# Patient Record
Sex: Male | Born: 1945 | Race: Black or African American | Hispanic: No | Marital: Married | Smoking: Never smoker
Health system: Southern US, Community
[De-identification: ages and names within clinical notes are randomized; demographics above are authoritative.]

## PROBLEM LIST (undated history)

## (undated) DIAGNOSIS — J189 Pneumonia, unspecified organism: Secondary | ICD-10-CM

## (undated) DIAGNOSIS — I2510 Atherosclerotic heart disease of native coronary artery without angina pectoris: Secondary | ICD-10-CM

## (undated) DIAGNOSIS — Z8719 Personal history of other diseases of the digestive system: Secondary | ICD-10-CM

## (undated) DIAGNOSIS — I639 Cerebral infarction, unspecified: Secondary | ICD-10-CM

## (undated) DIAGNOSIS — H409 Unspecified glaucoma: Secondary | ICD-10-CM

## (undated) DIAGNOSIS — F4310 Post-traumatic stress disorder, unspecified: Secondary | ICD-10-CM

## (undated) DIAGNOSIS — K279 Peptic ulcer, site unspecified, unspecified as acute or chronic, without hemorrhage or perforation: Secondary | ICD-10-CM

## (undated) DIAGNOSIS — M1990 Unspecified osteoarthritis, unspecified site: Secondary | ICD-10-CM

## (undated) DIAGNOSIS — F99 Mental disorder, not otherwise specified: Secondary | ICD-10-CM

## (undated) DIAGNOSIS — G4700 Insomnia, unspecified: Secondary | ICD-10-CM

## (undated) DIAGNOSIS — G4730 Sleep apnea, unspecified: Secondary | ICD-10-CM

## (undated) DIAGNOSIS — D869 Sarcoidosis, unspecified: Secondary | ICD-10-CM

## (undated) DIAGNOSIS — H15009 Unspecified scleritis, unspecified eye: Secondary | ICD-10-CM

## (undated) DIAGNOSIS — R7303 Prediabetes: Secondary | ICD-10-CM

## (undated) DIAGNOSIS — D369 Benign neoplasm, unspecified site: Secondary | ICD-10-CM

## (undated) DIAGNOSIS — I10 Essential (primary) hypertension: Secondary | ICD-10-CM

## (undated) HISTORY — DX: Unspecified osteoarthritis, unspecified site: M19.90

## (undated) HISTORY — DX: Sarcoidosis, unspecified: D86.9

## (undated) HISTORY — DX: Unspecified scleritis, unspecified eye: H15.009

## (undated) HISTORY — DX: Benign neoplasm, unspecified site: D36.9

## (undated) HISTORY — PX: CARDIAC CATHETERIZATION: SHX172

## (undated) HISTORY — DX: Personal history of other diseases of the digestive system: Z87.19

## (undated) HISTORY — DX: Insomnia, unspecified: G47.00

## (undated) HISTORY — DX: Peptic ulcer, site unspecified, unspecified as acute or chronic, without hemorrhage or perforation: K27.9

## (undated) HISTORY — DX: Post-traumatic stress disorder, unspecified: F43.10

---

## 1999-11-24 ENCOUNTER — Emergency Department (HOSPITAL_COMMUNITY): Admission: EM | Admit: 1999-11-24 | Discharge: 1999-11-24 | Payer: Self-pay

## 2001-01-09 DIAGNOSIS — Z8719 Personal history of other diseases of the digestive system: Secondary | ICD-10-CM

## 2001-01-09 HISTORY — DX: Personal history of other diseases of the digestive system: Z87.19

## 2001-01-26 ENCOUNTER — Ambulatory Visit (HOSPITAL_COMMUNITY): Admission: RE | Admit: 2001-01-26 | Discharge: 2001-01-26 | Payer: Self-pay | Admitting: Gastroenterology

## 2002-03-28 ENCOUNTER — Ambulatory Visit (HOSPITAL_COMMUNITY): Admission: RE | Admit: 2002-03-28 | Discharge: 2002-03-28 | Payer: Self-pay | Admitting: Internal Medicine

## 2002-06-22 ENCOUNTER — Encounter: Admission: RE | Admit: 2002-06-22 | Discharge: 2002-06-22 | Payer: Self-pay | Admitting: Rheumatology

## 2002-06-22 ENCOUNTER — Encounter: Payer: Self-pay | Admitting: Rheumatology

## 2002-10-11 DIAGNOSIS — D369 Benign neoplasm, unspecified site: Secondary | ICD-10-CM

## 2002-10-11 HISTORY — DX: Benign neoplasm, unspecified site: D36.9

## 2002-11-13 ENCOUNTER — Encounter: Payer: Self-pay | Admitting: Ophthalmology

## 2002-11-16 ENCOUNTER — Ambulatory Visit (HOSPITAL_COMMUNITY): Admission: RE | Admit: 2002-11-16 | Discharge: 2002-12-01 | Payer: Self-pay | Admitting: Ophthalmology

## 2002-11-16 ENCOUNTER — Encounter (INDEPENDENT_AMBULATORY_CARE_PROVIDER_SITE_OTHER): Payer: Self-pay | Admitting: Specialist

## 2003-07-31 ENCOUNTER — Encounter (INDEPENDENT_AMBULATORY_CARE_PROVIDER_SITE_OTHER): Payer: Self-pay | Admitting: Specialist

## 2003-07-31 ENCOUNTER — Ambulatory Visit (HOSPITAL_COMMUNITY): Admission: RE | Admit: 2003-07-31 | Discharge: 2003-07-31 | Payer: Self-pay | Admitting: Gastroenterology

## 2003-10-12 HISTORY — PX: EYE SURGERY: SHX253

## 2008-09-19 ENCOUNTER — Encounter: Admission: RE | Admit: 2008-09-19 | Discharge: 2008-09-19 | Payer: Self-pay | Admitting: Rheumatology

## 2009-10-07 ENCOUNTER — Encounter: Admission: RE | Admit: 2009-10-07 | Discharge: 2009-10-07 | Payer: Self-pay | Admitting: Rheumatology

## 2010-11-01 ENCOUNTER — Encounter: Payer: Self-pay | Admitting: Rheumatology

## 2011-02-08 ENCOUNTER — Encounter (HOSPITAL_BASED_OUTPATIENT_CLINIC_OR_DEPARTMENT_OTHER)
Admission: RE | Admit: 2011-02-08 | Discharge: 2011-02-08 | Disposition: A | Discharge status: Home / Self Care | Payer: BC Managed Care – PPO | Source: Ambulatory Visit | Attending: Orthopaedic Surgery | Admitting: Orthopaedic Surgery

## 2011-02-08 LAB — BASIC METABOLIC PANEL
BUN: 8 mg/dL (ref 6–23)
Calcium: 9.5 mg/dL (ref 8.4–10.5)
Chloride: 103 mEq/L (ref 96–112)
Creatinine, Ser: 0.64 mg/dL (ref 0.4–1.5)
GFR calc Af Amer: >60 mL/min (ref >60)
GFR calc non Af Amer: >60 mL/min (ref >60)
Glucose, Bld: 82 mg/dL (ref 70–99)
Potassium: 4.1 mEq/L (ref 3.5–5.1)
Sodium: 137 mEq/L (ref 135–145)

## 2011-02-09 ENCOUNTER — Ambulatory Visit (HOSPITAL_BASED_OUTPATIENT_CLINIC_OR_DEPARTMENT_OTHER)
Admission: RE | Admit: 2011-02-09 | Discharge: 2011-02-09 | Disposition: A | Discharge status: Home / Self Care | Payer: BC Managed Care – PPO | Source: Ambulatory Visit | Attending: Orthopaedic Surgery | Admitting: Orthopaedic Surgery

## 2011-02-09 DIAGNOSIS — E119 Type 2 diabetes mellitus without complications: Secondary | ICD-10-CM | POA: Insufficient documentation

## 2011-02-09 DIAGNOSIS — G4733 Obstructive sleep apnea (adult) (pediatric): Secondary | ICD-10-CM | POA: Insufficient documentation

## 2011-02-09 DIAGNOSIS — M2240 Chondromalacia patellae, unspecified knee: Secondary | ICD-10-CM | POA: Insufficient documentation

## 2011-02-09 DIAGNOSIS — Z01812 Encounter for preprocedural laboratory examination: Secondary | ICD-10-CM | POA: Insufficient documentation

## 2011-02-09 DIAGNOSIS — M129 Arthropathy, unspecified: Secondary | ICD-10-CM | POA: Insufficient documentation

## 2011-02-09 DIAGNOSIS — I10 Essential (primary) hypertension: Secondary | ICD-10-CM | POA: Insufficient documentation

## 2011-02-09 DIAGNOSIS — M23305 Other meniscus derangements, unspecified medial meniscus, unspecified knee: Secondary | ICD-10-CM | POA: Insufficient documentation

## 2011-02-09 DIAGNOSIS — Z01810 Encounter for preprocedural cardiovascular examination: Secondary | ICD-10-CM | POA: Insufficient documentation

## 2011-02-09 LAB — GLUCOSE, CAPILLARY: Glucose-Capillary: 103 mg/dL — ABNORMAL HIGH (ref 70–99)

## 2011-02-21 NOTE — Op Note (Signed)
[name], Derek [name] NO.:  [id number]  MEDICAL RECORD NO.:  [id number]         PATIENT TYPE:  CAMB  LOCATION:                                 FACILITY:  PHYSICIAN:  Lubertha Basque. Jennika Ringgold, M.D.DATE OF BIRTH:  06/05/1946  DATE OF PROCEDURE:  02/09/2011 DATE OF DISCHARGE:  02/09/2011                              OPERATIVE REPORT   PREOPERATIVE DIAGNOSES: 1. Left knee torn lateral meniscus. 2. Left knee chondromalacia.  POSTOPERATIVE DIAGNOSES: 1. Left knee torn lateral meniscus. 2. Left knee chondromalacia.  PROCEDURE: 1. Left knee partial lateral meniscectomy. 2. Left knee abrasion, chondroplasty medial. 3. Left knee synovectomy.  ANESTHESIA:  General and block.  ATTENDING SURGEON:  Lubertha Basque. Jerl Santos, MD  ASSISTANT:  Lindwood Qua, PA   INDICATIONS FOR PROCEDURE:  The patient is a 65 year old man with seronegative rheumatoid arthritis.  He has been aspirated and injected many times by his rheumatologist.  He persists with an effusion.  He has an MRI on record from several years back which does not show any meniscal injuries which shows things consistent with his underlying condition.  He has mechanical catching in his knee which locks and catches.  He has pain which limits his ability to walk and rest and at this point he is offered an arthroscopy.  Informed operative consent was obtained after discussion of possible complications including reaction to anesthesia and infection.  SUMMARY/FINDINGS/PROCEDURE:  Under general anesthesia and a block, a left knee arthroscopy was performed.  The suprapatellar pouch was benign except for an abundant collection of hypertrophied synovium.  He had some small cartilaginous loose bodies.  The patellofemoral joint looked relatively benign.  In the medial compartment, he had no evidence of meniscal injury, but did have some chondromalacia, grade 4 and a dime- sized area near the notch and a chondroplasty with  abrasion to bleeding bone in one tiny area was done.  The ACL was intact.  Lateral compartment exhibited a middle horn lateral meniscus tear addressed with 5% partial lateral meniscectomy.  He also had some grade 3 changes across most of the femur.  Again, there were areas of synovial hypertrophy throughout the joint and thorough synovectomy was done.  DESCRIPTION OF PROCEDURE:  The patient was taken to the operating suite where general anesthetic was applied without difficulty.  He was also given a block in the preanesthesia area.  He was positioned supine and prepped and draped in normal sterile fashion.  After administration of IV Kefzol, an arthroscopy of the left knee was performed through total of two portals.  Findings were as noted above.  Procedure consisted of the extensive synovectomy followed by the partial and lateral meniscectomy done with a basket and shaver in the lateral compartment. We then performed the abrasion chondroplasty medial as outlined above. The knee was thoroughly irrigated followed by placement of Marcaine with epinephrine and morphine plus some Depo-Medrol.  Adaptic was applied over the portals followed by dry gauze and loose Ace wrap.  Estimated blood loss and intraoperative fluids can be obtained from anesthesia records.  DISPOSITION:  The patient was extubated in the operating room and taken to recovery room in  stable addition.  He was to go home same-day and follow up in the office closely.  I will contact him by phone tonight.     Lubertha Basque Jerl Santos, M.D.     PGD/MEDQ  D:  02/09/2011  T:  02/09/2011  Job:  [id number]  Electronically Signed by Marcene Corning M.D. on 02/21/2011 11:53:23 AM

## 2011-02-26 NOTE — Procedures (Signed)
[facility]  Patient:    Derek [name], Derek [name]                   MRN: [id number] Proc. Date: 01/26/01 Attending:  Verlin Grills, M.D. CC:         Thora Lance, M.D.   Procedure Report  PROCEDURE:  Esophagogastroduodenoscopy.  REFERRING PHYSICIAN:  Dr. Kirby Funk.  INDICATION FOR PROCEDURE:  Mr. Derek [name] is a 65 year old male. He has a history of peptic ulcer disease in the past. He presented to Dr. Sherlynn Carbon office yesterday with epigastric dyspeptic type symptoms and passing melenic appearing stool. Mr. Tarte was taking nonsteroidal anti-inflammatory medication which has been stopped.  I discussed with the patient the complications associated with esophagogastroduodenoscopy including intestinal bleeding and intestinal perforation. The patient has signed the operative permit.  ENDOSCOPIST:  Verlin Grills, M.D.  PREMEDICATION:  Demerol 50 mg, Versed 7.5 mg.  ENDOSCOPE:  Olympus gastroscope.  DESCRIPTION OF PROCEDURE:  After obtaining informed consent, the patient was placed in the left lateral decubitus position. I administered intravenous Demerol and intravenous Versed to achieve conscious sedation for the procedure. The patients cardiac rhythm, oxygen saturation and blood pressure were monitored throughout the procedure and documented in the medical record. The Olympus video gastroscope was passed through the posterior hypopharynx into the proximal esophagus without difficulty. The hypopharynx, larynx, and vocal chords appeared normal.  ESOPHAGOSCOPY:  The proximal, mid and lower segments of the esophagus appeared normal.  GASTROSCOPY:  Retroflexed view of the gastric cardia and fundus was normal. The gastric body, antrum and pylorus appeared normal.  DUODENOSCOPY:  There is marked edema of the distal duodenal bulb. There is a 2 mm x 2 mm duodenal bulb ulcer in the distal duodenum with exudative base  and no visible vessel. The mid-distal duodenum appeared normal.  A biopsy was taken from the distal gastric antrum for CLOtest to rule out Helicobacter pylori antral gastritis.  ASSESSMENT:  Small duodenal bulb ulcer with exudative base probably secondary to nonsteroidal anti-inflammatory medications. CLOtest to rule out Helicobacter pylori antral gastritis pending.  RECOMMENDATIONS:  Begin proton pump inhibitor of choice to be used daily for 30 days to heal the duodenal ulcer. DD:  01/26/01 TD:  01/27/01 Job: [id number]

## 2011-02-26 NOTE — Op Note (Signed)
   [name], [name]                     ACCOUNT NO.:  [id number]   MEDICAL RECORD NO.:  [id number]                   PATIENT TYPE:  AMB   LOCATION:  ENDO                                 FACILITY:  [facility]   PHYSICIAN:  Danise Edge, M.D.                DATE OF BIRTH:  Apr 08, 1946   DATE OF PROCEDURE:  07/31/2003  DATE OF DISCHARGE:                                 OPERATIVE REPORT   PROCEDURE PERFORMED:  Colonoscopy and sigmoid polypectomy.   ENDOSCOPIST:  Charolett Bumpers, M.D.   INDICATIONS FOR PROCEDURE:  Mr. Derek [name] is a 65 year old male born  March 31, 1943.  Mr. Derek [name] is undergoing diagnostic to evaluate  intermittent hematochezia.   PREMEDICATION:  Versed 5 mg.  Demerol 50 mg.   DESCRIPTION OF PROCEDURE:  After obtaining informed consent, the patient was  placed in the left lateral decubitus position.  I administered intravenous  Demerol and intravenous Versed to achieve conscious sedation for the  procedure.  The patient's blood pressure, oxygen saturations and cardiac  rhythm were monitored throughout the procedure and documented in the medical  record.   Anal inspection was normal.  Digital rectal exam was normal.  I was unable  to examine the prostate.  The pediatric adjustable Olympus video colonoscope  was introduced into the rectum and easily advanced to cecum.  Colonic  preparation for the exam today was excellent.   Rectum:  Normal.   Sigmoid colon and descending colon:  At 40 cm from the anal verge, a 2 mm  sessile polyp was removed with the hot biopsy forceps.   Splenic flexure:  Normal.   Transverse colon:  Normal.   Hepatic flexure:  Normal.   Ascending colon:  Normal.   Cecum and ileocecal valve:  Normal.   ASSESSMENT:  A small polyp was removed from the distal sigmoid colon at 40  cm from the anal verge and submitted for pathological interpretation;  otherwise normal proctocolonoscopy to the cecum.    RECOMMENDATIONS:  If  sigmoid polyp returns neoplastic pathologically, Mr.  Derek [name] should undergo a repeat colonoscopy in five years.                                                Danise Edge, M.D.    MJ/MEDQ  D:  07/31/2003  T:  07/31/2003  Job:  [id number]   cc:   Thora Lance, M.D.  [address]  [place]  [place] [address]  Fax: [phone]

## 2011-02-26 NOTE — Op Note (Signed)
   Derek [name], Derek [name]                     ACCOUNT NO.:  [id number]   MEDICAL RECORD NO.:  [id number]                   PATIENT TYPE:  OIB   LOCATION:  NA                                   FACILITY:  [facility]   PHYSICIAN:  Salley Scarlet., M.D.         DATE OF BIRTH:  07-16-1946   DATE OF PROCEDURE:  DATE OF DISCHARGE:                                 OPERATIVE REPORT   PREOPERATIVE DIAGNOSIS:  Pterygium, cornea, left eye.   POSTOPERATIVE DIAGNOSIS:  Pterygium, cornea, left eye.   PROCEDURE:  Pterygium excision.   ANESTHESIA:  Local using Xylocaine 2% and Marcaine 0.75% and [place].   INDICATION FOR PROCEDURE:  This is a 65 year old gentleman who has chronic  irritation of the left eye.  He has a pterygium which stays chronically  inflamed and which encroaches onto approximately 3-4 mm of the cornea of the  left eye.  The patient requested that the pterygium be excised in hopes of  relieving some of the redness and irritation.  The patient is therefore  admitted at this time for that purpose.   DESCRIPTION OF PROCEDURE:  Under the influence of IV sedation, a Van Lint  akinesia and retrobulbar anesthesia were given.  The patient was prepped and  draped in the usual manner.  The lid speculum was then inserted under the  upper and lower lid of the left eye.  The head of the pterygium was  infiltrated with several cubic centimeters of the Xylocaine mixture.  Then  using sharp and blunt dissection, a curvilinear incision was made in the  cornea anterior to the incision of the pterygium and the pterygium was  excised in toto without difficulty.  The episcleral tissue was cauterized so  as to achieve hemostasis.  The conjunctiva was then closed using two  interrupted sutures of 8-0 Vicryl in such a fashion as to leave the bare  sclera immediately adjacent to the cornea.  Maxitrol ophthalmic ointment and  a pressure patch were applied.  The patient tolerated the procedure well  and  was discharged to the postanesthesia recovery room in satisfactory  condition.  He is instructed to rest today, to take Vicodin every four hours  as needed for pain, and to see me in the office tomorrow for further  evaluation.   DISCHARGE DIAGNOSIS:  Pterygium of cornea, left eye.                                               Salley Scarlet., M.D.    TB/MEDQ  D:  11/17/2002  T:  11/17/2002  Job:  [id number]

## 2012-05-05 ENCOUNTER — Other Ambulatory Visit: Payer: Self-pay | Admitting: Orthopaedic Surgery

## 2012-05-30 ENCOUNTER — Encounter (HOSPITAL_COMMUNITY): Payer: Self-pay | Admitting: Respiratory Therapy

## 2012-06-06 ENCOUNTER — Encounter (HOSPITAL_COMMUNITY)
Admission: RE | Admit: 2012-06-06 | Discharge: 2012-06-06 | Disposition: A | Discharge status: Home / Self Care | Payer: BC Managed Care – PPO | Source: Ambulatory Visit | Attending: Orthopaedic Surgery | Admitting: Orthopaedic Surgery

## 2012-06-06 ENCOUNTER — Encounter (HOSPITAL_COMMUNITY): Payer: Self-pay

## 2012-06-06 HISTORY — DX: Sleep apnea, unspecified: G47.30

## 2012-06-06 HISTORY — DX: Essential (primary) hypertension: I10

## 2012-06-06 HISTORY — DX: Unspecified osteoarthritis, unspecified site: M19.90

## 2012-06-06 LAB — URINALYSIS, ROUTINE W REFLEX MICROSCOPIC
Bilirubin Urine: NEGATIVE
Glucose, UA: NEGATIVE mg/dL
Hgb urine dipstick: NEGATIVE
Specific Gravity, Urine: 1.023 (ref 1.005–1.030)
Urobilinogen, UA: 1 mg/dL (ref 0.0–1.0)
pH: 5 (ref 5.0–8.0)

## 2012-06-06 LAB — CBC WITH DIFFERENTIAL/PLATELET
Eosinophils Absolute: 0.3 10*3/uL (ref 0.0–0.7)
Eosinophils Relative: 4 % (ref 0–5)
HCT: 42 % (ref 39.0–52.0)
Hemoglobin: 14.5 g/dL (ref 13.0–17.0)
Lymphs Abs: 2.5 10*3/uL (ref 0.7–4.0)
MCH: 33.3 pg (ref 26.0–34.0)
MCV: 96.6 fL (ref 78.0–100.0)
Monocytes Absolute: 0.6 10*3/uL (ref 0.1–1.0)
Monocytes Relative: 6 % (ref 3–12)
RBC: 4.35 MIL/uL (ref 4.22–5.81)

## 2012-06-06 LAB — APTT: aPTT: 31 seconds (ref 24–37)

## 2012-06-06 LAB — BASIC METABOLIC PANEL
Chloride: 100 mEq/L (ref 96–112)
Creatinine, Ser: 0.57 mg/dL (ref 0.50–1.35)
GFR calc Af Amer: >90 mL/min (ref >90)
Potassium: 3.8 mEq/L (ref 3.5–5.1)

## 2012-06-06 LAB — PROTIME-INR: Prothrombin Time: 13.7 seconds (ref 11.6–15.2)

## 2012-06-06 LAB — TYPE AND SCREEN: Antibody Screen: NEGATIVE

## 2012-06-06 MED ORDER — CHLORHEXIDINE GLUCONATE 4 % EX LIQD: 60.0000 mL | Freq: Once | CUTANEOUS | Status: DC

## 2012-06-06 NOTE — Pre-Procedure Instructions (Addendum)
[address] BRIYAN KLEVEN  06/06/2012   Your procedure is scheduled on:  06/13/12 ** Report to [facility] at 1030 AM.  Call this number if you have problems the morning of surgery: [phone]   Remember:   Do not eat food or drink:After Midnight.    Take these medicines the morning of surgery with A SIP OF WATER: tylenol. Allopurinol, prednisone, prazosin  STOP fish oil, plaqueni 8/29l,   Do not wear jewelry, make-up or nail polish.  Do not wear lotions, powders, or perfumes. You may wear deodorant.  Do not shave 48 hours prior to surgery. Men may shave face and neck.  Do not bring valuables to the hospital.  Contacts, dentures or bridgework may not be worn into surgery.  Leave suitcase in the car. After surgery it may be brought to your room.  For patients admitted to the hospital, checkout time is 11:00 AM the day of discharge.   Patients discharged the day of surgery will not be allowed to drive home.  Name and phone number of your driver: annie spouse [phone]  Special Instructions: Incentive Spirometry - Practice and bring it with you on the day of surgery. and CHG Shower Use Special Wash: 1/2 bottle night before surgery and 1/2 bottle morning of surgery.   Please read over the following fact sheets that you were given: Pain Booklet, Coughing and Deep Breathing, Blood Transfusion Information, MRSA Information and Surgical Site Infection Prevention, total joint packet

## 2012-06-06 NOTE — Progress Notes (Signed)
preop ekg, ? Echo done and req'd from dr Jonny Ruiz griffin

## 2012-06-07 NOTE — Consult Note (Signed)
Anesthesia chart review: Patient is a 66 year old male scheduled for right hip arthoplasty on 06/13/12 by Dr. Jerl Santos.  History includes HTN, OSA, DM2 (A1C on 05/23/12 was 5.7), RA, current BMI 30.  PCP is Dr. Kirby Funk.  Patient was just seen for a CPE on 05/23/12.    EKG on 05/23/12 [facility]) showed NSR, LAE, inferior and anteroseptal infarct (age undetermined).  Overall, it appears stable since at least 02/08/11.  Following patient's EKG, Dr. Valentina Lucks ordered an echo that was done on 05/25/12 and showed mild LVH, EF 60-65%, borderline left atrial enlargement, mild MR, aortic valve sclerosis but opened well, mild TR, mild pulmonary valve regurgitation, findings suggestive of normal diastolic dysfunction without elevated left atrial pressure.  CXR on 06/06/12 showed no acute disease.  Labs noted.    Anticipate he can proceed as planned.  Shonna Chock, PA-C

## 2012-06-10 NOTE — H&P (Signed)
Derek [name] is an 65 y.o. male.   Chief Complaint: right hip pain HPI: Derek [name] is suffering from terrible right hip pain for multiple months duration.  Last year he received an injection of cortisone which helped him only for short while.  His right hip pain his back despite trying oral anti-inflammatory medicines.  He did have to stop oral anti-inflammatory medicines due to gastric ulcer.  He is in constant and severe pain in things are getting worse.  He is unable to walk without severe pain.  X-rays reveal significant degeneration bone-on-bone arthritis of the right hip.  We have discussed with him proceeding with a right hip replacement to help relieve pain and increased function.  Past Medical History  Diagnosis Date  . Hypertension   . Diabetes mellitus   . Sleep apnea     cpap with O2 4L for 4 yrs  . Arthritis     Past Surgical History  Procedure Date  . Eye surgery 05    growth lft    No family history on file. Social History:  reports that he has never smoked. He does not have any smokeless tobacco history on file. He reports that he drinks alcohol. He reports that he does not use illicit drugs.  Allergies: No Known Allergies  No prescriptions prior to admission  medications: Oral antidiabetic agents, allopurinol, lithium  No results found for this or any previous visit (from the past 48 hour(s)). No results found.  Review of Systems  Constitutional: Negative.   HENT: Negative.   Eyes: Negative.   Respiratory: Negative.   Cardiovascular: Negative.        Hypertension  Gastrointestinal: Negative.   Genitourinary: Negative.   Musculoskeletal: Negative.        History of gout  Skin: Negative.   Neurological: Negative.   Endo/Heme/Allergies: Negative.        Diabetes  Psychiatric/Behavioral: Negative.     There were no vitals taken for this visit. Physical Exam  Constitutional: He appears well-nourished.  HENT:  Head: Atraumatic.  Eyes: EOM are  normal.  Neck: Neck supple.  Cardiovascular: Normal rate.   Respiratory: Effort normal.  GI: Soft.  Musculoskeletal:       Right hip motion is extremely painful and limited.  Leg lengths grossly equal.  Straight leg raising causes mild low back pain.  Sensory and motor function are normal.  He has very limited rotation right hip.  Neurological: He is alert.  Skin: Skin is warm.  Psychiatric: He has a normal mood and affect.     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  We have also discussed his hospitalization and need for postoperative physical therapy.  Aliyha Fornes R 06/10/2012, 1:54 PM

## 2012-06-12 MED ORDER — CEFAZOLIN SODIUM-DEXTROSE 2-3 GM-% IV SOLR
2.0000 g | INTRAVENOUS | Status: AC
  Administered 2012-06-13: 2 g via INTRAVENOUS
  Filled 2012-06-12: qty 50

## 2012-06-13 ENCOUNTER — Ambulatory Visit (HOSPITAL_COMMUNITY): Payer: BC Managed Care – PPO

## 2012-06-13 ENCOUNTER — Encounter (HOSPITAL_COMMUNITY): Payer: Self-pay | Admitting: Vascular Surgery

## 2012-06-13 ENCOUNTER — Inpatient Hospital Stay (HOSPITAL_COMMUNITY)
Admission: RE | Admit: 2012-06-13 | Discharge: 2012-06-15 | DRG: 818 | Disposition: A | Discharge status: Home Health | Payer: BC Managed Care – PPO | Source: Ambulatory Visit | Attending: Orthopaedic Surgery | Admitting: Orthopaedic Surgery

## 2012-06-13 ENCOUNTER — Encounter (HOSPITAL_COMMUNITY)
Admission: RE | Disposition: A | Discharge status: Home Health | Payer: Self-pay | Source: Ambulatory Visit | Attending: Orthopaedic Surgery

## 2012-06-13 ENCOUNTER — Inpatient Hospital Stay (HOSPITAL_COMMUNITY): Payer: BC Managed Care – PPO

## 2012-06-13 ENCOUNTER — Ambulatory Visit (HOSPITAL_COMMUNITY): Payer: BC Managed Care – PPO | Admitting: Vascular Surgery

## 2012-06-13 DIAGNOSIS — Z79899 Other long term (current) drug therapy: Secondary | ICD-10-CM

## 2012-06-13 DIAGNOSIS — Z01812 Encounter for preprocedural laboratory examination: Secondary | ICD-10-CM

## 2012-06-13 DIAGNOSIS — Z01818 Encounter for other preprocedural examination: Secondary | ICD-10-CM

## 2012-06-13 DIAGNOSIS — M109 Gout, unspecified: Secondary | ICD-10-CM | POA: Diagnosis present

## 2012-06-13 DIAGNOSIS — I10 Essential (primary) hypertension: Secondary | ICD-10-CM | POA: Diagnosis present

## 2012-06-13 DIAGNOSIS — Z01811 Encounter for preprocedural respiratory examination: Secondary | ICD-10-CM

## 2012-06-13 DIAGNOSIS — E119 Type 2 diabetes mellitus without complications: Secondary | ICD-10-CM | POA: Diagnosis present

## 2012-06-13 DIAGNOSIS — F4310 Post-traumatic stress disorder, unspecified: Secondary | ICD-10-CM | POA: Diagnosis present

## 2012-06-13 DIAGNOSIS — M169 Osteoarthritis of hip, unspecified: Principal | ICD-10-CM | POA: Diagnosis present

## 2012-06-13 DIAGNOSIS — M1610 Unilateral primary osteoarthritis, unspecified hip: Principal | ICD-10-CM | POA: Diagnosis present

## 2012-06-13 DIAGNOSIS — G4733 Obstructive sleep apnea (adult) (pediatric): Secondary | ICD-10-CM | POA: Diagnosis present

## 2012-06-13 DIAGNOSIS — Z7982 Long term (current) use of aspirin: Secondary | ICD-10-CM

## 2012-06-13 HISTORY — DX: Mental disorder, not otherwise specified: F99

## 2012-06-13 HISTORY — PX: TOTAL HIP ARTHROPLASTY: SHX124

## 2012-06-13 LAB — GLUCOSE, CAPILLARY
Glucose-Capillary: 118 mg/dL — ABNORMAL HIGH (ref 70–99)
Glucose-Capillary: 121 mg/dL — ABNORMAL HIGH (ref 70–99)

## 2012-06-13 SURGERY — ARTHROPLASTY, HIP, TOTAL, ANTERIOR APPROACH
Anesthesia: General | Site: Hip | Laterality: Right | Wound class: Clean

## 2012-06-13 MED ORDER — LACTATED RINGERS IV SOLN
INTRAVENOUS | Status: DC
  Administered 2012-06-13: 12:00:00 via INTRAVENOUS

## 2012-06-13 MED ORDER — PRAZOSIN HCL 2 MG PO CAPS
2.0000 mg | ORAL_CAPSULE | Freq: Every day | ORAL | Status: DC
  Administered 2012-06-13 – 2012-06-14 (×2): 2 mg via ORAL
  Filled 2012-06-13 (×3): qty 1

## 2012-06-13 MED ORDER — METOCLOPRAMIDE HCL 10 MG PO TABS: 5.0000 mg | ORAL_TABLET | Freq: Three times a day (TID) | ORAL | Status: DC | PRN

## 2012-06-13 MED ORDER — METOCLOPRAMIDE HCL 5 MG/ML IJ SOLN: 5.0000 mg | Freq: Three times a day (TID) | INTRAMUSCULAR | Status: DC | PRN

## 2012-06-13 MED ORDER — MIDAZOLAM HCL 5 MG/5ML IJ SOLN
INTRAMUSCULAR | Status: DC | PRN
  Administered 2012-06-13: 2 mg via INTRAVENOUS

## 2012-06-13 MED ORDER — 0.9 % SODIUM CHLORIDE (POUR BTL) OPTIME
TOPICAL | Status: DC | PRN
  Administered 2012-06-13: 1000 mL

## 2012-06-13 MED ORDER — LIDOCAINE HCL (CARDIAC) 20 MG/ML IV SOLN
INTRAVENOUS | Status: DC | PRN
  Administered 2012-06-13: 100 mg via INTRAVENOUS

## 2012-06-13 MED ORDER — PHENYLEPHRINE HCL 10 MG/ML IJ SOLN
INTRAMUSCULAR | Status: DC | PRN
  Administered 2012-06-13: 40 ug via INTRAVENOUS
  Administered 2012-06-13 (×2): 80 ug via INTRAVENOUS

## 2012-06-13 MED ORDER — LACTATED RINGERS IV SOLN
INTRAVENOUS | Status: DC | PRN
  Administered 2012-06-13 (×3): via INTRAVENOUS

## 2012-06-13 MED ORDER — MIDAZOLAM HCL 2 MG/2ML IJ SOLN: 1.0000 mg | INTRAMUSCULAR | Status: DC | PRN

## 2012-06-13 MED ORDER — INSULIN ASPART 100 UNIT/ML ~~LOC~~ SOLN: 0.0000 [IU] | Freq: Every day | SUBCUTANEOUS | Status: DC

## 2012-06-13 MED ORDER — HYDROXYCHLOROQUINE SULFATE 200 MG PO TABS
400.0000 mg | ORAL_TABLET | Freq: Every day | ORAL | Status: DC
  Administered 2012-06-14 – 2012-06-15 (×2): 400 mg via ORAL
  Filled 2012-06-13 (×2): qty 2

## 2012-06-13 MED ORDER — EPHEDRINE SULFATE 50 MG/ML IJ SOLN
INTRAMUSCULAR | Status: DC | PRN
  Administered 2012-06-13: 10 mg via INTRAVENOUS

## 2012-06-13 MED ORDER — ONDANSETRON HCL 4 MG/2ML IJ SOLN
INTRAMUSCULAR | Status: DC | PRN
  Administered 2012-06-13: 4 mg via INTRAVENOUS

## 2012-06-13 MED ORDER — NEOSTIGMINE METHYLSULFATE 1 MG/ML IJ SOLN
INTRAMUSCULAR | Status: DC | PRN
  Administered 2012-06-13: 4 mg via INTRAVENOUS

## 2012-06-13 MED ORDER — ONDANSETRON HCL 4 MG/2ML IJ SOLN: 4.0000 mg | Freq: Four times a day (QID) | INTRAMUSCULAR | Status: DC | PRN

## 2012-06-13 MED ORDER — PREDNISONE 5 MG PO TABS
5.0000 mg | ORAL_TABLET | Freq: Every day | ORAL | Status: DC
  Administered 2012-06-14 – 2012-06-15 (×2): 5 mg via ORAL
  Filled 2012-06-13 (×3): qty 1

## 2012-06-13 MED ORDER — FUROSEMIDE 20 MG PO TABS
20.0000 mg | ORAL_TABLET | Freq: Every day | ORAL | Status: DC
  Administered 2012-06-14 – 2012-06-15 (×2): 20 mg via ORAL
  Filled 2012-06-13 (×2): qty 1

## 2012-06-13 MED ORDER — LISINOPRIL 10 MG PO TABS
10.0000 mg | ORAL_TABLET | Freq: Every day | ORAL | Status: DC
  Administered 2012-06-14 – 2012-06-15 (×2): 10 mg via ORAL
  Filled 2012-06-13 (×2): qty 1

## 2012-06-13 MED ORDER — GLIMEPIRIDE 1 MG PO TABS
0.5000 mg | ORAL_TABLET | Freq: Every day | ORAL | Status: DC
  Administered 2012-06-14 – 2012-06-15 (×2): 0.5 mg via ORAL
  Filled 2012-06-13 (×3): qty 0.5

## 2012-06-13 MED ORDER — CEFAZOLIN SODIUM-DEXTROSE 2-3 GM-% IV SOLR
2.0000 g | Freq: Four times a day (QID) | INTRAVENOUS | Status: AC
  Administered 2012-06-13 (×2): 2 g via INTRAVENOUS
  Filled 2012-06-13 (×2): qty 50

## 2012-06-13 MED ORDER — VECURONIUM BROMIDE 10 MG IV SOLR
INTRAVENOUS | Status: DC | PRN
  Administered 2012-06-13: 1 mg via INTRAVENOUS
  Administered 2012-06-13: 2 mg via INTRAVENOUS
  Administered 2012-06-13: 1 mg via INTRAVENOUS
  Administered 2012-06-13: 2 mg via INTRAVENOUS
  Administered 2012-06-13: 1 mg via INTRAVENOUS

## 2012-06-13 MED ORDER — INSULIN ASPART 100 UNIT/ML ~~LOC~~ SOLN
0.0000 [IU] | Freq: Three times a day (TID) | SUBCUTANEOUS | Status: DC
  Administered 2012-06-14: 3 [IU] via SUBCUTANEOUS
  Administered 2012-06-14 – 2012-06-15 (×2): 4 [IU] via SUBCUTANEOUS

## 2012-06-13 MED ORDER — HYDROMORPHONE HCL PF 1 MG/ML IJ SOLN: 0.2500 mg | INTRAMUSCULAR | Status: DC | PRN

## 2012-06-13 MED ORDER — ONDANSETRON HCL 4 MG PO TABS: 4.0000 mg | ORAL_TABLET | Freq: Four times a day (QID) | ORAL | Status: DC | PRN

## 2012-06-13 MED ORDER — HYDROXYCHLOROQUINE SULFATE 200 MG PO TABS
200.0000 mg | ORAL_TABLET | Freq: Every day | ORAL | Status: DC
  Administered 2012-06-13 – 2012-06-14 (×2): 200 mg via ORAL
  Filled 2012-06-13 (×3): qty 1

## 2012-06-13 MED ORDER — ACETAMINOPHEN 325 MG PO TABS: 650.0000 mg | ORAL_TABLET | Freq: Four times a day (QID) | ORAL | Status: DC | PRN

## 2012-06-13 MED ORDER — METHOTREXATE 2.5 MG PO TABS: 12.5000 mg | ORAL_TABLET | ORAL | Status: DC

## 2012-06-13 MED ORDER — ACETAMINOPHEN 650 MG RE SUPP: 650.0000 mg | Freq: Four times a day (QID) | RECTAL | Status: DC | PRN

## 2012-06-13 MED ORDER — PHENOL 1.4 % MT LIQD: 1.0000 | OROMUCOSAL | Status: DC | PRN

## 2012-06-13 MED ORDER — HYDROMORPHONE HCL PF 1 MG/ML IJ SOLN: 0.5000 mg | INTRAMUSCULAR | Status: DC | PRN

## 2012-06-13 MED ORDER — TRAZODONE 25 MG HALF TABLET
25.0000 mg | ORAL_TABLET | Freq: Every day | ORAL | Status: DC
  Administered 2012-06-13 – 2012-06-14 (×2): 25 mg via ORAL
  Filled 2012-06-13 (×3): qty 1

## 2012-06-13 MED ORDER — ASPIRIN EC 325 MG PO TBEC
325.0000 mg | DELAYED_RELEASE_TABLET | Freq: Two times a day (BID) | ORAL | Status: DC
  Administered 2012-06-14 – 2012-06-15 (×3): 325 mg via ORAL
  Filled 2012-06-13 (×5): qty 1

## 2012-06-13 MED ORDER — FENTANYL CITRATE 0.05 MG/ML IJ SOLN: 50.0000 ug | INTRAMUSCULAR | Status: DC | PRN

## 2012-06-13 MED ORDER — METHOCARBAMOL 500 MG PO TABS
500.0000 mg | ORAL_TABLET | Freq: Four times a day (QID) | ORAL | Status: DC | PRN
  Administered 2012-06-13: 500 mg via ORAL

## 2012-06-13 MED ORDER — HYDROXYCHLOROQUINE SULFATE 200 MG PO TABS: 200.0000 mg | ORAL_TABLET | Freq: Two times a day (BID) | ORAL | Status: DC

## 2012-06-13 MED ORDER — METFORMIN HCL 500 MG PO TABS
500.0000 mg | ORAL_TABLET | Freq: Two times a day (BID) | ORAL | Status: DC
  Administered 2012-06-14 – 2012-06-15 (×2): 500 mg via ORAL
  Filled 2012-06-13 (×6): qty 1

## 2012-06-13 MED ORDER — PROPOFOL 10 MG/ML IV EMUL
INTRAVENOUS | Status: DC | PRN
  Administered 2012-06-13: 130 mg via INTRAVENOUS

## 2012-06-13 MED ORDER — ROCURONIUM BROMIDE 100 MG/10ML IV SOLN
INTRAVENOUS | Status: DC | PRN
  Administered 2012-06-13: 50 mg via INTRAVENOUS

## 2012-06-13 MED ORDER — OXYCODONE HCL 5 MG PO TABS
5.0000 mg | ORAL_TABLET | ORAL | Status: DC | PRN
  Administered 2012-06-13 – 2012-06-15 (×2): 10 mg via ORAL
  Filled 2012-06-13: qty 2

## 2012-06-13 MED ORDER — MENTHOL 3 MG MT LOZG: 1.0000 | LOZENGE | OROMUCOSAL | Status: DC | PRN

## 2012-06-13 MED ORDER — METFORMIN HCL 500 MG PO TABS
500.0000 mg | ORAL_TABLET | Freq: Two times a day (BID) | ORAL | Status: DC
  Filled 2012-06-13: qty 1

## 2012-06-13 MED ORDER — OXYCODONE HCL 5 MG PO TABS
ORAL_TABLET | ORAL | Status: AC
  Filled 2012-06-13: qty 2

## 2012-06-13 MED ORDER — FENTANYL CITRATE 0.05 MG/ML IJ SOLN
INTRAMUSCULAR | Status: DC | PRN
  Administered 2012-06-13: 100 ug via INTRAVENOUS
  Administered 2012-06-13 (×3): 50 ug via INTRAVENOUS

## 2012-06-13 MED ORDER — METHOCARBAMOL 100 MG/ML IJ SOLN
500.0000 mg | Freq: Four times a day (QID) | INTRAVENOUS | Status: DC | PRN
  Filled 2012-06-13: qty 5

## 2012-06-13 MED ORDER — METHOCARBAMOL 500 MG PO TABS
ORAL_TABLET | ORAL | Status: AC
  Filled 2012-06-13: qty 1

## 2012-06-13 MED ORDER — ALUM & MAG HYDROXIDE-SIMETH 200-200-20 MG/5ML PO SUSP: 30.0000 mL | ORAL | Status: DC | PRN

## 2012-06-13 MED ORDER — LACTATED RINGERS IV SOLN: INTRAVENOUS | Status: DC

## 2012-06-13 MED ORDER — PROMETHAZINE HCL 25 MG/ML IJ SOLN: 6.2500 mg | INTRAMUSCULAR | Status: DC | PRN

## 2012-06-13 MED ORDER — VITAMIN D3 25 MCG (1000 UNIT) PO TABS
1000.0000 [IU] | ORAL_TABLET | Freq: Every day | ORAL | Status: DC
  Administered 2012-06-13 – 2012-06-15 (×3): 1000 [IU] via ORAL
  Filled 2012-06-13 (×3): qty 1

## 2012-06-13 MED ORDER — FOLIC ACID 1 MG PO TABS
1.0000 mg | ORAL_TABLET | Freq: Every day | ORAL | Status: DC
  Administered 2012-06-14 – 2012-06-15 (×2): 1 mg via ORAL
  Filled 2012-06-13 (×3): qty 1

## 2012-06-13 MED ORDER — ALLOPURINOL 300 MG PO TABS
300.0000 mg | ORAL_TABLET | Freq: Every day | ORAL | Status: DC
  Administered 2012-06-14 – 2012-06-15 (×2): 300 mg via ORAL
  Filled 2012-06-13 (×2): qty 1

## 2012-06-13 MED ORDER — FERROUS SULFATE 325 (65 FE) MG PO TABS
325.0000 mg | ORAL_TABLET | Freq: Every day | ORAL | Status: DC
  Administered 2012-06-14 – 2012-06-15 (×2): 325 mg via ORAL
  Filled 2012-06-13 (×3): qty 1

## 2012-06-13 MED ORDER — GLYCOPYRROLATE 0.2 MG/ML IJ SOLN
INTRAMUSCULAR | Status: DC | PRN
  Administered 2012-06-13: .7 mg via INTRAVENOUS

## 2012-06-13 SURGICAL SUPPLY — 55 items
BLADE SAW SGTL 18X1.27X75 (BLADE) ×2 IMPLANT
BLADE SURG 10 STRL SS (BLADE) ×2 IMPLANT
BLADE SURG ROTATE 9660 (MISCELLANEOUS) ×1 IMPLANT
BRUSH FEMORAL CANAL (MISCELLANEOUS) IMPLANT
CELLS DAT CNTRL 66122 CELL SVR (MISCELLANEOUS) ×1 IMPLANT
CLOTH BEACON ORANGE TIMEOUT ST (SAFETY) ×2 IMPLANT
COVER BACK TABLE 24X17X13 BIG (DRAPES) IMPLANT
COVER SURGICAL LIGHT HANDLE (MISCELLANEOUS) ×2 IMPLANT
DRAPE C-ARM 42X72 X-RAY (DRAPES) ×2 IMPLANT
DRAPE STERI IOBAN 125X83 (DRAPES) ×2 IMPLANT
DRAPE U-SHAPE 47X51 STRL (DRAPES) ×6 IMPLANT
DRSG MEPILEX BORDER 4X12 (GAUZE/BANDAGES/DRESSINGS) IMPLANT
DRSG MEPILEX BORDER 4X8 (GAUZE/BANDAGES/DRESSINGS) ×2 IMPLANT
DURAPREP 26ML APPLICATOR (WOUND CARE) ×2 IMPLANT
ELECT BLADE 4.0 EZ CLEAN MEGAD (MISCELLANEOUS)
ELECT BLADE TIP CTD 4 INCH (ELECTRODE) ×2 IMPLANT
ELECT CAUTERY BLADE 6.4 (BLADE) ×2 IMPLANT
ELECT REM PT RETURN 9FT ADLT (ELECTROSURGICAL) ×2
ELECTRODE BLDE 4.0 EZ CLN MEGD (MISCELLANEOUS) IMPLANT
ELECTRODE REM PT RTRN 9FT ADLT (ELECTROSURGICAL) ×1 IMPLANT
ELIMINATOR HOLE APEX DEPUY (Hips) ×1 IMPLANT
FACESHIELD LNG OPTICON STERILE (SAFETY) ×4 IMPLANT
GAUZE XEROFORM 1X8 LF (GAUZE/BANDAGES/DRESSINGS) ×2 IMPLANT
GLOVE BIO SURGEON STRL SZ8 (GLOVE) ×2 IMPLANT
GLOVE BIO SURGEON STRL SZ8.5 (GLOVE) ×1 IMPLANT
GLOVE BIOGEL PI IND STRL 8 (GLOVE) ×1 IMPLANT
GLOVE BIOGEL PI IND STRL 8.5 (GLOVE) ×1 IMPLANT
GLOVE BIOGEL PI INDICATOR 8 (GLOVE) ×1
GLOVE BIOGEL PI INDICATOR 8.5 (GLOVE)
GOWN PREVENTION PLUS LG XLONG (DISPOSABLE) IMPLANT
GOWN STRL NON-REIN LRG LVL3 (GOWN DISPOSABLE) ×4 IMPLANT
GOWN STRL REIN XL XLG (GOWN DISPOSABLE) ×2 IMPLANT
KIT BASIN OR (CUSTOM PROCEDURE TRAY) ×2 IMPLANT
KIT ROOM TURNOVER OR (KITS) ×2 IMPLANT
MANIFOLD NEPTUNE II (INSTRUMENTS) ×2 IMPLANT
NS IRRIG 1000ML POUR BTL (IV SOLUTION) ×2 IMPLANT
PACK TOTAL JOINT (CUSTOM PROCEDURE TRAY) ×2 IMPLANT
PAD ARMBOARD 7.5X6 YLW CONV (MISCELLANEOUS) ×4 IMPLANT
RETRACTOR WND ALEXIS 18 MED (MISCELLANEOUS) ×1 IMPLANT
RTRCTR WOUND ALEXIS 18CM MED (MISCELLANEOUS) ×2
SPONGE LAP 18X18 X RAY DECT (DISPOSABLE) ×2 IMPLANT
SPONGE LAP 4X18 X RAY DECT (DISPOSABLE) IMPLANT
STAPLER VISISTAT 35W (STAPLE) ×2 IMPLANT
SUT ETHIBOND NAB CT1 #1 30IN (SUTURE) ×4 IMPLANT
SUT VIC AB 0 CT1 27 (SUTURE)
SUT VIC AB 0 CT1 27XBRD ANBCTR (SUTURE) IMPLANT
SUT VIC AB 1 CT1 27 (SUTURE) ×2
SUT VIC AB 1 CT1 27XBRD ANBCTR (SUTURE) ×1 IMPLANT
SUT VIC AB 2-0 CT1 27 (SUTURE) ×2
SUT VIC AB 2-0 CT1 TAPERPNT 27 (SUTURE) ×1 IMPLANT
SUT VLOC 180 0 24IN GS25 (SUTURE) ×2 IMPLANT
TOWEL OR 17X24 6PK STRL BLUE (TOWEL DISPOSABLE) ×2 IMPLANT
TOWEL OR 17X26 10 PK STRL BLUE (TOWEL DISPOSABLE) ×4 IMPLANT
TRAY FOLEY CATH 14FR (SET/KITS/TRAYS/PACK) IMPLANT
WATER STERILE IRR 1000ML POUR (IV SOLUTION) ×4 IMPLANT

## 2012-06-13 NOTE — Interval H&P Note (Signed)
History and Physical Interval Note:  06/13/2012 11:20 AM  Derek [name]  has presented today for surgery, with the diagnosis of RIGHT HIP DEGENERATIVE JOINT DISEASE  The various methods of treatment have been discussed with the patient and family. After consideration of risks, benefits and other options for treatment, the patient has consented to  Procedure(s) (LRB) with comments: TOTAL HIP ARTHROPLASTY ANTERIOR APPROACH (Right) as a surgical intervention .  The patient's history has been reviewed, patient examined, no change in status, stable for surgery.  I have reviewed the patient's chart and labs.  Questions were answered to the patient's satisfaction.     Katheline Brendlinger G

## 2012-06-13 NOTE — Preoperative (Signed)
Beta Blockers   Reason not to administer Beta Blockers:Not Applicable. No home beta blockers 

## 2012-06-13 NOTE — Anesthesia Postprocedure Evaluation (Signed)
  Anesthesia Post-op Note  Patient: Derek [name]  Procedure(s) Performed: Procedure(s) (LRB) with comments: TOTAL HIP ARTHROPLASTY ANTERIOR APPROACH (Right) - right anterior approach total hip arthroplasty  Patient Location: PACU  Anesthesia Type: General  Level of Consciousness: awake  Airway and Oxygen Therapy: Patient Spontanous Breathing  Post-op Pain: mild  Post-op Assessment: Post-op Vital signs reviewed, Patient's Cardiovascular Status Stable, Respiratory Function Stable, Patent Airway, No signs of Nausea or vomiting and Pain level controlled  Post-op Vital Signs: stable  Complications: No apparent anesthesia complications

## 2012-06-13 NOTE — Anesthesia Procedure Notes (Signed)
Procedure Name: Intubation Date/Time: 06/13/2012 12:31 PM Performed by: Garen Lah Pre-anesthesia Checklist: Patient identified, Timeout performed, Emergency Drugs available, Suction available and Patient being monitored Patient Re-evaluated:Patient Re-evaluated prior to inductionOxygen Delivery Method: Circle system utilized Preoxygenation: Pre-oxygenation with 100% oxygen Intubation Type: IV induction Ventilation: Mask ventilation without difficulty and Oral airway inserted - appropriate to patient size Laryngoscope Size: Mac and 3 Grade View: Grade III Tube type: Oral Tube size: 8.0 mm Number of attempts: 1 Airway Equipment and Method: Stylet Placement Confirmation: ETT inserted through vocal cords under direct vision,  breath sounds checked- equal and bilateral and positive ETCO2 Secured at: 24 cm Tube secured with: Tape Dental Injury: Teeth and Oropharynx as per pre-operative assessment  Difficulty Due To: Difficult Airway- due to anterior larynx, Difficult Airway- due to reduced neck mobility, Difficult Airway- due to large tongue and Difficulty was anticipated

## 2012-06-13 NOTE — Transfer of Care (Signed)
Immediate Anesthesia Transfer of Care Note  Patient: Derek [name]  Procedure(s) Performed: Procedure(s) (LRB) with comments: TOTAL HIP ARTHROPLASTY ANTERIOR APPROACH (Right) - right anterior approach total hip arthroplasty  Patient Location: PACU  Anesthesia Type: General  Level of Consciousness: awake and alert   Airway & Oxygen Therapy: Patient Spontanous Breathing and Patient connected to nasal cannula oxygen  Post-op Assessment: Report given to PACU RN, Post -op Vital signs reviewed and stable and Patient moving all extremities X 4  Post vital signs: Reviewed and stable  Complications: No apparent anesthesia complications

## 2012-06-13 NOTE — Anesthesia Preprocedure Evaluation (Addendum)
Anesthesia Evaluation  Patient identified by MRN, date of birth, ID band Patient awake    Reviewed: Allergy & Precautions, H&P , NPO status , Patient's Chart, lab work & pertinent test results, reviewed documented beta blocker date and time   Airway Mallampati: II TM Distance: >3 FB Neck ROM: Full    Dental  (+) Teeth Intact and Dental Advisory Given   Pulmonary sleep apnea and Continuous Positive Airway Pressure Ventilation ,  breath sounds clear to auscultation        Cardiovascular hypertension, Pt. on medications Rhythm:Regular Rate:Normal     Neuro/Psych    GI/Hepatic GERD-  Medicated and Controlled,  Endo/Other  Well Controlled, Oral Hypoglycemic Agents  Renal/GU      Musculoskeletal   Abdominal (+) + obese,   Peds  Hematology   Anesthesia Other Findings   Reproductive/Obstetrics                          Anesthesia Physical Anesthesia Plan  ASA: III  Anesthesia Plan: General   Post-op Pain Management:    Induction: Intravenous  Airway Management Planned: Oral ETT  Additional Equipment:   Intra-op Plan:   Post-operative Plan: Extubation in OR  Informed Consent: I have reviewed the patients History and Physical, chart, labs and discussed the procedure including the risks, benefits and alternatives for the proposed anesthesia with the patient or authorized representative who has indicated his/her understanding and acceptance.   Dental advisory given  Plan Discussed with: CRNA and Surgeon  Anesthesia Plan Comments:        Anesthesia Quick Evaluation

## 2012-06-13 NOTE — Progress Notes (Signed)
Pt took both his amaryl and metformin this morning at 0730.  CBG 118 at this time.

## 2012-06-13 NOTE — Op Note (Signed)
PRE-OP DIAGNOSIS:  RIGHT HIP DEGENERATIVE JOINT DISEASE POST-OP DIAGNOSIS:  RIGHT HIP DEGENERATIVE JOINT DISEASE PROCEDURE:  Procedure(s): RIGHT TOTAL HIP ARTHROPLASTY ANTERIOR APPROACH ANESTHESIA:  General SURGEON:  Marcene Corning MD ASSISTANT:  Doneen Poisson MD   INDICATIONS FOR PROCEDURE:  The patient is a 66 y.o. male with a long history of a painful hip.  This has persisted despite multiple conservative measures.  The patient has persisted with pain and dysfunction making rest and activity difficult.  A total hip replacement is offered as surgical treatment.  Informed operative consent was obtained after discussion of possible complications including reaction to anesthesia, infection, neurovascular injury, dislocation, DVT, PE, and death.  The importance of the postoperative rehab program to optimize result was stressed with the patient.  SUMMARY OF FINDINGS AND PROCEDURE:  Under general anesthesia through a anterior approach an the Hana table a right THR was performed.  The patient had severe degenerative change and excellent bone quality.  We used DePuy components to replace the hip and these were size 12 Corail femur capped with a 36 -2 Stainless steel hip ball.  On the acetabular side we used a size 54 Gription shell with a size 54 plus 4 neutral polyethylene liner.  We did not use a hole eliminator.  Allie Bossier assisted throughout and was invaluable to the completion of the case in that he helped position and retract while I performed the procedure.  He also closed simultaneously to help minimize OR time.  DESCRIPTION OF PROCEDURE:  The patient was taken to the OR suite where general anesthetic was applied.  The patient was then positioned on the Hana table supine.  All bony prominences were appropriately padded.  Prep and drape was then performed in normal sterile fashion.  The patient was given Kefzol preoperative antibiotic and an appropriate time out was performed.  We then took  an anterior approach to the right hip.  Dissection was taken through adipose to the tensor fascia lata fascia.  This structure was incised longitudinally and we dissected in the intermuscular interval just medial to this muscle.  Cobra retractors were placed superior and inferior to the femoral neck superficial to the capsule.  A capsular incision was then made and the retractors were placed along the femoral neck.  Xray was brought in to get a good level for the femoral neck cut which was made with an oscillating saw and osteotome.  The femoral head was removed with a corkscrew.  The acetabulum was exposed and some labral tissues were excised. Reaming was taken to the inside wall of the pelvis and sequentially up to 1 mm smaller than the actual component.  A trial of components was done and then the aforementioned acetabular shell was placed in appropriate tilt and anteversion confirmed by fluoroscopy. The liner was placed but the hole eliminator didn't thread easily and was left out. At this pointattention was turned to the femur.  The leg was brought down and over into adduction and the elevator bar was used to raise the femur up gently in the wound.  The piriformis was released with care taken to preserve the obturator internus attachment and all of the posterior capsule. The femur was reamed and then broached to the appropriate size.  A trial reduction was done and the aforementioned head and neck assembly gave [place] the best stability in extension with external rotation.  Leg lengths were felt to be about equal by fluoroscopic exam.  The trial components were removed and the  wound irrigated.  We then placed the femoral component in appropriate anteversion.  The head was applied to a dry stem neck and the hip again reduced.  It was again stable in the aforementioned position.  The would was irrigated again followed by re-approximation of anterior capsule with ethibond suture. Tensor fascia was repaired with V-loc  suture  followed by subcutaneous closure with #O and #2 undyed vicryl.  Skin was closed with staples followed by a sterile dressing.  EBL and IOF can be obtained from anesthesia records.  DISPOSITION:  The patient was extubated in the OR and taken to PACU in stable condition to be admitted to the Orthopedic Surgery for appropriate post-op care to include perioperative antibiotics and DVT prophylaxis.

## 2012-06-13 NOTE — Progress Notes (Signed)
Pt. Says he feels like something is in his throat. Pt. Denies throat pain, or SOB. Oxygen sat 100% Uvula appears very swollen. Anesthesia aware.  No new orders. Pt may be transferred to 5 N as planned.

## 2012-06-14 ENCOUNTER — Encounter (HOSPITAL_COMMUNITY): Payer: Self-pay | Admitting: General Practice

## 2012-06-14 LAB — BASIC METABOLIC PANEL
CO2: 29 mEq/L (ref 19–32)
Glucose, Bld: 145 mg/dL — ABNORMAL HIGH (ref 70–99)
Potassium: 3.8 mEq/L (ref 3.5–5.1)
Sodium: 136 mEq/L (ref 135–145)

## 2012-06-14 LAB — CBC
Hemoglobin: 12.4 g/dL — ABNORMAL LOW (ref 13.0–17.0)
RBC: 3.68 MIL/uL — ABNORMAL LOW (ref 4.22–5.81)
WBC: 9.8 10*3/uL (ref 4.0–10.5)

## 2012-06-14 LAB — GLUCOSE, CAPILLARY
Glucose-Capillary: 134 mg/dL — ABNORMAL HIGH (ref 70–99)
Glucose-Capillary: 167 mg/dL — ABNORMAL HIGH (ref 70–99)

## 2012-06-14 NOTE — Progress Notes (Signed)
CARE MANAGEMENT NOTE 06/14/2012  Patient:  MASSAI, HANKERSON   Account Number:  [id number]  Date Initiated:  06/14/2012  Documentation initiated by:  Vance Peper  Subjective/Objective Assessment:   66 yr old male s/p right total hip arthroplasty     Action/Plan:   CM spoke with patient regarding home health and DME needs. Choice offered. Patient preoperatively setup with [facility], no changes.   Anticipated DC Date:  06/16/2012   Anticipated DC Plan:  HOME W HOME HEALTH SERVICES      DC Planning Services  CM consult      PAC Choice  DURABLE MEDICAL EQUIPMENT  HOME HEALTH   Choice offered to / List presented to:  C-1 Patient   DME arranged  3-N-1  Levan Hurst      DME agency  [facility]     HH arranged  HH-2 PT      [facility] agency  [facility]   Status of service:  Completed, signed off Medicare Important Message given?   (If response is "NO", the following Medicare IM given date fields will be blank) Date Medicare IM given:   Date Additional Medicare IM given:    Discharge Disposition:  HOME W HOME HEALTH SERVICES  Per UR Regulation:    If discussed at Long Length of Stay Meetings, dates discussed:    Comments:

## 2012-06-14 NOTE — Progress Notes (Signed)
Occupational Therapy Evaluation Patient Details Name: Derek [name] MRN: [id number] DOB: 1945-12-16 Today's Date: 06/14/2012 Time: 1706-     OT Assessment / Plan / Recommendation Clinical Impression  66 yo s/p direct ant THA. No hip precautions. WBAT. Pt will benefit from skilled OT srvices to max independence with ADL and functional mobility for ADL with nec DME and AE due to below deficits.. Pt's wife will be able to assist as needed after D/C.     OT Assessment  Patient needs continued OT Services    Follow Up Recommendations  No OT follow up    Barriers to Discharge None    Equipment Recommendations  None recommended by OT    Recommendations for Other Services  none  Frequency  Min 2X/week    Precautions / Restrictions Precautions Precautions: None Restrictions RLE Weight Bearing: Weight bearing as tolerated   Pertinent Vitals/Pain 4    ADL  Grooming: Performed;Supervision/safety Where Assessed - Grooming: Unsupported standing Upper Body Bathing: Simulated;Set up Where Assessed - Upper Body Bathing: Unsupported sitting Lower Body Bathing: Simulated;Minimal assistance Where Assessed - Lower Body Bathing: Supported sit to stand Upper Body Dressing: Simulated;Set up Where Assessed - Upper Body Dressing: Unsupported sitting Lower Body Dressing: Simulated;Moderate assistance Where Assessed - Lower Body Dressing: Supported sit to Pharmacist, hospital: Performed;Minimal Dentist Method: Sit to Barista: Comfort height toilet Toileting - Clothing Manipulation and Hygiene: Minimal assistance Where Assessed - Engineer, mining and Hygiene: Standing Tub/Shower Transfer: Paramedic Method: Science writer: Other (comment) (3 in 1 step over backwards) Equipment Used: Gait belt;Rolling walker Transfers/Ambulation Related to ADLs: min A. requires vc for  sequencing of using RW.  ADL Comments: Educated pt's wife on availability of AE if needed.    OT Diagnosis: Generalized weakness;Acute pain  OT Problem List: Decreased strength;Decreased range of motion;Decreased activity tolerance;Decreased safety awareness;Decreased knowledge of use of DME or AE;Pain OT Treatment Interventions: Self-care/ADL training;Energy conservation;DME and/or AE instruction;Therapeutic activities;Patient/family education   OT Goals Acute Rehab OT Goals OT Goal Formulation: With patient Time For Goal Achievement: 06/21/12 Potential to Achieve Goals: Good ADL Goals Pt Will Perform Lower Body Dressing: with supervision;with caregiver independent in assisting;Unsupported;with adaptive equipment;with cueing (comment type and amount) ADL Goal: Lower Body Dressing - Progress: Goal set today Pt Will Transfer to Toilet: with supervision;with caregiver independent in assisting;with DME;3-in-1 ADL Goal: Toilet Transfer - Progress: Goal set today Pt Will Perform Toileting - Clothing Manipulation: with modified independence;Standing ADL Goal: Toileting - Clothing Manipulation - Progress: Goal set today Pt Will Perform Toileting - Hygiene: with modified independence;Standing at 3-in-1/toilet;Sit to stand from 3-in-1/toilet ADL Goal: Toileting - Hygiene - Progress: Goal set today Pt Will Perform Tub/Shower Transfer: Shower transfer;with supervision;with caregiver independent in assisting;with DME;Other (comment) (using 3 in 1) ADL Goal: Tub/Shower Transfer - Progress: Goal set today  Visit Information  Last OT Received On: 06/14/12 Assistance Needed: +1    Subjective Data      Prior Functioning  Vision/Perception  Home Living Lives With: Spouse Available Help at Discharge: Family Type of Home: House Home Access: Stairs to enter Secretary/administrator of Steps: 3 Entrance Stairs-Rails: Left Home Layout: Two level;1/2 bath on main level Alternate Level Stairs-Number  of Steps: 9 Alternate Level Stairs-Rails: Right;Left;Can reach both Bathroom Shower/Tub: Walk-in shower;Door [employer] Toilet: Handicapped height Bathroom Accessibility: Yes How Accessible: Accessible via walker Home Adaptive Equipment: Straight cane;Crutches Prior Function Level of Independence: Independent with assistive device(s) (uses SPC)  Able to Take Stairs?: Yes Driving: Yes Vocation: Retired Musician: No difficulties Dominant Hand: Right      Cognition  Overall Cognitive Status: Appears within functional limits for tasks assessed/performed Arousal/Alertness: Awake/alert Orientation Level: Appears intact for tasks assessed Behavior During Session: [facility] for tasks performed    Extremity/Trunk Assessment Right Upper Extremity Assessment RUE ROM/Strength/Tone: Within functional levels Left Upper Extremity Assessment LUE ROM/Strength/Tone: Within functional levels Trunk Assessment Trunk Assessment: Kyphotic   Mobility    Bed Mobility Bed Mobility: Sit to Supine;Scooting to HOB Sit to Supine: 4: Min assist;HOB flat Scooting to HOB: 5: Supervision Details for Bed Mobility Assistance: (A) with R LE lift into the bed.  Cues for proper technique. Transfers Transfers: Sit to Stand;Stand to Sit Sit to Stand: With upper extremity assist;From toilet;4: Min assist Stand to Sit: 4: Min assist;To toilet Details for Transfer Assistance: Increased assistance needed with lower height toilet.        Exercise     Balance  S   End of Session OT - End of Session Equipment Utilized During Treatment: Gait belt Activity Tolerance: Patient tolerated treatment well Patient left: in bed;with call bell/phone within reach;with family/visitor present Nurse Communication: Mobility status;Weight bearing status  GO     Skylarr Liz,HILLARY 06/14/2012, 6:07 PM [facility], OTR/L  [phone] 06/14/2012

## 2012-06-14 NOTE — Progress Notes (Signed)
Subjective: 1 Day Post-Op Procedure(s) (LRB): TOTAL HIP ARTHROPLASTY ANTERIOR APPROACH (Right)  Activity level:  oob wbat Diet tolerance:  ok Voiding:  ok Patient reports pain as 2 on 0-10 scale.    Objective: Vital signs in last 24 hours: Temp:  [97.7 F (36.5 C)-99.4 F (37.4 C)] 98.4 F (36.9 C) (09/04 0534) Pulse Rate:  [70-99] 90  (09/04 0534) Resp:  [15-22] 16  (09/04 0534) BP: (124-158)/(57-83) 125/57 mmHg (09/04 0534) SpO2:  [98 %-100 %] 100 % (09/04 0534)  Labs:  Basename 06/14/12 0655  HGB 12.4*    Basename 06/14/12 0655  WBC 9.8  RBC 3.68*  HCT 36.2*  PLT 246    Basename 06/14/12 0655  NA 136  K 3.8  CL 99  CO2 29  BUN 8  CREATININE 0.65  GLUCOSE 145*  CALCIUM 9.0   No results found for this basename: LABPT:2,INR:2 in the last 72 hours  Physical Exam:  Neurologically intact ABD soft Neurovascular intact Sensation intact distally Intact pulses distally Dorsiflexion/Plantar flexion intact No cellulitis present Compartment soft  Assessment/Plan:  1 Day Post-Op Procedure(s) (LRB): TOTAL HIP ARTHROPLASTY ANTERIOR APPROACH (Right) Advance diet Up with therapy D/C IV fluids Plan for discharge tomorrow    Marry Kusch R 06/14/2012, 8:05 AM

## 2012-06-14 NOTE — Progress Notes (Signed)
Agree with PT treatment note.  Knoxx Boeding, PT DPT [phone]  

## 2012-06-14 NOTE — Progress Notes (Signed)
UR COMPLETED  

## 2012-06-14 NOTE — Progress Notes (Signed)
Referral received for SNF. Chart reviewed and CSW has spoken with RNCM who indicates that patient is for DC to home with Home Health and DME.  CSW to sign off. Please re-consult if CSW needs arise.  Mliss Wedin T. Alonzo Loving, LCSWA  [phone]  

## 2012-06-14 NOTE — Progress Notes (Signed)
Physical Therapy Treatment Patient Details Name: Derek [name] MRN: [id number] DOB: [birth year] Today's Date: 06/14/2012 Time: 1203-1227 PT Time Calculation (min): 24 min  PT Assessment / Plan / Recommendation Comments on Treatment Session  Pt was very motivated to ambulate and perform the HEP.  Pt's wife was in the room and assisted with sit to supine transfer by helping to lift the pt R LE into the bed.      Follow Up Recommendations  Home health PT    Barriers to Discharge        Equipment Recommendations  Rolling walker with 5" wheels;3 in 1 bedside comode    Recommendations for Other Services    Frequency Min 5X/week   Plan Discharge plan remains appropriate;Frequency remains appropriate    Precautions / Restrictions Precautions Precautions: None (Direct Anterior THA no hip precautions) Restrictions Weight Bearing Restrictions: Yes RLE Weight Bearing: Weight bearing as tolerated   Pertinent Vitals/Pain 4/10 R LE    Mobility  Bed Mobility Bed Mobility: Sit to Supine;Scooting to HOB Sit to Supine: 4: Min assist Scooting to [facility]: 5: Supervision Details for Bed Mobility Assistance: (A) with R LE lift into the bed.  Cues for proper technique. Transfers Transfers: Sit to Stand;Stand to Sit Sit to Stand: 4: Min guard Stand to Sit: 4: Min guard Details for Transfer Assistance: (A)  with slow descend and cues for hand and LE placement Ambulation/Gait Ambulation/Gait Assistance: 4: Min guard Ambulation Distance (Feet): 100 Feet Assistive device: Rolling walker Ambulation/Gait Assistance Details: Minguard for safety with cues for proper step sequence Gait Pattern: Decreased stance time - right;Decreased step length - left;Step-through pattern;Decreased hip/knee flexion - right;Decreased weight shift to right Gait velocity: slow Stairs: No Wheelchair Mobility Wheelchair Mobility: No    Exercises Total Joint Exercises Ankle Circles/Pumps: AROM;10 reps;Both Quad Sets:  AROM;10 reps;Right Short Arc Quad: AROM;Right;10 reps;Strengthening Heel Slides: AAROM;Right;10 reps   PT Diagnosis:    PT Problem List:   PT Treatment Interventions:     PT Goals Acute Rehab PT Goals PT Goal Formulation: With patient Time For Goal Achievement: 06/21/12 Potential to Achieve Goals: Good Pt will go Sit to Supine/Side: with modified independence;with HOB 0 degrees PT Goal: Sit to Supine/Side - Progress: Progressing toward goal Pt will go Sit to Stand: with modified independence PT Goal: Sit to Stand - Progress: Progressing toward goal Pt will go Stand to Sit: with modified independence PT Goal: Stand to Sit - Progress: Progressing toward goal Pt will Transfer Bed to Chair/Chair to Bed: with modified independence PT Transfer Goal: Bed to Chair/Chair to Bed - Progress: Progressing toward goal Pt will Ambulate: 51 - 150 feet;with modified independence;with rolling walker PT Goal: Ambulate - Progress: Progressing toward goal Pt will Perform Home Exercise Program: Independently PT Goal: Perform Home Exercise Program - Progress: Progressing toward goal  Visit Information  Last PT Received On: 06/14/12 Assistance Needed: +1    Subjective Data  Subjective: Pt stated that he wanted to walk and then return back to the bed. Patient Stated Goal: To go home   Cognition  Overall Cognitive Status: Appears within functional limits for tasks assessed/performed Arousal/Alertness: Awake/alert Orientation Level: Appears intact for tasks assessed Behavior During Session: [facility] for tasks performed    Balance     End of Session PT - End of Session Equipment Utilized During Treatment: Gait belt Activity Tolerance: Patient tolerated treatment well Patient left: in bed;with family/visitor present;with call bell/phone within reach Nurse Communication: Mobility status   GP  Derek [name] 06/14/2012, 1:10 PM Derek [name], SPT

## 2012-06-14 NOTE — Progress Notes (Addendum)
Physical Therapy Evaluation Patient Details Name: Derek [name] MRN: [id number] DOB: 1945/11/12 Today's Date: 06/14/2012 Time: 4098-1191 PT Time Calculation (min): 36 min  PT Assessment / Plan / Recommendation Clinical Impression  Pt is 66 y/o male admitted for s/p direct anterior THA and has no direct hip precautions.  Pt will benefit from acute PT services to improve overall mobility and prepare for safe d/c home.    PT Assessment  Patient needs continued PT services    Follow Up Recommendations  Home health PT    Barriers to Discharge None      Equipment Recommendations  Rolling walker with 5" wheels;3 in 1 bedside comode    Recommendations for Other Services     Frequency Min 5X/week    Precautions / Restrictions Precautions Precautions: None (Direct anterior approach no hip percautions per MD) Restrictions Weight Bearing Restrictions:  (WBAT) RLE Weight Bearing: Weight bearing as tolerated   Pertinent Vitals/Pain 5/10 R LE       Mobility  Bed Mobility Bed Mobility: Rolling Right;Supine to Sit Supine to Sit: 2: Min (A)  Transfers Transfers: Sit to Stand;Stand to Sit Sit to Stand: 3: Mod assist Stand to Sit: 3: Mod assist Ambulation/Gait Ambulation/Gait Assistance: 4: Min guard Ambulation Distance (Feet): 40 Feet Assistive device: Rolling walker Gait Pattern: Decreased stance time - right;Decreased step length - left;Step-through pattern;Decreased hip/knee flexion - right;Decreased weight shift to right Gait velocity: very slow Stairs: No Wheelchair Mobility Wheelchair Mobility: No    Exercises Total Joint Exercises Ankle Circles/Pumps: AROM;Both;10 reps Quad Sets: AROM;5 reps;Right Heel Slides: AAROM;5 reps;Right   PT Diagnosis: Acute pain;Generalized weakness;Difficulty walking;Abnormality of gait  PT Problem List: Decreased strength;Decreased range of motion;Decreased mobility;Pain PT Treatment Interventions: Gait training;Stair  training;Therapeutic activities;Therapeutic exercise;Functional mobility training   PT Goals Acute Rehab PT Goals PT Goal Formulation: With patient Time For Goal Achievement: 06/21/12 Potential to Achieve Goals: Good Pt will go Supine/Side to Sit: with modified independence;with HOB 0 degrees PT Goal: Supine/Side to Sit - Progress: Goal set today Pt will go Sit to Supine/Side: with modified independence;with HOB 0 degrees PT Goal: Sit to Supine/Side - Progress: Goal set today Pt will go Sit to Stand: with modified independence PT Goal: Sit to Stand - Progress: Goal set today Pt will go Stand to Sit: with modified independence PT Goal: Stand to Sit - Progress: Goal set today Pt will Transfer Bed to Chair/Chair to Bed: with modified independence PT Transfer Goal: Bed to Chair/Chair to Bed - Progress: Goal set today Pt will Ambulate: 51 - 150 feet;with modified independence;with rolling walker PT Goal: Ambulate - Progress: Goal set today Pt will Go Up / Down Stairs: 6-9 stairs;with supervision;with least restrictive assistive device PT Goal: Up/Down Stairs - Progress: Goal set today Pt will Perform Home Exercise Program: Independently PT Goal: Perform Home Exercise Program - Progress: Goal set today  Visit Information  Last PT Received On: 06/14/12 Assistance Needed: +1    Subjective Data  Subjective: Pt stated he was not in a lot of pain and was motivated to get up OOB. Patient Stated Goal: To go home   Prior Functioning  Home Living Lives With: Spouse Available Help at Discharge: Family Type of Home: House Home Access: Stairs to enter Secretary/administrator of Steps: 3 Entrance Stairs-Rails: Left Home Layout: Two level;1/2 bath on main level Alternate Level Stairs-Number of Steps: 9 Alternate Level Stairs-Rails: Right;Left;Can reach both Bathroom Shower/Tub: Walk-in shower;Door Bathroom Toilet: Handicapped height Bathroom Accessibility: Yes How Accessible: Accessible  via  walker Home Adaptive Equipment: Straight cane;Crutches Prior Function Level of Independence: Independent with assistive device(s) (uses SPC) Able to Take Stairs?: Yes Driving: Yes Vocation: Retired Musician: No difficulties Dominant Hand: Right    Cognition  Overall Cognitive Status: Appears within functional limits for tasks assessed/performed Arousal/Alertness: Awake/alert Orientation Level: Appears intact for tasks assessed Behavior During Session: [facility] for tasks performed    Extremity/Trunk Assessment Right Upper Extremity Assessment RUE ROM/Strength/Tone: Within functional levels Left Upper Extremity Assessment LUE ROM/Strength/Tone: Within functional levels Right Lower Extremity Assessment RLE ROM/Strength/Tone: Deficits;Unable to fully assess;Due to pain Left Lower Extremity Assessment LLE ROM/Strength/Tone: Within functional levels   Balance    End of Session PT - End of Session Equipment Utilized During Treatment: Gait belt Activity Tolerance: Patient tolerated treatment well Patient left: in chair;with call bell/phone within reach Nurse Communication: Mobility status  GP     Derek [name] 06/14/2012, 10:40 AM Jake Shark, PT DPT [phone]

## 2012-06-15 LAB — BASIC METABOLIC PANEL
BUN: 10 mg/dL (ref 6–23)
Calcium: 9.5 mg/dL (ref 8.4–10.5)
Creatinine, Ser: 0.69 mg/dL (ref 0.50–1.35)
GFR calc non Af Amer: >90 mL/min (ref >90)
Glucose, Bld: 113 mg/dL — ABNORMAL HIGH (ref 70–99)

## 2012-06-15 LAB — CBC
HCT: 35.4 % — ABNORMAL LOW (ref 39.0–52.0)
Hemoglobin: 12.1 g/dL — ABNORMAL LOW (ref 13.0–17.0)
MCH: 33.2 pg (ref 26.0–34.0)
MCHC: 34.2 g/dL (ref 30.0–36.0)
MCV: 97 fL (ref 78.0–100.0)

## 2012-06-15 MED ORDER — ASPIRIN 325 MG PO TBEC: 325.0000 mg | DELAYED_RELEASE_TABLET | Freq: Two times a day (BID) | ORAL | Status: AC

## 2012-06-15 MED ORDER — OXYCODONE HCL 5 MG PO TABS: 5.0000 mg | ORAL_TABLET | ORAL | Status: AC | PRN

## 2012-06-15 MED ORDER — METHOCARBAMOL 500 MG PO TABS: 500.0000 mg | ORAL_TABLET | Freq: Four times a day (QID) | ORAL | Status: AC | PRN

## 2012-06-15 NOTE — Progress Notes (Signed)
Subjective: 2 Days Post-Op Procedure(s) (LRB): TOTAL HIP ARTHROPLASTY ANTERIOR APPROACH (Right)  Activity level:  oob Diet tolerance:  ok Voiding:  ok Patient reports pain as 2 on 0-10 scale.    Objective: Vital signs in last 24 hours: Temp:  [98.5 F (36.9 C)-99.1 F (37.3 C)] 98.5 F (36.9 C) (09/05 0710) Pulse Rate:  [98-99] 98  (09/05 0710) Resp:  [18] 18  (09/05 0710) BP: (146-155)/(76-80) 146/80 mmHg (09/05 0955) SpO2:  [97 %-98 %] 98 % (09/05 0710)  Labs:  Basename 06/15/12 0650 06/14/12 0655  HGB 12.1* 12.4*    Basename 06/15/12 0650 06/14/12 0655  WBC 11.4* 9.8  RBC 3.65* 3.68*  HCT 35.4* 36.2*  PLT 228 246    Basename 06/15/12 0650 06/14/12 0655  NA 134* 136  K 3.7 3.8  CL 97 99  CO2 29 29  BUN 10 8  CREATININE 0.69 0.65  GLUCOSE 113* 145*  CALCIUM 9.5 9.0   No results found for this basename: LABPT:2,INR:2 in the last 72 hours  Physical Exam:  Neurologically intact ABD soft Neurovascular intact Sensation intact distally Intact pulses distally Dorsiflexion/Plantar flexion intact Incision: dressing C/D/I No cellulitis present Compartment soft  Assessment/Plan:  2 Days Post-Op Procedure(s) (LRB): TOTAL HIP ARTHROPLASTY ANTERIOR APPROACH (Right) Advance diet Up with therapy Discharge home with home health    Hadiyah Maricle R 06/15/2012, 1:46 PM

## 2012-06-15 NOTE — Discharge Summary (Signed)
Patient ID: Derek [name] MRN: [id number] DOB/AGE: 1946-02-28 66 y.o.  Admit date: 06/13/2012 Discharge date: 06/15/2012  Admission Diagnoses:  Principal Problem:  *DJD (degenerative joint disease) of hip   Discharge Diagnoses:  Same  Past Medical History  Diagnosis Date  . Hypertension   . Arthritis   . Mental disorder     PTSD  . Sleep apnea     cpap with O2 4L for 4 yrs  . Diabetes mellitus     type 2    Surgeries: Procedure(s): TOTAL HIP ARTHROPLASTY ANTERIOR APPROACH on 06/13/2012   Consultants:    Discharged Condition: Improved  Hospital Course: Derek [name] is an 66 y.o. male who was admitted 06/13/2012 for operative treatment ofDJD (degenerative joint disease) of hip. Patient has severe unremitting pain that affects sleep, daily activities, and work/hobbies. After pre-op clearance the patient was taken to the operating room on 06/13/2012 and underwent  Procedure(s): TOTAL HIP ARTHROPLASTY ANTERIOR APPROACH.    Patient was given perioperative antibiotics: Anti-infectives     Start     Dose/Rate Route Frequency Ordered Stop   07/12/2012 1000   hydroxychloroquine (PLAQUENIL) tablet 400 mg        400 mg Oral Daily 06/13/12 1732     06/13/12 2200   hydroxychloroquine (PLAQUENIL) tablet 200-400 mg  Status:  Discontinued        200-400 mg Oral 2 times daily 06/13/12 1728 06/13/12 1731   06/13/12 2200   hydroxychloroquine (PLAQUENIL) tablet 200 mg        200 mg Oral Daily at bedtime 06/13/12 1732     06/13/12 1900   ceFAZolin (ANCEF) IVPB 2 g/50 mL premix        2 g 100 mL/hr over 30 Minutes Intravenous 4 times per day 06/13/12 1728 Jul 12, 2012 0009   06/12/12 1528   ceFAZolin (ANCEF) IVPB 2 g/50 mL premix        2 g 100 mL/hr over 30 Minutes Intravenous 60 min pre-op 06/12/12 1528 06/13/12 1220           Patient was given sequential compression devices, early ambulation, and chemoprophylaxis to prevent DVT.  Patient benefited maximally from hospital stay  and there were no complications.    Recent vital signs: Patient Vitals for the past 24 hrs:  BP Temp Pulse Resp SpO2  06/15/12 0955 146/80 mmHg - - - -  06/15/12 0710 155/78 mmHg 98.5 F (36.9 C) 98  18  98 %  07-12-2012 2000 150/76 mmHg 98.7 F (37.1 C) 98  18  98 %  07/12/12 1400 152/78 mmHg 99.1 F (37.3 C) 99  18  97 %     Recent laboratory studies:  [facility] 06/15/12 0650 2012/07/12 0655  WBC 11.4* 9.8  HGB 12.1* 12.4*  HCT 35.4* 36.2*  PLT 228 246  NA 134* 136  K 3.7 3.8  CL 97 99  CO2 29 29  BUN 10 8  CREATININE 0.69 0.65  GLUCOSE 113* 145*  INR -- --  CALCIUM 9.5 --     Discharge Medications:   Medication List  As of 06/15/2012  1:53 PM   STOP taking these medications         acetaminophen 500 MG tablet         TAKE these medications         allopurinol 300 MG tablet   Commonly known as: ZYLOPRIM   Take 300 mg by mouth daily.      aspirin 325 MG EC  tablet   Take 1 tablet (325 mg total) by mouth 2 (two) times daily.      cholecalciferol 1000 UNITS tablet   Commonly known as: VITAMIN D   Take 1,000 Units by mouth daily.      Fish Oil 300 MG Caps   Take 300 mg by mouth daily.      folic acid 1 MG tablet   Commonly known as: FOLVITE   Take 1 mg by mouth daily.      furosemide 20 MG tablet   Commonly known as: LASIX   Take 20 mg by mouth daily.      glimepiride 1 MG tablet   Commonly known as: AMARYL   Take 0.5 mg by mouth daily before breakfast.      hydroxychloroquine 200 MG tablet   Commonly known as: PLAQUENIL   Take 200-400 mg by mouth 2 (two) times daily. Take 2 tabs in the morning and 1 tab in the evening      lisinopril 10 MG tablet   Commonly known as: PRINIVIL,ZESTRIL   Take 10 mg by mouth daily.      metFORMIN 1000 MG tablet   Commonly known as: GLUCOPHAGE   Take 500 mg by mouth 2 (two) times daily with a meal.      methocarbamol 500 MG tablet   Commonly known as: ROBAXIN   Take 1 tablet (500 mg total) by mouth every 6 (six)  hours as needed.      methotrexate 2.5 MG tablet   Commonly known as: RHEUMATREX   Take 12.5 mg by mouth 2 (two) times a week. Caution:Chemotherapy. Protect from light.; takes 5 tablets (12.5mg  total) on Fridays and Saturdays at 5 PM      oxyCODONE 5 MG immediate release tablet   Commonly known as: Oxy IR/ROXICODONE   Take 1-2 tablets (5-10 mg total) by mouth every 3 (three) hours as needed.      prazosin 2 MG capsule   Commonly known as: MINIPRESS   Take 2 mg by mouth at bedtime.      predniSONE 5 MG tablet   Commonly known as: DELTASONE   Take 5 mg by mouth daily.      traZODone 50 MG tablet   Commonly known as: DESYREL   Take 25 mg by mouth at bedtime.            Diagnostic Studies: Dg Chest 2 View  06/06/2012  *RADIOLOGY REPORT*  Clinical Data: Osteoarthritis of the right hip.  Preoperative respiratory exam.  CHEST - 2 VIEW  Comparison: None.  Findings: The heart size and pulmonary vascularity are normal and the lungs are clear.  No acute osseous abnormalities.  IMPRESSION: No acute disease.   Original Report Authenticated By: Gwynn Burly, M.D.    Dg Hip Operative Right  06/13/2012  *RADIOLOGY REPORT*  Clinical Data: Intraoperative hip.  Fluoro time 29 seconds.  DG OPERATIVE RIGHT HIP  Comparison: 09/18/2010  Findings: Four intraoperative images are submitted, showing different views of total hip arthroplasty.  There is no evidence for dislocation or acute fracture.  IMPRESSION: Status post total hip arthroplasty.  No adverse features identified.   Original Report Authenticated By: Patterson Hammersmith, M.D.    Dg Pelvis Portable  06/13/2012  *RADIOLOGY REPORT*  Clinical Data: Status post right total hip arthroplasty.  PORTABLE PELVIS  Comparison: Two-view right hip 09/18/2010.  Findings: The patient is status post right total hip arthroplasty. The prosthesis is well positioned on this single  view.  Fluid and gas are present within the joint space.  The remainder of the pelvis  is stable.  IMPRESSION:  1. Status post right total hip arthroplasty without radiographic evidence for complication.   Original Report Authenticated By: Jamesetta Orleans. MATTERN, M.D.     Disposition: 01-Home or Self Care  Discharge Orders    Future Orders Please Complete By Expires   Diet - low sodium heart healthy      Call MD / Call 911      Comments:   If you experience chest pain or shortness of breath, CALL 911 and be transported to the hospital emergency room.  If you develope a fever above 101 F, pus (white drainage) or increased drainage or redness at the wound, or calf pain, call your surgeon's office.   Constipation Prevention      Comments:   Drink plenty of fluids.  Prune juice may be helpful.  You may use a stool softener, such as Colace (over the counter) 100 mg twice a day.  Use MiraLax (over the counter) for constipation as needed.   Increase activity slowly as tolerated         Follow-up Information    Follow up with Velna Ochs, MD in 2 weeks.   Contact information:   [address] [place] [address] [phone]           Signed: Prince Rome 06/15/2012, 1:53 PM

## 2012-06-15 NOTE — Progress Notes (Signed)
Physical Therapy Treatment Patient Details Name: Derek [name] MRN: [id number] DOB: [birth year] Today's Date: 06/15/2012 Time: 5621-3086 PT Time Calculation (min): 27 min  PT Assessment / Plan / Recommendation Comments on Treatment Session  Pt require assistance with minial cueing and was able to negoiate several trials of stairs.  HEP was reviewed and pt  reports completing the exercises several times yesterday.    Follow Up Recommendations  Home health PT    Barriers to Discharge        Equipment Recommendations  None recommended by PT    Recommendations for Other Services    Frequency 7X/week   Plan Discharge plan remains appropriate;Frequency remains appropriate    Precautions / Restrictions Precautions Precautions: None Restrictions Weight Bearing Restrictions: Yes RLE Weight Bearing: Weight bearing as tolerated   Pertinent Vitals/Pain 2/10 R LE    Mobility  Transfers Transfers: Sit to Stand;Stand to Sit Sit to Stand: With upper extremity assist;5: Supervision;From chair/3-in-1 Stand to Sit: 4: Min guard;To chair/3-in-1;With armrests Details for Transfer Assistance: minguard for safety with cues for hand placement Ambulation/Gait Ambulation/Gait Assistance: 5: Supervision Ambulation Distance (Feet): 150 Feet Assistive device: Rolling walker Ambulation/Gait Assistance Details: Supervison due to minimal cueing and maintain safety. Gait Pattern: Decreased stance time - right;Decreased step length - left;Step-through pattern;Decreased hip/knee flexion - right;Decreased weight shift to right Gait velocity: moderate Stairs: Yes Stairs Assistance: 5: Supervision Stairs Assistance Details (indicate cue type and reason): cues for proper technique and safety. Stair Management Technique: Two rails;One rail Left;With cane;Forwards;Step to pattern Number of Stairs: 5  ((5 trials))    Exercises Total Joint Exercises Ankle Circles/Pumps: AROM;10 reps;Both Quad Sets:  AROM;Right;10 reps Short Arc Quad: AROM;Right;10 reps Heel Slides: AAROM;Right;10 reps   PT Diagnosis:    PT Problem List:   PT Treatment Interventions:     PT Goals Acute Rehab PT Goals PT Goal Formulation: With patient Time For Goal Achievement: 06/21/12 Potential to Achieve Goals: Good Pt will go Sit to Stand: with modified independence PT Goal: Sit to Stand - Progress: Progressing toward goal Pt will go Stand to Sit: with modified independence PT Goal: Stand to Sit - Progress: Progressing toward goal Pt will Ambulate: 51 - 150 feet;with modified independence;with rolling walker PT Goal: Ambulate - Progress: Met Pt will Go Up / Down Stairs: 6-9 stairs;with supervision;with least restrictive assistive device PT Goal: Up/Down Stairs - Progress: Met Pt will Perform Home Exercise Program: Independently PT Goal: Perform Home Exercise Program - Progress: Met  Visit Information  Last PT Received On: 06/15/12 Assistance Needed: +1    Subjective Data  Subjective: Pt reported feeling better today and had not experienced any sharp pains from the night before. Patient Stated Goal: To go home   Cognition  Overall Cognitive Status: Appears within functional limits for tasks assessed/performed Arousal/Alertness: Awake/alert Orientation Level: Appears intact for tasks assessed Behavior During Session: [facility] for tasks performed    Balance     End of Session PT - End of Session Equipment Utilized During Treatment: Gait belt Activity Tolerance: Patient tolerated treatment well Patient left: in chair;with call bell/phone within reach;with family/visitor present Nurse Communication: Mobility status   GP     Derek [name] 06/15/2012, 12:25 PM Jake Shark, PT DPT [phone]

## 2012-08-09 ENCOUNTER — Other Ambulatory Visit: Payer: Self-pay | Admitting: Internal Medicine

## 2012-08-09 ENCOUNTER — Other Ambulatory Visit: Payer: Self-pay | Admitting: Specialist

## 2017-01-19 ENCOUNTER — Other Ambulatory Visit: Payer: Self-pay | Admitting: Internal Medicine

## 2017-01-19 ENCOUNTER — Ambulatory Visit
Admission: RE | Admit: 2017-01-19 | Discharge: 2017-01-19 | Disposition: A | Discharge status: Home / Self Care | Payer: BC Managed Care – PPO | Source: Ambulatory Visit | Attending: Internal Medicine | Admitting: Internal Medicine

## 2017-01-19 DIAGNOSIS — J40 Bronchitis, not specified as acute or chronic: Secondary | ICD-10-CM

## 2017-12-02 HISTORY — PX: CORONARY ARTERY BYPASS GRAFT: SHX141

## 2018-01-10 ENCOUNTER — Telehealth (HOSPITAL_COMMUNITY): Payer: Self-pay

## 2018-01-10 NOTE — Telephone Encounter (Signed)
Called to speak with patient in regards to Cardiac Rehab - Scheduled orientation on 03/07/2018 at 8:45am. Patient will attend the 11:15am exc class. Mailed packet.

## 2018-03-01 ENCOUNTER — Telehealth (HOSPITAL_COMMUNITY): Payer: Self-pay | Admitting: Pharmacist

## 2018-03-06 NOTE — Telephone Encounter (Signed)
Cardiac Rehab Medication Review by a Pharmacist  Does the patient  feel that his/her medications are working for him/her?  yes  Has the patient been experiencing any side effects to the medications prescribed?  No  Does the patient measure his/her own blood pressure or blood glucose at home?  Yes checks BP but not BG at this time   Does the patient have any problems obtaining medications due to transportation or finances?   no  Understanding of regimen: poor Understanding of indications: poor Potential of compliance: fair     Pharmacist comments: Derek [name] is a 72 y.o. male with an upcoming cardiac rehab appointment. I contacted him today to discuss his medications. He had no concerns with his regimen at this time, but did mention he has been having some toe swelling but that it is not painful at this time. Encouraged him to bring this up with his provider. He had no further questions at this time. Based on our conversation, I am unsure of the level of his understanding of his medication regimen and indications.    Jalene Mullet, Pharm.D. PGY1 Pharmacy Resident 03/06/2018 1:16 PM Main Pharmacy: [phone]

## 2018-03-07 ENCOUNTER — Encounter (HOSPITAL_COMMUNITY)
Admission: RE | Admit: 2018-03-07 | Discharge: 2018-03-07 | Disposition: A | Discharge status: Home / Self Care | Payer: No Typology Code available for payment source | Source: Ambulatory Visit | Attending: Cardiology | Admitting: Cardiology

## 2018-03-07 ENCOUNTER — Encounter (HOSPITAL_COMMUNITY): Payer: Self-pay

## 2018-03-07 VITALS — BP 122/70 | HR 64 | Ht 69.0 in | Wt 190.9 lb

## 2018-03-07 DIAGNOSIS — I10 Essential (primary) hypertension: Secondary | ICD-10-CM | POA: Insufficient documentation

## 2018-03-07 DIAGNOSIS — E119 Type 2 diabetes mellitus without complications: Secondary | ICD-10-CM | POA: Insufficient documentation

## 2018-03-07 DIAGNOSIS — F4310 Post-traumatic stress disorder, unspecified: Secondary | ICD-10-CM | POA: Insufficient documentation

## 2018-03-07 DIAGNOSIS — Z79899 Other long term (current) drug therapy: Secondary | ICD-10-CM | POA: Diagnosis not present

## 2018-03-07 DIAGNOSIS — Z7984 Long term (current) use of oral hypoglycemic drugs: Secondary | ICD-10-CM | POA: Insufficient documentation

## 2018-03-07 DIAGNOSIS — Z951 Presence of aortocoronary bypass graft: Secondary | ICD-10-CM | POA: Diagnosis present

## 2018-03-07 DIAGNOSIS — I2510 Atherosclerotic heart disease of native coronary artery without angina pectoris: Secondary | ICD-10-CM | POA: Insufficient documentation

## 2018-03-07 DIAGNOSIS — G4730 Sleep apnea, unspecified: Secondary | ICD-10-CM | POA: Insufficient documentation

## 2018-03-07 HISTORY — DX: Atherosclerotic heart disease of native coronary artery without angina pectoris: I25.10

## 2018-03-07 NOTE — Progress Notes (Signed)
Derek [name] 72 y.o. male DOB [birth year] MRN [id number]       Nutrition  1. 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 Arthritis   . Coronary artery disease   . Diabetes mellitus    type 2  . 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 H/O: upper GI bleed 01/2001    related to nonsteroidal use, CLO test +(Med Tx- Prilosec, Amoxicillin, & Biaxin)  . Hypertension   . Insomnia   . Mental disorder    PTSD  . Peptic ulcer disease    history of gastric ulcer  . [place] ([place]    [facility]  . Sarcoidosis    late 1970s  . Scleritis   . Sleep apnea    cpap with O2 4L for 4 yrs  . Tubular adenoma 2004   Meds reviewed. Glimepiride, Metformin, Omega 3, Prednisone, vitamin D, Folvite noted  HT: Ht Readings from Last 1 Encounters:  03/07/18 5\' 9"  (1.753 m)    WT: Wt Readings from Last 5 Encounters:  03/07/18 190 lb 14.7 oz (86.6 kg)  06/14/12 197 lb (89.4 kg)  06/06/12 197 lb 11.2 oz (89.7 kg)     Body mass index is 28.19 kg/m.   Current tobacco use? No   Labs:  Lipid Panel 11/29/2017 Total cholesterol 126 Triglycerides  182 HDL     30 LDL direct    71  11/28/2017 HGBA1C 6.7  CBG (last 3)  No results for input(s): GLUCAP in the last 72 hours.  Nutrition Diagnosis ? Food-and nutrition-related knowledge deficit related to lack of exposure to information as related to diagnosis of: ? CVD ? DM ? Overweight related to excessive energy intake as evidenced by a Body mass index is 28.19 kg/m.  Nutrition Goal(s):  ? To be determined with pt  Plan:  Pt to attend nutrition classes ? Nutrition I ? Nutrition II ? Portion Distortion ? Diabetes Blitz ? Diabetes Q & A Will provide client-centered nutrition education as part of interdisciplinary care.   Monitor and evaluate progress toward nutrition goal with team.  Derek Mound, M.Ed, RD, LDN, CDE 03/07/2018 3:26 PM

## 2018-03-07 NOTE — Progress Notes (Signed)
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  allopurinol (ZYLOPRIM) 300 MG tablet, Take 300 mg by mouth daily., Disp: , Rfl:  .  cholecalciferol (VITAMIN D) 1000 UNITS tablet, Take 1,000 Units by mouth daily., Disp: , Rfl:  .  folic acid (FOLVITE) 1 MG tablet, Take 1 mg by mouth daily., Disp: , Rfl:  .  furosemide (LASIX) 20 MG tablet, Take 20 mg by mouth daily., Disp: , Rfl:  .  glimepiride (AMARYL) 1 MG tablet, Take 0.5 mg by mouth 2 (two) times daily. , Disp: , Rfl:  .  hydroxychloroquine (PLAQUENIL) 200 MG tablet, Take 200-400 mg by mouth 2 (two) times daily. Take 2 tabs in the morning and 1 tab in the evening, Disp: , Rfl:  .  lisinopril (PRINIVIL,ZESTRIL) 10 MG tablet, Take 5 mg by mouth 2 (two) times daily. , Disp: , Rfl:  .  metFORMIN (GLUCOPHAGE) 1000 MG tablet, Take 750 mg by mouth 2 (two) times daily with a meal. , Disp: , Rfl:  .  methotrexate (RHEUMATREX) 2.5 MG tablet, Take 12.5 mg by mouth 2 (two) times a week. Caution:Chemotherapy. Protect from light.; takes 5 tablets (12.5mg  total) on Fridays and Saturdays at 5 PM, Disp: , Rfl:  .  Omega-3 Fatty Acids (FISH OIL) 300 MG CAPS, Take 300 mg by mouth daily., Disp: , Rfl:  .  prazosin (MINIPRESS) 2 MG capsule, Take 1 mg by mouth at bedtime. , Disp: , Rfl:  .  predniSONE (DELTASONE) 5 MG tablet, Take 5 mg by mouth daily., Disp: , Rfl:  .   traZODone (DESYREL) 50 MG tablet, Take 50 mg by mouth at bedtime. , Disp: , Rfl:   Past Medical History: Past Medical History:  Diagnosis Date  . Arthritis   . Coronary artery disease   . Diabetes mellitus    type 2  . 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 H/O: upper GI bleed 01/2001    related to nonsteroidal use, CLO test +(Med Tx- Prilosec, Amoxicillin, & Biaxin)  . Hypertension   . Insomnia   . Mental disorder    PTSD  . Peptic ulcer disease    history of gastric ulcer  . PTSD (post-[place] stress disorder)    [facility]  . Sarcoidosis    late 1970s  . Scleritis   . Sleep apnea    cpap with O2 4L for 4 yrs  . Tubular adenoma 2004    Tobacco Use: Social History   Tobacco Use  Smoking Status Never Smoker  Smokeless Tobacco Never Used  Tobacco Comment   occ alcohol    Labs: Recent Review Flowsheet Data    There is no flowsheet data to display.      Capillary Blood Glucose: Lab Results  Component Value Date   GLUCAP 152 (H) 06/15/2012   GLUCAP 112 (H) 06/15/2012   GLUCAP 108 (H)  06/14/2012   GLUCAP 110 (H) 06/14/2012   GLUCAP 167 (H) 06/14/2012     Exercise Target Goals: Date: 03/07/18  Exercise Program Goal: Individual exercise prescription set using results from initial 6 min walk test and THRR while considering  patient's activity barriers and safety.   Exercise Prescription Goal: Initial exercise prescription builds to 30-45 minutes a day of aerobic activity, 2-3 days per week.  Home exercise guidelines will be given to patient during program as part of exercise prescription that the participant will acknowledge.  Activity Barriers & Risk Stratification: Activity Barriers & Cardiac Risk Stratification - 03/07/18 0930      Activity Barriers & Cardiac Risk Stratification   Activity Barriers  Deconditioning;Muscular Weakness;Incisional Pain;Arthritis    Cardiac Risk Stratification  High       6 Minute Walk: 6 Minute Walk    Row Name  03/07/18 1149         6 Minute Walk   Phase  Initial     Distance  1442 feet     Walk Time  6 minutes     # of Rest Breaks  0     MPH  2.7     METS  3.5     RPE  12     VO2 Peak  12.21     Symptoms  No     Resting HR  69 bpm     Resting BP  122/76     Resting Oxygen Saturation   96 %     Exercise Oxygen Saturation  during 6 min walk  97 %     Max Ex. HR  117 bpm     Max Ex. BP  146/82     2 Minute Post BP  118/78        Oxygen Initial Assessment:   Oxygen Re-Evaluation:   Oxygen Discharge (Final Oxygen Re-Evaluation):   Initial Exercise Prescription: Initial Exercise Prescription - 03/07/18 1100      Date of Initial Exercise RX and Referring Provider   Date  03/07/18    Referring Provider  Olena Leatherwood, MD ([facility] coverage)      Treadmill   MPH  2.2    Grade  0    Minutes  10    METs  2.68      Bike   Level  0.5    Minutes  10    METs  2.2      NuStep   Level  3    SPM  80    Minutes  10    METs  2      Prescription Details   Frequency (times per week)  3    Duration  Progress to 30 minutes of continuous aerobic without signs/symptoms of physical distress      Intensity   THRR 40-80% of Max Heartrate  60-121    Ratings of Perceived Exertion  11-13    Perceived Dyspnea  0-4      Progression   Progression  Continue to progress workloads to maintain intensity without signs/symptoms of physical distress.      Resistance Training   Training Prescription  Yes    Weight  2lbs    Reps  10-15       Perform Capillary Blood Glucose checks as needed.  Exercise Prescription Changes:   Exercise Comments:   Exercise Goals and Review:  Exercise Goals    Row Name 03/07/18 0930  Exercise Goals   Increase Physical Activity  Yes       Intervention  Provide advice, education, support and counseling about physical activity/exercise needs.;Develop an individualized exercise prescription for aerobic and resistive training based on  initial evaluation findings, risk stratification, comorbidities and participant's personal goals.       Expected Outcomes  Short Term: Attend rehab on a regular basis to increase amount of physical activity.;Long Term: Exercising regularly at least 3-5 days a week.;Long Term: Add in home exercise to make exercise part of routine and to increase amount of physical activity.       Increase Strength and Stamina  Yes return to gym-like routine and stay consistent with exercise routine       Intervention  Provide advice, education, support and counseling about physical activity/exercise needs.;Develop an individualized exercise prescription for aerobic and resistive training based on initial evaluation findings, risk stratification, comorbidities and participant's personal goals.       Expected Outcomes  Short Term: Increase workloads from initial exercise prescription for resistance, speed, and METs.;Short Term: Perform resistance training exercises routinely during rehab and add in resistance training at home;Long Term: Improve cardiorespiratory fitness, muscular endurance and strength as measured by increased METs and functional capacity (6MWT)       Able to understand and use rate of perceived exertion (RPE) scale  Yes       Intervention  Provide education and explanation on how to use RPE scale       Expected Outcomes  Short Term: Able to use RPE daily in rehab to express subjective intensity level;Long Term:  Able to use RPE to guide intensity level when exercising independently       Knowledge and understanding of Target Heart Rate Range (THRR)  Yes       Intervention  Provide education and explanation of THRR including how the numbers were predicted and where they are located for reference       Expected Outcomes  Short Term: Able to state/look up THRR;Long Term: Able to use THRR to govern intensity when exercising independently;Short Term: Able to use daily as guideline for intensity in rehab        Able to check pulse independently  Yes       Intervention  Provide education and demonstration on how to check pulse in carotid and radial arteries.;Review the importance of being able to check your own pulse for safety during independent exercise       Expected Outcomes  Short Term: Able to explain why pulse checking is important during independent exercise;Long Term: Able to check pulse independently and accurately       Understanding of Exercise Prescription  Yes       Intervention  Provide education, explanation, and written materials on patient's individual exercise prescription       Expected Outcomes  Short Term: Able to explain program exercise prescription;Long Term: Able to explain home exercise prescription to exercise independently          Exercise Goals Re-Evaluation :    Discharge Exercise Prescription (Final Exercise Prescription Changes):   Nutrition:  Target Goals: Understanding of nutrition guidelines, daily intake of sodium 1500mg , cholesterol 200mg , calories 30% from fat and 7% or less from saturated fats, daily to have 5 or more servings of fruits and vegetables.  Biometrics: Pre Biometrics - 03/07/18 1150      Pre Biometrics   Height  5\' 9"  (1.753 m)    Weight  190 lb 14.7  oz (86.6 kg)    Waist Circumference  39 inches    Hip Circumference  38.5 inches    Waist to Hip Ratio  1.01 %    BMI (Calculated)  28.18    Triceps Skinfold  10 mm    % Body Fat  25.5 %    Grip Strength  18 kg    Flexibility  0 in    Single Leg Stand  4.22 seconds        Nutrition Therapy Plan and Nutrition Goals:   Nutrition Assessments:   Nutrition Goals Re-Evaluation:   Nutrition Goals Re-Evaluation:   Nutrition Goals Discharge (Final Nutrition Goals Re-Evaluation):   Psychosocial: Target Goals: Acknowledge presence or absence of significant depression and/or stress, maximize coping skills, provide positive support system. Participant is able to verbalize types and  ability to use techniques and skills needed for reducing stress and depression.  Initial Review & Psychosocial Screening: Initial Psych Review & Screening - 03/07/18 1211      Initial Review   Current issues with  None Identified      Family Dynamics   Good Support System?  Yes      Barriers   Psychosocial barriers to participate in program  There are no identifiable barriers or psychosocial needs.      Screening Interventions   Interventions  Encouraged to exercise       Quality of Life Scores: Quality of Life - 03/07/18 1151      Quality of Life Scores   Health/Function Pre  30 %    Socioeconomic Pre  30 %    Psych/Spiritual Pre  30 %    Family Pre  30 %    GLOBAL Pre  30 %      Scores of 19 and below usually indicate a poorer quality of life in these areas.  A difference of  2-3 points is a clinically meaningful difference.  A difference of 2-3 points in the total score of the Quality of Life Index has been associated with significant improvement in overall quality of life, self-image, physical symptoms, and general health in studies assessing change in quality of life.  PHQ-9: Recent Review Flowsheet Data    There is no flowsheet data to display.     Interpretation of Total Score  Total Score Depression Severity:  1-4 = Minimal depression, 5-9 = Mild depression, 10-14 = Moderate depression, 15-19 = Moderately severe depression, 20-27 = Severe depression   Psychosocial Evaluation and Intervention:   Psychosocial Re-Evaluation:   Psychosocial Discharge (Final Psychosocial Re-Evaluation):   Vocational Rehabilitation: Provide vocational rehab assistance to qualifying candidates.   Vocational Rehab Evaluation & Intervention: Vocational Rehab - 03/07/18 1209      Initial Vocational Rehab Evaluation & Intervention   Assessment shows need for Vocational Rehabilitation  No Mr Bolin is retired and does not need vocational rehab at this time        Education: Education Goals: Education classes will be provided on a weekly basis, covering required topics. Participant will state understanding/return demonstration of topics presented.  Learning Barriers/Preferences: Learning Barriers/Preferences - 03/07/18 1144      Learning Barriers/Preferences   Learning Barriers  Sight    Learning Preferences  Skilled Demonstration       Education Topics: Count Your Pulse:  -Group instruction provided by verbal instruction, demonstration, patient participation and written materials to support subject.  Instructors address importance of being able to find your pulse and how to count your pulse when  at home without a heart monitor.  Patients get hands on experience counting their pulse with staff help and individually.   Heart Attack, Angina, and Risk Factor Modification:  -Group instruction provided by verbal instruction, video, and written materials to support subject.  Instructors address signs and symptoms of angina and heart attacks.    Also discuss risk factors for heart disease and how to make changes to improve heart health risk factors.   Functional Fitness:  -Group instruction provided by verbal instruction, demonstration, patient participation, and written materials to support subject.  Instructors address safety measures for doing things around the house.  Discuss how to get up and down off the floor, how to pick things up properly, how to safely get out of a chair without assistance, and balance training.   Meditation and Mindfulness:  -Group instruction provided by verbal instruction, patient participation, and written materials to support subject.  Instructor addresses importance of mindfulness and meditation practice to help reduce stress and improve awareness.  Instructor also leads participants through a meditation exercise.    Stretching for Flexibility and Mobility:  -Group instruction provided by verbal instruction,  patient participation, and written materials to support subject.  Instructors lead participants through series of stretches that are designed to increase flexibility thus improving mobility.  These stretches are additional exercise for major muscle groups that are typically performed during regular warm up and cool down.   Hands Only CPR:  -Group verbal, video, and participation provides a basic overview of AHA guidelines for community CPR. Role-play of emergencies allow participants the opportunity to practice calling for help and chest compression technique with discussion of AED use.   Hypertension: -Group verbal and written instruction that provides a basic overview of hypertension including the most recent diagnostic guidelines, risk factor reduction with self-care instructions and medication management.    Nutrition I class: Heart Healthy Eating:  -Group instruction provided by PowerPoint slides, verbal discussion, and written materials to support subject matter. The instructor gives an explanation and review of the Therapeutic Lifestyle Changes diet recommendations, which includes a discussion on lipid goals, dietary fat, sodium, fiber, plant stanol/sterol esters, sugar, and the components of a well-balanced, healthy diet.   Nutrition II class: Lifestyle Skills:  -Group instruction provided by PowerPoint slides, verbal discussion, and written materials to support subject matter. The instructor gives an explanation and review of label reading, grocery shopping for heart health, heart healthy recipe modifications, and ways to make healthier choices when eating out.   Diabetes Question & Answer:  -Group instruction provided by PowerPoint slides, verbal discussion, and written materials to support subject matter. The instructor gives an explanation and review of diabetes co-morbidities, pre- and post-prandial blood glucose goals, pre-exercise blood glucose goals, signs, symptoms, and  treatment of hypoglycemia and hyperglycemia, and foot care basics.   Diabetes Blitz:  -Group instruction provided by PowerPoint slides, verbal discussion, and written materials to support subject matter. The instructor gives an explanation and review of the physiology behind type 1 and type 2 diabetes, diabetes medications and rational behind using different medications, pre- and post-prandial blood glucose recommendations and Hemoglobin A1c goals, diabetes diet, and exercise including blood glucose guidelines for exercising safely.    Portion Distortion:  -Group instruction provided by PowerPoint slides, verbal discussion, written materials, and food models to support subject matter. The instructor gives an explanation of serving size versus portion size, changes in portions sizes over the last 20 years, and what consists of a serving from each  food group.   Stress Management:  -Group instruction provided by verbal instruction, video, and written materials to support subject matter.  Instructors review role of stress in heart disease and how to cope with stress positively.     Exercising on Your Own:  -Group instruction provided by verbal instruction, power point, and written materials to support subject.  Instructors discuss benefits of exercise, components of exercise, frequency and intensity of exercise, and end points for exercise.  Also discuss use of nitroglycerin and activating EMS.  Review options of places to exercise outside of rehab.  Review guidelines for sex with heart disease.   Cardiac Drugs I:  -Group instruction provided by verbal instruction and written materials to support subject.  Instructor reviews cardiac drug classes: antiplatelets, anticoagulants, beta blockers, and statins.  Instructor discusses reasons, side effects, and lifestyle considerations for each drug class.   Cardiac Drugs II:  -Group instruction provided by verbal instruction and written materials to  support subject.  Instructor reviews cardiac drug classes: angiotensin converting enzyme inhibitors (ACE-I), angiotensin II receptor blockers (ARBs), nitrates, and calcium channel blockers.  Instructor discusses reasons, side effects, and lifestyle considerations for each drug class.   Anatomy and Physiology of the Circulatory System:  Group verbal and written instruction and models provide basic cardiac anatomy and physiology, with the coronary electrical and arterial systems. Review of: AMI, Angina, Valve disease, Heart Failure, Peripheral Artery Disease, Cardiac Arrhythmia, Pacemakers, and the ICD.   Other Education:  -Group or individual verbal, written, or video instructions that support the educational goals of the cardiac rehab program.   Holiday Eating Survival Tips:  -Group instruction provided by PowerPoint slides, verbal discussion, and written materials to support subject matter. The instructor gives patients tips, tricks, and techniques to help them not only survive but enjoy the holidays despite the onslaught of food that accompanies the holidays.   Knowledge Questionnaire Score: Knowledge Questionnaire Score - 03/07/18 1145      Knowledge Questionnaire Score   Pre Score  17/24       Core Components/Risk Factors/Patient Goals at Admission: Personal Goals and Risk Factors at Admission - 03/07/18 1150      Core Components/Risk Factors/Patient Goals on Admission   Diabetes  Yes    Intervention  Provide education about signs/symptoms and action to take for hypo/hyperglycemia.;Provide education about proper nutrition, including hydration, and aerobic/resistive exercise prescription along with prescribed medications to achieve blood glucose in normal ranges: Fasting glucose 65-99 mg/dL    Expected Outcomes  Long Term: Attainment of HbA1C < 7%.;Short Term: Participant verbalizes understanding of the signs/symptoms and immediate care of hyper/hypoglycemia, proper foot care and  importance of medication, aerobic/resistive exercise and nutrition plan for blood glucose control.    Hypertension  Yes    Intervention  Provide education on lifestyle modifcations including regular physical activity/exercise, weight management, moderate sodium restriction and increased consumption of fresh fruit, vegetables, and low fat dairy, alcohol moderation, and smoking cessation.;Monitor prescription use compliance.    Expected Outcomes  Short Term: Continued assessment and intervention until BP is < 140/26mm HG in hypertensive participants. < 130/59mm HG in hypertensive participants with diabetes, heart failure or chronic kidney disease.;Long Term: Maintenance of blood pressure at goal levels.    Lipids  Yes    Intervention  Provide education and support for participant on nutrition & aerobic/resistive exercise along with prescribed medications to achieve LDL 70mg , HDL >40mg .    Expected Outcomes  Short Term: Participant states understanding of desired cholesterol values  and is compliant with medications prescribed. Participant is following exercise prescription and nutrition guidelines.;Long Term: Cholesterol controlled with medications as prescribed, with individualized exercise RX and with personalized nutrition plan. Value goals: LDL < 70mg , HDL > 40 mg.       Core Components/Risk Factors/Patient Goals Review:    Core Components/Risk Factors/Patient Goals at Discharge (Final Review):    ITP Comments: ITP Comments    Row Name 03/07/18 0919           ITP Comments  Dr. Fransico Him, Medical Director          Comments: Tarri Fuller attended orientation from [phone] to 1017 to review rules and guidelines for program. Completed 6 minute walk test, Intitial ITP, and exercise prescription.  VSS. Telemetry-Sinus Rhythm with intermittent PVC.  Asymptomatic.Will forward today's ECG tracings to Dr Arletha Grippe, Mr Grunder's cardiologist at the [facility] for review.Barnet Pall, RN,BSN 03/07/2018  12:24 PM

## 2018-03-13 ENCOUNTER — Encounter (HOSPITAL_COMMUNITY): Payer: Self-pay

## 2018-03-13 ENCOUNTER — Encounter (HOSPITAL_COMMUNITY): Payer: No Typology Code available for payment source

## 2018-03-13 ENCOUNTER — Encounter (HOSPITAL_COMMUNITY)
Admission: RE | Admit: 2018-03-13 | Discharge: 2018-03-13 | Disposition: A | Discharge status: Home / Self Care | Payer: No Typology Code available for payment source | Source: Ambulatory Visit | Attending: Cardiology | Admitting: Cardiology

## 2018-03-13 DIAGNOSIS — I2510 Atherosclerotic heart disease of native coronary artery without angina pectoris: Secondary | ICD-10-CM | POA: Insufficient documentation

## 2018-03-13 DIAGNOSIS — Z7984 Long term (current) use of oral hypoglycemic drugs: Secondary | ICD-10-CM | POA: Insufficient documentation

## 2018-03-13 DIAGNOSIS — F4310 Post-traumatic stress disorder, unspecified: Secondary | ICD-10-CM | POA: Insufficient documentation

## 2018-03-13 DIAGNOSIS — G4730 Sleep apnea, unspecified: Secondary | ICD-10-CM | POA: Diagnosis not present

## 2018-03-13 DIAGNOSIS — E119 Type 2 diabetes mellitus without complications: Secondary | ICD-10-CM | POA: Diagnosis not present

## 2018-03-13 DIAGNOSIS — I10 Essential (primary) hypertension: Secondary | ICD-10-CM | POA: Insufficient documentation

## 2018-03-13 DIAGNOSIS — Z79899 Other long term (current) drug therapy: Secondary | ICD-10-CM | POA: Insufficient documentation

## 2018-03-13 DIAGNOSIS — Z951 Presence of aortocoronary bypass graft: Secondary | ICD-10-CM | POA: Insufficient documentation

## 2018-03-13 LAB — GLUCOSE, CAPILLARY
Glucose-Capillary: 111 mg/dL — ABNORMAL HIGH (ref 65–99)
Glucose-Capillary: 89 mg/dL (ref 65–99)

## 2018-03-13 NOTE — Progress Notes (Signed)
Daily Session Note  Patient Details  Name: LEWIN PELLOW MRN: [id number] Date of Birth: [birth year] Referring Provider:     CARDIAC REHAB PHASE II ORIENTATION from 03/07/2018 in [place]  Referring Provider  Olena Leatherwood, MD ([facility] coverage)      Encounter Date: 03/13/2018  Check In: Session Check In - 03/13/18 1144      Check-In   Location  [facility]    Staff Present  Jiles Garter, RN BSN;Molly DiVincenzo, MS, ACSM RCEP, Exercise Physiologist;Joann Rion, RN, BSN    Supervising physician immediately available to respond to emergencies  [facility] immediately available    Physician(s)  Dr. Erlinda Hong     Medication changes reported      No    Fall or balance concerns reported     No    Tobacco Cessation  No Change    Warm-up and Cool-down  Performed as group-led instruction    Resistance Training Performed  Yes    VAD Patient?  No      Pain Assessment   Currently in Pain?  No/denies       Capillary Blood Glucose: Results for orders placed or performed during the hospital encounter of 03/13/18 (from the past 24 hour(s))  Glucose, capillary     Status: Abnormal   Collection Time: 03/13/18 10:56 AM  Result Value Ref Range   Glucose-Capillary 111 (H) 65 - 99 mg/dL  Glucose, capillary     Status: None   Collection Time: 03/13/18 12:18 PM  Result Value Ref Range   Glucose-Capillary 89 65 - 99 mg/dL      Social History   Tobacco Use  Smoking Status Never Smoker  Smokeless Tobacco Never Used  Tobacco Comment   occ alcohol    Goals Met:  Exercise tolerated well  Goals Unmet:  Not Applicable  Comments: Pt started cardiac rehab today.  Pt tolerated light exercise without difficulty. VSS, telemetry-SR with PVC's, asymptomatic.  Medication list reconciled. Pt denies barriers to medicaiton compliance.  PSYCHOSOCIAL ASSESSMENT:  PHQ-0. Pt exhibits positive coping skills, hopeful outlook with supportive family. No  psychosocial needs identified at this time, no psychosocial interventions necessary.  Pt oriented to exercise equipment and routine .  Understanding verbalized.    Dr. Fransico Him is Medical Director for Cardiac Rehab at [facility].

## 2018-03-15 ENCOUNTER — Encounter (HOSPITAL_COMMUNITY): Payer: No Typology Code available for payment source

## 2018-03-15 ENCOUNTER — Encounter (HOSPITAL_COMMUNITY)
Admission: RE | Admit: 2018-03-15 | Discharge: 2018-03-15 | Disposition: A | Discharge status: Home / Self Care | Payer: No Typology Code available for payment source | Source: Ambulatory Visit | Attending: Cardiology | Admitting: Cardiology

## 2018-03-15 DIAGNOSIS — I2510 Atherosclerotic heart disease of native coronary artery without angina pectoris: Secondary | ICD-10-CM | POA: Diagnosis not present

## 2018-03-15 DIAGNOSIS — Z951 Presence of aortocoronary bypass graft: Secondary | ICD-10-CM

## 2018-03-15 LAB — GLUCOSE, CAPILLARY
GLUCOSE-CAPILLARY: 99 mg/dL (ref 65–99)
GLUCOSE-CAPILLARY: 97 mg/dL (ref 65–99)

## 2018-03-15 NOTE — Progress Notes (Signed)
Derek [name] 72 y.o. male DOB 10/09/1946 MRN [id number]       Nutrition  Dx: s/p CABG   Meds reviewed. Glimepiride, Metformin, Omega 3, Prednisone, vitamin D, Folvite noted  Current tobacco use? No   Labs:  Lipid Panel 11/29/2017 Total cholesterol 126 Triglycerides  182 HDL     30 LDL direct    71  11/28/2017 HGBA1C 6.7  CBG (last 3)  Recent Labs    03/13/18 1218 03/15/18 1100 03/15/18 1134  GLUCAP 89 99 97   Food Recall 9:30 am 1 egg, 8 sausage links, 1 piece of toast, coffee with creamer and 3 splenda  10:45 am 4 peanut butter crackers  11:00 am lemonade, peanut butter/graham crackers  Note Spoke with pt. Pt's CBG too low to exercise today. Pt taking Glimepiride so pre-exercise CBG goal is > 110 mg/dL. Exercise and DM discussed. Per discussion, pt has changed his diet since his surgery "I'm not eating things that I shouldn't have been eating in the first place." Pt is diabetic. Last A1c indicates blood glucose well-controlled. Pt expressed understanding of the information reviewed.   Nutrition Diagnosis ? Food-and nutrition-related knowledge deficit related to lack of exposure to information as related to diagnosis of: ? CVD ? DM ? Overweight related to excessive energy intake as evidenced by a BMI 28.18  Nutrition Intervention ? Pt's individual nutrition plan reviewed with pt. ? Pt to try holding Glipizide on the mornings he comes to rehab.  ? Pt to continue eating a snack ~ 1 hour prior to starting exercise.   Nutrition Goal(s):  Pt to identify food quantities necessary to achieve weight loss of 5 lb at graduation from cardiac rehab. Wt loss goal wt of 185 lb desired.   Plan:  Pt to attend nutrition classes ? Nutrition I ? Nutrition II ? Portion Distortion ? Diabetes Blitz ? Diabetes Q & A Will provide client-centered nutrition education as part of interdisciplinary care.   Monitor and evaluate progress toward nutrition goal with team.  Derek Mound,  M.Ed, RD, LDN, CDE 03/15/2018 12:03 PM

## 2018-03-17 ENCOUNTER — Encounter (HOSPITAL_COMMUNITY)
Admission: RE | Admit: 2018-03-17 | Discharge: 2018-03-17 | Disposition: A | Discharge status: Home / Self Care | Payer: No Typology Code available for payment source | Source: Ambulatory Visit | Attending: Cardiology | Admitting: Cardiology

## 2018-03-17 ENCOUNTER — Encounter (HOSPITAL_COMMUNITY): Payer: No Typology Code available for payment source

## 2018-03-17 DIAGNOSIS — I2510 Atherosclerotic heart disease of native coronary artery without angina pectoris: Secondary | ICD-10-CM | POA: Diagnosis not present

## 2018-03-17 DIAGNOSIS — Z951 Presence of aortocoronary bypass graft: Secondary | ICD-10-CM

## 2018-03-17 LAB — GLUCOSE, CAPILLARY: GLUCOSE-CAPILLARY: 111 mg/dL — AB (ref 65–99)

## 2018-03-20 ENCOUNTER — Encounter (HOSPITAL_COMMUNITY): Payer: No Typology Code available for payment source

## 2018-03-20 ENCOUNTER — Encounter (HOSPITAL_COMMUNITY)
Admission: RE | Admit: 2018-03-20 | Discharge: 2018-03-20 | Disposition: A | Discharge status: Home / Self Care | Payer: No Typology Code available for payment source | Source: Ambulatory Visit | Attending: Cardiology | Admitting: Cardiology

## 2018-03-20 DIAGNOSIS — I2510 Atherosclerotic heart disease of native coronary artery without angina pectoris: Secondary | ICD-10-CM | POA: Diagnosis not present

## 2018-03-20 DIAGNOSIS — Z951 Presence of aortocoronary bypass graft: Secondary | ICD-10-CM

## 2018-03-22 ENCOUNTER — Encounter (HOSPITAL_COMMUNITY)
Admission: RE | Admit: 2018-03-22 | Discharge: 2018-03-22 | Disposition: A | Discharge status: Home / Self Care | Payer: No Typology Code available for payment source | Source: Ambulatory Visit | Attending: Cardiology | Admitting: Cardiology

## 2018-03-22 ENCOUNTER — Encounter (HOSPITAL_COMMUNITY): Payer: No Typology Code available for payment source

## 2018-03-22 DIAGNOSIS — Z951 Presence of aortocoronary bypass graft: Secondary | ICD-10-CM

## 2018-03-22 DIAGNOSIS — I2510 Atherosclerotic heart disease of native coronary artery without angina pectoris: Secondary | ICD-10-CM | POA: Diagnosis not present

## 2018-03-23 ENCOUNTER — Encounter (HOSPITAL_COMMUNITY): Payer: Self-pay

## 2018-03-23 NOTE — Progress Notes (Signed)
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  allopurinol (ZYLOPRIM) 300 MG tablet, Take 300 mg by mouth daily., Disp: , Rfl:  .  cholecalciferol (VITAMIN D) 1000 UNITS tablet, Take 1,000 Units by mouth daily., Disp: , Rfl:  .  folic acid (FOLVITE) 1 MG tablet, Take 1 mg by mouth daily., Disp: , Rfl:  .  furosemide (LASIX) 20 MG tablet, Take 20 mg by mouth daily., Disp: , Rfl:  .  glimepiride (AMARYL) 1 MG tablet, Take 0.5 mg by mouth 2 (two) times daily. , Disp: , Rfl:  .  hydroxychloroquine (PLAQUENIL) 200 MG tablet, Take 200-400 mg by mouth 2 (two) times daily. Take 2 tabs in the morning and 1 tab in the evening, Disp: , Rfl:  .  lisinopril (PRINIVIL,ZESTRIL) 10 MG tablet, Take 5 mg by mouth 2 (two) times daily. , Disp: , Rfl:  .  metFORMIN (GLUCOPHAGE) 1000 MG tablet, Take 750 mg by mouth 2 (two) times daily with a meal. , Disp: , Rfl:  .  methotrexate (RHEUMATREX) 2.5 MG tablet, Take 12.5 mg by mouth 2 (two) times a week. Caution:Chemotherapy. Protect from light.; takes 5 tablets (12.5mg  total) on Fridays and Saturdays at 5 PM, Disp: , Rfl:  .  Omega-3 Fatty Acids (FISH OIL) 300 MG CAPS, Take 300 mg by mouth daily., Disp: , Rfl:  .  prazosin (MINIPRESS) 2 MG capsule, Take 1 mg by mouth at bedtime. , Disp: , Rfl:  .  predniSONE (DELTASONE) 5 MG tablet, Take 5 mg by mouth daily., Disp: , Rfl:  .   traZODone (DESYREL) 50 MG tablet, Take 50 mg by mouth at bedtime. , Disp: , Rfl:   Past Medical History: Past Medical History:  Diagnosis Date  . Arthritis   . Coronary artery disease   . Diabetes mellitus    type 2  . 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 H/O: upper GI bleed 01/2001    related to nonsteroidal use, CLO test +(Med Tx- Prilosec, Amoxicillin, & Biaxin)  . Hypertension   . Insomnia   . Mental disorder    PTSD  . Peptic ulcer disease    history of gastric ulcer  . PTSD (post-[place] stress disorder)    [place]  . Sarcoidosis    late 1970s  . Scleritis   . Sleep apnea    cpap with O2 4L for 4 yrs  . Tubular adenoma 2004    Tobacco Use: Social History   Tobacco Use  Smoking Status Never Smoker  Smokeless Tobacco Never Used  Tobacco Comment   occ alcohol    Labs: Recent Review Flowsheet Data    There is no flowsheet data to display.      Capillary Blood Glucose: Lab Results  Component Value Date   GLUCAP 111 (H) 03/17/2018   GLUCAP 97 03/15/2018   GLUCAP 99 03/15/2018  GLUCAP 89 03/13/2018   GLUCAP 111 (H) 03/13/2018     Exercise Target Goals:    Exercise Program Goal: Individual exercise prescription set using results from initial 6 min walk test and THRR while considering  patient's activity barriers and safety.   Exercise Prescription Goal: Initial exercise prescription builds to 30-45 minutes a day of aerobic activity, 2-3 days per week.  Home exercise guidelines will be given to patient during program as part of exercise prescription that the participant will acknowledge.  Activity Barriers & Risk Stratification: Activity Barriers & Cardiac Risk Stratification - 03/07/18 0930      Activity Barriers & Cardiac Risk Stratification   Activity Barriers  Deconditioning;Muscular Weakness;Incisional Pain;Arthritis    Cardiac Risk Stratification  High       6 Minute Walk: 6 Minute Walk    Row Name 03/07/18 1149         6  Minute Walk   Phase  Initial     Distance  1442 feet     Walk Time  6 minutes     # of Rest Breaks  0     MPH  2.7     METS  3.5     RPE  12     VO2 Peak  12.21     Symptoms  No     Resting HR  69 bpm     Resting BP  122/76     Resting Oxygen Saturation   96 %     Exercise Oxygen Saturation  during 6 min walk  97 %     Max Ex. HR  117 bpm     Max Ex. BP  146/82     2 Minute Post BP  118/78        Oxygen Initial Assessment:   Oxygen Re-Evaluation:   Oxygen Discharge (Final Oxygen Re-Evaluation):   Initial Exercise Prescription: Initial Exercise Prescription - 03/07/18 1100      Date of Initial Exercise RX and Referring Provider   Date  03/07/18    Referring Provider  Olena Leatherwood, MD ([facility] coverage)      Treadmill   MPH  2.2    Grade  0    Minutes  10    METs  2.68      Bike   Level  0.5    Minutes  10    METs  2.2      NuStep   Level  3    SPM  80    Minutes  10    METs  2      Prescription Details   Frequency (times per week)  3    Duration  Progress to 30 minutes of continuous aerobic without signs/symptoms of physical distress      Intensity   THRR 40-80% of Max Heartrate  60-121    Ratings of Perceived Exertion  11-13    Perceived Dyspnea  0-4      Progression   Progression  Continue to progress workloads to maintain intensity without signs/symptoms of physical distress.      Resistance Training   Training Prescription  Yes    Weight  2lbs    Reps  10-15       Perform Capillary Blood Glucose checks as needed.  Exercise Prescription Changes: Exercise Prescription Changes    Row Name 03/13/18 1633 03/20/18 1400           Response to Exercise   Blood Pressure (Admit)  122/70  116/70      Blood Pressure (Exercise)  150/92  140/90      Blood Pressure (Exit)  132/70  112/60      Heart Rate (Admit)  80 bpm  91 bpm      Heart Rate (Exercise)  108 bpm  108 bpm      Heart Rate (Exit)  80 bpm  89 bpm      Rating of Perceived  Exertion (Exercise)  12  11      Perceived Dyspnea (Exercise)  0  0      Symptoms  None  None      Comments  Pt oriented to exercise equipment   -      Duration  Progress to 30 minutes of  aerobic without signs/symptoms of physical distress  Continue with 30 min of aerobic exercise without signs/symptoms of physical distress.      Intensity  THRR unchanged  THRR unchanged        Progression   Progression  Continue to progress workloads to maintain intensity without signs/symptoms of physical distress.  Continue to progress workloads to maintain intensity without signs/symptoms of physical distress.      Average METs  2.5  2.52        Resistance Training   Training Prescription  Yes  Yes      Weight  2lbs  3lbs      Reps  10-15  10-15      Time  -  10 Minutes        Interval Training   Interval Training  No  No        Treadmill   MPH  2.2  2.2      Grade  0  0      Minutes  10  10      METs  2.69  2.69        Bike   Level  0.5  0.8      Minutes  10  10      METs  2.09  2.09        NuStep   Level  3  3      SPM  85  95      Minutes  10  10      METs  2.8  2.1         Exercise Comments: Exercise Comments    Row Name 03/13/18 1636 03/20/18 1404 03/20/18 1406       Exercise Comments  Pt's first day of exericse. Pt oriented to exercise equipment. Will continue to monitor and progress pt as tolerated.   Pt has successfully completed 2 sessions of Cardiac Rehab. Pt is enjoying rehab thus far is able to exercise with no complications. Will continue to monitor and progress pt as tolerated.   Pt has successfully completed 4 sessions of Cardiac Rehab. Pt is enjoying rehab thus far is able to exercise with no complications. Will continue to monitor and progress pt as tolerated.         Exercise Goals and Review: Exercise Goals    Row Name 03/07/18 0930             Exercise Goals   Increase Physical Activity  Yes       Intervention  Provide advice, education, support and  counseling about physical activity/exercise needs.;Develop an individualized exercise prescription for aerobic and resistive training based on initial evaluation findings, risk stratification, comorbidities and participant's personal goals.  Expected Outcomes  Short Term: Attend rehab on a regular basis to increase amount of physical activity.;Long Term: Exercising regularly at least 3-5 days a week.;Long Term: Add in home exercise to make exercise part of routine and to increase amount of physical activity.       Increase Strength and Stamina  Yes return to gym-like routine and stay consistent with exercise routine       Intervention  Provide advice, education, support and counseling about physical activity/exercise needs.;Develop an individualized exercise prescription for aerobic and resistive training based on initial evaluation findings, risk stratification, comorbidities and participant's personal goals.       Expected Outcomes  Short Term: Increase workloads from initial exercise prescription for resistance, speed, and METs.;Short Term: Perform resistance training exercises routinely during rehab and add in resistance training at home;Long Term: Improve cardiorespiratory fitness, muscular endurance and strength as measured by increased METs and functional capacity (6MWT)       Able to understand and use rate of perceived exertion (RPE) scale  Yes       Intervention  Provide education and explanation on how to use RPE scale       Expected Outcomes  Short Term: Able to use RPE daily in rehab to express subjective intensity level;Long Term:  Able to use RPE to guide intensity level when exercising independently       Knowledge and understanding of Target Heart Rate Range (THRR)  Yes       Intervention  Provide education and explanation of THRR including how the numbers were predicted and where they are located for reference       Expected Outcomes  Short Term: Able to state/look up THRR;Long Term:  Able to use THRR to govern intensity when exercising independently;Short Term: Able to use daily as guideline for intensity in rehab       Able to check pulse independently  Yes       Intervention  Provide education and demonstration on how to check pulse in carotid and radial arteries.;Review the importance of being able to check your own pulse for safety during independent exercise       Expected Outcomes  Short Term: Able to explain why pulse checking is important during independent exercise;Long Term: Able to check pulse independently and accurately       Understanding of Exercise Prescription  Yes       Intervention  Provide education, explanation, and written materials on patient's individual exercise prescription       Expected Outcomes  Short Term: Able to explain program exercise prescription;Long Term: Able to explain home exercise prescription to exercise independently          Exercise Goals Re-Evaluation :    Discharge Exercise Prescription (Final Exercise Prescription Changes): Exercise Prescription Changes - 03/20/18 1400      Response to Exercise   Blood Pressure (Admit)  116/70    Blood Pressure (Exercise)  140/90    Blood Pressure (Exit)  112/60    Heart Rate (Admit)  91 bpm    Heart Rate (Exercise)  108 bpm    Heart Rate (Exit)  89 bpm    Rating of Perceived Exertion (Exercise)  11    Perceived Dyspnea (Exercise)  0    Symptoms  None    Duration  Continue with 30 min of aerobic exercise without signs/symptoms of physical distress.    Intensity  THRR unchanged      Progression   Progression  Continue to  progress workloads to maintain intensity without signs/symptoms of physical distress.    Average METs  2.52      Resistance Training   Training Prescription  Yes    Weight  3lbs    Reps  10-15    Time  10 Minutes      Interval Training   Interval Training  No      Treadmill   MPH  2.2    Grade  0    Minutes  10    METs  2.69      Bike   Level  0.8     Minutes  10    METs  2.09      NuStep   Level  3    SPM  95    Minutes  10    METs  2.1       Nutrition:  Target Goals: Understanding of nutrition guidelines, daily intake of sodium 1500mg , cholesterol 200mg , calories 30% from fat and 7% or less from saturated fats, daily to have 5 or more servings of fruits and vegetables.  Biometrics: Pre Biometrics - 03/07/18 1150      Pre Biometrics   Height  5\' 9"  (1.753 m)    Weight  190 lb 14.7 oz (86.6 kg)    Waist Circumference  39 inches    Hip Circumference  38.5 inches    Waist to Hip Ratio  1.01 %    BMI (Calculated)  28.18    Triceps Skinfold  10 mm    % Body Fat  25.5 %    Grip Strength  18 kg    Flexibility  0 in    Single Leg Stand  4.22 seconds        Nutrition Therapy Plan and Nutrition Goals: Nutrition Therapy & Goals - 03/15/18 1213      Nutrition Therapy   Diet  Consistent Carb, Heart Healthy      Personal Nutrition Goals   Nutrition Goal  Pt to identify food quantities necessary to achieve weight loss of 5 lb at graduation from cardiac rehab. Wt loss goal wt of 185 lb desired.       Intervention Plan   Intervention  Prescribe, educate and counsel regarding individualized specific dietary modifications aiming towards targeted core components such as weight, hypertension, lipid management, diabetes, heart failure and other comorbidities.    Expected Outcomes  Short Term Goal: Understand basic principles of dietary content, such as calories, fat, sodium, cholesterol and nutrients.;Long Term Goal: Adherence to prescribed nutrition plan.       Nutrition Assessments: Nutrition Assessments - 03/08/18 0915      MEDFICTS Scores   Pre Score  -- returned survey incomplete       Nutrition Goals Re-Evaluation:   Nutrition Goals Re-Evaluation:   Nutrition Goals Discharge (Final Nutrition Goals Re-Evaluation):   Psychosocial: Target Goals: Acknowledge presence or absence of significant depression and/or  stress, maximize coping skills, provide positive support system. Participant is able to verbalize types and ability to use techniques and skills needed for reducing stress and depression.  Initial Review & Psychosocial Screening: Initial Psych Review & Screening - 03/07/18 1211      Initial Review   Current issues with  None Identified      Family Dynamics   Good Support System?  Yes      Barriers   Psychosocial barriers to participate in program  There are no identifiable barriers or psychosocial needs.  Screening Interventions   Interventions  Encouraged to exercise       Quality of Life Scores: Quality of Life - 03/07/18 1151      Quality of Life Scores   Health/Function Pre  30 %    Socioeconomic Pre  30 %    Psych/Spiritual Pre  30 %    Family Pre  30 %    GLOBAL Pre  30 %      Scores of 19 and below usually indicate a poorer quality of life in these areas.  A difference of  2-3 points is a clinically meaningful difference.  A difference of 2-3 points in the total score of the Quality of Life Index has been associated with significant improvement in overall quality of life, self-image, physical symptoms, and general health in studies assessing change in quality of life.  PHQ-9: Recent Review Flowsheet Data    Depression screen [facility] 2/9 03/13/2018   Decreased Interest 0   Down, Depressed, Hopeless 0   PHQ - 2 Score 0     Interpretation of Total Score  Total Score Depression Severity:  1-4 = Minimal depression, 5-9 = Mild depression, 10-14 = Moderate depression, 15-19 = Moderately severe depression, 20-27 = Severe depression   Psychosocial Evaluation and Intervention: Psychosocial Evaluation - 03/13/18 1545      Psychosocial Evaluation & Interventions   Interventions  Encouraged to exercise with the program and follow exercise prescription    Comments  No psychosocial needs identified. No interventions necessary.  Pt has a support group through the [place] that he has  attended before and will attend if needed. Pt looks to his faith and enjoys helping out at his church.     Expected Outcomes  Cuyler will exhibit a positive outlook with good coping skills.     Continue Psychosocial Services   No Follow up required       Psychosocial Re-Evaluation: Psychosocial Re-Evaluation    [place] Name 03/23/18 0751             Psychosocial Re-Evaluation   Current issues with  None Identified       Comments  No psychosocial needs identified. No intervention necessary.       Expected Outcomes  Demarian will continue to exhibit a positive outlook utilizing good coping skills.       Interventions  Encouraged to attend Cardiac Rehabilitation for the exercise       Continue Psychosocial Services   No Follow up required          Psychosocial Discharge (Final Psychosocial Re-Evaluation): Psychosocial Re-Evaluation - 03/23/18 0751      Psychosocial Re-Evaluation   Current issues with  None Identified    Comments  No psychosocial needs identified. No intervention necessary.    Expected Outcomes  Decoda will continue to exhibit a positive outlook utilizing good coping skills.    Interventions  Encouraged to attend Cardiac Rehabilitation for the exercise    Continue Psychosocial Services   No Follow up required       Vocational Rehabilitation: Provide vocational rehab assistance to qualifying candidates.   Vocational Rehab Evaluation & Intervention: Vocational Rehab - 03/07/18 1209      Initial Vocational Rehab Evaluation & Intervention   Assessment shows need for Vocational Rehabilitation  No Mr Lamar is retired and does not need vocational rehab at this time       Education: Education Goals: Education classes will be provided on a weekly basis, covering required topics. Participant will  state understanding/return demonstration of topics presented.  Learning Barriers/Preferences: Learning Barriers/Preferences - 03/07/18 1144      Learning  Barriers/Preferences   Learning Barriers  Sight    Learning Preferences  Skilled Demonstration       Education Topics: Count Your Pulse:  -Group instruction provided by verbal instruction, demonstration, patient participation and written materials to support subject.  Instructors address importance of being able to find your pulse and how to count your pulse when at home without a heart monitor.  Patients get hands on experience counting their pulse with staff help and individually.   Heart Attack, Angina, and Risk Factor Modification:  -Group instruction provided by verbal instruction, video, and written materials to support subject.  Instructors address signs and symptoms of angina and heart attacks.    Also discuss risk factors for heart disease and how to make changes to improve heart health risk factors.   Functional Fitness:  -Group instruction provided by verbal instruction, demonstration, patient participation, and written materials to support subject.  Instructors address safety measures for doing things around the house.  Discuss how to get up and down off the floor, how to pick things up properly, how to safely get out of a chair without assistance, and balance training.   Meditation and Mindfulness:  -Group instruction provided by verbal instruction, patient participation, and written materials to support subject.  Instructor addresses importance of mindfulness and meditation practice to help reduce stress and improve awareness.  Instructor also leads participants through a meditation exercise.    Stretching for Flexibility and Mobility:  -Group instruction provided by verbal instruction, patient participation, and written materials to support subject.  Instructors lead participants through series of stretches that are designed to increase flexibility thus improving mobility.  These stretches are additional exercise for major muscle groups that are typically performed during  regular warm up and cool down.   Hands Only CPR:  -Group verbal, video, and participation provides a basic overview of AHA guidelines for community CPR. Role-play of emergencies allow participants the opportunity to practice calling for help and chest compression technique with discussion of AED use.   Hypertension: -Group verbal and written instruction that provides a basic overview of hypertension including the most recent diagnostic guidelines, risk factor reduction with self-care instructions and medication management.    Nutrition I class: Heart Healthy Eating:  -Group instruction provided by PowerPoint slides, verbal discussion, and written materials to support subject matter. The instructor gives an explanation and review of the Therapeutic Lifestyle Changes diet recommendations, which includes a discussion on lipid goals, dietary fat, sodium, fiber, plant stanol/sterol esters, sugar, and the components of a well-balanced, healthy diet.   Nutrition II class: Lifestyle Skills:  -Group instruction provided by PowerPoint slides, verbal discussion, and written materials to support subject matter. The instructor gives an explanation and review of label reading, grocery shopping for heart health, heart healthy recipe modifications, and ways to make healthier choices when eating out.   Diabetes Question & Answer:  -Group instruction provided by PowerPoint slides, verbal discussion, and written materials to support subject matter. The instructor gives an explanation and review of diabetes co-morbidities, pre- and post-prandial blood glucose goals, pre-exercise blood glucose goals, signs, symptoms, and treatment of hypoglycemia and hyperglycemia, and foot care basics.   Diabetes Blitz:  -Group instruction provided by PowerPoint slides, verbal discussion, and written materials to support subject matter. The instructor gives an explanation and review of the physiology behind type 1 and type 2  diabetes, diabetes medications and rational behind using different medications, pre- and post-prandial blood glucose recommendations and Hemoglobin A1c goals, diabetes diet, and exercise including blood glucose guidelines for exercising safely.    Portion Distortion:  -Group instruction provided by PowerPoint slides, verbal discussion, written materials, and food models to support subject matter. The instructor gives an explanation of serving size versus portion size, changes in portions sizes over the last 20 years, and what consists of a serving from each food group.   Stress Management:  -Group instruction provided by verbal instruction, video, and written materials to support subject matter.  Instructors review role of stress in heart disease and how to cope with stress positively.     Exercising on Your Own:  -Group instruction provided by verbal instruction, power point, and written materials to support subject.  Instructors discuss benefits of exercise, components of exercise, frequency and intensity of exercise, and end points for exercise.  Also discuss use of nitroglycerin and activating EMS.  Review options of places to exercise outside of rehab.  Review guidelines for sex with heart disease.   Cardiac Drugs I:  -Group instruction provided by verbal instruction and written materials to support subject.  Instructor reviews cardiac drug classes: antiplatelets, anticoagulants, beta blockers, and statins.  Instructor discusses reasons, side effects, and lifestyle considerations for each drug class.   Cardiac Drugs II:  -Group instruction provided by verbal instruction and written materials to support subject.  Instructor reviews cardiac drug classes: angiotensin converting enzyme inhibitors (ACE-I), angiotensin II receptor blockers (ARBs), nitrates, and calcium channel blockers.  Instructor discusses reasons, side effects, and lifestyle considerations for each drug class.   CARDIAC REHAB  PHASE II EXERCISE from 03/22/2018 in [place]  Date  03/22/18  Instruction Review Code  2- Demonstrated Understanding      Anatomy and Physiology of the Circulatory System:  Group verbal and written instruction and models provide basic cardiac anatomy and physiology, with the coronary electrical and arterial systems. Review of: AMI, Angina, Valve disease, Heart Failure, Peripheral Artery Disease, Cardiac Arrhythmia, Pacemakers, and the ICD.   CARDIAC REHAB PHASE II EXERCISE from 03/22/2018 in [place]  Date  03/15/18  Instruction Review Code  2- Demonstrated Understanding      Other Education:  -Group or individual verbal, written, or video instructions that support the educational goals of the cardiac rehab program.   Holiday Eating Survival Tips:  -Group instruction provided by PowerPoint slides, verbal discussion, and written materials to support subject matter. The instructor gives patients tips, tricks, and techniques to help them not only survive but enjoy the holidays despite the onslaught of food that accompanies the holidays.   Knowledge Questionnaire Score: Knowledge Questionnaire Score - 03/07/18 1145      Knowledge Questionnaire Score   Pre Score  17/24       Core Components/Risk Factors/Patient Goals at Admission: Personal Goals and Risk Factors at Admission - 03/07/18 1150      Core Components/Risk Factors/Patient Goals on Admission   Diabetes  Yes    Intervention  Provide education about signs/symptoms and action to take for hypo/hyperglycemia.;Provide education about proper nutrition, including hydration, and aerobic/resistive exercise prescription along with prescribed medications to achieve blood glucose in normal ranges: Fasting glucose 65-99 mg/dL    Expected Outcomes  Long Term: Attainment of HbA1C < 7%.;Short Term: Participant verbalizes understanding of the signs/symptoms and immediate care of  hyper/hypoglycemia, proper foot care and importance of medication,  aerobic/resistive exercise and nutrition plan for blood glucose control.    Hypertension  Yes    Intervention  Provide education on lifestyle modifcations including regular physical activity/exercise, weight management, moderate sodium restriction and increased consumption of fresh fruit, vegetables, and low fat dairy, alcohol moderation, and smoking cessation.;Monitor prescription use compliance.    Expected Outcomes  Short Term: Continued assessment and intervention until BP is < 140/15mm HG in hypertensive participants. < 130/36mm HG in hypertensive participants with diabetes, heart failure or chronic kidney disease.;Long Term: Maintenance of blood pressure at goal levels.    Lipids  Yes    Intervention  Provide education and support for participant on nutrition & aerobic/resistive exercise along with prescribed medications to achieve LDL 70mg , HDL >40mg .    Expected Outcomes  Short Term: Participant states understanding of desired cholesterol values and is compliant with medications prescribed. Participant is following exercise prescription and nutrition guidelines.;Long Term: Cholesterol controlled with medications as prescribed, with individualized exercise RX and with personalized nutrition plan. Value goals: LDL < 70mg , HDL > 40 mg.       Core Components/Risk Factors/Patient Goals Review:  Goals and Risk Factor Review    Row Name 03/13/18 1542 03/23/18 0750           Core Components/Risk Factors/Patient Goals Review   Personal Goals Review  Lipids;Diabetes;Hypertension  Lipids;Diabetes;Hypertension      Review  Pt with multiple CAD RF willing to participate in [place] is looking forward to increasing his strength and stamina.  Pt with multiple CAD RF willing to participate in [place] is looking forward to increasing his strength and stamina.      Expected Outcomes  Pt will continue to participate  in CR exercise, nutrition, and lifestyle modification opportunities.  Pt will continue to participate in CR exercise, nutrition, and lifestyle modification opportunities.         Core Components/Risk Factors/Patient Goals at Discharge (Final Review):  Goals and Risk Factor Review - 03/23/18 0750      Core Components/Risk Factors/Patient Goals Review   Personal Goals Review  Lipids;Diabetes;Hypertension    Review  Pt with multiple CAD RF willing to participate in [place] is looking forward to increasing his strength and stamina.    Expected Outcomes  Pt will continue to participate in CR exercise, nutrition, and lifestyle modification opportunities.       ITP Comments: ITP Comments    Row Name 03/07/18 0919 03/23/18 0750         ITP Comments  Dr. Fransico Him, Medical Director  30 Day ITP Review.  Mikie has started CR and is tolerating exercise well.         Comments: See ITP Comments

## 2018-03-24 ENCOUNTER — Encounter (HOSPITAL_COMMUNITY)
Admission: RE | Admit: 2018-03-24 | Discharge: 2018-03-24 | Disposition: A | Discharge status: Home / Self Care | Payer: No Typology Code available for payment source | Source: Ambulatory Visit | Attending: Cardiology | Admitting: Cardiology

## 2018-03-24 ENCOUNTER — Encounter (HOSPITAL_COMMUNITY): Payer: No Typology Code available for payment source

## 2018-03-24 DIAGNOSIS — I2510 Atherosclerotic heart disease of native coronary artery without angina pectoris: Secondary | ICD-10-CM | POA: Diagnosis not present

## 2018-03-24 DIAGNOSIS — Z951 Presence of aortocoronary bypass graft: Secondary | ICD-10-CM

## 2018-03-27 ENCOUNTER — Encounter (HOSPITAL_COMMUNITY)
Admission: RE | Admit: 2018-03-27 | Discharge: 2018-03-27 | Disposition: A | Discharge status: Home / Self Care | Payer: No Typology Code available for payment source | Source: Ambulatory Visit | Attending: Cardiology | Admitting: Cardiology

## 2018-03-27 ENCOUNTER — Encounter (HOSPITAL_COMMUNITY): Payer: No Typology Code available for payment source

## 2018-03-27 DIAGNOSIS — Z951 Presence of aortocoronary bypass graft: Secondary | ICD-10-CM

## 2018-03-27 DIAGNOSIS — I2510 Atherosclerotic heart disease of native coronary artery without angina pectoris: Secondary | ICD-10-CM | POA: Diagnosis not present

## 2018-03-27 LAB — GLUCOSE, CAPILLARY
Glucose-Capillary: 75 mg/dL (ref 65–99)
Glucose-Capillary: 156 mg/dL — ABNORMAL HIGH (ref 65–99)
Glucose-Capillary: 97 mg/dL (ref 65–99)

## 2018-03-27 NOTE — Progress Notes (Signed)
Incomplete Session Note  Patient Details  Name: RANKIN COOLMAN MRN: [id number] Date of Birth: Dec 15, 1945 Referring Provider:     CARDIAC REHAB PHASE II ORIENTATION from 03/07/2018 in [place]  Referring Provider  Olena Leatherwood, MD ([place] coverage)      [name] did not complete his rehab session.  Pt with low blood Glucose pre exercise - 75.  Pt stated that his fasting blood glucose at home was 90.  Pt took his usual medications - metformin and amaryl. Pt had a bowl of corn flakes this morning for breakfast. Pt states his wife told him that 4 days of the week he will need to eat cereal and she will fix him a "good" breakfast.  Pt given banana, graham crackers and lemonade.  Recheck in 15 minutes - 97.  Pt given peanut butter crackers and doughnut.  Rechecked in 15 minutes -156 Pt advised that if his fasting blood glucose is less than 100 on exercise days to hold his medication until he is done with his rehab for the day.  Pt given options for breakfast that had higher protein and fiber. Pt encouraged to attend the diabetes class on Friday before exercise. Pt verbalized understanding. Cherre Huger, BSN Cardiac and Training and development officer

## 2018-03-29 ENCOUNTER — Encounter (HOSPITAL_COMMUNITY): Payer: No Typology Code available for payment source

## 2018-03-29 ENCOUNTER — Encounter (HOSPITAL_COMMUNITY)
Admission: RE | Admit: 2018-03-29 | Discharge: 2018-03-29 | Disposition: A | Discharge status: Home / Self Care | Payer: No Typology Code available for payment source | Source: Ambulatory Visit | Attending: Cardiology | Admitting: Cardiology

## 2018-03-29 DIAGNOSIS — I2510 Atherosclerotic heart disease of native coronary artery without angina pectoris: Secondary | ICD-10-CM | POA: Diagnosis not present

## 2018-03-29 DIAGNOSIS — Z951 Presence of aortocoronary bypass graft: Secondary | ICD-10-CM

## 2018-03-29 LAB — GLUCOSE, CAPILLARY: GLUCOSE-CAPILLARY: 194 mg/dL — AB (ref 65–99)

## 2018-03-31 ENCOUNTER — Encounter (HOSPITAL_COMMUNITY): Payer: No Typology Code available for payment source

## 2018-04-03 ENCOUNTER — Encounter (HOSPITAL_COMMUNITY)
Admission: RE | Admit: 2018-04-03 | Discharge: 2018-04-03 | Disposition: A | Discharge status: Home / Self Care | Payer: No Typology Code available for payment source | Source: Ambulatory Visit | Attending: Cardiology | Admitting: Cardiology

## 2018-04-03 ENCOUNTER — Encounter (HOSPITAL_COMMUNITY): Payer: No Typology Code available for payment source

## 2018-04-03 DIAGNOSIS — I2510 Atherosclerotic heart disease of native coronary artery without angina pectoris: Secondary | ICD-10-CM | POA: Diagnosis not present

## 2018-04-03 DIAGNOSIS — Z951 Presence of aortocoronary bypass graft: Secondary | ICD-10-CM

## 2018-04-03 LAB — GLUCOSE, CAPILLARY: GLUCOSE-CAPILLARY: 108 mg/dL — AB (ref 65–99)

## 2018-04-03 NOTE — Progress Notes (Addendum)
I have reviewed a Home Exercise Prescription with [name] . Derek [name] is not  currently exercising at home. The patient was advised to walk 2-3 days a week for 30-45 minutes. Pt plans to [facility] to completed home exercise. Derek [name] and I discussed how to progress their exercise prescription. The patient stated that they understand the exercise prescription. We reviewed exercise guidelines, target heart rate during exercise, RPE Scale, weather conditions, NTG use, endpoints for exercise, warmup and cool down. Patient is encouraged to come to me with any questions. I will continue to follow up with the patient to assist them with progression and safety.    Carma Lair MS, ACSM CEP 04/03/2018 1:46 PM

## 2018-04-05 ENCOUNTER — Encounter (HOSPITAL_COMMUNITY): Payer: No Typology Code available for payment source

## 2018-04-05 ENCOUNTER — Encounter (HOSPITAL_COMMUNITY)
Admission: RE | Admit: 2018-04-05 | Discharge: 2018-04-05 | Disposition: A | Discharge status: Home / Self Care | Payer: No Typology Code available for payment source | Source: Ambulatory Visit | Attending: Cardiology | Admitting: Cardiology

## 2018-04-05 DIAGNOSIS — I2510 Atherosclerotic heart disease of native coronary artery without angina pectoris: Secondary | ICD-10-CM | POA: Diagnosis not present

## 2018-04-05 DIAGNOSIS — Z951 Presence of aortocoronary bypass graft: Secondary | ICD-10-CM

## 2018-04-05 LAB — GLUCOSE, CAPILLARY: GLUCOSE-CAPILLARY: 91 mg/dL (ref 70–99)

## 2018-04-07 ENCOUNTER — Encounter (HOSPITAL_COMMUNITY): Payer: No Typology Code available for payment source

## 2018-04-07 ENCOUNTER — Encounter (HOSPITAL_COMMUNITY)
Admission: RE | Admit: 2018-04-07 | Discharge: 2018-04-07 | Disposition: A | Discharge status: Home / Self Care | Payer: No Typology Code available for payment source | Source: Ambulatory Visit | Attending: Cardiology | Admitting: Cardiology

## 2018-04-07 DIAGNOSIS — Z951 Presence of aortocoronary bypass graft: Secondary | ICD-10-CM

## 2018-04-07 DIAGNOSIS — I2510 Atherosclerotic heart disease of native coronary artery without angina pectoris: Secondary | ICD-10-CM | POA: Diagnosis not present

## 2018-04-07 LAB — GLUCOSE, CAPILLARY: GLUCOSE-CAPILLARY: 181 mg/dL — AB (ref 70–99)

## 2018-04-07 NOTE — Progress Notes (Signed)
Derek [name] 72 y.o. male Nutrition Note Spoke with pt. Nutrition Plan and Nutrition Survey goals reviewed with pt. Pt is following a Heart Healthy diet. Pt wants to lose wt. Pt has been trying to lose wt by eating smaller portions, decreasing fatty cuts of meat, increasing lean protein, eating whole grains. Pt shared he is not eating regularly across the day. Discussed the importance of eating regularly across the day, and reviewed the importance of consistent carbohydrate intake across the day as well as pt is diabetic. Last A1c indicates blood glucose well-controlled. This Probation officer went over Diabetes Education test results.   Per discussion, pt does not use canned/convenience foods often. Pt rarely adds salt to food. Pt eats out infrequently. Pt expressed understanding of the information reviewed. Pt aware of nutrition education classes offered.  No results found for: HGBA1C  Wt Readings from Last 3 Encounters:  03/07/18 190 lb 14.7 oz (86.6 kg)  06/14/12 197 lb (89.4 kg)  06/06/12 197 lb 11.2 oz (89.7 kg)    Nutrition Diagnosis ? Food-and nutrition-related knowledge deficit related to lack of exposure to information as related to diagnosis of: ? CVD ? DM  ? Overweight related to excessive energy intake as evidenced by a BMI of 28.19  Nutrition Intervention ? Pt's individual nutrition plan reviewed with pt. ? Benefits of adopting Heart Healthy diet discussed when [employer] reviewed.   ? Pt given handouts for: ? Nutrition I class ? Nutrition II class ? Diabetes Blitz Class ? Consistent vit K diet ? low sodium ? DM ? pre-diabetes ? Diabetes Q & A class ? Continue client-centered nutrition education by RD, as part of interdisciplinary care.  Goal(s)  ? Pt to identify and limit food sources of saturated fat, trans fat, and sodium ? Pt to identify food quantities necessary to achieve weight loss of 5 lb at graduation from cardiac rehab.    Laurina Bustle, MS, RD, LDN 04/07/2018 1:39  PM

## 2018-04-10 ENCOUNTER — Encounter (HOSPITAL_COMMUNITY)
Admission: RE | Admit: 2018-04-10 | Discharge: 2018-04-10 | Disposition: A | Discharge status: Home / Self Care | Payer: No Typology Code available for payment source | Source: Ambulatory Visit | Attending: Cardiology | Admitting: Cardiology

## 2018-04-10 ENCOUNTER — Encounter (HOSPITAL_COMMUNITY): Payer: No Typology Code available for payment source

## 2018-04-10 DIAGNOSIS — Z951 Presence of aortocoronary bypass graft: Secondary | ICD-10-CM | POA: Insufficient documentation

## 2018-04-10 DIAGNOSIS — I2510 Atherosclerotic heart disease of native coronary artery without angina pectoris: Secondary | ICD-10-CM | POA: Insufficient documentation

## 2018-04-10 DIAGNOSIS — Z7984 Long term (current) use of oral hypoglycemic drugs: Secondary | ICD-10-CM | POA: Insufficient documentation

## 2018-04-10 DIAGNOSIS — Z79899 Other long term (current) drug therapy: Secondary | ICD-10-CM | POA: Insufficient documentation

## 2018-04-10 DIAGNOSIS — I10 Essential (primary) hypertension: Secondary | ICD-10-CM | POA: Diagnosis not present

## 2018-04-10 DIAGNOSIS — E119 Type 2 diabetes mellitus without complications: Secondary | ICD-10-CM | POA: Diagnosis not present

## 2018-04-10 DIAGNOSIS — G4730 Sleep apnea, unspecified: Secondary | ICD-10-CM | POA: Insufficient documentation

## 2018-04-10 DIAGNOSIS — F4310 Post-traumatic stress disorder, unspecified: Secondary | ICD-10-CM | POA: Diagnosis not present

## 2018-04-10 LAB — GLUCOSE, CAPILLARY: GLUCOSE-CAPILLARY: 111 mg/dL — AB (ref 70–99)

## 2018-04-12 ENCOUNTER — Encounter (HOSPITAL_COMMUNITY): Payer: No Typology Code available for payment source

## 2018-04-14 ENCOUNTER — Encounter (HOSPITAL_COMMUNITY): Payer: No Typology Code available for payment source

## 2018-04-17 ENCOUNTER — Encounter (HOSPITAL_COMMUNITY): Payer: No Typology Code available for payment source

## 2018-04-17 ENCOUNTER — Encounter (HOSPITAL_COMMUNITY)
Admission: RE | Admit: 2018-04-17 | Discharge: 2018-04-17 | Disposition: A | Discharge status: Home / Self Care | Payer: No Typology Code available for payment source | Source: Ambulatory Visit | Attending: Cardiology | Admitting: Cardiology

## 2018-04-17 DIAGNOSIS — Z951 Presence of aortocoronary bypass graft: Secondary | ICD-10-CM

## 2018-04-17 DIAGNOSIS — I2510 Atherosclerotic heart disease of native coronary artery without angina pectoris: Secondary | ICD-10-CM | POA: Diagnosis not present

## 2018-04-17 LAB — GLUCOSE, CAPILLARY: Glucose-Capillary: 154 mg/dL — ABNORMAL HIGH (ref 70–99)

## 2018-04-19 ENCOUNTER — Encounter (HOSPITAL_COMMUNITY)
Admission: RE | Admit: 2018-04-19 | Discharge: 2018-04-19 | Disposition: A | Discharge status: Home / Self Care | Payer: No Typology Code available for payment source | Source: Ambulatory Visit | Attending: Cardiology | Admitting: Cardiology

## 2018-04-19 ENCOUNTER — Encounter (HOSPITAL_COMMUNITY): Payer: Self-pay

## 2018-04-19 ENCOUNTER — Encounter (HOSPITAL_COMMUNITY): Payer: No Typology Code available for payment source

## 2018-04-19 DIAGNOSIS — Z951 Presence of aortocoronary bypass graft: Secondary | ICD-10-CM

## 2018-04-19 DIAGNOSIS — I2510 Atherosclerotic heart disease of native coronary artery without angina pectoris: Secondary | ICD-10-CM | POA: Diagnosis not present

## 2018-04-20 NOTE — Progress Notes (Signed)
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  allopurinol (ZYLOPRIM) 300 MG tablet, Take 300 mg by mouth daily., Disp: , Rfl:  .  cholecalciferol (VITAMIN D) 1000 UNITS tablet, Take 1,000 Units by mouth daily., Disp: , Rfl:  .  folic acid (FOLVITE) 1 MG tablet, Take 1 mg by mouth daily., Disp: , Rfl:  .  furosemide (LASIX) 20 MG tablet, Take 20 mg by mouth daily., Disp: , Rfl:  .  glimepiride (AMARYL) 1 MG tablet, Take 0.5 mg by mouth 2 (two) times daily. , Disp: , Rfl:  .  hydroxychloroquine (PLAQUENIL) 200 MG tablet, Take 200-400 mg by mouth 2 (two) times daily. Take 2 tabs in the morning and 1 tab in the evening, Disp: , Rfl:  .  lisinopril (PRINIVIL,ZESTRIL) 10 MG tablet, Take 5 mg by mouth 2 (two) times daily. , Disp: , Rfl:  .  metFORMIN (GLUCOPHAGE) 1000 MG tablet, Take 750 mg by mouth 2 (two) times daily with a meal. , Disp: , Rfl:  .  methotrexate (RHEUMATREX) 2.5 MG tablet, Take 12.5 mg by mouth 2 (two) times a week. Caution:Chemotherapy. Protect from light.; takes 5 tablets (12.5mg  total) on Fridays and Saturdays at 5 PM, Disp: , Rfl:  .  Omega-3 Fatty Acids (FISH OIL) 300 MG CAPS, Take 300 mg by mouth daily., Disp: , Rfl:  .  prazosin (MINIPRESS) 2 MG capsule, Take 1 mg by mouth at bedtime. , Disp: , Rfl:  .  predniSONE (DELTASONE) 5 MG tablet, Take 5 mg by mouth daily., Disp: , Rfl:  .  traZODone (DESYREL) 50 MG tablet, Take 50 mg by mouth at bedtime. ,  Disp: , Rfl:   Past Medical History: Past Medical History:  Diagnosis Date  . Arthritis   . Coronary artery disease   . Diabetes mellitus    type 2  . 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 H/O: upper GI bleed 01/2001    related to nonsteroidal use, CLO test +(Med Tx- Prilosec, Amoxicillin, & Biaxin)  . Hypertension   . Insomnia   . Mental disorder    PTSD  . Peptic ulcer disease    history of gastric ulcer  . PTSD (post-traumatic stress disorder)    [place]  . Sarcoidosis    late 1970s  . Scleritis   . Sleep apnea    cpap with O2 4L for 4 yrs  . Tubular adenoma 2004    Tobacco Use: Social History   Tobacco Use  Smoking Status Never Smoker  Smokeless Tobacco Never Used  Tobacco Comment   occ alcohol    Labs: Recent Review Flowsheet Data    There is no flowsheet data to display.      Capillary Blood Glucose: Lab Results  Component Value Date   GLUCAP 154 (H) 04/17/2018   GLUCAP 111 (H) 04/10/2018   GLUCAP 181 (H) 04/07/2018   GLUCAP 91 04/05/2018   GLUCAP  108 (H) 04/03/2018     Exercise Target Goals:    Exercise Program Goal: Individual exercise prescription set using results from initial 6 min walk test and THRR while considering  patient's activity barriers and safety.   Exercise Prescription Goal: Initial exercise prescription builds to 30-45 minutes a day of aerobic activity, 2-3 days per week.  Home exercise guidelines will be given to patient during program as part of exercise prescription that the participant will acknowledge.  Activity Barriers & Risk Stratification: Activity Barriers & Cardiac Risk Stratification - 03/07/18 0930      Activity Barriers & Cardiac Risk Stratification   Activity Barriers  Deconditioning;Muscular Weakness;Incisional Pain;Arthritis    Cardiac Risk Stratification  High       6 Minute Walk: 6 Minute Walk    Row Name 03/07/18 1149         6 Minute Walk   Phase  Initial     Distance  1442 feet      Walk Time  6 minutes     # of Rest Breaks  0     MPH  2.7     METS  3.5     RPE  12     VO2 Peak  12.21     Symptoms  No     Resting HR  69 bpm     Resting BP  122/76     Resting Oxygen Saturation   96 %     Exercise Oxygen Saturation  during 6 min walk  97 %     Max Ex. HR  117 bpm     Max Ex. BP  146/82     2 Minute Post BP  118/78        Oxygen Initial Assessment:   Oxygen Re-Evaluation:   Oxygen Discharge (Final Oxygen Re-Evaluation):   Initial Exercise Prescription: Initial Exercise Prescription - 03/07/18 1100      Date of Initial Exercise RX and Referring Provider   Date  03/07/18    Referring Provider  Olena Leatherwood, MD ([facility] coverage)      Treadmill   MPH  2.2    Grade  0    Minutes  10    METs  2.68      Bike   Level  0.5    Minutes  10    METs  2.2      NuStep   Level  3    SPM  80    Minutes  10    METs  2      Prescription Details   Frequency (times per week)  3    Duration  Progress to 30 minutes of continuous aerobic without signs/symptoms of physical distress      Intensity   THRR 40-80% of Max Heartrate  60-121    Ratings of Perceived Exertion  11-13    Perceived Dyspnea  0-4      Progression   Progression  Continue to progress workloads to maintain intensity without signs/symptoms of physical distress.      Resistance Training   Training Prescription  Yes    Weight  2lbs    Reps  10-15       Perform Capillary Blood Glucose checks as needed.  Exercise Prescription Changes: Exercise Prescription Changes    Row Name 03/13/18 1633 03/20/18 1400 04/03/18 1700 04/19/18 1502       Response to Exercise   Blood Pressure (Admit)  122/70  116/70  120/68  118/70  Blood Pressure (Exercise)  150/92  140/90  138/64  132/80    Blood Pressure (Exit)  132/70  112/60  120/70  108/60    Heart Rate (Admit)  80 bpm  91 bpm  86 bpm  82 bpm    Heart Rate (Exercise)  108 bpm  108 bpm  108 bpm  112 bpm    Heart Rate (Exit)  80 bpm  89  bpm  85 bpm  89 bpm    Rating of Perceived Exertion (Exercise)  12  11  11  11     Perceived Dyspnea (Exercise)  0  0  0  0    Symptoms  None  None  None  None    Comments  Pt oriented to exercise equipment   -  -  -    Duration  Progress to 30 minutes of  aerobic without signs/symptoms of physical distress  Continue with 30 min of aerobic exercise without signs/symptoms of physical distress.  Continue with 30 min of aerobic exercise without signs/symptoms of physical distress.  Continue with 30 min of aerobic exercise without signs/symptoms of physical distress.    Intensity  THRR unchanged  THRR unchanged  THRR unchanged  THRR unchanged      Progression   Progression  Continue to progress workloads to maintain intensity without signs/symptoms of physical distress.  Continue to progress workloads to maintain intensity without signs/symptoms of physical distress.  Continue to progress workloads to maintain intensity without signs/symptoms of physical distress.  Continue to progress workloads to maintain intensity without signs/symptoms of physical distress.    Average METs  2.5  2.52  3  3.02      Resistance Training   Training Prescription  Yes  Yes  Yes  No    Weight  2lbs  3lbs  4lbs  -    Reps  10-15  10-15  10-15  -    Time  -  10 Minutes  10 Minutes  -      Interval Training   Interval Training  No  No  No  No      Treadmill   MPH  2.2  2.2  2.3  2.4    Grade  0  0  2  2    Minutes  10  10  10  10     METs  2.69  2.69  3.39  3.5      Bike   Level  0.5  0.8  0.8  0.8    Minutes  10  10  10  10     METs  2.09  2.09  2.79  2.79      NuStep   Level  3  3  3  4     SPM  85  95  95  105    Minutes  10  10  10  10     METs  2.8  2.1  2.2  2.8      Home Exercise Plan   Plans to continue exercise at  -  -  [employer] (comment) [employer], [employer] [facility]comment[facility] [employer], Treadmill/Bike     Frequency  -  -  Add 2 additional days to program exercise  sessions.  Add 2 additional days to program exercise sessions.    Initial Home Exercises Provided  -  -  04/03/18  04/03/18       Exercise Comments: Exercise Comments    Row Name 03/13/18  1636 03/20/18 1404 03/20/18 1406 04/03/18 1347     Exercise Comments  Pt's first day of exericse. Pt oriented to exercise equipment. Will continue to monitor and progress pt as tolerated.   Pt has successfully completed 2 sessions of Cardiac Rehab. Pt is enjoying rehab thus far is able to exercise with no complications. Will continue to monitor and progress pt as tolerated.   Pt has successfully completed 4 sessions of Cardiac Rehab. Pt is enjoying rehab thus far is able to exercise with no complications. Will continue to monitor and progress pt as tolerated.   Reviewed HEP with pt. Pt was very receptive to information. Pt plans to return back to [facility] to complete home exercise.        Exercise Goals and Review: Exercise Goals    Row Name 03/07/18 0930             Exercise Goals   Increase Physical Activity  Yes       Intervention  Provide advice, education, support and counseling about physical activity/exercise needs.;Develop an individualized exercise prescription for aerobic and resistive training based on initial evaluation findings, risk stratification, comorbidities and participant's personal goals.       Expected Outcomes  Short Term: Attend rehab on a regular basis to increase amount of physical activity.;Long Term: Exercising regularly at least 3-5 days a week.;Long Term: Add in home exercise to make exercise part of routine and to increase amount of physical activity.       Increase Strength and Stamina  Yes return to gym-like routine and stay consistent with exercise routine       Intervention  Provide advice, education, support and counseling about physical activity/exercise needs.;Develop an individualized exercise prescription for aerobic and resistive training based on initial evaluation  findings, risk stratification, comorbidities and participant's personal goals.       Expected Outcomes  Short Term: Increase workloads from initial exercise prescription for resistance, speed, and METs.;Short Term: Perform resistance training exercises routinely during rehab and add in resistance training at home;Long Term: Improve cardiorespiratory fitness, muscular endurance and strength as measured by increased METs and functional capacity (6MWT)       Able to understand and use rate of perceived exertion (RPE) scale  Yes       Intervention  Provide education and explanation on how to use RPE scale       Expected Outcomes  Short Term: Able to use RPE daily in rehab to express subjective intensity level;Long Term:  Able to use RPE to guide intensity level when exercising independently       Knowledge and understanding of Target Heart Rate Range (THRR)  Yes       Intervention  Provide education and explanation of THRR including how the numbers were predicted and where they are located for reference       Expected Outcomes  Short Term: Able to state/look up THRR;Long Term: Able to use THRR to govern intensity when exercising independently;Short Term: Able to use daily as guideline for intensity in rehab       Able to check pulse independently  Yes       Intervention  Provide education and demonstration on how to check pulse in carotid and radial arteries.;Review the importance of being able to check your own pulse for safety during independent exercise       Expected Outcomes  Short Term: Able to explain why pulse checking is important during independent exercise;Long Term: Able to check pulse  independently and accurately       Understanding of Exercise Prescription  Yes       Intervention  Provide education, explanation, and written materials on patient's individual exercise prescription       Expected Outcomes  Short Term: Able to explain program exercise prescription;Long Term: Able to explain home  exercise prescription to exercise independently          Exercise Goals Re-Evaluation : Exercise Goals Re-Evaluation    [place] Name 04/03/18 1349             Exercise Goal Re-Evaluation   Exercise Goals Review  Increase Physical Activity;Increase Strength and Stamina;Able to understand and use rate of perceived exertion (RPE) scale;Knowledge and understanding of Target Heart Rate Range (THRR);Able to check pulse independently;Understanding of Exercise Prescription       Comments  Reviewed HEP with pt. Also reviewed RPE Scale, THRR, endpoints of exercise, NTG use, weather precautions, warmup and cool down.        Expected Outcomes  [facility] [employer] using [employer]. Pt will continue to increase cardiorespiratory fitness. Will continue to monitor and progress pt.            Discharge Exercise Prescription (Final Exercise Prescription Changes): Exercise Prescription Changes - 04/19/18 1502      Response to Exercise   Blood Pressure (Admit)  118/70    Blood Pressure (Exercise)  132/80    Blood Pressure (Exit)  108/60    Heart Rate (Admit)  82 bpm    Heart Rate (Exercise)  112 bpm    Heart Rate (Exit)  89 bpm    Rating of Perceived Exertion (Exercise)  11    Perceived Dyspnea (Exercise)  0    Symptoms  None    Duration  Continue with 30 min of aerobic exercise without signs/symptoms of physical distress.    Intensity  THRR unchanged      Progression   Progression  Continue to progress workloads to maintain intensity without signs/symptoms of physical distress.    Average METs  3.02      Resistance Training   Training Prescription  No      Interval Training   Interval Training  No      Treadmill   MPH  2.4    Grade  2    Minutes  10    METs  3.5      Bike   Level  0.8    Minutes  10    METs  2.79      NuStep   Level  4    SPM  105    Minutes  10    METs  2.8      Home Exercise Plan   Plans to continue exercise at  [employer]  (comment) [employer], Treadmill/Bike     Frequency  Add 2 additional days to program exercise sessions.    Initial Home Exercises Provided  04/03/18       Nutrition:  Target Goals: Understanding of nutrition guidelines, daily intake of sodium 1500mg , cholesterol 200mg , calories 30% from fat and 7% or less from saturated fats, daily to have 5 or more servings of fruits and vegetables.  Biometrics: Pre Biometrics - 03/07/18 1150      Pre Biometrics   Height  5\' 9"  (1.753 m)    Weight  190 lb 14.7 oz (86.6 kg)    Waist Circumference  39 inches    Hip Circumference  38.5 inches    Waist to Hip Ratio  1.01 %    BMI (Calculated)  28.18    Triceps Skinfold  10 mm    % Body Fat  25.5 %    Grip Strength  18 kg    Flexibility  0 in    Single Leg Stand  4.22 seconds        Nutrition Therapy Plan and Nutrition Goals: Nutrition Therapy & Goals - 03/15/18 1213      Nutrition Therapy   Diet  Consistent Carb, Heart Healthy      Personal Nutrition Goals   Nutrition Goal  Pt to identify food quantities necessary to achieve weight loss of 5 lb at graduation from cardiac rehab. Wt loss goal wt of 185 lb desired.       Intervention Plan   Intervention  Prescribe, educate and counsel regarding individualized specific dietary modifications aiming towards targeted core components such as weight, hypertension, lipid management, diabetes, heart failure and other comorbidities.    Expected Outcomes  Short Term Goal: Understand basic principles of dietary content, such as calories, fat, sodium, cholesterol and nutrients.;Long Term Goal: Adherence to prescribed nutrition plan.       Nutrition Assessments: Nutrition Assessments - 04/07/18 1344      MEDFICTS Scores   Pre Score  24       Nutrition Goals Re-Evaluation:   Nutrition Goals Re-Evaluation:   Nutrition Goals Discharge (Final Nutrition Goals Re-Evaluation):   Psychosocial: Target Goals: Acknowledge presence or absence of  significant depression and/or stress, maximize coping skills, provide positive support system. Participant is able to verbalize types and ability to use techniques and skills needed for reducing stress and depression.  Initial Review & Psychosocial Screening: Initial Psych Review & Screening - 03/07/18 1211      Initial Review   Current issues with  None Identified      Family Dynamics   Good Support System?  Yes      Barriers   Psychosocial barriers to participate in program  There are no identifiable barriers or psychosocial needs.      Screening Interventions   Interventions  Encouraged to exercise       Quality of Life Scores: Quality of Life - 03/07/18 1151      Quality of Life Scores   Health/Function Pre  30 %    Socioeconomic Pre  30 %    Psych/Spiritual Pre  30 %    Family Pre  30 %    GLOBAL Pre  30 %      Scores of 19 and below usually indicate a poorer quality of life in these areas.  A difference of  2-3 points is a clinically meaningful difference.  A difference of 2-3 points in the total score of the Quality of Life Index has been associated with significant improvement in overall quality of life, self-image, physical symptoms, and general health in studies assessing change in quality of life.  PHQ-9: Recent Review Flowsheet Data    Depression screen [facility] 2/9 03/13/2018   Decreased Interest 0   Down, Depressed, Hopeless 0   PHQ - 2 Score 0     Interpretation of Total Score  Total Score Depression Severity:  1-4 = Minimal depression, 5-9 = Mild depression, 10-14 = Moderate depression, 15-19 = Moderately severe depression, 20-27 = Severe depression   Psychosocial Evaluation and Intervention: Psychosocial Evaluation - 03/13/18 1545      Psychosocial Evaluation & Interventions   Interventions  Encouraged to exercise with the program and follow exercise prescription    Comments  No psychosocial needs identified. No interventions necessary.  Pt has a support  group through the [place] that he has attended before and will attend if needed. Pt looks to his faith and enjoys helping out at his church.     Expected Outcomes  Rayen will exhibit a positive outlook with good coping skills.     Continue Psychosocial Services   No Follow up required       Psychosocial Re-Evaluation: Psychosocial Re-Evaluation    [place] Name 03/23/18 0751 04/19/18 1126           Psychosocial Re-Evaluation   Current issues with  None Identified  None Identified      Comments  No psychosocial needs identified. No intervention necessary.  No psychosocial needs identified. No interventions necessary.      Expected Outcomes  Aryan will continue to exhibit a positive outlook utilizing good coping skills.  Kinnick will continue to exhibit a positive outlook utilizing good coping skills.      Interventions  Encouraged to attend Cardiac Rehabilitation for the exercise  Encouraged to attend Cardiac Rehabilitation for the exercise      Continue Psychosocial Services   No Follow up required  No Follow up required         Psychosocial Discharge (Final Psychosocial Re-Evaluation): Psychosocial Re-Evaluation - 04/19/18 1126      Psychosocial Re-Evaluation   Current issues with  None Identified    Comments  No psychosocial needs identified. No interventions necessary.    Expected Outcomes  Jovannie will continue to exhibit a positive outlook utilizing good coping skills.    Interventions  Encouraged to attend Cardiac Rehabilitation for the exercise    Continue Psychosocial Services   No Follow up required       Vocational Rehabilitation: Provide vocational rehab assistance to qualifying candidates.   Vocational Rehab Evaluation & Intervention: Vocational Rehab - 03/07/18 1209      Initial Vocational Rehab Evaluation & Intervention   Assessment shows need for Vocational Rehabilitation  No Mr Luppino is retired and does not need vocational rehab at this time        Education: Education Goals: Education classes will be provided on a weekly basis, covering required topics. Participant will state understanding/return demonstration of topics presented.  Learning Barriers/Preferences: Learning Barriers/Preferences - 03/07/18 1144      Learning Barriers/Preferences   Learning Barriers  Sight    Learning Preferences  Skilled Demonstration       Education Topics: Count Your Pulse:  -Group instruction provided by verbal instruction, demonstration, patient participation and written materials to support subject.  Instructors address importance of being able to find your pulse and how to count your pulse when at home without a heart monitor.  Patients get hands on experience counting their pulse with staff help and individually.   CARDIAC REHAB PHASE II EXERCISE from 04/19/2018 in [place]  Date  04/07/18  Instruction Review Code  2- Demonstrated Understanding      Heart Attack, Angina, and Risk Factor Modification:  -Group instruction provided by verbal instruction, video, and written materials to support subject.  Instructors address signs and symptoms of angina and heart attacks.    Also discuss risk factors for heart disease and how to make changes to improve heart health risk factors.   CARDIAC REHAB PHASE II EXERCISE from 04/19/2018 in [place]  Date  03/29/18  Instruction Review Code  2- Demonstrated Understanding      Functional Fitness:  -Group instruction provided by verbal instruction, demonstration, patient participation, and written materials to support subject.  Instructors address safety measures for doing things around the house.  Discuss how to get up and down off the floor, how to pick things up properly, how to safely get out of a chair without assistance, and balance training.   Meditation and Mindfulness:  -Group instruction provided by verbal instruction, patient  participation, and written materials to support subject.  Instructor addresses importance of mindfulness and meditation practice to help reduce stress and improve awareness.  Instructor also leads participants through a meditation exercise.    Stretching for Flexibility and Mobility:  -Group instruction provided by verbal instruction, patient participation, and written materials to support subject.  Instructors lead participants through series of stretches that are designed to increase flexibility thus improving mobility.  These stretches are additional exercise for major muscle groups that are typically performed during regular warm up and cool down.   Hands Only CPR:  -Group verbal, video, and participation provides a basic overview of AHA guidelines for community CPR. Role-play of emergencies allow participants the opportunity to practice calling for help and chest compression technique with discussion of AED use.   Hypertension: -Group verbal and written instruction that provides a basic overview of hypertension including the most recent diagnostic guidelines, risk factor reduction with self-care instructions and medication management.   CARDIAC REHAB PHASE II EXERCISE from 04/19/2018 in [place]  Date  03/24/18  Educator  RN  Instruction Review Code  2- Demonstrated Understanding       Nutrition I class: Heart Healthy Eating:  -Group instruction provided by PowerPoint slides, verbal discussion, and written materials to support subject matter. The instructor gives an explanation and review of the Therapeutic Lifestyle Changes diet recommendations, which includes a discussion on lipid goals, dietary fat, sodium, fiber, plant stanol/sterol esters, sugar, and the components of a well-balanced, healthy diet.   Nutrition II class: Lifestyle Skills:  -Group instruction provided by PowerPoint slides, verbal discussion, and written materials to support subject  matter. The instructor gives an explanation and review of label reading, grocery shopping for heart health, heart healthy recipe modifications, and ways to make healthier choices when eating out.   Diabetes Question & Answer:  -Group instruction provided by PowerPoint slides, verbal discussion, and written materials to support subject matter. The instructor gives an explanation and review of diabetes co-morbidities, pre- and post-prandial blood glucose goals, pre-exercise blood glucose goals, signs, symptoms, and treatment of hypoglycemia and hyperglycemia, and foot care basics.   CARDIAC REHAB PHASE II EXERCISE from 04/19/2018 in [place]  Date  04/05/18  Educator  RD  Instruction Review Code  2- Demonstrated Understanding      Diabetes Blitz:  -Group instruction provided by PowerPoint slides, verbal discussion, and written materials to support subject matter. The instructor gives an explanation and review of the physiology behind type 1 and type 2 diabetes, diabetes medications and rational behind using different medications, pre- and post-prandial blood glucose recommendations and Hemoglobin A1c goals, diabetes diet, and exercise including blood glucose guidelines for exercising safely.    Portion Distortion:  -Group instruction provided by PowerPoint slides, verbal discussion, written materials, and food models to support subject matter. The instructor gives an explanation of serving size versus portion size, changes in portions sizes over the last 20  years, and what consists of a serving from each food group.   Stress Management:  -Group instruction provided by verbal instruction, video, and written materials to support subject matter.  Instructors review role of stress in heart disease and how to cope with stress positively.     Exercising on Your Own:  -Group instruction provided by verbal instruction, power point, and written materials to support  subject.  Instructors discuss benefits of exercise, components of exercise, frequency and intensity of exercise, and end points for exercise.  Also discuss use of nitroglycerin and activating EMS.  Review options of places to exercise outside of rehab.  Review guidelines for sex with heart disease.   Cardiac Drugs I:  -Group instruction provided by verbal instruction and written materials to support subject.  Instructor reviews cardiac drug classes: antiplatelets, anticoagulants, beta blockers, and statins.  Instructor discusses reasons, side effects, and lifestyle considerations for each drug class.   CARDIAC REHAB PHASE II EXERCISE from 04/19/2018 in [place]  Date  04/19/18  Instruction Review Code  2- Demonstrated Understanding      Cardiac Drugs II:  -Group instruction provided by verbal instruction and written materials to support subject.  Instructor reviews cardiac drug classes: angiotensin converting enzyme inhibitors (ACE-I), angiotensin II receptor blockers (ARBs), nitrates, and calcium channel blockers.  Instructor discusses reasons, side effects, and lifestyle considerations for each drug class.   CARDIAC REHAB PHASE II EXERCISE from 04/19/2018 in [place]  Date  03/22/18  Instruction Review Code  2- Demonstrated Understanding      Anatomy and Physiology of the Circulatory System:  Group verbal and written instruction and models provide basic cardiac anatomy and physiology, with the coronary electrical and arterial systems. Review of: AMI, Angina, Valve disease, Heart Failure, Peripheral Artery Disease, Cardiac Arrhythmia, Pacemakers, and the ICD.   CARDIAC REHAB PHASE II EXERCISE from 04/19/2018 in [place]  Date  03/15/18  Instruction Review Code  2- Demonstrated Understanding      Other Education:  -Group or individual verbal, written, or video instructions that support the  educational goals of the cardiac rehab program.   Holiday Eating Survival Tips:  -Group instruction provided by PowerPoint slides, verbal discussion, and written materials to support subject matter. The instructor gives patients tips, tricks, and techniques to help them not only survive but enjoy the holidays despite the onslaught of food that accompanies the holidays.   Knowledge Questionnaire Score: Knowledge Questionnaire Score - 03/07/18 1145      Knowledge Questionnaire Score   Pre Score  17/24       Core Components/Risk Factors/Patient Goals at Admission: Personal Goals and Risk Factors at Admission - 03/07/18 1150      Core Components/Risk Factors/Patient Goals on Admission   Diabetes  Yes    Intervention  Provide education about signs/symptoms and action to take for hypo/hyperglycemia.;Provide education about proper nutrition, including hydration, and aerobic/resistive exercise prescription along with prescribed medications to achieve blood glucose in normal ranges: Fasting glucose 65-99 mg/dL    Expected Outcomes  Long Term: Attainment of HbA1C < 7%.;Short Term: Participant verbalizes understanding of the signs/symptoms and immediate care of hyper/hypoglycemia, proper foot care and importance of medication, aerobic/resistive exercise and nutrition plan for blood glucose control.    Hypertension  Yes    Intervention  Provide education on lifestyle modifcations including regular physical activity/exercise, weight management, moderate sodium restriction and increased consumption of fresh fruit, vegetables,  and low fat dairy, alcohol moderation, and smoking cessation.;Monitor prescription use compliance.    Expected Outcomes  Short Term: Continued assessment and intervention until BP is < 140/48mm HG in hypertensive participants. < 130/50mm HG in hypertensive participants with diabetes, heart failure or chronic kidney disease.;Long Term: Maintenance of blood pressure at goal levels.     Lipids  Yes    Intervention  Provide education and support for participant on nutrition & aerobic/resistive exercise along with prescribed medications to achieve LDL 70mg , HDL >40mg .    Expected Outcomes  Short Term: Participant states understanding of desired cholesterol values and is compliant with medications prescribed. Participant is following exercise prescription and nutrition guidelines.;Long Term: Cholesterol controlled with medications as prescribed, with individualized exercise RX and with personalized nutrition plan. Value goals: LDL < 70mg , HDL > 40 mg.       Core Components/Risk Factors/Patient Goals Review:  Goals and Risk Factor Review    Row Name 03/13/18 1542 03/23/18 0750 04/19/18 1123         Core Components/Risk Factors/Patient Goals Review   Personal Goals Review  Lipids;Diabetes;Hypertension  Lipids;Diabetes;Hypertension  Lipids;Diabetes;Hypertension     Review  Pt with multiple CAD RF willing to participate in [place] is looking forward to increasing his strength and stamina.  Pt with multiple CAD RF willing to participate in [place] is looking forward to increasing his strength and stamina.  Pt with multiple CAD RF willing to participate in [place] feels that he is increasing his strength.  He has started walking the distance from the entrance whereas previously he has been wheeled down.      Expected Outcomes  Pt will continue to participate in CR exercise, nutrition, and lifestyle modification opportunities.  Pt will continue to participate in CR exercise, nutrition, and lifestyle modification opportunities.  Pt will continue to participate in CR exercise, nutrition, and lifestyle modification opportunities.        Core Components/Risk Factors/Patient Goals at Discharge (Final Review):  Goals and Risk Factor Review - 04/19/18 1123      Core Components/Risk Factors/Patient Goals Review   Personal Goals Review   Lipids;Diabetes;Hypertension    Review  Pt with multiple CAD RF willing to participate in [place] feels that he is increasing his strength.  He has started walking the distance from the entrance whereas previously he has been wheeled down.     Expected Outcomes  Pt will continue to participate in CR exercise, nutrition, and lifestyle modification opportunities.       ITP Comments: ITP Comments    Row Name 03/07/18 0919 03/23/18 0750 04/19/18 1123       ITP Comments  Dr. Fransico Him, Medical Director  30 Day ITP Review.  Nickalas has started CR and is tolerating exercise well.  30 Day ITP Review.  Myron is tolerating exercise well and feels that he is increasing his strength.  His CBG is normalizing.        Comments: See ITP Comments

## 2018-04-21 ENCOUNTER — Encounter (HOSPITAL_COMMUNITY): Payer: No Typology Code available for payment source

## 2018-04-21 ENCOUNTER — Encounter (HOSPITAL_COMMUNITY)
Admission: RE | Admit: 2018-04-21 | Discharge: 2018-04-21 | Disposition: A | Discharge status: Home / Self Care | Payer: No Typology Code available for payment source | Source: Ambulatory Visit | Attending: Cardiology | Admitting: Cardiology

## 2018-04-21 DIAGNOSIS — I2510 Atherosclerotic heart disease of native coronary artery without angina pectoris: Secondary | ICD-10-CM | POA: Diagnosis not present

## 2018-04-21 DIAGNOSIS — Z951 Presence of aortocoronary bypass graft: Secondary | ICD-10-CM

## 2018-04-24 ENCOUNTER — Encounter (HOSPITAL_COMMUNITY)
Admission: RE | Admit: 2018-04-24 | Discharge: 2018-04-24 | Disposition: A | Discharge status: Home / Self Care | Payer: No Typology Code available for payment source | Source: Ambulatory Visit | Attending: Cardiology | Admitting: Cardiology

## 2018-04-24 ENCOUNTER — Encounter (HOSPITAL_COMMUNITY): Payer: No Typology Code available for payment source

## 2018-04-24 DIAGNOSIS — I2510 Atherosclerotic heart disease of native coronary artery without angina pectoris: Secondary | ICD-10-CM | POA: Diagnosis not present

## 2018-04-24 DIAGNOSIS — Z951 Presence of aortocoronary bypass graft: Secondary | ICD-10-CM

## 2018-04-26 ENCOUNTER — Encounter (HOSPITAL_COMMUNITY)
Admission: RE | Admit: 2018-04-26 | Discharge: 2018-04-26 | Disposition: A | Discharge status: Home / Self Care | Payer: No Typology Code available for payment source | Source: Ambulatory Visit | Attending: Cardiology | Admitting: Cardiology

## 2018-04-26 ENCOUNTER — Encounter (HOSPITAL_COMMUNITY): Payer: No Typology Code available for payment source

## 2018-04-26 DIAGNOSIS — Z951 Presence of aortocoronary bypass graft: Secondary | ICD-10-CM

## 2018-04-26 DIAGNOSIS — I2510 Atherosclerotic heart disease of native coronary artery without angina pectoris: Secondary | ICD-10-CM | POA: Diagnosis not present

## 2018-04-28 ENCOUNTER — Encounter (HOSPITAL_COMMUNITY): Payer: No Typology Code available for payment source

## 2018-05-01 ENCOUNTER — Encounter (HOSPITAL_COMMUNITY)
Admission: RE | Admit: 2018-05-01 | Discharge: 2018-05-01 | Disposition: A | Discharge status: Home / Self Care | Payer: No Typology Code available for payment source | Source: Ambulatory Visit | Attending: Cardiology | Admitting: Cardiology

## 2018-05-01 ENCOUNTER — Encounter (HOSPITAL_COMMUNITY): Payer: No Typology Code available for payment source

## 2018-05-01 DIAGNOSIS — I2510 Atherosclerotic heart disease of native coronary artery without angina pectoris: Secondary | ICD-10-CM | POA: Diagnosis not present

## 2018-05-01 DIAGNOSIS — Z951 Presence of aortocoronary bypass graft: Secondary | ICD-10-CM

## 2018-05-03 ENCOUNTER — Encounter (HOSPITAL_COMMUNITY): Payer: No Typology Code available for payment source

## 2018-05-03 ENCOUNTER — Encounter (HOSPITAL_COMMUNITY)
Admission: RE | Admit: 2018-05-03 | Discharge: 2018-05-03 | Disposition: A | Discharge status: Home / Self Care | Payer: No Typology Code available for payment source | Source: Ambulatory Visit | Attending: Cardiology | Admitting: Cardiology

## 2018-05-03 DIAGNOSIS — I2510 Atherosclerotic heart disease of native coronary artery without angina pectoris: Secondary | ICD-10-CM | POA: Diagnosis not present

## 2018-05-03 DIAGNOSIS — Z951 Presence of aortocoronary bypass graft: Secondary | ICD-10-CM

## 2018-05-03 LAB — GLUCOSE, CAPILLARY: Glucose-Capillary: 158 mg/dL — ABNORMAL HIGH (ref 70–99)

## 2018-05-05 ENCOUNTER — Encounter (HOSPITAL_COMMUNITY): Payer: No Typology Code available for payment source

## 2018-05-05 ENCOUNTER — Encounter (HOSPITAL_COMMUNITY)
Admission: RE | Admit: 2018-05-05 | Discharge: 2018-05-05 | Disposition: A | Discharge status: Home / Self Care | Payer: No Typology Code available for payment source | Source: Ambulatory Visit | Attending: Cardiology | Admitting: Cardiology

## 2018-05-05 DIAGNOSIS — I2510 Atherosclerotic heart disease of native coronary artery without angina pectoris: Secondary | ICD-10-CM | POA: Diagnosis not present

## 2018-05-05 DIAGNOSIS — Z951 Presence of aortocoronary bypass graft: Secondary | ICD-10-CM

## 2018-05-05 LAB — GLUCOSE, CAPILLARY: Glucose-Capillary: 92 mg/dL (ref 70–99)

## 2018-05-08 ENCOUNTER — Encounter (HOSPITAL_COMMUNITY): Payer: No Typology Code available for payment source

## 2018-05-10 ENCOUNTER — Encounter (HOSPITAL_COMMUNITY): Payer: No Typology Code available for payment source

## 2018-05-11 ENCOUNTER — Telehealth (HOSPITAL_COMMUNITY): Payer: Self-pay | Admitting: *Deleted

## 2018-05-11 NOTE — Telephone Encounter (Addendum)
Pt called to inquire about recent absences from cardiac rehab.  Pt reports having hip soreness and back pain not allowing him to get out of the bed.  This started Saturday night.  He reports feeling better now but plans to miss Cardiac Rehab on Friday.  He plans to return on Monday.  Emotional support given to patient.

## 2018-05-12 ENCOUNTER — Encounter (HOSPITAL_COMMUNITY): Payer: No Typology Code available for payment source

## 2018-05-15 ENCOUNTER — Encounter (HOSPITAL_COMMUNITY): Payer: No Typology Code available for payment source

## 2018-05-15 ENCOUNTER — Encounter (HOSPITAL_COMMUNITY)
Admission: RE | Admit: 2018-05-15 | Discharge: 2018-05-15 | Disposition: A | Discharge status: Home / Self Care | Payer: No Typology Code available for payment source | Source: Ambulatory Visit | Attending: Cardiology | Admitting: Cardiology

## 2018-05-15 DIAGNOSIS — E119 Type 2 diabetes mellitus without complications: Secondary | ICD-10-CM | POA: Insufficient documentation

## 2018-05-15 DIAGNOSIS — Z79899 Other long term (current) drug therapy: Secondary | ICD-10-CM | POA: Insufficient documentation

## 2018-05-15 DIAGNOSIS — Z7984 Long term (current) use of oral hypoglycemic drugs: Secondary | ICD-10-CM | POA: Diagnosis not present

## 2018-05-15 DIAGNOSIS — I10 Essential (primary) hypertension: Secondary | ICD-10-CM | POA: Insufficient documentation

## 2018-05-15 DIAGNOSIS — I2510 Atherosclerotic heart disease of native coronary artery without angina pectoris: Secondary | ICD-10-CM | POA: Diagnosis not present

## 2018-05-15 DIAGNOSIS — Z951 Presence of aortocoronary bypass graft: Secondary | ICD-10-CM | POA: Insufficient documentation

## 2018-05-15 DIAGNOSIS — G4730 Sleep apnea, unspecified: Secondary | ICD-10-CM | POA: Diagnosis not present

## 2018-05-15 DIAGNOSIS — F4310 Post-traumatic stress disorder, unspecified: Secondary | ICD-10-CM | POA: Diagnosis not present

## 2018-05-17 ENCOUNTER — Encounter (HOSPITAL_COMMUNITY)
Admission: RE | Admit: 2018-05-17 | Discharge: 2018-05-17 | Disposition: A | Discharge status: Home / Self Care | Payer: No Typology Code available for payment source | Source: Ambulatory Visit | Attending: Cardiology | Admitting: Cardiology

## 2018-05-17 ENCOUNTER — Encounter (HOSPITAL_COMMUNITY): Payer: Self-pay

## 2018-05-17 ENCOUNTER — Encounter (HOSPITAL_COMMUNITY): Payer: No Typology Code available for payment source

## 2018-05-17 DIAGNOSIS — Z951 Presence of aortocoronary bypass graft: Secondary | ICD-10-CM

## 2018-05-17 DIAGNOSIS — I2510 Atherosclerotic heart disease of native coronary artery without angina pectoris: Secondary | ICD-10-CM | POA: Diagnosis not present

## 2018-05-17 LAB — GLUCOSE, CAPILLARY: GLUCOSE-CAPILLARY: 126 mg/dL — AB (ref 70–99)

## 2018-05-18 NOTE — Progress Notes (Signed)
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  allopurinol (ZYLOPRIM) 300 MG tablet, Take 300 mg by mouth daily., Disp: , Rfl:  .  cholecalciferol (VITAMIN D) 1000 UNITS tablet, Take 1,000 Units by mouth daily., Disp: , Rfl:  .  folic acid (FOLVITE) 1 MG tablet, Take 1 mg by mouth daily., Disp: , Rfl:  .  furosemide (LASIX) 20 MG tablet, Take 20 mg by mouth daily., Disp: , Rfl:  .  glimepiride (AMARYL) 1 MG tablet, Take 0.5 mg by mouth 2 (two) times daily. , Disp: , Rfl:  .  hydroxychloroquine (PLAQUENIL) 200 MG tablet, Take 200-400 mg by mouth 2 (two) times daily. Take 2 tabs in the morning and 1 tab in the evening, Disp: , Rfl:  .  lisinopril (PRINIVIL,ZESTRIL) 10 MG tablet, Take 5 mg by mouth 2 (two) times daily. , Disp: , Rfl:  .  metFORMIN (GLUCOPHAGE) 1000 MG tablet, Take 750 mg by mouth 2 (two) times daily with a meal. , Disp: , Rfl:  .  methotrexate (RHEUMATREX) 2.5 MG tablet, Take 12.5 mg by mouth 2 (two) times a week. Caution:Chemotherapy. Protect from light.; takes 5 tablets (12.5mg  total) on Fridays and Saturdays at 5 PM, Disp: , Rfl:  .  Omega-3 Fatty Acids (FISH OIL) 300 MG CAPS, Take 300 mg by mouth daily., Disp: , Rfl:  .  prazosin (MINIPRESS) 2 MG capsule, Take 1 mg by mouth at bedtime. , Disp: , Rfl:  .  predniSONE (DELTASONE) 5 MG tablet, Take 5 mg by mouth daily., Disp: , Rfl:  .  traZODone (DESYREL) 50 MG tablet, Take 50 mg by mouth at bedtime. ,  Disp: , Rfl:   Past Medical History: Past Medical History:  Diagnosis Date  . Arthritis   . Coronary artery disease   . Diabetes mellitus    type 2  . 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 H/O: upper GI bleed 01/2001    related to nonsteroidal use, CLO test +(Med Tx- Prilosec, Amoxicillin, & Biaxin)  . Hypertension   . Insomnia   . Mental disorder    PTSD  . Peptic ulcer disease    history of gastric ulcer  . PTSD (post-traumatic stress disorder)    [facility]  . Sarcoidosis    late 1970s  . Scleritis   . Sleep apnea    cpap with O2 4L for 4 yrs  . Tubular adenoma 2004    Tobacco Use: Social History   Tobacco Use  Smoking Status Never Smoker  Smokeless Tobacco Never Used  Tobacco Comment   occ alcohol    Labs: Recent Review Flowsheet Data    There is no flowsheet data to display.      Capillary Blood Glucose: Lab Results  Component Value Date   GLUCAP 126 (H) 05/17/2018   GLUCAP 92 05/05/2018   GLUCAP 158 (H) 05/03/2018   GLUCAP 154 (H) 04/17/2018   GLUCAP  111 (H) 04/10/2018     Exercise Target Goals:    Exercise Program Goal: Individual exercise prescription set using results from initial 6 min walk test and THRR while considering  patient's activity barriers and safety.   Exercise Prescription Goal: Initial exercise prescription builds to 30-45 minutes a day of aerobic activity, 2-3 days per week.  Home exercise guidelines will be given to patient during program as part of exercise prescription that the participant will acknowledge.  Activity Barriers & Risk Stratification: Activity Barriers & Cardiac Risk Stratification - 03/07/18 0930      Activity Barriers & Cardiac Risk Stratification   Activity Barriers  Deconditioning;Muscular Weakness;Incisional Pain;Arthritis    Cardiac Risk Stratification  High       6 Minute Walk: 6 Minute Walk    Row Name 03/07/18 1149         6 Minute Walk   Phase  Initial     Distance  1442 feet      Walk Time  6 minutes     # of Rest Breaks  0     MPH  2.7     METS  3.5     RPE  12     VO2 Peak  12.21     Symptoms  No     Resting HR  69 bpm     Resting BP  122/76     Resting Oxygen Saturation   96 %     Exercise Oxygen Saturation  during 6 min walk  97 %     Max Ex. HR  117 bpm     Max Ex. BP  146/82     2 Minute Post BP  118/78        Oxygen Initial Assessment:   Oxygen Re-Evaluation:   Oxygen Discharge (Final Oxygen Re-Evaluation):   Initial Exercise Prescription: Initial Exercise Prescription - 03/07/18 1100      Date of Initial Exercise RX and Referring Provider   Date  03/07/18    Referring Provider  Olena Leatherwood, MD ([facility] coverage)      Treadmill   MPH  2.2    Grade  0    Minutes  10    METs  2.68      Bike   Level  0.5    Minutes  10    METs  2.2      NuStep   Level  3    SPM  80    Minutes  10    METs  2      Prescription Details   Frequency (times per week)  3    Duration  Progress to 30 minutes of continuous aerobic without signs/symptoms of physical distress      Intensity   THRR 40-80% of Max Heartrate  60-121    Ratings of Perceived Exertion  11-13    Perceived Dyspnea  0-4      Progression   Progression  Continue to progress workloads to maintain intensity without signs/symptoms of physical distress.      Resistance Training   Training Prescription  Yes    Weight  2lbs    Reps  10-15       Perform Capillary Blood Glucose checks as needed.  Exercise Prescription Changes: Exercise Prescription Changes    Row Name 03/13/18 1633 03/20/18 1400 04/03/18 1700 04/19/18 1502 05/15/18 1439     Response to Exercise   Blood Pressure (Admit)  122/70  116/70  120/68  118/70  105/75   Blood Pressure (Exercise)  150/92  140/90  138/64  132/80  130/70   Blood Pressure (Exit)  132/70  112/60  120/70  108/60  120/80   Heart Rate (Admit)  80 bpm  91 bpm  86 bpm  82 bpm  81 bpm   Heart Rate (Exercise)  108 bpm  108 bpm  108 bpm  112  bpm  113 bpm   Heart Rate (Exit)  80 bpm  89 bpm  85 bpm  89 bpm  70 bpm   Rating of Perceived Exertion (Exercise)  12  11  11  11  11    Perceived Dyspnea (Exercise)  0  0  0  0  0   Symptoms  None  None  None  None  None   Comments  Pt oriented to exercise equipment   -  -  -  -   Duration  Progress to 30 minutes of  aerobic without signs/symptoms of physical distress  Continue with 30 min of aerobic exercise without signs/symptoms of physical distress.  Continue with 30 min of aerobic exercise without signs/symptoms of physical distress.  Continue with 30 min of aerobic exercise without signs/symptoms of physical distress.  Progress to 45 minutes of aerobic exercise without signs/symptoms of physical distress   Intensity  THRR unchanged  THRR unchanged  THRR unchanged  THRR unchanged  THRR unchanged     Progression   Progression  Continue to progress workloads to maintain intensity without signs/symptoms of physical distress.  Continue to progress workloads to maintain intensity without signs/symptoms of physical distress.  Continue to progress workloads to maintain intensity without signs/symptoms of physical distress.  Continue to progress workloads to maintain intensity without signs/symptoms of physical distress.  Continue to progress workloads to maintain intensity without signs/symptoms of physical distress.   Average METs  2.5  2.52  3  3.02  3.5     Resistance Training   Training Prescription  Yes  Yes  Yes  No  No   Weight  2lbs  3lbs  4lbs  -  -   Reps  10-15  10-15  10-15  -  -   Time  -  10 Minutes  10 Minutes  -  -     Interval Training   Interval Training  No  No  No  No  No     Treadmill   MPH  2.2  2.2  2.3  2.4  2.8   Grade  0  0  2  2  2    Minutes  10  10  10  10  15    METs  2.69  2.69  3.39  3.5  3.91     Bike   Level  0.5  0.8  0.8  0.8  1   Minutes  10  10  10  10  15    METs  2.09  2.09  2.79  2.79  3.22     NuStep   Level  3  3  3  4   -   SPM  85  95  95   105  -   Minutes  10  10  10  10   -   METs  2.8  2.1  2.2  2.8  -     Home Exercise Plan   Plans to continue exercise at  -  -  [employer] (comment) [employer], [employer] (comment) [employer], Treadmill/Bike  [employer] (comment) [employer], [employer]readmill/Bike   Frequency  -  -  Add 2 additional days to program exercise sessions.  Add 2 additional days to program exercise sessions.  Add 2 additional days to program exercise sessions.   Initial Home Exercises Provided  -  -  04/03/18  04/03/18  04/03/18      Exercise Comments: Exercise Comments    Row Name 03/13/18 1636 03/20/18 1404 03/20/18 1406 04/03/18 1347 05/18/18 1431   Exercise Comments  Pt's first day of exericse. Pt oriented to exercise equipment. Will continue to monitor and progress pt as tolerated.   Pt has successfully completed 2 sessions of Cardiac Rehab. Pt is enjoying rehab thus far is able to exercise with no complications. Will continue to monitor and progress pt as tolerated.   Pt has successfully completed 4 sessions of Cardiac Rehab. Pt is enjoying rehab thus far is able to exercise with no complications. Will continue to monitor and progress pt as tolerated.   Reviewed HEP with pt. Pt was very receptive to information. Pt plans to return back to [facility] to complete home exercise.   Pt is gradually making progress with exercise. Pt has to be reminded to work at moderate levels, pt gets distracted when talking to peers. Will continue to progress pt's workloads as tolerated.       Exercise Goals and Review: Exercise Goals    Row Name 03/07/18 0930             Exercise Goals   Increase Physical Activity  Yes       Intervention  Provide advice, education, support and counseling about physical activity/exercise needs.;Develop an individualized exercise prescription for aerobic and resistive training based on initial evaluation findings, risk stratification, comorbidities and  participant's personal goals.       Expected Outcomes  Short Term: Attend rehab on a regular basis to increase amount of physical activity.;Long Term: Exercising regularly at least 3-5 days a week.;Long Term: Add in home exercise to make exercise part of routine and to increase amount of physical activity.       Increase Strength and Stamina  Yes return to gym-like routine and stay consistent with exercise routine       Intervention  Provide advice, education, support and counseling about physical activity/exercise needs.;Develop an individualized exercise prescription for aerobic and resistive training based on initial evaluation findings, risk stratification, comorbidities and participant's personal goals.       Expected Outcomes  Short Term: Increase workloads from initial exercise prescription for resistance, speed, and METs.;Short Term: Perform resistance training exercises routinely during rehab and add in resistance training at home;Long Term: Improve cardiorespiratory fitness, muscular endurance and strength as measured by increased METs and functional capacity (6MWT)       Able to understand and use rate of perceived exertion (RPE) scale  Yes       Intervention  Provide education and explanation on how to use RPE scale       Expected Outcomes  Short Term: Able to use RPE daily in rehab to express subjective intensity level;Long Term:  Able to use RPE to guide intensity level when exercising independently       Knowledge and understanding of Target Heart Rate Range (THRR)  Yes       Intervention  Provide education and explanation of THRR including how the numbers were predicted and where they are located for reference       Expected Outcomes  Short Term:  Able to state/look up THRR;Long Term: Able to use THRR to govern intensity when exercising independently;Short Term: Able to use daily as guideline for intensity in rehab       Able to check pulse independently  Yes       Intervention  Provide  education and demonstration on how to check pulse in carotid and radial arteries.;Review the importance of being able to check your own pulse for safety during independent exercise       Expected Outcomes  Short Term: Able to explain why pulse checking is important during independent exercise;Long Term: Able to check pulse independently and accurately       Understanding of Exercise Prescription  Yes       Intervention  Provide education, explanation, and written materials on patient's individual exercise prescription       Expected Outcomes  Short Term: Able to explain program exercise prescription;Long Term: Able to explain home exercise prescription to exercise independently          Exercise Goals Re-Evaluation : Exercise Goals Re-Evaluation    Row Name 04/03/18 1349 05/18/18 1432           Exercise Goal Re-Evaluation   Exercise Goals Review  Increase Physical Activity;Increase Strength and Stamina;Able to understand and use rate of perceived exertion (RPE) scale;Knowledge and understanding of Target Heart Rate Range (THRR);Able to check pulse independently;Understanding of Exercise Prescription  Increase Physical Activity;Understanding of Exercise Prescription      Comments  Reviewed HEP with pt. Also reviewed RPE Scale, THRR, endpoints of exercise, NTG use, weather precautions, warmup and cool down.   Pt is able to exercise 30 minutes with minimal difficulty. Pt has been able to accomplish one of his goals of returning to the [facility] and go on a consistent basis.       Expected Outcomes  [facility] [employer] using [employer]. Pt will continue to increase cardiorespiratory fitness. Will continue to monitor and progress pt.   Pt will continue to exercise 2 days a week at [facility]. Pt will continue to increase stamina. Will continue to monitor and progress pt.           Discharge Exercise Prescription (Final Exercise Prescription Changes): Exercise Prescription Changes -  05/15/18 1439      Response to Exercise   Blood Pressure (Admit)  105/75    Blood Pressure (Exercise)  130/70    Blood Pressure (Exit)  120/80    Heart Rate (Admit)  81 bpm    Heart Rate (Exercise)  113 bpm    Heart Rate (Exit)  70 bpm    Rating of Perceived Exertion (Exercise)  11    Perceived Dyspnea (Exercise)  0    Symptoms  None    Duration  Progress to 45 minutes of aerobic exercise without signs/symptoms of physical distress    Intensity  THRR unchanged      Progression   Progression  Continue to progress workloads to maintain intensity without signs/symptoms of physical distress.    Average METs  3.5      Resistance Training   Training Prescription  No      Interval Training   Interval Training  No      Treadmill   MPH  2.8    Grade  2    Minutes  15    METs  3.91      Bike   Level  1    Minutes  15  METs  3.22      Home Exercise Plan   Plans to continue exercise at  [facility] (comment)   [employer], [employer]/Bike   Frequency  Add 2 additional days to program exercise sessions.    Initial Home Exercises Provided  04/03/18       Nutrition:  Target Goals: Understanding of nutrition guidelines, daily intake of sodium 1500mg , cholesterol 200mg , calories 30% from fat and 7% or less from saturated fats, daily to have 5 or more servings of fruits and vegetables.  Biometrics: Pre Biometrics - 03/07/18 1150      Pre Biometrics   Height  5\' 9"  (1.753 m)    Weight  86.6 kg    Waist Circumference  39 inches    Hip Circumference  38.5 inches    Waist to Hip Ratio  1.01 %    BMI (Calculated)  28.18    Triceps Skinfold  10 mm    % Body Fat  25.5 %    Grip Strength  18 kg    Flexibility  0 in    Single Leg Stand  4.22 seconds        Nutrition Therapy Plan and Nutrition Goals: Nutrition Therapy & Goals - 03/15/18 1213      Nutrition Therapy   Diet  Consistent Carb, Heart Healthy      Personal Nutrition Goals   Nutrition Goal  Pt to  identify food quantities necessary to achieve weight loss of 5 lb at graduation from cardiac rehab. Wt loss goal wt of 185 lb desired.       Intervention Plan   Intervention  Prescribe, educate and counsel regarding individualized specific dietary modifications aiming towards targeted core components such as weight, hypertension, lipid management, diabetes, heart failure and other comorbidities.    Expected Outcomes  Short Term Goal: Understand basic principles of dietary content, such as calories, fat, sodium, cholesterol and nutrients.;Long Term Goal: Adherence to prescribed nutrition plan.       Nutrition Assessments: Nutrition Assessments - 04/07/18 1344      MEDFICTS Scores   Pre Score  24       Nutrition Goals Re-Evaluation:   Nutrition Goals Re-Evaluation:   Nutrition Goals Discharge (Final Nutrition Goals Re-Evaluation):   Psychosocial: Target Goals: Acknowledge presence or absence of significant depression and/or stress, maximize coping skills, provide positive support system. Participant is able to verbalize types and ability to use techniques and skills needed for reducing stress and depression.  Initial Review & Psychosocial Screening: Initial Psych Review & Screening - 03/07/18 1211      Initial Review   Current issues with  None Identified      Family Dynamics   Good Support System?  Yes      Barriers   Psychosocial barriers to participate in program  There are no identifiable barriers or psychosocial needs.      Screening Interventions   Interventions  Encouraged to exercise       Quality of Life Scores: Quality of Life - 03/07/18 1151      Quality of Life Scores   Health/Function Pre  30 %    Socioeconomic Pre  30 %    Psych/Spiritual Pre  30 %    Family Pre  30 %    GLOBAL Pre  30 %      Scores of 19 and below usually indicate a poorer quality of life in these areas.  A difference of  2-3 points is a clinically  meaningful difference.  A  difference of 2-3 points in the total score of the Quality of Life Index has been associated with significant improvement in overall quality of life, self-image, physical symptoms, and general health in studies assessing change in quality of life.  PHQ-9: Recent Review Flowsheet Data    Depression screen [facility] 2/9 03/13/2018   Decreased Interest 0   Down, Depressed, Hopeless 0   PHQ - 2 Score 0     Interpretation of Total Score  Total Score Depression Severity:  1-4 = Minimal depression, 5-9 = Mild depression, 10-14 = Moderate depression, 15-19 = Moderately severe depression, 20-27 = Severe depression   Psychosocial Evaluation and Intervention: Psychosocial Evaluation - 03/13/18 1545      Psychosocial Evaluation & Interventions   Interventions  Encouraged to exercise with the program and follow exercise prescription    Comments  No psychosocial needs identified. No interventions necessary.  Pt has a support group through the [place] that he has attended before and will attend if needed. Pt looks to his faith and enjoys helping out at his church.     Expected Outcomes  Clive will exhibit a positive outlook with good coping skills.     Continue Psychosocial Services   No Follow up required       Psychosocial Re-Evaluation: Psychosocial Re-Evaluation    [place] Name 03/23/18 0751 04/19/18 1126 05/17/18 1557         Psychosocial Re-Evaluation   Current issues with  None Identified  None Identified  None Identified     Comments  No psychosocial needs identified. No intervention necessary.  No psychosocial needs identified. No interventions necessary.  No psychosocial needs identified. No interventions necessary.     Expected Outcomes  Merlin will continue to exhibit a positive outlook utilizing good coping skills.  Carder will continue to exhibit a positive outlook utilizing good coping skills.  Maejor will continue to exhibit a positive outlook utilizing good coping skills.     Interventions   Encouraged to attend Cardiac Rehabilitation for the exercise  Encouraged to attend Cardiac Rehabilitation for the exercise  Encouraged to attend Cardiac Rehabilitation for the exercise     Continue Psychosocial Services   No Follow up required  No Follow up required  No Follow up required        Psychosocial Discharge (Final Psychosocial Re-Evaluation): Psychosocial Re-Evaluation - 05/17/18 1557      Psychosocial Re-Evaluation   Current issues with  None Identified    Comments  No psychosocial needs identified. No interventions necessary.    Expected Outcomes  Hulbert will continue to exhibit a positive outlook utilizing good coping skills.    Interventions  Encouraged to attend Cardiac Rehabilitation for the exercise    Continue Psychosocial Services   No Follow up required       Vocational Rehabilitation: Provide vocational rehab assistance to qualifying candidates.   Vocational Rehab Evaluation & Intervention: Vocational Rehab - 03/07/18 1209      Initial Vocational Rehab Evaluation & Intervention   Assessment shows need for Vocational Rehabilitation  No   Mr Byington is retired and does not need vocational rehab at this time      Education: Education Goals: Education classes will be provided on a weekly basis, covering required topics. Participant will state understanding/return demonstration of topics presented.  Learning Barriers/Preferences: Learning Barriers/Preferences - 03/07/18 1144      Learning Barriers/Preferences   Learning Barriers  Sight    Learning Preferences  Skilled Demonstration  Education Topics: Count Your Pulse:  -Group instruction provided by verbal instruction, demonstration, patient participation and written materials to support subject.  Instructors address importance of being able to find your pulse and how to count your pulse when at home without a heart monitor.  Patients get hands on experience counting their pulse with staff help and  individually.   CARDIAC REHAB PHASE II EXERCISE from 05/03/2018 in [place]  Date  04/07/18  Instruction Review Code  2- Demonstrated Understanding      Heart Attack, Angina, and Risk Factor Modification:  -Group instruction provided by verbal instruction, video, and written materials to support subject.  Instructors address signs and symptoms of angina and heart attacks.    Also discuss risk factors for heart disease and how to make changes to improve heart health risk factors.   CARDIAC REHAB PHASE II EXERCISE from 05/03/2018 in [place]  Date  03/29/18  Instruction Review Code  2- Demonstrated Understanding      Functional Fitness:  -Group instruction provided by verbal instruction, demonstration, patient participation, and written materials to support subject.  Instructors address safety measures for doing things around the house.  Discuss how to get up and down off the floor, how to pick things up properly, how to safely get out of a chair without assistance, and balance training.   CARDIAC REHAB PHASE II EXERCISE from 05/03/2018 in [place]  Date  04/21/18  Instruction Review Code  2- Demonstrated Understanding      Meditation and Mindfulness:  -Group instruction provided by verbal instruction, patient participation, and written materials to support subject.  Instructor addresses importance of mindfulness and meditation practice to help reduce stress and improve awareness.  Instructor also leads participants through a meditation exercise.    CARDIAC REHAB PHASE II EXERCISE from 05/03/2018 in [place]  Date  04/26/18  Instruction Review Code  2- Demonstrated Understanding      Stretching for Flexibility and Mobility:  -Group instruction provided by verbal instruction, patient participation, and written materials to support subject.  Instructors lead  participants through series of stretches that are designed to increase flexibility thus improving mobility.  These stretches are additional exercise for major muscle groups that are typically performed during regular warm up and cool down.   Hands Only CPR:  -Group verbal, video, and participation provides a basic overview of AHA guidelines for community CPR. Role-play of emergencies allow participants the opportunity to practice calling for help and chest compression technique with discussion of AED use.   Hypertension: -Group verbal and written instruction that provides a basic overview of hypertension including the most recent diagnostic guidelines, risk factor reduction with self-care instructions and medication management.   CARDIAC REHAB PHASE II EXERCISE from 05/03/2018 in [place]  Date  03/24/18  Educator  RN  Instruction Review Code  2- Demonstrated Understanding       Nutrition I class: Heart Healthy Eating:  -Group instruction provided by PowerPoint slides, verbal discussion, and written materials to support subject matter. The instructor gives an explanation and review of the Therapeutic Lifestyle Changes diet recommendations, which includes a discussion on lipid goals, dietary fat, sodium, fiber, plant stanol/sterol esters, sugar, and the components of a well-balanced, healthy diet.   Nutrition II class: Lifestyle Skills:  -Group instruction provided by PowerPoint slides, verbal discussion, and written materials to support subject matter. The  instructor gives an explanation and review of label reading, grocery shopping for heart health, heart healthy recipe modifications, and ways to make healthier choices when eating out.   Diabetes Question & Answer:  -Group instruction provided by PowerPoint slides, verbal discussion, and written materials to support subject matter. The instructor gives an explanation and review of diabetes co-morbidities,  pre- and post-prandial blood glucose goals, pre-exercise blood glucose goals, signs, symptoms, and treatment of hypoglycemia and hyperglycemia, and foot care basics.   CARDIAC REHAB PHASE II EXERCISE from 05/03/2018 in [place]  Date  04/05/18  Educator  RD  Instruction Review Code  2- Demonstrated Understanding      Diabetes Blitz:  -Group instruction provided by PowerPoint slides, verbal discussion, and written materials to support subject matter. The instructor gives an explanation and review of the physiology behind type 1 and type 2 diabetes, diabetes medications and rational behind using different medications, pre- and post-prandial blood glucose recommendations and Hemoglobin A1c goals, diabetes diet, and exercise including blood glucose guidelines for exercising safely.    Portion Distortion:  -Group instruction provided by PowerPoint slides, verbal discussion, written materials, and food models to support subject matter. The instructor gives an explanation of serving size versus portion size, changes in portions sizes over the last 20 years, and what consists of a serving from each food group.   Stress Management:  -Group instruction provided by verbal instruction, video, and written materials to support subject matter.  Instructors review role of stress in heart disease and how to cope with stress positively.     CARDIAC REHAB PHASE II EXERCISE from 05/03/2018 in [place]  Date  05/03/18  Educator  RN  Instruction Review Code  2- Demonstrated Understanding      Exercising on Your Own:  -Group instruction provided by verbal instruction, power point, and written materials to support subject.  Instructors discuss benefits of exercise, components of exercise, frequency and intensity of exercise, and end points for exercise.  Also discuss use of nitroglycerin and activating EMS.  Review options of places to exercise  outside of rehab.  Review guidelines for sex with heart disease.   Cardiac Drugs I:  -Group instruction provided by verbal instruction and written materials to support subject.  Instructor reviews cardiac drug classes: antiplatelets, anticoagulants, beta blockers, and statins.  Instructor discusses reasons, side effects, and lifestyle considerations for each drug class.   CARDIAC REHAB PHASE II EXERCISE from 05/03/2018 in [place]  Date  04/19/18  Instruction Review Code  2- Demonstrated Understanding      Cardiac Drugs II:  -Group instruction provided by verbal instruction and written materials to support subject.  Instructor reviews cardiac drug classes: angiotensin converting enzyme inhibitors (ACE-I), angiotensin II receptor blockers (ARBs), nitrates, and calcium channel blockers.  Instructor discusses reasons, side effects, and lifestyle considerations for each drug class.   CARDIAC REHAB PHASE II EXERCISE from 05/03/2018 in [place]  Date  03/22/18  Instruction Review Code  2- Demonstrated Understanding      Anatomy and Physiology of the Circulatory System:  Group verbal and written instruction and models provide basic cardiac anatomy and physiology, with the coronary electrical and arterial systems. Review of: AMI, Angina, Valve disease, Heart Failure, Peripheral Artery Disease, Cardiac Arrhythmia, Pacemakers, and the ICD.   CARDIAC REHAB PHASE II EXERCISE from 05/03/2018 in [place]  Date  03/15/18  Instruction Review Code  2- Demonstrated Understanding      Other Education:  -Group or individual verbal, written, or video instructions that support the educational goals of the cardiac rehab program.   Holiday Eating Survival Tips:  -Group instruction provided by PowerPoint slides, verbal discussion, and written materials to support subject matter. The instructor gives patients  tips, tricks, and techniques to help them not only survive but enjoy the holidays despite the onslaught of food that accompanies the holidays.   Knowledge Questionnaire Score: Knowledge Questionnaire Score - 03/07/18 1145      Knowledge Questionnaire Score   Pre Score  17/24       Core Components/Risk Factors/Patient Goals at Admission: Personal Goals and Risk Factors at Admission - 03/07/18 1150      Core Components/Risk Factors/Patient Goals on Admission   Diabetes  Yes    Intervention  Provide education about signs/symptoms and action to take for hypo/hyperglycemia.;Provide education about proper nutrition, including hydration, and aerobic/resistive exercise prescription along with prescribed medications to achieve blood glucose in normal ranges: Fasting glucose 65-99 mg/dL    Expected Outcomes  Long Term: Attainment of HbA1C < 7%.;Short Term: Participant verbalizes understanding of the signs/symptoms and immediate care of hyper/hypoglycemia, proper foot care and importance of medication, aerobic/resistive exercise and nutrition plan for blood glucose control.    Hypertension  Yes    Intervention  Provide education on lifestyle modifcations including regular physical activity/exercise, weight management, moderate sodium restriction and increased consumption of fresh fruit, vegetables, and low fat dairy, alcohol moderation, and smoking cessation.;Monitor prescription use compliance.    Expected Outcomes  Short Term: Continued assessment and intervention until BP is < 140/19mm HG in hypertensive participants. < 130/40mm HG in hypertensive participants with diabetes, heart failure or chronic kidney disease.;Long Term: Maintenance of blood pressure at goal levels.    Lipids  Yes    Intervention  Provide education and support for participant on nutrition & aerobic/resistive exercise along with prescribed medications to achieve LDL 70mg , HDL >40mg .    Expected Outcomes  Short Term: Participant  states understanding of desired cholesterol values and is compliant with medications prescribed. Participant is following exercise prescription and nutrition guidelines.;Long Term: Cholesterol controlled with medications as prescribed, with individualized exercise RX and with personalized nutrition plan. Value goals: LDL < 70mg , HDL > 40 mg.       Core Components/Risk Factors/Patient Goals Review:  Goals and Risk Factor Review    Row Name 03/13/18 1542 03/23/18 0750 04/19/18 1123 05/17/18 1505       Core Components/Risk Factors/Patient Goals Review   Personal Goals Review  Lipids;Diabetes;Hypertension  Lipids;Diabetes;Hypertension  Lipids;Diabetes;Hypertension  Lipids;Diabetes;Hypertension    Review  Pt with multiple CAD RF willing to participate in [place] is looking forward to increasing his strength and stamina.  Pt with multiple CAD RF willing to participate in [place] is looking forward to increasing his strength and stamina.  Pt with multiple CAD RF willing to participate in [place] feels that he is increasing his strength.  He has started walking the distance from the entrance whereas previously he has been wheeled down.   Pt with multiple CAD RF willing to participate in [place] continues to have increased strength and stamina.  He has continued to walk from the main entrance before exercise.     Expected Outcomes  Pt will continue to participate in [facility] exercise, nutrition, and lifestyle modification opportunities.  Pt will continue to participate in  CR exercise, nutrition, and lifestyle modification opportunities.  Pt will continue to participate in CR exercise, nutrition, and lifestyle modification opportunities.  Pt will continue to participate in CR exercise, nutrition, and lifestyle modification opportunities.       Core Components/Risk Factors/Patient Goals at Discharge (Final Review):  Goals and Risk Factor Review - 05/17/18 1505       Core Components/Risk Factors/Patient Goals Review   Personal Goals Review  Lipids;Diabetes;Hypertension    Review  Pt with multiple CAD RF willing to participate in [place] continues to have increased strength and stamina.  He has continued to walk from the main entrance before exercise.     Expected Outcomes  Pt will continue to participate in CR exercise, nutrition, and lifestyle modification opportunities.       ITP Comments: ITP Comments    Row Name 03/07/18 0919 03/23/18 0750 04/19/18 1123 05/17/18 1504     ITP Comments  Dr. Fransico Him, Medical Director  30 Day ITP Review.  Schylar has started CR and is tolerating exercise well.  30 Day ITP Review.  Kelli is tolerating exercise well and feels that he is increasing his strength.  His CBG is normalizing.  30 Day ITP Review. Tracey is doing well with exercise. He was out briefly for issues with arthritis but this has lessened and he has been able to return to exercise.        Comments: See ITP Comments

## 2018-05-19 ENCOUNTER — Encounter (HOSPITAL_COMMUNITY): Payer: No Typology Code available for payment source

## 2018-05-19 ENCOUNTER — Encounter (HOSPITAL_COMMUNITY)
Admission: RE | Admit: 2018-05-19 | Discharge: 2018-05-19 | Disposition: A | Discharge status: Home / Self Care | Payer: No Typology Code available for payment source | Source: Ambulatory Visit | Attending: Cardiology | Admitting: Cardiology

## 2018-05-19 DIAGNOSIS — I2510 Atherosclerotic heart disease of native coronary artery without angina pectoris: Secondary | ICD-10-CM | POA: Diagnosis not present

## 2018-05-19 DIAGNOSIS — Z951 Presence of aortocoronary bypass graft: Secondary | ICD-10-CM

## 2018-05-22 ENCOUNTER — Encounter (HOSPITAL_COMMUNITY)
Admission: RE | Admit: 2018-05-22 | Discharge: 2018-05-22 | Disposition: A | Discharge status: Home / Self Care | Payer: No Typology Code available for payment source | Source: Ambulatory Visit | Attending: Cardiology | Admitting: Cardiology

## 2018-05-22 ENCOUNTER — Encounter (HOSPITAL_COMMUNITY): Payer: No Typology Code available for payment source

## 2018-05-22 DIAGNOSIS — Z951 Presence of aortocoronary bypass graft: Secondary | ICD-10-CM

## 2018-05-22 DIAGNOSIS — I2510 Atherosclerotic heart disease of native coronary artery without angina pectoris: Secondary | ICD-10-CM | POA: Diagnosis not present

## 2018-05-24 ENCOUNTER — Encounter (HOSPITAL_COMMUNITY)
Admission: RE | Admit: 2018-05-24 | Discharge: 2018-05-24 | Disposition: A | Discharge status: Home / Self Care | Payer: No Typology Code available for payment source | Source: Ambulatory Visit | Attending: Cardiology | Admitting: Cardiology

## 2018-05-24 ENCOUNTER — Encounter (HOSPITAL_COMMUNITY): Payer: No Typology Code available for payment source

## 2018-05-24 DIAGNOSIS — Z951 Presence of aortocoronary bypass graft: Secondary | ICD-10-CM

## 2018-05-24 DIAGNOSIS — I2510 Atherosclerotic heart disease of native coronary artery without angina pectoris: Secondary | ICD-10-CM | POA: Diagnosis not present

## 2018-05-26 ENCOUNTER — Encounter (HOSPITAL_COMMUNITY): Payer: No Typology Code available for payment source

## 2018-05-26 ENCOUNTER — Encounter (HOSPITAL_COMMUNITY)
Admission: RE | Admit: 2018-05-26 | Discharge: 2018-05-26 | Disposition: A | Discharge status: Home / Self Care | Payer: No Typology Code available for payment source | Source: Ambulatory Visit | Attending: Cardiology | Admitting: Cardiology

## 2018-05-26 DIAGNOSIS — I2510 Atherosclerotic heart disease of native coronary artery without angina pectoris: Secondary | ICD-10-CM | POA: Diagnosis not present

## 2018-05-26 DIAGNOSIS — Z951 Presence of aortocoronary bypass graft: Secondary | ICD-10-CM

## 2018-05-29 ENCOUNTER — Encounter (HOSPITAL_COMMUNITY): Payer: No Typology Code available for payment source

## 2018-05-29 ENCOUNTER — Encounter (HOSPITAL_COMMUNITY)
Admission: RE | Admit: 2018-05-29 | Discharge: 2018-05-29 | Disposition: A | Discharge status: Home / Self Care | Payer: No Typology Code available for payment source | Source: Ambulatory Visit | Attending: Cardiology | Admitting: Cardiology

## 2018-05-29 DIAGNOSIS — Z951 Presence of aortocoronary bypass graft: Secondary | ICD-10-CM

## 2018-05-29 DIAGNOSIS — I2510 Atherosclerotic heart disease of native coronary artery without angina pectoris: Secondary | ICD-10-CM | POA: Diagnosis not present

## 2018-05-29 LAB — GLUCOSE, CAPILLARY: Glucose-Capillary: 190 mg/dL — ABNORMAL HIGH (ref 70–99)

## 2018-05-31 ENCOUNTER — Encounter (HOSPITAL_COMMUNITY)
Admission: RE | Admit: 2018-05-31 | Discharge: 2018-05-31 | Disposition: A | Discharge status: Home / Self Care | Payer: No Typology Code available for payment source | Source: Ambulatory Visit | Attending: Cardiology | Admitting: Cardiology

## 2018-05-31 ENCOUNTER — Encounter (HOSPITAL_COMMUNITY): Payer: No Typology Code available for payment source

## 2018-05-31 DIAGNOSIS — Z951 Presence of aortocoronary bypass graft: Secondary | ICD-10-CM

## 2018-05-31 DIAGNOSIS — I2510 Atherosclerotic heart disease of native coronary artery without angina pectoris: Secondary | ICD-10-CM | POA: Diagnosis not present

## 2018-05-31 LAB — GLUCOSE, CAPILLARY: Glucose-Capillary: 175 mg/dL — ABNORMAL HIGH (ref 70–99)

## 2018-06-02 ENCOUNTER — Encounter (HOSPITAL_COMMUNITY)
Admission: RE | Admit: 2018-06-02 | Discharge: 2018-06-02 | Disposition: A | Discharge status: Home / Self Care | Payer: No Typology Code available for payment source | Source: Ambulatory Visit | Attending: Cardiology | Admitting: Cardiology

## 2018-06-02 ENCOUNTER — Encounter (HOSPITAL_COMMUNITY): Payer: No Typology Code available for payment source

## 2018-06-02 DIAGNOSIS — Z951 Presence of aortocoronary bypass graft: Secondary | ICD-10-CM

## 2018-06-02 DIAGNOSIS — I2510 Atherosclerotic heart disease of native coronary artery without angina pectoris: Secondary | ICD-10-CM | POA: Diagnosis not present

## 2018-06-05 ENCOUNTER — Encounter (HOSPITAL_COMMUNITY): Payer: No Typology Code available for payment source

## 2018-06-05 ENCOUNTER — Encounter (HOSPITAL_COMMUNITY)
Admission: RE | Admit: 2018-06-05 | Discharge: 2018-06-05 | Disposition: A | Discharge status: Home / Self Care | Payer: No Typology Code available for payment source | Source: Ambulatory Visit | Attending: Cardiology | Admitting: Cardiology

## 2018-06-05 DIAGNOSIS — Z951 Presence of aortocoronary bypass graft: Secondary | ICD-10-CM

## 2018-06-07 ENCOUNTER — Encounter (HOSPITAL_COMMUNITY): Payer: No Typology Code available for payment source

## 2018-06-07 ENCOUNTER — Encounter (HOSPITAL_COMMUNITY)
Admission: RE | Admit: 2018-06-07 | Discharge: 2018-06-07 | Disposition: A | Discharge status: Home / Self Care | Payer: No Typology Code available for payment source | Source: Ambulatory Visit | Attending: Cardiology | Admitting: Cardiology

## 2018-06-07 DIAGNOSIS — Z951 Presence of aortocoronary bypass graft: Secondary | ICD-10-CM

## 2018-06-07 DIAGNOSIS — I2510 Atherosclerotic heart disease of native coronary artery without angina pectoris: Secondary | ICD-10-CM | POA: Diagnosis not present

## 2018-06-09 ENCOUNTER — Encounter (HOSPITAL_COMMUNITY): Payer: No Typology Code available for payment source

## 2018-06-14 ENCOUNTER — Encounter (HOSPITAL_COMMUNITY): Payer: No Typology Code available for payment source

## 2018-06-15 ENCOUNTER — Encounter (HOSPITAL_COMMUNITY): Payer: Self-pay

## 2018-06-15 NOTE — Progress Notes (Signed)
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  allopurinol (ZYLOPRIM) 300 MG tablet, Take 300 mg by mouth daily., Disp: , Rfl:  .  cholecalciferol (VITAMIN D) 1000 UNITS tablet, Take 1,000 Units by mouth daily., Disp: , Rfl:  .  folic acid (FOLVITE) 1 MG tablet, Take 1 mg by mouth daily., Disp: , Rfl:  .  furosemide (LASIX) 20 MG tablet, Take 20 mg by mouth daily., Disp: , Rfl:  .  glimepiride (AMARYL) 1 MG tablet, Take 0.5 mg by mouth 2 (two) times daily. , Disp: , Rfl:  .  hydroxychloroquine (PLAQUENIL) 200 MG tablet, Take 200-400 mg by mouth 2 (two) times daily. Take 2 tabs in the morning and 1 tab in the evening, Disp: , Rfl:  .  lisinopril (PRINIVIL,ZESTRIL) 10 MG tablet, Take 5 mg by mouth 2 (two) times daily. , Disp: , Rfl:  .  metFORMIN (GLUCOPHAGE) 1000 MG tablet, Take 750 mg by mouth 2 (two) times daily with a meal. , Disp: , Rfl:  .  methotrexate (RHEUMATREX) 2.5 MG tablet, Take 12.5 mg by mouth 2 (two) times a week. Caution:Chemotherapy. Protect from light.; takes 5 tablets (12.5mg  total) on Fridays and Saturdays at 5 PM, Disp: , Rfl:  .  Omega-3 Fatty Acids (FISH OIL) 300 MG CAPS, Take 300 mg by mouth daily., Disp: , Rfl:  .  prazosin (MINIPRESS) 2 MG capsule, Take 1 mg by mouth at bedtime. , Disp: , Rfl:  .  predniSONE (DELTASONE) 5 MG tablet, Take 5 mg by mouth daily., Disp: , Rfl:  .  traZODone (DESYREL) 50 MG tablet, Take 50 mg by mouth at bedtime. ,  Disp: , Rfl:   Past Medical History: Past Medical History:  Diagnosis Date  . Arthritis   . Coronary artery disease   . Diabetes mellitus    type 2  . 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 H/O: upper GI bleed 01/2001    related to nonsteroidal use, CLO test +(Med Tx- Prilosec, Amoxicillin, & Biaxin)  . Hypertension   . Insomnia   . Mental disorder    PTSD  . Peptic ulcer disease    history of gastric ulcer  . PTSD (post-traumatic stress disorder)    [facility]  . Sarcoidosis    late 1970s  . Scleritis   . Sleep apnea    cpap with O2 4L for 4 yrs  . Tubular adenoma 2004    Tobacco Use: Social History   Tobacco Use  Smoking Status Never Smoker  Smokeless Tobacco Never Used  Tobacco Comment   occ alcohol    Labs: Recent Review Flowsheet Data    There is no flowsheet data to display.      Capillary Blood Glucose: Lab Results  Component Value Date   GLUCAP 175 (H) 05/31/2018   GLUCAP 190 (H) 05/29/2018   GLUCAP 126 (H) 05/17/2018   GLUCAP 92 05/05/2018   GLUCAP  158 (H) 05/03/2018     Exercise Target Goals: Exercise Program Goal: Individual exercise prescription set using results from initial 6 min walk test and THRR while considering  patient's activity barriers and safety.   Exercise Prescription Goal: Initial exercise prescription builds to 30-45 minutes a day of aerobic activity, 2-3 days per week.  Home exercise guidelines will be given to patient during program as part of exercise prescription that the participant will acknowledge.  Activity Barriers & Risk Stratification: Activity Barriers & Cardiac Risk Stratification - 03/07/18 0930      Activity Barriers & Cardiac Risk Stratification   Activity Barriers  Deconditioning;Muscular Weakness;Incisional Pain;Arthritis    Cardiac Risk Stratification  High       6 Minute Walk: 6 Minute Walk    Row Name 03/07/18 1149         6 Minute Walk   Phase  Initial     Distance  1442 feet     Walk  Time  6 minutes     # of Rest Breaks  0     MPH  2.7     METS  3.5     RPE  12     VO2 Peak  12.21     Symptoms  No     Resting HR  69 bpm     Resting BP  122/76     Resting Oxygen Saturation   96 %     Exercise Oxygen Saturation  during 6 min walk  97 %     Max Ex. HR  117 bpm     Max Ex. BP  146/82     2 Minute Post BP  118/78        Oxygen Initial Assessment:   Oxygen Re-Evaluation:   Oxygen Discharge (Final Oxygen Re-Evaluation):   Initial Exercise Prescription: Initial Exercise Prescription - 03/07/18 1100      Date of Initial Exercise RX and Referring Provider   Date  03/07/18    Referring Provider  Olena Leatherwood, MD ([facility] coverage)      Treadmill   MPH  2.2    Grade  0    Minutes  10    METs  2.68      Bike   Level  0.5    Minutes  10    METs  2.2      NuStep   Level  3    SPM  80    Minutes  10    METs  2      Prescription Details   Frequency (times per week)  3    Duration  Progress to 30 minutes of continuous aerobic without signs/symptoms of physical distress      Intensity   THRR 40-80% of Max Heartrate  60-121    Ratings of Perceived Exertion  11-13    Perceived Dyspnea  0-4      Progression   Progression  Continue to progress workloads to maintain intensity without signs/symptoms of physical distress.      Resistance Training   Training Prescription  Yes    Weight  2lbs    Reps  10-15       Perform Capillary Blood Glucose checks as needed.  Exercise Prescription Changes: Exercise Prescription Changes    Row Name 03/13/18 1633 03/20/18 1400 04/03/18 1700 04/19/18 1502 05/15/18 1439     Response to Exercise   Blood Pressure (Admit)  122/70  116/70  120/68  118/70  105/75  Blood Pressure (Exercise)  150/92  140/90  138/64  132/80  130/70   Blood Pressure (Exit)  132/70  112/60  120/70  108/60  120/80   Heart Rate (Admit)  80 bpm  91 bpm  86 bpm  82 bpm  81 bpm   Heart Rate (Exercise)  108 bpm  108 bpm  108 bpm  112 bpm   113 bpm   Heart Rate (Exit)  80 bpm  89 bpm  85 bpm  89 bpm  70 bpm   Rating of Perceived Exertion (Exercise)  12  11  11  11  11    Perceived Dyspnea (Exercise)  0  0  0  0  0   Symptoms  None  None  None  None  None   Comments  Pt oriented to exercise equipment   -  -  -  -   Duration  Progress to 30 minutes of  aerobic without signs/symptoms of physical distress  Continue with 30 min of aerobic exercise without signs/symptoms of physical distress.  Continue with 30 min of aerobic exercise without signs/symptoms of physical distress.  Continue with 30 min of aerobic exercise without signs/symptoms of physical distress.  Progress to 45 minutes of aerobic exercise without signs/symptoms of physical distress   Intensity  THRR unchanged  THRR unchanged  THRR unchanged  THRR unchanged  THRR unchanged     Progression   Progression  Continue to progress workloads to maintain intensity without signs/symptoms of physical distress.  Continue to progress workloads to maintain intensity without signs/symptoms of physical distress.  Continue to progress workloads to maintain intensity without signs/symptoms of physical distress.  Continue to progress workloads to maintain intensity without signs/symptoms of physical distress.  Continue to progress workloads to maintain intensity without signs/symptoms of physical distress.   Average METs  2.5  2.52  3  3.02  3.5     Resistance Training   Training Prescription  Yes  Yes  Yes  No  No   Weight  2lbs  3lbs  4lbs  -  -   Reps  10-15  10-15  10-15  -  -   Time  -  10 Minutes  10 Minutes  -  -     Interval Training   Interval Training  No  No  No  No  No     Treadmill   MPH  2.2  2.2  2.3  2.4  2.8   Grade  0  0  2  2  2    Minutes  10  10  10  10  15    METs  2.69  2.69  3.39  3.5  3.91     Bike   Level  0.5  0.8  0.8  0.8  1   Minutes  10  10  10  10  15    METs  2.09  2.09  2.79  2.79  3.22     NuStep   Level  3  3  3  4   -   SPM  85  95  95  105  -    Minutes  10  10  10  10   -   METs  2.8  2.1  2.2  2.8  -     Home Exercise Plan   Plans to continue exercise at  -  -  [employer] (comment) [employer], [employer] (comment) Biomedical [employer], Treadmill/Bike   [employer]  [facility] (comment) [employer], [facility]readmill/Bike   Frequency  -  -  Add 2 additional days to program exercise sessions.  Add 2 additional days to program exercise sessions.  Add 2 additional days to program exercise sessions.   Initial Home Exercises Provided  -  -  04/03/18  04/03/18  04/03/18   Row Name 05/31/18 1613 06/07/18 1347           Response to Exercise   Blood Pressure (Admit)  124/68  138/80      Blood Pressure (Exercise)  142/82  128/80      Blood Pressure (Exit)  128/60  136/84      Heart Rate (Admit)  98 bpm  78 bpm      Heart Rate (Exercise)  120 bpm  115 bpm      Heart Rate (Exit)  102 bpm  79 bpm      Rating of Perceived Exertion (Exercise)  12  11      Perceived Dyspnea (Exercise)  0  0      Symptoms  None   None       Comments  None   None       Duration  Continue with 45 min of aerobic exercise without signs/symptoms of physical distress.  Continue with 45 min of aerobic exercise without signs/symptoms of physical distress.      Intensity  THRR unchanged  THRR unchanged        Progression   Progression  Continue to progress workloads to maintain intensity without signs/symptoms of physical distress.  Continue to progress workloads to maintain intensity without signs/symptoms of physical distress.      Average METs  3.26  3.6        Resistance Training   Training Prescription  No  Yes      Weight  -  4lbs      Reps  -  10-15      Time  -  10 Minutes        Interval Training   Interval Training  No  No        Treadmill   MPH  2.8  2.8      Grade  2  2      Minutes  10  15      METs  3.91  3.91        Bike   Level  1  1      Minutes  10  15      METs  3.28  3.28        NuStep   Level  5  -      SPM  105  -       Minutes  10  -      METs  2.6  -        Home Exercise Plan   Plans to continue exercise at  [employer] (comment) [employer], Treadmill/Bike   [employer] (comment) [employer], Treadmill/Bike       Frequency  Add 2 additional days to program exercise sessions.  Add 2 additional days to program exercise sessions.      Initial Home Exercises Provided  04/03/18  04/03/18         Exercise Comments: Exercise Comments    Row Name 03/13/18 1636 03/20/18 1404 03/20/18 1406 04/03/18 1347 05/18/18 1431   Exercise Comments  Pt's first day of exericse. Pt oriented to exercise equipment. Will continue to monitor  and progress pt as tolerated.   Pt has successfully completed 2 sessions of Cardiac Rehab. Pt is enjoying rehab thus far is able to exercise with no complications. Will continue to monitor and progress pt as tolerated.   Pt has successfully completed 4 sessions of Cardiac Rehab. Pt is enjoying rehab thus far is able to exercise with no complications. Will continue to monitor and progress pt as tolerated.   Reviewed HEP with pt. Pt was very receptive to information. Pt plans to return back to [facility] to complete home exercise.   Pt is gradually making progress with exercise. Pt has to be reminded to work at moderate levels, pt gets distracted when talking to peers. Will continue to progress pt's workloads as tolerated.    [place] Name 06/08/18 [phone]           Exercise Comments  Reviewed METs and Goals with pt. Pt enjoys rehab and being able to connect with other pts. Pt has about 7 more sessions left in rehab. Will continue to work with pt to progress and increase strength.           Exercise Goals and Review: Exercise Goals    Row Name 03/07/18 0930             Exercise Goals   Increase Physical Activity  Yes       Intervention  Provide advice, education, support and counseling about physical activity/exercise needs.;Develop an individualized exercise prescription for aerobic  and resistive training based on initial evaluation findings, risk stratification, comorbidities and participant's personal goals.       Expected Outcomes  Short Term: Attend rehab on a regular basis to increase amount of physical activity.;Long Term: Exercising regularly at least 3-5 days a week.;Long Term: Add in home exercise to make exercise part of routine and to increase amount of physical activity.       Increase Strength and Stamina  Yes return to gym-like routine and stay consistent with exercise routine       Intervention  Provide advice, education, support and counseling about physical activity/exercise needs.;Develop an individualized exercise prescription for aerobic and resistive training based on initial evaluation findings, risk stratification, comorbidities and participant's personal goals.       Expected Outcomes  Short Term: Increase workloads from initial exercise prescription for resistance, speed, and METs.;Short Term: Perform resistance training exercises routinely during rehab and add in resistance training at home;Long Term: Improve cardiorespiratory fitness, muscular endurance and strength as measured by increased METs and functional capacity (6MWT)       Able to understand and use rate of perceived exertion (RPE) scale  Yes       Intervention  Provide education and explanation on how to use RPE scale       Expected Outcomes  Short Term: Able to use RPE daily in rehab to express subjective intensity level;Long Term:  Able to use RPE to guide intensity level when exercising independently       Knowledge and understanding of Target Heart Rate Range (THRR)  Yes       Intervention  Provide education and explanation of THRR including how the numbers were predicted and where they are located for reference       Expected Outcomes  Short Term: Able to state/look up THRR;Long Term: Able to use THRR to govern intensity when exercising independently;Short Term: Able to use daily as guideline  for intensity in rehab       Able to check pulse  independently  Yes       Intervention  Provide education and demonstration on how to check pulse in carotid and radial arteries.;Review the importance of being able to check your own pulse for safety during independent exercise       Expected Outcomes  Short Term: Able to explain why pulse checking is important during independent exercise;Long Term: Able to check pulse independently and accurately       Understanding of Exercise Prescription  Yes       Intervention  Provide education, explanation, and written materials on patient's individual exercise prescription       Expected Outcomes  Short Term: Able to explain program exercise prescription;Long Term: Able to explain home exercise prescription to exercise independently          Exercise Goals Re-Evaluation : Exercise Goals Re-Evaluation    Row Name 04/03/18 1349 05/18/18 1432 06/08/18 0659         Exercise Goal Re-Evaluation   Exercise Goals Review  Increase Physical Activity;Increase Strength and Stamina;Able to understand and use rate of perceived exertion (RPE) scale;Knowledge and understanding of Target Heart Rate Range (THRR);Able to check pulse independently;Understanding of Exercise Prescription  Increase Physical Activity;Understanding of Exercise Prescription  Increase Physical Activity;Understanding of Exercise Prescription     Comments  Reviewed HEP with pt. Also reviewed RPE Scale, THRR, endpoints of exercise, NTG use, weather precautions, warmup and cool down.   Pt is able to exercise 30 minutes with minimal difficulty. Pt has been able to accomplish one of his goals of [facility] and go on a consistent basis.   Pt has had some difficulty with arthritis in his hands, but stills put forth effort to exercise in rehab. Will continue to monitor and progres.      Expected Outcomes  [facility] [employer] [facility] [employer]. Pt will continue to increase  cardiorespiratory fitness. Will continue to monitor and progress pt.   Pt will continue to exercise 2 days a week at [facility]. Pt will continue to increase stamina. Will continue to monitor and progress pt.   Pt will continue to exercise 2-3 days a week at [facility]. Pt is now going weekly. Will follow up with pt regarding plans for exericse post rehab.         Discharge Exercise Prescription (Final Exercise Prescription Changes): Exercise Prescription Changes - 06/07/18 1347      Response to Exercise   Blood Pressure (Admit)  138/80    Blood Pressure (Exercise)  128/80    Blood Pressure (Exit)  136/84    Heart Rate (Admit)  78 bpm    Heart Rate (Exercise)  115 bpm    Heart Rate (Exit)  79 bpm    Rating of Perceived Exertion (Exercise)  11    Perceived Dyspnea (Exercise)  0    Symptoms  None     Comments  None     Duration  Continue with 45 min of aerobic exercise without signs/symptoms of physical distress.    Intensity  THRR unchanged      Progression   Progression  Continue to progress workloads to maintain intensity without signs/symptoms of physical distress.    Average METs  3.6      Resistance Training   Training Prescription  Yes    Weight  4lbs    Reps  10-15    Time  10 Minutes      Interval Training   Interval Training  No  Treadmill   MPH  2.8    Grade  2    Minutes  15    METs  3.91      Bike   Level  1    Minutes  15    METs  3.28      Home Exercise Plan   Plans to continue exercise at  [facility] (comment)   [employer], [facility]Bike    Frequency  Add 2 additional days to program exercise sessions.    Initial Home Exercises Provided  04/03/18       Nutrition:  Target Goals: Understanding of nutrition guidelines, daily intake of sodium 1500mg , cholesterol 200mg , calories 30% from fat and 7% or less from saturated fats, daily to have 5 or more servings of fruits and vegetables.  Biometrics: Pre Biometrics - 03/07/18 1150      Pre  Biometrics   Height  5\' 9"  (1.753 m)    Weight  86.6 kg    Waist Circumference  39 inches    Hip Circumference  38.5 inches    Waist to Hip Ratio  1.01 %    BMI (Calculated)  28.18    Triceps Skinfold  10 mm    % Body Fat  25.5 %    Grip Strength  18 kg    Flexibility  0 in    Single Leg Stand  4.22 seconds        Nutrition Therapy Plan and Nutrition Goals: Nutrition Therapy & Goals - 03/15/18 1213      Nutrition Therapy   Diet  Consistent Carb, Heart Healthy      Personal Nutrition Goals   Nutrition Goal  Pt to identify food quantities necessary to achieve weight loss of 5 lb at graduation from cardiac rehab. Wt loss goal wt of 185 lb desired.       Intervention Plan   Intervention  Prescribe, educate and counsel regarding individualized specific dietary modifications aiming towards targeted core components such as weight, hypertension, lipid management, diabetes, heart failure and other comorbidities.    Expected Outcomes  Short Term Goal: Understand basic principles of dietary content, such as calories, fat, sodium, cholesterol and nutrients.;Long Term Goal: Adherence to prescribed nutrition plan.       Nutrition Assessments: Nutrition Assessments - 04/07/18 1344      MEDFICTS Scores   Pre Score  24       Nutrition Goals Re-Evaluation:   Nutrition Goals Re-Evaluation:   Nutrition Goals Discharge (Final Nutrition Goals Re-Evaluation):   Psychosocial: Target Goals: Acknowledge presence or absence of significant depression and/or stress, maximize coping skills, provide positive support system. Participant is able to verbalize types and ability to use techniques and skills needed for reducing stress and depression.  Initial Review & Psychosocial Screening: Initial Psych Review & Screening - 03/07/18 1211      Initial Review   Current issues with  None Identified      Family Dynamics   Good Support System?  Yes      Barriers   Psychosocial barriers to  participate in program  There are no identifiable barriers or psychosocial needs.      Screening Interventions   Interventions  Encouraged to exercise       Quality of Life Scores: Quality of Life - 03/07/18 1151      Quality of Life Scores   Health/Function Pre  30 %    Socioeconomic Pre  30 %    Psych/Spiritual Pre  30 %  Family Pre  30 %    GLOBAL Pre  30 %      Scores of 19 and below usually indicate a poorer quality of life in these areas.  A difference of  2-3 points is a clinically meaningful difference.  A difference of 2-3 points in the total score of the Quality of Life Index has been associated with significant improvement in overall quality of life, self-image, physical symptoms, and general health in studies assessing change in quality of life.  PHQ-9: Recent Review Flowsheet Data    Depression screen [facility] 2/9 03/13/2018   Decreased Interest 0   Down, Depressed, Hopeless 0   PHQ - 2 Score 0     Interpretation of Total Score  Total Score Depression Severity:  1-4 = Minimal depression, 5-9 = Mild depression, 10-14 = Moderate depression, 15-19 = Moderately severe depression, 20-27 = Severe depression   Psychosocial Evaluation and Intervention: Psychosocial Evaluation - 03/13/18 1545      Psychosocial Evaluation & Interventions   Interventions  Encouraged to exercise with the program and follow exercise prescription    Comments  No psychosocial needs identified. No interventions necessary.  Pt has a support group through the [place] that he has attended before and will attend if needed. Pt looks to his faith and enjoys helping out at his church.     Expected Outcomes  Khaleed will exhibit a positive outlook with good coping skills.     Continue Psychosocial Services   No Follow up required       Psychosocial Re-Evaluation: Psychosocial Re-Evaluation    [place] Name 03/23/18 0751 04/19/18 1126 05/17/18 1557 06/15/18 0946       Psychosocial Re-Evaluation   Current issues  with  None Identified  None Identified  None Identified  None Identified    Comments  No psychosocial needs identified. No intervention necessary.  No psychosocial needs identified. No interventions necessary.  No psychosocial needs identified. No interventions necessary.  No psychosocial needs identified. No interventions necessary.    Expected Outcomes  Shamarion will continue to exhibit a positive outlook utilizing good coping skills.  Kahleel will continue to exhibit a positive outlook utilizing good coping skills.  Genevieve will continue to exhibit a positive outlook utilizing good coping skills.  Mckenna will continue to exhibit a positive outlook utilizing good coping skills.    Interventions  Encouraged to attend Cardiac Rehabilitation for the exercise  Encouraged to attend Cardiac Rehabilitation for the exercise  Encouraged to attend Cardiac Rehabilitation for the exercise  Encouraged to attend Cardiac Rehabilitation for the exercise    Continue Psychosocial Services   No Follow up required  No Follow up required  No Follow up required  No Follow up required       Psychosocial Discharge (Final Psychosocial Re-Evaluation): Psychosocial Re-Evaluation - 06/15/18 0946      Psychosocial Re-Evaluation   Current issues with  None Identified    Comments  No psychosocial needs identified. No interventions necessary.    Expected Outcomes  Balthazar will continue to exhibit a positive outlook utilizing good coping skills.    Interventions  Encouraged to attend Cardiac Rehabilitation for the exercise    Continue Psychosocial Services   No Follow up required       Vocational Rehabilitation: Provide vocational rehab assistance to qualifying candidates.   Vocational Rehab Evaluation & Intervention: Vocational Rehab - 03/07/18 1209      Initial Vocational Rehab Evaluation & Intervention   Assessment shows need for Vocational  Rehabilitation  No   Mr Griffie is retired and does not need vocational  rehab at this time      Education: Education Goals: Education classes will be provided on a weekly basis, covering required topics. Participant will state understanding/return demonstration of topics presented.  Learning Barriers/Preferences: Learning Barriers/Preferences - 03/07/18 1144      Learning Barriers/Preferences   Learning Barriers  Sight    Learning Preferences  Skilled Demonstration       Education Topics: Count Your Pulse:  -Group instruction provided by verbal instruction, demonstration, patient participation and written materials to support subject.  Instructors address importance of being able to find your pulse and how to count your pulse when at home without a heart monitor.  Patients get hands on experience counting their pulse with staff help and individually.   CARDIAC REHAB PHASE II EXERCISE from 06/02/2018 in [place]  Date  04/07/18  Instruction Review Code  2- Demonstrated Understanding      Heart Attack, Angina, and Risk Factor Modification:  -Group instruction provided by verbal instruction, video, and written materials to support subject.  Instructors address signs and symptoms of angina and heart attacks.    Also discuss risk factors for heart disease and how to make changes to improve heart health risk factors.   CARDIAC REHAB PHASE II EXERCISE from 06/02/2018 in [place]  Date  03/29/18  Instruction Review Code  2- Demonstrated Understanding      Functional Fitness:  -Group instruction provided by verbal instruction, demonstration, patient participation, and written materials to support subject.  Instructors address safety measures for doing things around the house.  Discuss how to get up and down off the floor, how to pick things up properly, how to safely get out of a chair without assistance, and balance training.   CARDIAC REHAB PHASE II EXERCISE from 06/02/2018 in [place]  Date  05/26/18  Instruction Review Code  2- Demonstrated Understanding      Meditation and Mindfulness:  -Group instruction provided by verbal instruction, patient participation, and written materials to support subject.  Instructor addresses importance of mindfulness and meditation practice to help reduce stress and improve awareness.  Instructor also leads participants through a meditation exercise.    CARDIAC REHAB PHASE II EXERCISE from 06/02/2018 in [place]  Date  04/26/18  Instruction Review Code  2- Demonstrated Understanding      Stretching for Flexibility and Mobility:  -Group instruction provided by verbal instruction, patient participation, and written materials to support subject.  Instructors lead participants through series of stretches that are designed to increase flexibility thus improving mobility.  These stretches are additional exercise for major muscle groups that are typically performed during regular warm up and cool down.   Hands Only CPR:  -Group verbal, video, and participation provides a basic overview of AHA guidelines for community CPR. Role-play of emergencies allow participants the opportunity to practice calling for help and chest compression technique with discussion of AED use.   Hypertension: -Group verbal and written instruction that provides a basic overview of hypertension including the most recent diagnostic guidelines, risk factor reduction with self-care instructions and medication management.   CARDIAC REHAB PHASE II EXERCISE from 06/02/2018 in [place]  Date  06/02/18  Educator  RN  Instruction Review Code  2- Demonstrated Understanding       Nutrition I  class: Heart Healthy Eating:  -Group instruction provided by PowerPoint slides, verbal discussion, and written materials to support subject matter. The instructor gives an explanation and  review of the Therapeutic Lifestyle Changes diet recommendations, which includes a discussion on lipid goals, dietary fat, sodium, fiber, plant stanol/sterol esters, sugar, and the components of a well-balanced, healthy diet.   Nutrition II class: Lifestyle Skills:  -Group instruction provided by PowerPoint slides, verbal discussion, and written materials to support subject matter. The instructor gives an explanation and review of label reading, grocery shopping for heart health, heart healthy recipe modifications, and ways to make healthier choices when eating out.   Diabetes Question & Answer:  -Group instruction provided by PowerPoint slides, verbal discussion, and written materials to support subject matter. The instructor gives an explanation and review of diabetes co-morbidities, pre- and post-prandial blood glucose goals, pre-exercise blood glucose goals, signs, symptoms, and treatment of hypoglycemia and hyperglycemia, and foot care basics.   CARDIAC REHAB PHASE II EXERCISE from 06/02/2018 in [place]  Date  04/05/18  Educator  RD  Instruction Review Code  2- Demonstrated Understanding      Diabetes Blitz:  -Group instruction provided by PowerPoint slides, verbal discussion, and written materials to support subject matter. The instructor gives an explanation and review of the physiology behind type 1 and type 2 diabetes, diabetes medications and rational behind using different medications, pre- and post-prandial blood glucose recommendations and Hemoglobin A1c goals, diabetes diet, and exercise including blood glucose guidelines for exercising safely.    Portion Distortion:  -Group instruction provided by PowerPoint slides, verbal discussion, written materials, and food models to support subject matter. The instructor gives an explanation of serving size versus portion size, changes in portions sizes over the last 20 years, and what consists of a serving  from each food group.   Stress Management:  -Group instruction provided by verbal instruction, video, and written materials to support subject matter.  Instructors review role of stress in heart disease and how to cope with stress positively.     CARDIAC REHAB PHASE II EXERCISE from 06/02/2018 in [place]  Date  05/03/18  Educator  RN  Instruction Review Code  2- Demonstrated Understanding      Exercising on Your Own:  -Group instruction provided by verbal instruction, power point, and written materials to support subject.  Instructors discuss benefits of exercise, components of exercise, frequency and intensity of exercise, and end points for exercise.  Also discuss use of nitroglycerin and activating EMS.  Review options of places to exercise outside of rehab.  Review guidelines for sex with heart disease.   Cardiac Drugs I:  -Group instruction provided by verbal instruction and written materials to support subject.  Instructor reviews cardiac drug classes: antiplatelets, anticoagulants, beta blockers, and statins.  Instructor discusses reasons, side effects, and lifestyle considerations for each drug class.   CARDIAC REHAB PHASE II EXERCISE from 06/02/2018 in [place]  Date  04/19/18  Instruction Review Code  2- Demonstrated Understanding      Cardiac Drugs II:  -Group instruction provided by verbal instruction and written materials to support subject.  Instructor reviews cardiac drug classes: angiotensin converting enzyme inhibitors (ACE-I), angiotensin II receptor blockers (ARBs), nitrates, and calcium channel blockers.  Instructor discusses reasons, side effects, and lifestyle considerations for each drug class.   CARDIAC REHAB PHASE II EXERCISE from 06/02/2018 in [place]  Date  05/24/18  Instruction Review Code  2- Demonstrated Understanding      Anatomy and Physiology of the  Circulatory System:  Group verbal and written instruction and models provide basic cardiac anatomy and physiology, with the coronary electrical and arterial systems. Review of: AMI, Angina, Valve disease, Heart Failure, Peripheral Artery Disease, Cardiac Arrhythmia, Pacemakers, and the ICD.   CARDIAC REHAB PHASE II EXERCISE from 06/02/2018 in [place]  Date  03/15/18  Instruction Review Code  2- Demonstrated Understanding      Other Education:  -Group or individual verbal, written, or video instructions that support the educational goals of the cardiac rehab program.   Holiday Eating Survival Tips:  -Group instruction provided by PowerPoint slides, verbal discussion, and written materials to support subject matter. The instructor gives patients tips, tricks, and techniques to help them not only survive but enjoy the holidays despite the onslaught of food that accompanies the holidays.   Knowledge Questionnaire Score: Knowledge Questionnaire Score - 03/07/18 1145      Knowledge Questionnaire Score   Pre Score  17/24       Core Components/Risk Factors/Patient Goals at Admission: Personal Goals and Risk Factors at Admission - 03/07/18 1150      Core Components/Risk Factors/Patient Goals on Admission   Diabetes  Yes    Intervention  Provide education about signs/symptoms and action to take for hypo/hyperglycemia.;Provide education about proper nutrition, including hydration, and aerobic/resistive exercise prescription along with prescribed medications to achieve blood glucose in normal ranges: Fasting glucose 65-99 mg/dL    Expected Outcomes  Long Term: Attainment of HbA1C < 7%.;Short Term: Participant verbalizes understanding of the signs/symptoms and immediate care of hyper/hypoglycemia, proper foot care and importance of medication, aerobic/resistive exercise and nutrition plan for blood glucose control.    Hypertension  Yes    Intervention  Provide  education on lifestyle modifcations including regular physical activity/exercise, weight management, moderate sodium restriction and increased consumption of fresh fruit, vegetables, and low fat dairy, alcohol moderation, and smoking cessation.;Monitor prescription use compliance.    Expected Outcomes  Short Term: Continued assessment and intervention until BP is < 140/101mm HG in hypertensive participants. < 130/51mm HG in hypertensive participants with diabetes, heart failure or chronic kidney disease.;Long Term: Maintenance of blood pressure at goal levels.    Lipids  Yes    Intervention  Provide education and support for participant on nutrition & aerobic/resistive exercise along with prescribed medications to achieve LDL 70mg , HDL >40mg .    Expected Outcomes  Short Term: Participant states understanding of desired cholesterol values and is compliant with medications prescribed. Participant is following exercise prescription and nutrition guidelines.;Long Term: Cholesterol controlled with medications as prescribed, with individualized exercise RX and with personalized nutrition plan. Value goals: LDL < 70mg , HDL > 40 mg.       Core Components/Risk Factors/Patient Goals Review:  Goals and Risk Factor Review    Row Name 03/13/18 1542 03/23/18 0750 04/19/18 1123 05/17/18 1505 06/15/18 0946     Core Components/Risk Factors/Patient Goals Review   Personal Goals Review  Lipids;Diabetes;Hypertension  Lipids;Diabetes;Hypertension  Lipids;Diabetes;Hypertension  Lipids;Diabetes;Hypertension  Lipids;Diabetes;Hypertension   Review  Pt with multiple CAD RF willing to participate in [place] is looking forward to increasing his strength and stamina.  Pt with multiple CAD RF willing to participate in [place] is looking forward to increasing his strength and stamina.  Pt with multiple CAD RF willing to participate in [place] feels  that he is increasing his strength.  He has  started walking the distance from the entrance whereas previously he has been wheeled down.   Pt with multiple CAD RF willing to participate in [place] continues to have increased strength and stamina.  He has continued to walk from the main entrance before exercise.   Pt with multiple CAD RF willing to participate in [place] continues to do well with exercise.  He is still able to walk from the main entrance before exercise.  He enjoys coming to [facility].    Expected Outcomes  Pt will continue to participate in [facility] exercise, nutrition, and lifestyle modification opportunities.  Pt will continue to participate in [facility] exercise, nutrition, and lifestyle modification opportunities.  Pt will continue to participate in [facility] exercise, nutrition, and lifestyle modification opportunities.  Pt will continue to participate in [facility] exercise, nutrition, and lifestyle modification opportunities.  Pt will continue to participate in [facility] exercise, nutrition, and lifestyle modification opportunities.      Core Components/Risk Factors/Patient Goals at Discharge (Final Review):  Goals and Risk Factor Review - 06/15/18 0946      Core Components/Risk Factors/Patient Goals Review   Personal Goals Review  Lipids;Diabetes;Hypertension    Review  Pt with multiple CAD RF willing to participate in [place] continues to do well with exercise.  He is still able to walk from the main entrance before exercise.  He enjoys coming to CR.     Expected Outcomes  Pt will continue to participate in [facility] exercise, nutrition, and lifestyle modification opportunities.       ITP Comments: ITP Comments    Row Name 03/07/18 0919 03/23/18 0750 04/19/18 1123 05/17/18 1504 06/15/18 0934   ITP Comments  Dr. Fransico Him, Medical Director  30 Day ITP Review.  [place] has started CR and is tolerating exercise well.  30 Day ITP Review.  [place] is tolerating exercise well and feels that he is increasing his strength.  His CBG is  normalizing.  30 Day ITP Review. Jadarius is doing well with exercise. He was out briefly for issues with arthritis but this has lessened and he has been able to return to exercise.   30 Day ITP Review. Souleymane is doing well with exercise.  He enjoys coming to cardiac rehab and connecting with other patients.  [place] will graduate soon.       Comments: See ITP Comments.

## 2018-06-16 ENCOUNTER — Encounter (HOSPITAL_COMMUNITY)
Admission: RE | Admit: 2018-06-16 | Discharge: 2018-06-16 | Disposition: A | Discharge status: Home / Self Care | Payer: No Typology Code available for payment source | Source: Ambulatory Visit | Attending: Cardiology | Admitting: Cardiology

## 2018-06-16 VITALS — Ht 69.0 in | Wt 182.3 lb

## 2018-06-16 DIAGNOSIS — Z79899 Other long term (current) drug therapy: Secondary | ICD-10-CM | POA: Diagnosis not present

## 2018-06-16 DIAGNOSIS — G4730 Sleep apnea, unspecified: Secondary | ICD-10-CM | POA: Diagnosis not present

## 2018-06-16 DIAGNOSIS — I2510 Atherosclerotic heart disease of native coronary artery without angina pectoris: Secondary | ICD-10-CM | POA: Diagnosis not present

## 2018-06-16 DIAGNOSIS — Z951 Presence of aortocoronary bypass graft: Secondary | ICD-10-CM | POA: Insufficient documentation

## 2018-06-16 DIAGNOSIS — F4310 Post-traumatic stress disorder, unspecified: Secondary | ICD-10-CM | POA: Diagnosis not present

## 2018-06-16 DIAGNOSIS — I10 Essential (primary) hypertension: Secondary | ICD-10-CM | POA: Insufficient documentation

## 2018-06-16 DIAGNOSIS — Z7984 Long term (current) use of oral hypoglycemic drugs: Secondary | ICD-10-CM | POA: Insufficient documentation

## 2018-06-16 DIAGNOSIS — E119 Type 2 diabetes mellitus without complications: Secondary | ICD-10-CM | POA: Diagnosis not present

## 2018-06-16 LAB — GLUCOSE, CAPILLARY: Glucose-Capillary: 168 mg/dL — ABNORMAL HIGH (ref 70–99)

## 2018-06-19 ENCOUNTER — Encounter (HOSPITAL_COMMUNITY)
Admission: RE | Admit: 2018-06-19 | Discharge: 2018-06-19 | Disposition: A | Discharge status: Home / Self Care | Payer: No Typology Code available for payment source | Source: Ambulatory Visit | Attending: Cardiology | Admitting: Cardiology

## 2018-06-19 DIAGNOSIS — Z951 Presence of aortocoronary bypass graft: Secondary | ICD-10-CM

## 2018-06-19 DIAGNOSIS — I2510 Atherosclerotic heart disease of native coronary artery without angina pectoris: Secondary | ICD-10-CM | POA: Diagnosis not present

## 2018-06-19 LAB — GLUCOSE, CAPILLARY: GLUCOSE-CAPILLARY: 150 mg/dL — AB (ref 70–99)

## 2018-06-21 ENCOUNTER — Encounter (HOSPITAL_COMMUNITY)
Admission: RE | Admit: 2018-06-21 | Discharge: 2018-06-21 | Disposition: A | Discharge status: Home / Self Care | Payer: No Typology Code available for payment source | Source: Ambulatory Visit | Attending: Cardiology | Admitting: Cardiology

## 2018-06-21 DIAGNOSIS — Z951 Presence of aortocoronary bypass graft: Secondary | ICD-10-CM

## 2018-06-21 DIAGNOSIS — I2510 Atherosclerotic heart disease of native coronary artery without angina pectoris: Secondary | ICD-10-CM | POA: Diagnosis not present

## 2018-06-21 LAB — GLUCOSE, CAPILLARY: Glucose-Capillary: 117 mg/dL — ABNORMAL HIGH (ref 70–99)

## 2018-06-23 ENCOUNTER — Encounter (HOSPITAL_COMMUNITY)
Admission: RE | Admit: 2018-06-23 | Discharge: 2018-06-23 | Disposition: A | Discharge status: Home / Self Care | Payer: No Typology Code available for payment source | Source: Ambulatory Visit | Attending: Cardiology | Admitting: Cardiology

## 2018-06-23 DIAGNOSIS — I2510 Atherosclerotic heart disease of native coronary artery without angina pectoris: Secondary | ICD-10-CM | POA: Diagnosis not present

## 2018-06-23 DIAGNOSIS — Z951 Presence of aortocoronary bypass graft: Secondary | ICD-10-CM

## 2018-06-23 LAB — GLUCOSE, CAPILLARY: Glucose-Capillary: 213 mg/dL — ABNORMAL HIGH (ref 70–99)

## 2018-06-26 ENCOUNTER — Encounter (HOSPITAL_COMMUNITY)
Admission: RE | Admit: 2018-06-26 | Discharge: 2018-06-26 | Disposition: A | Discharge status: Home / Self Care | Payer: No Typology Code available for payment source | Source: Ambulatory Visit | Attending: Cardiology | Admitting: Cardiology

## 2018-06-26 DIAGNOSIS — Z951 Presence of aortocoronary bypass graft: Secondary | ICD-10-CM

## 2018-06-26 DIAGNOSIS — I2510 Atherosclerotic heart disease of native coronary artery without angina pectoris: Secondary | ICD-10-CM | POA: Diagnosis not present

## 2018-06-28 ENCOUNTER — Encounter (HOSPITAL_COMMUNITY)
Admission: RE | Admit: 2018-06-28 | Discharge: 2018-06-28 | Disposition: A | Discharge status: Home / Self Care | Payer: No Typology Code available for payment source | Source: Ambulatory Visit | Attending: Cardiology | Admitting: Cardiology

## 2018-06-28 DIAGNOSIS — I2510 Atherosclerotic heart disease of native coronary artery without angina pectoris: Secondary | ICD-10-CM | POA: Diagnosis not present

## 2018-06-28 DIAGNOSIS — Z951 Presence of aortocoronary bypass graft: Secondary | ICD-10-CM

## 2018-06-30 ENCOUNTER — Encounter (HOSPITAL_COMMUNITY)
Admission: RE | Admit: 2018-06-30 | Discharge: 2018-06-30 | Disposition: A | Discharge status: Home / Self Care | Payer: No Typology Code available for payment source | Source: Ambulatory Visit | Attending: Cardiology | Admitting: Cardiology

## 2018-06-30 DIAGNOSIS — Z951 Presence of aortocoronary bypass graft: Secondary | ICD-10-CM

## 2018-06-30 DIAGNOSIS — I2510 Atherosclerotic heart disease of native coronary artery without angina pectoris: Secondary | ICD-10-CM | POA: Diagnosis not present

## 2018-07-03 ENCOUNTER — Encounter (HOSPITAL_COMMUNITY): Payer: Self-pay

## 2018-07-10 NOTE — Progress Notes (Signed)
Discharge Progress Report  Patient Details  Name: Derek [name] MRN: [id number] Date of Birth: [birth year] Referring Provider:     CARDIAC REHAB PHASE II ORIENTATION from 03/07/2018 in [facility]  Referring Provider  Olena Leatherwood, MD ([facility] coverage)       Number of Visits: 36  Reason for Discharge:  Patient reached a stable level of exercise. Patient independent in their exercise. Patient has met program and personal goals.  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 at [facility]:   Initial Exercise Prescription: Initial Exercise Prescription - 03/07/18 1100      Date of Initial Exercise RX and Referring Provider   Date  03/07/18    Referring Provider  Olena Leatherwood, MD ([facility] coverage)      Treadmill   MPH  2.2    Grade  0    Minutes  10    METs  2.68      Bike   Level  0.5    Minutes  10    METs  2.2      NuStep   Level  3    SPM  80    Minutes  10    METs  2      Prescription Details   Frequency (times per week)  3    Duration  Progress to 30 minutes of continuous aerobic without signs/symptoms of physical distress      Intensity   THRR 40-80% of Max Heartrate  60-121    Ratings of Perceived Exertion  11-13    Perceived Dyspnea  0-4      Progression   Progression  Continue to progress workloads to maintain intensity without signs/symptoms of physical distress.      Resistance Training   Training Prescription  Yes    Weight  2lbs    Reps  10-15       Discharge Exercise Prescription (Final Exercise Prescription Changes): Exercise Prescription Changes - 06/30/18 1400      Response to Exercise   Blood Pressure (Admit)  118/78    Blood Pressure (Exercise)  122/78    Blood Pressure (Exit)  108/62    Heart Rate (Admit)  107 bpm    Heart Rate (Exercise)  127  bpm    Heart Rate (Exit)  107 bpm    Rating of Perceived Exertion (Exercise)  11    Perceived Dyspnea (Exercise)  0    Symptoms  None     Comments  Pt graduated from Cardiac Rehab     Duration  Continue with 45 min of aerobic exercise without signs/symptoms of physical distress.    Intensity  THRR unchanged      Progression   Progression  Continue to progress workloads to maintain intensity without signs/symptoms of physical distress.    Average METs  3.5      Resistance Training   Training Prescription  Yes    Weight  4lbs    Reps  10-15    Time  10 Minutes      Interval Training   Interval Training  No      Treadmill   MPH  2.8    Grade  2    Minutes  10    METs  3.91      Bike  Level  1.2    Minutes  10    METs  3.77      NuStep   Level  5    SPM  105    Minutes  10    METs  2.7      Home Exercise Plan   Plans to continue exercise at  [facility] (comment)   [facility] [employer]    Frequency  Add 3 additional days to program exercise sessions.    Initial Home Exercises Provided  04/03/18       Functional Capacity: 6 Minute Walk    Row Name 03/07/18 1149 06/16/18 1403       6 Minute Walk   Phase  Initial  Discharge    Distance  1442 feet  1800 feet    Distance % Change  -  24.83 %    Distance Feet Change  -  358 ft    Walk Time  6 minutes  6 minutes    # of Rest Breaks  0  0    MPH  2.7  3.41    METS  3.5  4.27    RPE  12  11    Perceived Dyspnea   -  0    VO2 Peak  12.21  14.94    Symptoms  No  No    Resting HR  69 bpm  72 bpm    Resting BP  122/76  122/70    Resting Oxygen Saturation   96 %  -    Exercise Oxygen Saturation  during 6 min walk  97 %  -    Max Ex. HR  117 bpm  121 bpm    Max Ex. BP  146/82  156/72    2 Minute Post BP  118/78  124/78       Psychological, QOL, Others - Outcomes: PHQ 2/9: Depression screen [facility] 2/9 07/03/2018 03/13/2018  Decreased Interest 0 0  Down, Depressed, Hopeless 0 0  PHQ - 2 Score 0 0    Quality of  Life: Quality of Life - 06/30/18 1439      Quality of Life   Select  Quality of Life      Quality of Life Scores   Health/Function Pre  30 %    Health/Function Post  28 %    Health/Function % Change  -6.67 %    Socioeconomic Pre  30 %    Socioeconomic Post  25.71 %    Socioeconomic % Change   -14.3 %    Psych/Spiritual Pre  30 %    Psych/Spiritual Post  30 %    Psych/Spiritual % Change  0 %    Family Pre  30 %    Family Post  30 %    Family % Change  0 %    GLOBAL Pre  30 %    GLOBAL Post  28.24 %    GLOBAL % Change  -5.87 %       Personal Goals: Goals established at orientation with interventions provided to work toward goal. Personal Goals and Risk Factors at Admission - 03/07/18 1150      Core Components/Risk Factors/Patient Goals on Admission   Diabetes  Yes    Intervention  Provide education about signs/symptoms and action to take for hypo/hyperglycemia.;Provide education about proper nutrition, including hydration, and aerobic/resistive exercise prescription along with prescribed medications to achieve blood glucose in normal ranges: Fasting glucose 65-99 mg/dL    Expected Outcomes  Long Term: Attainment of HbA1C < 7%.;Short Term: Participant verbalizes understanding of the signs/symptoms and immediate care of hyper/hypoglycemia, proper foot care and importance of medication, aerobic/resistive exercise and nutrition plan for blood glucose control.    Hypertension  Yes    Intervention  Provide education on lifestyle modifcations including regular physical activity/exercise, weight management, moderate sodium restriction and increased consumption of fresh fruit, vegetables, and low fat dairy, alcohol moderation, and smoking cessation.;Monitor prescription use compliance.    Expected Outcomes  Short Term: Continued assessment and intervention until BP is < 140/62m HG in hypertensive participants. < 130/861mHG in hypertensive participants with diabetes, heart failure or chronic  kidney disease.;Long Term: Maintenance of blood pressure at goal levels.    Lipids  Yes    Intervention  Provide education and support for participant on nutrition & aerobic/resistive exercise along with prescribed medications to achieve LDL <7049mHDL >20m33m  Expected Outcomes  Short Term: Participant states understanding of desired cholesterol values and is compliant with medications prescribed. Participant is following exercise prescription and nutrition guidelines.;Long Term: Cholesterol controlled with medications as prescribed, with individualized exercise RX and with personalized nutrition plan. Value goals: LDL < 70mg22mL > 40 mg.        Personal Goals Discharge: Goals and Risk Factor Review    Row Name 03/13/18 1542 03/23/18 0750 04/19/18 1123 05/17/18 1505 06/15/18 0946     Core Components/Risk Factors/Patient Goals Review   Personal Goals Review  Lipids;Diabetes;Hypertension  Lipids;Diabetes;Hypertension  Lipids;Diabetes;Hypertension  Lipids;Diabetes;Hypertension  Lipids;Diabetes;Hypertension   Review  Pt with multiple CAD RF willing to participate in [place] to increasing his strength and stamina.  Pt with multiple CAD RF willing to participate in CR [place] to increasing his strength and stamina.  Pt with multiple CAD RF willing to participate in CR [place] that he is increasing his strength.  He has started walking the distance from the entrance whereas previously he has been wheeled down.   Pt with multiple CAD RF willing to participate in CR Pr[place] to have increased strength and stamina.  He has continued to walk from the main entrance before exercise.   Pt with multiple CAD RF willing to participate in CR [place] to do well with exercise.  He is still able to walk from the main entrance before exercise.  He enjoys coming to CR.    Expected Outcomes  Pt will continue to  participate in CR exercise, nutrition, and lifestyle modification opportunities.  Pt will continue to participate in CR exercise, nutrition, and lifestyle modification opportunities.  Pt will continue to participate in CR exercise, nutrition, and lifestyle modification opportunities.  Pt will continue to participate in CR exercise, nutrition, and lifestyle modification opportunities.  Pt will continue to participate in CR exercise, nutrition, and lifestyle modification opportunities.   Row [place] 07/03/18 0930             Core Components/Risk Factors/Patient Goals Review   Personal Goals Review  Lipids;Diabetes;Hypertension       Review  Pt with multiple CAD RF has completed the CR Programwith 36 sessions. Adyen did well with exercise.  He was very appreciative of the staff.       Expected Outcomes  Pt will continue to participate in exercise, nutrition, and lifestyle modification opportunities at home.  [facility]  Exercise Goals and Review: Exercise Goals    Row Name 03/07/18 0930             Exercise Goals   Increase Physical Activity  Yes       Intervention  Provide advice, education, support and counseling about physical activity/exercise needs.;Develop an individualized exercise prescription for aerobic and resistive training based on initial evaluation findings, risk stratification, comorbidities and participant's personal goals.       Expected Outcomes  Short Term: Attend rehab on a regular basis to increase amount of physical activity.;Long Term: Exercising regularly at least 3-5 days a week.;Long Term: Add in home exercise to make exercise part of routine and to increase amount of physical activity.       Increase Strength and Stamina  Yes return to gym-like routine and stay consistent with exercise routine       Intervention  Provide advice, education, support and counseling about physical activity/exercise needs.;Develop an individualized  exercise prescription for aerobic and resistive training based on initial evaluation findings, risk stratification, comorbidities and participant's personal goals.       Expected Outcomes  Short Term: Increase workloads from initial exercise prescription for resistance, speed, and METs.;Short Term: Perform resistance training exercises routinely during rehab and add in resistance training at home;Long Term: Improve cardiorespiratory fitness, muscular endurance and strength as measured by increased METs and functional capacity (6MWT)       Able to understand and use rate of perceived exertion (RPE) scale  Yes       Intervention  Provide education and explanation on how to use RPE scale       Expected Outcomes  Short Term: Able to use RPE daily in rehab to express subjective intensity level;Long Term:  Able to use RPE to guide intensity level when exercising independently       Knowledge and understanding of Target Heart Rate Range (THRR)  Yes       Intervention  Provide education and explanation of THRR including how the numbers were predicted and where they are located for reference       Expected Outcomes  Short Term: Able to state/look up THRR;Long Term: Able to use THRR to govern intensity when exercising independently;Short Term: Able to use daily as guideline for intensity in rehab       Able to check pulse independently  Yes       Intervention  Provide education and demonstration on how to check pulse in carotid and radial arteries.;Review the importance of being able to check your own pulse for safety during independent exercise       Expected Outcomes  Short Term: Able to explain why pulse checking is important during independent exercise;Long Term: Able to check pulse independently and accurately       Understanding of Exercise Prescription  Yes       Intervention  Provide education, explanation, and written materials on patient's individual exercise prescription       Expected Outcomes  Short  Term: Able to explain program exercise prescription;Long Term: Able to explain home exercise prescription to exercise independently          Nutrition & Weight - Outcomes: Pre Biometrics - 03/07/18 1150      Pre Biometrics   Height  5' 9"  (1.753 m)    Weight  86.6 kg    Waist Circumference  39 inches    Hip Circumference  38.5 inches    Waist to Hip Ratio  1.01 %  BMI (Calculated)  28.18    Triceps Skinfold  10 mm    % Body Fat  25.5 %    Grip Strength  18 kg    Flexibility  0 in    Single Leg Stand  4.22 seconds      Post Biometrics - 06/16/18 1404       Post  Biometrics   Height  5' 9"  (1.753 m)    Weight  82.7 kg    Waist Circumference  37 inches    Hip Circumference  36.5 inches    Waist to Hip Ratio  1.01 %    BMI (Calculated)  26.91    Triceps Skinfold  8 mm    % Body Fat  23.3 %    Grip Strength  20 kg    Flexibility  10 in    Single Leg Stand  9.9 seconds       Nutrition: Nutrition Therapy & Goals - 03/15/18 1213      Nutrition Therapy   Diet  Consistent Carb, Heart Healthy      Personal Nutrition Goals   Nutrition Goal  Pt to identify food quantities necessary to achieve weight loss of 5 lb at graduation from cardiac rehab. Wt loss goal wt of 185 lb desired.       Intervention Plan   Intervention  Prescribe, educate and counsel regarding individualized specific dietary modifications aiming towards targeted core components such as weight, hypertension, lipid management, diabetes, heart failure and other comorbidities.    Expected Outcomes  Short Term Goal: Understand basic principles of dietary content, such as calories, fat, sodium, cholesterol and nutrients.;Long Term Goal: Adherence to prescribed nutrition plan.       Nutrition Discharge: Nutrition Assessments - 07/03/18 1154      MEDFICTS Scores   Pre Score  24    Post Score  --   pt did not return medficts      Education Questionnaire Score: Knowledge Questionnaire Score - 06/30/18 1439       Knowledge Questionnaire Score   Pre Score  17/24    Post Score  16/24       Goals reviewed with patient; copy given to patient.

## 2019-11-26 ENCOUNTER — Ambulatory Visit: Payer: No Typology Code available for payment source | Attending: Internal Medicine

## 2021-03-03 DIAGNOSIS — M1A09X0 Idiopathic chronic gout, multiple sites, without tophus (tophi): Secondary | ICD-10-CM | POA: Diagnosis not present

## 2021-03-03 DIAGNOSIS — M0579 Rheumatoid arthritis with rheumatoid factor of multiple sites without organ or systems involvement: Secondary | ICD-10-CM | POA: Diagnosis not present

## 2021-03-03 DIAGNOSIS — Z6825 Body mass index (BMI) 25.0-25.9, adult: Secondary | ICD-10-CM | POA: Diagnosis not present

## 2021-03-03 DIAGNOSIS — E663 Overweight: Secondary | ICD-10-CM | POA: Diagnosis not present

## 2021-05-01 DIAGNOSIS — G4733 Obstructive sleep apnea (adult) (pediatric): Secondary | ICD-10-CM | POA: Diagnosis present

## 2021-05-01 DIAGNOSIS — M069 Rheumatoid arthritis, unspecified: Secondary | ICD-10-CM | POA: Diagnosis present

## 2021-05-01 DIAGNOSIS — I10 Essential (primary) hypertension: Secondary | ICD-10-CM | POA: Diagnosis present

## 2021-06-23 DIAGNOSIS — E7800 Pure hypercholesterolemia, unspecified: Secondary | ICD-10-CM | POA: Diagnosis not present

## 2021-06-23 DIAGNOSIS — E1169 Type 2 diabetes mellitus with other specified complication: Secondary | ICD-10-CM | POA: Diagnosis not present

## 2021-06-23 DIAGNOSIS — H409 Unspecified glaucoma: Secondary | ICD-10-CM | POA: Diagnosis not present

## 2021-06-23 DIAGNOSIS — I10 Essential (primary) hypertension: Secondary | ICD-10-CM | POA: Diagnosis not present

## 2021-06-23 DIAGNOSIS — R634 Abnormal weight loss: Secondary | ICD-10-CM | POA: Diagnosis not present

## 2021-06-23 DIAGNOSIS — F4310 Post-traumatic stress disorder, unspecified: Secondary | ICD-10-CM | POA: Diagnosis not present

## 2021-06-23 DIAGNOSIS — F331 Major depressive disorder, recurrent, moderate: Secondary | ICD-10-CM | POA: Diagnosis not present

## 2021-06-23 DIAGNOSIS — F09 Unspecified mental disorder due to known physiological condition: Secondary | ICD-10-CM | POA: Diagnosis not present

## 2021-07-27 DIAGNOSIS — M1A09X0 Idiopathic chronic gout, multiple sites, without tophus (tophi): Secondary | ICD-10-CM | POA: Diagnosis not present

## 2021-07-27 DIAGNOSIS — M0579 Rheumatoid arthritis with rheumatoid factor of multiple sites without organ or systems involvement: Secondary | ICD-10-CM | POA: Diagnosis not present

## 2021-07-27 DIAGNOSIS — Z6825 Body mass index (BMI) 25.0-25.9, adult: Secondary | ICD-10-CM | POA: Diagnosis not present

## 2021-07-27 DIAGNOSIS — E663 Overweight: Secondary | ICD-10-CM | POA: Diagnosis not present

## 2021-11-03 DIAGNOSIS — M0579 Rheumatoid arthritis with rheumatoid factor of multiple sites without organ or systems involvement: Secondary | ICD-10-CM | POA: Diagnosis not present

## 2021-11-03 DIAGNOSIS — Z6824 Body mass index (BMI) 24.0-24.9, adult: Secondary | ICD-10-CM | POA: Diagnosis not present

## 2021-11-03 DIAGNOSIS — M1A09X0 Idiopathic chronic gout, multiple sites, without tophus (tophi): Secondary | ICD-10-CM | POA: Diagnosis not present

## 2021-12-21 DIAGNOSIS — E781 Pure hyperglyceridemia: Secondary | ICD-10-CM | POA: Diagnosis not present

## 2021-12-21 DIAGNOSIS — E119 Type 2 diabetes mellitus without complications: Secondary | ICD-10-CM | POA: Diagnosis not present

## 2021-12-21 DIAGNOSIS — M109 Gout, unspecified: Secondary | ICD-10-CM | POA: Diagnosis not present

## 2021-12-21 DIAGNOSIS — I10 Essential (primary) hypertension: Secondary | ICD-10-CM | POA: Diagnosis not present

## 2022-01-25 DIAGNOSIS — M25552 Pain in left hip: Secondary | ICD-10-CM | POA: Diagnosis not present

## 2022-01-25 DIAGNOSIS — M25562 Pain in left knee: Secondary | ICD-10-CM | POA: Diagnosis not present

## 2022-01-26 ENCOUNTER — Other Ambulatory Visit: Payer: Self-pay | Admitting: Internal Medicine

## 2022-01-26 ENCOUNTER — Ambulatory Visit
Admission: RE | Admit: 2022-01-26 | Discharge: 2022-01-26 | Disposition: A | Discharge status: Home / Self Care | Payer: Medicare PPO | Source: Ambulatory Visit | Attending: Internal Medicine | Admitting: Internal Medicine

## 2022-01-26 DIAGNOSIS — M79652 Pain in left thigh: Secondary | ICD-10-CM | POA: Diagnosis not present

## 2022-01-26 DIAGNOSIS — M25552 Pain in left hip: Secondary | ICD-10-CM

## 2022-01-26 DIAGNOSIS — W19XXXA Unspecified fall, initial encounter: Secondary | ICD-10-CM

## 2022-01-26 DIAGNOSIS — M1612 Unilateral primary osteoarthritis, left hip: Secondary | ICD-10-CM | POA: Diagnosis not present

## 2022-02-02 DIAGNOSIS — M0579 Rheumatoid arthritis with rheumatoid factor of multiple sites without organ or systems involvement: Secondary | ICD-10-CM | POA: Diagnosis not present

## 2022-02-11 ENCOUNTER — Other Ambulatory Visit: Payer: Self-pay

## 2022-02-11 ENCOUNTER — Emergency Department (HOSPITAL_BASED_OUTPATIENT_CLINIC_OR_DEPARTMENT_OTHER): Payer: No Typology Code available for payment source

## 2022-02-11 ENCOUNTER — Emergency Department (HOSPITAL_BASED_OUTPATIENT_CLINIC_OR_DEPARTMENT_OTHER): Payer: No Typology Code available for payment source | Admitting: Radiology

## 2022-02-11 ENCOUNTER — Inpatient Hospital Stay (HOSPITAL_BASED_OUTPATIENT_CLINIC_OR_DEPARTMENT_OTHER)
Admission: EM | Admit: 2022-02-11 | Discharge: 2022-02-17 | DRG: 481 | Disposition: A | Discharge status: Skilled Nursing Facility | Payer: No Typology Code available for payment source | Attending: Internal Medicine | Admitting: Internal Medicine

## 2022-02-11 ENCOUNTER — Encounter (HOSPITAL_BASED_OUTPATIENT_CLINIC_OR_DEPARTMENT_OTHER): Payer: Self-pay | Admitting: Emergency Medicine

## 2022-02-11 DIAGNOSIS — Z8719 Personal history of other diseases of the digestive system: Secondary | ICD-10-CM | POA: Diagnosis not present

## 2022-02-11 DIAGNOSIS — Z79631 Long term (current) use of antimetabolite agent: Secondary | ICD-10-CM

## 2022-02-11 DIAGNOSIS — G3184 Mild cognitive impairment, so stated: Secondary | ICD-10-CM | POA: Diagnosis present

## 2022-02-11 DIAGNOSIS — Z7984 Long term (current) use of oral hypoglycemic drugs: Secondary | ICD-10-CM

## 2022-02-11 DIAGNOSIS — I10 Essential (primary) hypertension: Secondary | ICD-10-CM | POA: Diagnosis present

## 2022-02-11 DIAGNOSIS — Z7952 Long term (current) use of systemic steroids: Secondary | ICD-10-CM

## 2022-02-11 DIAGNOSIS — F32A Depression, unspecified: Secondary | ICD-10-CM | POA: Diagnosis present

## 2022-02-11 DIAGNOSIS — M6281 Muscle weakness (generalized): Secondary | ICD-10-CM | POA: Diagnosis not present

## 2022-02-11 DIAGNOSIS — Z8249 Family history of ischemic heart disease and other diseases of the circulatory system: Secondary | ICD-10-CM | POA: Diagnosis not present

## 2022-02-11 DIAGNOSIS — S72012A Unspecified intracapsular fracture of left femur, initial encounter for closed fracture: Secondary | ICD-10-CM | POA: Diagnosis present

## 2022-02-11 DIAGNOSIS — M1990 Unspecified osteoarthritis, unspecified site: Secondary | ICD-10-CM | POA: Diagnosis present

## 2022-02-11 DIAGNOSIS — H401134 Primary open-angle glaucoma, bilateral, indeterminate stage: Secondary | ICD-10-CM | POA: Diagnosis present

## 2022-02-11 DIAGNOSIS — M069 Rheumatoid arthritis, unspecified: Secondary | ICD-10-CM | POA: Diagnosis present

## 2022-02-11 DIAGNOSIS — S72032A Displaced midcervical fracture of left femur, initial encounter for closed fracture: Principal | ICD-10-CM | POA: Diagnosis present

## 2022-02-11 DIAGNOSIS — E782 Mixed hyperlipidemia: Secondary | ICD-10-CM | POA: Diagnosis present

## 2022-02-11 DIAGNOSIS — Z8781 Personal history of (healed) traumatic fracture: Principal | ICD-10-CM

## 2022-02-11 DIAGNOSIS — R1312 Dysphagia, oropharyngeal phase: Secondary | ICD-10-CM | POA: Diagnosis not present

## 2022-02-11 DIAGNOSIS — F0390 Unspecified dementia without behavioral disturbance: Secondary | ICD-10-CM | POA: Diagnosis present

## 2022-02-11 DIAGNOSIS — Z9181 History of falling: Secondary | ICD-10-CM

## 2022-02-11 DIAGNOSIS — I2510 Atherosclerotic heart disease of native coronary artery without angina pectoris: Secondary | ICD-10-CM | POA: Diagnosis present

## 2022-02-11 DIAGNOSIS — Z8673 Personal history of transient ischemic attack (TIA), and cerebral infarction without residual deficits: Secondary | ICD-10-CM

## 2022-02-11 DIAGNOSIS — Z8711 Personal history of peptic ulcer disease: Secondary | ICD-10-CM | POA: Diagnosis not present

## 2022-02-11 DIAGNOSIS — F4310 Post-traumatic stress disorder, unspecified: Secondary | ICD-10-CM | POA: Diagnosis present

## 2022-02-11 DIAGNOSIS — Z96641 Presence of right artificial hip joint: Secondary | ICD-10-CM | POA: Diagnosis present

## 2022-02-11 DIAGNOSIS — Y92009 Unspecified place in unspecified non-institutional (private) residence as the place of occurrence of the external cause: Secondary | ICD-10-CM

## 2022-02-11 DIAGNOSIS — R41841 Cognitive communication deficit: Secondary | ICD-10-CM | POA: Diagnosis not present

## 2022-02-11 DIAGNOSIS — K5900 Constipation, unspecified: Secondary | ICD-10-CM | POA: Diagnosis present

## 2022-02-11 DIAGNOSIS — G4700 Insomnia, unspecified: Secondary | ICD-10-CM | POA: Diagnosis present

## 2022-02-11 DIAGNOSIS — Z951 Presence of aortocoronary bypass graft: Secondary | ICD-10-CM

## 2022-02-11 DIAGNOSIS — R498 Other voice and resonance disorders: Secondary | ICD-10-CM | POA: Diagnosis not present

## 2022-02-11 DIAGNOSIS — G4733 Obstructive sleep apnea (adult) (pediatric): Secondary | ICD-10-CM | POA: Diagnosis present

## 2022-02-11 DIAGNOSIS — S3210XA Unspecified fracture of sacrum, initial encounter for closed fracture: Secondary | ICD-10-CM

## 2022-02-11 DIAGNOSIS — Z6826 Body mass index (BMI) 26.0-26.9, adult: Secondary | ICD-10-CM | POA: Diagnosis not present

## 2022-02-11 DIAGNOSIS — E119 Type 2 diabetes mellitus without complications: Secondary | ICD-10-CM | POA: Diagnosis present

## 2022-02-11 DIAGNOSIS — W1830XA Fall on same level, unspecified, initial encounter: Secondary | ICD-10-CM | POA: Diagnosis present

## 2022-02-11 DIAGNOSIS — F4312 Post-traumatic stress disorder, chronic: Secondary | ICD-10-CM | POA: Diagnosis present

## 2022-02-11 DIAGNOSIS — S72002A Fracture of unspecified part of neck of left femur, initial encounter for closed fracture: Secondary | ICD-10-CM | POA: Diagnosis present

## 2022-02-11 DIAGNOSIS — M79605 Pain in left leg: Secondary | ICD-10-CM | POA: Diagnosis present

## 2022-02-11 DIAGNOSIS — E1169 Type 2 diabetes mellitus with other specified complication: Secondary | ICD-10-CM

## 2022-02-11 DIAGNOSIS — R262 Difficulty in walking, not elsewhere classified: Secondary | ICD-10-CM | POA: Diagnosis not present

## 2022-02-11 DIAGNOSIS — S72002D Fracture of unspecified part of neck of left femur, subsequent encounter for closed fracture with routine healing: Secondary | ICD-10-CM | POA: Diagnosis not present

## 2022-02-11 DIAGNOSIS — E440 Moderate protein-calorie malnutrition: Secondary | ICD-10-CM | POA: Insufficient documentation

## 2022-02-11 DIAGNOSIS — M109 Gout, unspecified: Secondary | ICD-10-CM | POA: Diagnosis present

## 2022-02-11 DIAGNOSIS — Z79899 Other long term (current) drug therapy: Secondary | ICD-10-CM

## 2022-02-11 DIAGNOSIS — Z7982 Long term (current) use of aspirin: Secondary | ICD-10-CM

## 2022-02-11 MED ORDER — HYDROCODONE-ACETAMINOPHEN 5-325 MG PO TABS
1.0000 | ORAL_TABLET | Freq: Four times a day (QID) | ORAL | Status: DC | PRN
  Administered 2022-02-12: 1 via ORAL
  Administered 2022-02-13 (×3): 2 via ORAL
  Administered 2022-02-17: 1 via ORAL
  Filled 2022-02-11: qty 1
  Filled 2022-02-11 (×2): qty 2
  Filled 2022-02-11: qty 1
  Filled 2022-02-11: qty 2

## 2022-02-11 MED ORDER — HYDROMORPHONE HCL 1 MG/ML IJ SOLN
0.5000 mg | INTRAMUSCULAR | Status: DC | PRN
  Administered 2022-02-16: 0.5 mg via INTRAVENOUS
  Filled 2022-02-11: qty 0.5

## 2022-02-11 NOTE — Progress Notes (Signed)
Plan of Care Note for accepted transfer ? ? ?Patient: Derek [name] MRN: [id number]   DOA: 02/11/2022 ? ?Facility requesting transfer: [place] ?Requesting Provider: Dr. Pattricia Boss ?Reason for transfer: Left Hip Fracture ?Facility course:  ? ?76 year old male with past medical history of Hypertension, PTSD, sleep apnea, diabetes mellitus type 2, remote history of upper gastrointestinal bleeding, coronary artery disease status post CABG who presented to [place] with a several week history of frequent falls and left hip pain. ? ?According to the ER provider patient is somewhat of a poor historian.  According to the patient and his spouse the patient has been experiencing frequent falls over the past several weeks and over the same span of time patient has been experiencing ongoing left hip pain and difficulty with ambulation.  It is unclear as to when the severe left hip pain began however due to progressively worsening symptoms the patient eventually presented to [place] for evaluation. ? ?Upon evaluation in the emergency department x-ray of the left hip revealed a deformity of the femoral head/neck junction.  It was unclear as to whether this was fractured and therefore this was followed up with MRI which revealed nondisplaced subcapital/transcervical left femoral neck fracture with mild angulation with associated marrow edema with a nondisplaced transverse sacral fracture at the level of S4 and S5 as well as a high-grade partial tear of the left gluteus minimus tendon. ? ?ER provider discussed case with Dr. Lucia Gaskins with orthopedic surgery who recommended hospitalization at [facility] and for patient to be n.p.o. after midnight with anticipation for surgical intervention. ? ?Plan of care: ?The patient is accepted for admission to Telemetry unit, at [facility]..  ? ? ?Author: ?Vernelle Emerald, MD ?02/11/2022 ? ?Check [email or web address] for on-call coverage. ? ?Nursing staff, Please call [facility] number on Amion as soon as patient's arrival, so appropriate admitting provider can evaluate the pt. ?

## 2022-02-11 NOTE — ED Provider Notes (Signed)
?[facility] EMERGENCY DEPT ?Provider Note ? ? ?CSN: [id number] ?Arrival date & time: 02/11/22  1155 ? ?  ? ?History ? ?Chief Complaint  ?Patient presents with  ? Foot Pain  ? ? ?Derek [name] is a 76 y.o. male. ? ?HPI ?76 year old male presents today with his wife.  Wife at gives additional history.  Patient has fallen several times over the past month.  Since initial falls he has had pain in his left lower extremity that worsens with any attempt to bear weight.  His wife has had to assist him with any movements and he has fallen despite her help.  She reports that these falls he is lighted into a sitting position.  Review of records from [place] show that he presented there on the 24th with a recent fall.  They noted that he was on Eliquis and obtained a head CT that did not show any acute abnormalities as well as a x-Kashon Kraynak of his left hip, pelvis, and left knee that did not show any acute fractures.  Wife has noted that he fell again on Sunday and has had ongoing and worsening pain.  She has not noted any change in mental status, cough, fever, chills, abdominal pain, nausea, vomiting, diarrhea, or change in appetite. ?  ? ?Home Medications ?Prior to Admission medications   ?Medication Sig Start Date End Date Taking? Authorizing Provider  ?allopurinol (ZYLOPRIM) 300 MG tablet Take 300 mg by mouth daily.    [provider]  ?cholecalciferol (VITAMIN D) 1000 UNITS tablet Take 1,000 Units by mouth daily.    [provider]  ?folic acid (FOLVITE) 1 MG tablet Take 1 mg by mouth daily.    [provider]  ?furosemide (LASIX) 20 MG tablet Take 20 mg by mouth daily.    [provider]  ?glimepiride (AMARYL) 1 MG tablet Take 0.5 mg by mouth 2 (two) times daily.     [provider]  ?hydroxychloroquine (PLAQUENIL) 200 MG tablet Take 200-400 mg by mouth 2 (two) times daily. Take 2 tabs in the morning and 1 tab in the evening    [provider]  ?lisinopril  (PRINIVIL,ZESTRIL) 10 MG tablet Take 5 mg by mouth 2 (two) times daily.     [provider]  ?metFORMIN (GLUCOPHAGE) 1000 MG tablet Take 750 mg by mouth 2 (two) times daily with a meal.     [provider]  ?methotrexate (RHEUMATREX) 2.5 MG tablet Take 12.5 mg by mouth 2 (two) times a week. Caution:Chemotherapy. Protect from light.; takes 5 tablets (12.'5mg'$  total) on Fridays and Saturdays at 5 PM    [provider]  ?Omega-3 Fatty Acids (FISH OIL) 300 MG CAPS Take 300 mg by mouth daily.    [provider]  ?prazosin (MINIPRESS) 2 MG capsule Take 1 mg by mouth at bedtime.     [provider]  ?predniSONE (DELTASONE) 5 MG tablet Take 5 mg by mouth daily.    [provider]  ?traZODone (DESYREL) 50 MG tablet Take 50 mg by mouth at bedtime.     [provider]  ?   ? ?Allergies    ?Patient has no known allergies.   ? ?Review of Systems   ?Review of Systems  ?All other systems reviewed and are negative. ? ?Physical Exam ?Updated Vital Signs ?BP 128/88   Pulse 70   Temp 98.6 ?F (37 ?C)   Resp 17   Ht 1.702 m ('5\' 7"'$ )   Wt 75.8 kg  SpO2 99%   BMI 26.16 kg/m?  ?Physical Exam ?Vitals and nursing note reviewed.  ?Constitutional:   ?   Appearance: Normal appearance.  ?HENT:  ?   Head: Normocephalic.  ?   Right Ear: External ear normal.  ?   Left Ear: External ear normal.  ?   Nose: Nose normal.  ?   Mouth/Throat:  ?   Pharynx: Oropharynx is clear.  ?Eyes:  ?   Pupils: Pupils are equal, round, and reactive to light.  ?Cardiovascular:  ?   Rate and Rhythm: Normal rate and regular rhythm.  ?   Pulses: Normal pulses.  ?Pulmonary:  ?   Effort: Pulmonary effort is normal.  ?   Breath sounds: Normal breath sounds.  ?Abdominal:  ?   General: Abdomen is flat.  ?Musculoskeletal:  ?   Cervical back: Normal range of motion.  ?   Comments: Back without any signs of trauma without point tenderness ?Left hip without tenderness ?There is tenderness in the left pelvic area in  the general area of the pubic ramus ?No tenderness noted over the left knee ?No tenderness or deformity noted over the left ankle or foot ?Pulses are intact ?There is no warmth throughout the lower extremity ?Patient has pain when attempting to internally rotate the left hip  ?Skin: ?   General: Skin is warm and dry.  ?   Capillary Refill: Capillary refill takes less than 2 seconds.  ?Neurological:  ?   General: No focal deficit present.  ?   Mental Status: He is alert.  ?Psychiatric:     ?   Mood and Affect: Mood normal.  ? ? ?ED Results / Procedures / Treatments   ?Labs ?(all labs ordered are listed, but only abnormal results are displayed) ?Labs Reviewed - No data to display ? ?EKG ?None ? ?Radiology ?MR PELVIS WO CONTRAST ? ?Result Date: 02/11/2022 ?CLINICAL DATA:  Left hip pain EXAM: MR OF THE LEFT HIP WITHOUT CONTRAST TECHNIQUE: Multiplanar, multisequence MR imaging was performed. No intravenous contrast was administered. COMPARISON:  None Available. FINDINGS: MRI PELVIS: Bones/Joint/Cartilage Nondisplaced transverse fracture of lower sacrum at the level of S4 and S5 (coronal T1 image 3, sagittal T2 image 33). There is a subcapital/transcervical left femoral neck fracture with intense associated marrow edema. There is degenerative disc disease at L5-S1, partially visualized, with degenerative endplate edema. There are mild degenerative changes of the sacroiliac joints bilaterally. 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 Ligaments Grossly intact. Muscles and Tendons There is mild intramuscular edema of the left gluteus minimus. There is generalized mild atrophy. Soft tissue There is lower parasacral soft tissue edema. No focal fluid collection. MRI LEFT HIP: Bones: There is a subcapital/transcervical left femoral neck fracture with mild angulation. There is intense associated marrow edema. Right hip arthroplasty with associated  susceptibility artifact obscuring the adjacent bone and soft tissues. There is a lower sacral fracture as described above. Articular cartilage and labrum Articular cartilage:  Moderate chondrosis. Labrum:  Degenerative anterior superior labral tearing. Joint or bursal effusion Joint effusion:  Small joint effusion. Bursae: No evidence of trochanteric bursitis. Muscles and tendons Muscles and tendons: There is a high-grade partial tear of the gluteus minimus at the greater trochanter and vastus lateralis origin (axial stir image 18). The gluteus medius tendon is intact. There is mild reactive edema in the gluteus minimus muscle belly. Other findings Miscellaneous:   No additional findings. IMPRESSION: Nondisplaced subcapital/transcervical  left femoral neck fracture with mild angulation and intense associated marrow edema. Nondisplaced transverse sacral fracture at the level of S4 and S5. High-grade partial tear of the left gluteus minimus tendon at the greater trochanter and involving the vastus lateralis origin. Electronically Signed   By: Maurine Simmering M.D.   On: 02/11/2022 19:00  ? ?MR HIP LEFT WO CONTRAST ? ?Result Date: 02/11/2022 ?CLINICAL DATA:  Left hip pain EXAM: MR OF THE LEFT HIP WITHOUT CONTRAST TECHNIQUE: Multiplanar, multisequence MR imaging was performed. No intravenous contrast was administered. COMPARISON:  None Available. FINDINGS: MRI PELVIS: Bones/Joint/Cartilage Nondisplaced transverse fracture of lower sacrum at the level of S4 and S5 (coronal T1 image 3, sagittal T2 image 33). There is a subcapital/transcervical left femoral neck fracture with intense associated marrow edema. There is degenerative disc disease at L5-S1, partially visualized, with degenerative endplate edema. There are mild degenerative changes of the sacroiliac joints bilaterally. 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 Ligaments Grossly intact.  Muscles and Tendons There is mild intramuscular edema of the left gluteus minimus. There is generalized mild atrophy. Soft tissue There is lower parasacral soft tissue edema. No focal fluid collection. MRI LEFT HIP: Lesly Rubenstein

## 2022-02-11 NOTE — ED Triage Notes (Addendum)
Pt arrives to ED via [facility] with c/o left sided foot pain. He reports pain when he puts pressure on the foot or walks. He was seen at [facility] for same this week with negative exam and had plain xrays completed. He reports the pain has worsened this week and has lead to some falls at home. Pedal pulses 2+ with no discoloration in either foot. No known injury to the foot.  ?

## 2022-02-11 NOTE — ED Notes (Signed)
Called to give report to [facility] no answer.  ? ?

## 2022-02-11 NOTE — H&P (Signed)
?History and Physical  ? ?Derek [name] GUR:[id number] DOB: 1946/05/12 DOA: 02/11/2022 ? ?PCP: Lavone Orn, MD  ? ?Patient coming from: Home ? ?Chief Complaint: Status, leg pain ? ?HPI: Derek [name] is a 76 y.o. male with medical history significant of CAD status post CABG, diabetes, hypertension, PTSD, depression, insomnia, OSA on CPAP, gout, glaucoma, mild cognitive impairment, rheumatoid arthritis presenting after multiple falls and subsequent pain and difficulty walking. ? ?History obtained with assistance of family Patient has had ongoing falls for the past month.  Has had pain in his left lower extremity with difficulty bearing weight and difficulty walking secondary to these falls.  Was evaluated at [facility] recently with negative fracture work-up at that time.  Had another fall on Sunday with increased pain and difficulty walking as well. ? ?ED Course: Vital signs in the ED stable.  Lab work-up not yet performed.  Imaging work-up showed subtle lucency on the left hip x-ray with recommendation for further imaging.  MRI of the pelvis and left hip showed a nondisplaced subcapital/transcervical left femoral neck fracture.  Orthopedics consulted and will see the patient recommend n.p.o. at midnight. ? ?Review of Systems: As per HPI otherwise all other systems reviewed and are negative. ? ?Past Medical History:  ?Diagnosis Date  ? Arthritis   ? Coronary artery disease   ? Diabetes mellitus   ? type 2  ? DJD (degenerative joint disease)   ? R hip DJD  ? H/O: upper GI bleed 01/2001   ? related to nonsteroidal use, CLO test +(Med Tx- Prilosec, Amoxicillin, & Biaxin)  ? Hypertension   ? Insomnia   ? Mental disorder   ? PTSD  ? Peptic ulcer disease   ? history of gastric ulcer  ? PTSD (post-traumatic stress disorder)   ? [facility]  ? Sarcoidosis   ? late 1970s  ? Scleritis   ? Sleep apnea   ? cpap with O2 4L for 4 yrs  ? Tubular adenoma 2004  ? ? ?Past Surgical History:  ?Procedure Laterality Date  ? CARDIAC  CATHETERIZATION    ? CORONARY ARTERY BYPASS GRAFT  12/02/2017  ? at [facility] Dr Lunette Stands Surgeon  ? EYE SURGERY  05  ? growth lft  ? TOTAL HIP ARTHROPLASTY  06/13/2012  ? TOTAL HIP ARTHROPLASTY  06/13/2012  ? Procedure: TOTAL HIP ARTHROPLASTY ANTERIOR APPROACH;  Surgeon: Hessie Dibble, MD;  Location: [place];  Service: Orthopedics;  Laterality: Right;  right anterior approach total hip arthroplasty  ? ? ?Social History ? reports that he has never smoked. He has never used smokeless tobacco. He reports current alcohol use. He reports that he does not use drugs. ? ?No Known Allergies ? ?Family History  ?Problem Relation Age of Onset  ? Stroke Mother   ? Hypertension Mother   ? Hypertension Father   ? Hypertension Brother   ? Gout Brother   ? Deep vein thrombosis Brother   ?Reviewed on admission ? ?Prior to Admission medications   ?Medication Sig Start Date End Date Taking? Authorizing Provider  ?allopurinol (ZYLOPRIM) 300 MG tablet Take 300 mg by mouth daily.    [provider]  ?cholecalciferol (VITAMIN D) 1000 UNITS tablet Take 1,000 Units by mouth daily.    [provider]  ?folic acid (FOLVITE) 1 MG tablet Take 1 mg by mouth daily.    [provider]  ?furosemide (LASIX) 20 MG tablet Take 20 mg by mouth daily.    [provider]  ?glimepiride (AMARYL)  1 MG tablet Take 0.5 mg by mouth 2 (two) times daily.     [provider]  ?hydroxychloroquine (PLAQUENIL) 200 MG tablet Take 200-400 mg by mouth 2 (two) times daily. Take 2 tabs in the morning and 1 tab in the evening    [provider]  ?lisinopril (PRINIVIL,ZESTRIL) 10 MG tablet Take 5 mg by mouth 2 (two) times daily.     [provider]  ?metFORMIN (GLUCOPHAGE) 1000 MG tablet Take 750 mg by mouth 2 (two) times daily with a meal.     [provider]  ?methotrexate (RHEUMATREX) 2.5 MG tablet Take 12.5 mg by mouth 2 (two) times a week. Caution:Chemotherapy. Protect from light.; takes 5 tablets (12.'5mg'$   total) on Fridays and Saturdays at 5 PM    [provider]  ?Omega-3 Fatty Acids (FISH OIL) 300 MG CAPS Take 300 mg by mouth daily.    [provider]  ?prazosin (MINIPRESS) 2 MG capsule Take 1 mg by mouth at bedtime.     [provider]  ?predniSONE (DELTASONE) 5 MG tablet Take 5 mg by mouth daily.    [provider]  ?traZODone (DESYREL) 50 MG tablet Take 50 mg by mouth at bedtime.     [provider]  ? ? ?Physical Exam: ?Vitals:  ? 02/11/22 1941 02/11/22 2108 02/11/22 2234 02/11/22 2312  ?BP: 128/88 136/86 (!) 140/95 137/90  ?Pulse: 70  83 72  ?Resp: '17 17 18 16  '$ ?Temp:   98.8 ?F (37.1 ?C) 100 ?F (37.8 ?C)  ?TempSrc:   Oral Oral  ?SpO2: 99% 99% 97% 96%  ?Weight:      ?Height:      ? ? ?Physical Exam ?Constitutional:   ?   General: He is not in acute distress. ?   Appearance: Normal appearance.  ?HENT:  ?   Head: Normocephalic and atraumatic.  ?   Mouth/Throat:  ?   Mouth: Mucous membranes are moist.  ?   Pharynx: Oropharynx is clear.  ?Eyes:  ?   Extraocular Movements: Extraocular movements intact.  ?   Pupils: Pupils are equal, round, and reactive to light.  ?Cardiovascular:  ?   Rate and Rhythm: Normal rate and regular rhythm.  ?   Pulses: Normal pulses.  ?   Heart sounds: Normal heart sounds.  ?Pulmonary:  ?   Effort: Pulmonary effort is normal. No respiratory distress.  ?   Breath sounds: Normal breath sounds.  ?Abdominal:  ?   General: Bowel sounds are normal. There is no distension.  ?   Palpations: Abdomen is soft.  ?   Tenderness: There is no abdominal tenderness.  ?Musculoskeletal:     ?   General: No swelling or deformity.  ?   Comments: Bilateral lower extremities are neurovascularly intact.  ?Skin: ?   General: Skin is warm and dry.  ?Neurological:  ?   General: No focal deficit present.  ?   Mental Status: Mental status is at baseline.  ? ?Labs on Admission: I have personally reviewed following labs and imaging studies ? ?CBC: ?No results for input(s):  WBC, NEUTROABS, HGB, HCT, MCV, PLT in the last 168 hours. ? ?Basic Metabolic Panel: ?No results for input(s): NA, K, CL, CO2, GLUCOSE, BUN, CREATININE, CALCIUM, MG, PHOS in the last 168 hours. ? ?GFR: ?CrCl cannot be calculated (Patient's most recent lab result is older than the maximum 21 days allowed.). ? ?Liver Function Tests: ?No results for input(s): AST, ALT, ALKPHOS, BILITOT, PROT, ALBUMIN  in the last 168 hours. ? ?Urine analysis: ?   ?Component Value Date/Time  ? [place] YELLOW 06/06/2012 1037  ? APPEARANCEUR CLEAR 06/06/2012 1037  ? LABSPEC 1.023 06/06/2012 1037  ? PHURINE 5.0 06/06/2012 1037  ? GLUCOSEU NEGATIVE 06/06/2012 1037  ? [place] NEGATIVE 06/06/2012 1037  ? [place] NEGATIVE 06/06/2012 1037  ? [place] NEGATIVE 06/06/2012 1037  ? [place] NEGATIVE 06/06/2012 1037  ? UROBILINOGEN 1.0 06/06/2012 1037  ? NITRITE NEGATIVE 06/06/2012 1037  ? LEUKOCYTESUR NEGATIVE 06/06/2012 1037  ? ? ?Radiological Exams on Admission: ?MR PELVIS WO CONTRAST ? ?Result Date: 02/11/2022 ?CLINICAL DATA:  Left hip pain EXAM: MR OF THE LEFT HIP WITHOUT CONTRAST TECHNIQUE: Multiplanar, multisequence MR imaging was performed. No intravenous contrast was administered. COMPARISON:  None Available. FINDINGS: MRI PELVIS: Bones/Joint/Cartilage Nondisplaced transverse fracture of lower sacrum at the level of S4 and S5 (coronal T1 image 3, sagittal T2 image 33). There is a subcapital/transcervical left femoral neck fracture with intense associated marrow edema. There is degenerative disc disease at L5-S1, partially visualized, with degenerative endplate edema. There are mild degenerative changes of the sacroiliac joints bilaterally. 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 Ligaments Grossly intact. Muscles and Tendons There is mild intramuscular edema of the left gluteus minimus. There is generalized mild atrophy. Soft tissue There is lower parasacral  soft tissue edema. No focal fluid collection. MRI LEFT HIP: Bones: There is a subcapital/transcervical left femoral neck fracture with mild angulation. There is intense associated marrow edema. Right hip arthr

## 2022-02-11 NOTE — ED Notes (Signed)
Called [facility] to transport patient to [facility] ?

## 2022-02-12 ENCOUNTER — Inpatient Hospital Stay (HOSPITAL_COMMUNITY): Payer: No Typology Code available for payment source | Admitting: Certified Registered"

## 2022-02-12 ENCOUNTER — Inpatient Hospital Stay (HOSPITAL_COMMUNITY): Payer: No Typology Code available for payment source

## 2022-02-12 ENCOUNTER — Encounter (HOSPITAL_COMMUNITY)
Admission: EM | Disposition: A | Discharge status: Skilled Nursing Facility | Payer: Self-pay | Source: Home / Self Care | Attending: Internal Medicine

## 2022-02-12 ENCOUNTER — Encounter (HOSPITAL_COMMUNITY): Payer: Self-pay | Admitting: Internal Medicine

## 2022-02-12 DIAGNOSIS — S72002A Fracture of unspecified part of neck of left femur, initial encounter for closed fracture: Secondary | ICD-10-CM

## 2022-02-12 DIAGNOSIS — I2510 Atherosclerotic heart disease of native coronary artery without angina pectoris: Secondary | ICD-10-CM

## 2022-02-12 DIAGNOSIS — G4733 Obstructive sleep apnea (adult) (pediatric): Secondary | ICD-10-CM

## 2022-02-12 DIAGNOSIS — S3210XA Unspecified fracture of sacrum, initial encounter for closed fracture: Secondary | ICD-10-CM

## 2022-02-12 DIAGNOSIS — I10 Essential (primary) hypertension: Secondary | ICD-10-CM

## 2022-02-12 HISTORY — PX: HIP PINNING,CANNULATED: SHX1758

## 2022-02-12 LAB — GLUCOSE, CAPILLARY
Glucose-Capillary: 107 mg/dL — ABNORMAL HIGH (ref 70–99)
Glucose-Capillary: 111 mg/dL — ABNORMAL HIGH (ref 70–99)
Glucose-Capillary: 79 mg/dL (ref 70–99)
Glucose-Capillary: 79 mg/dL (ref 70–99)
Glucose-Capillary: 90 mg/dL (ref 70–99)
Glucose-Capillary: 92 mg/dL (ref 70–99)
Glucose-Capillary: 93 mg/dL (ref 70–99)
Glucose-Capillary: 96 mg/dL (ref 70–99)

## 2022-02-12 LAB — BASIC METABOLIC PANEL
Anion gap: 10 (ref 5–15)
Anion gap: 12 (ref 5–15)
BUN: 20 mg/dL (ref 8–23)
BUN: 20 mg/dL (ref 8–23)
CO2: 22 mmol/L (ref 22–32)
CO2: 24 mmol/L (ref 22–32)
Calcium: 10 mg/dL (ref 8.9–10.3)
Calcium: 10.1 mg/dL (ref 8.9–10.3)
Chloride: 104 mmol/L (ref 98–111)
Chloride: 107 mmol/L (ref 98–111)
Creatinine, Ser: 0.89 mg/dL (ref 0.61–1.24)
Creatinine, Ser: 0.95 mg/dL (ref 0.61–1.24)
GFR, Estimated: >60 mL/min (ref >60)
GFR, Estimated: >60 mL/min (ref >60)
Glucose, Bld: 112 mg/dL — ABNORMAL HIGH (ref 70–99)
Glucose, Bld: 94 mg/dL (ref 70–99)
Potassium: 3.7 mmol/L (ref 3.5–5.1)
Potassium: 3.9 mmol/L (ref 3.5–5.1)
Sodium: 139 mmol/L (ref 135–145)
Sodium: 140 mmol/L (ref 135–145)

## 2022-02-12 LAB — SURGICAL PCR SCREEN
MRSA, PCR: NEGATIVE
Staphylococcus aureus: NEGATIVE

## 2022-02-12 LAB — CBC
HCT: 48.5 % (ref 39.0–52.0)
HCT: 50.7 % (ref 39.0–52.0)
Hemoglobin: 16.4 g/dL (ref 13.0–17.0)
Hemoglobin: 16.8 g/dL (ref 13.0–17.0)
MCH: 33.1 pg (ref 26.0–34.0)
MCH: 33.7 pg (ref 26.0–34.0)
MCHC: 33.1 g/dL (ref 30.0–36.0)
MCHC: 33.8 g/dL (ref 30.0–36.0)
MCV: 100 fL (ref 80.0–100.0)
MCV: 99.6 fL (ref 80.0–100.0)
Platelets: 302 10*3/uL (ref 150–400)
Platelets: 313 10*3/uL (ref 150–400)
RBC: 4.87 MIL/uL (ref 4.22–5.81)
RBC: 5.07 MIL/uL (ref 4.22–5.81)
RDW: 13.3 % (ref 11.5–15.5)
RDW: 13.5 % (ref 11.5–15.5)
WBC: 6.5 10*3/uL (ref 4.0–10.5)
WBC: 7 10*3/uL (ref 4.0–10.5)
nRBC: 0 % (ref 0.0–0.2)
nRBC: 0.3 % — ABNORMAL HIGH (ref 0.0–0.2)

## 2022-02-12 LAB — HEMOGLOBIN A1C
Hgb A1c MFr Bld: 5.4 % (ref 4.8–5.6)
Mean Plasma Glucose: 108.28 mg/dL

## 2022-02-12 LAB — TYPE AND SCREEN
ABO/RH(D): O POS
Antibody Screen: NEGATIVE

## 2022-02-12 SURGERY — FIXATION, FEMUR, NECK, PERCUTANEOUS, USING SCREW
Anesthesia: General | Site: Hip | Laterality: Left

## 2022-02-12 MED ORDER — PHENYLEPHRINE HCL (PRESSORS) 10 MG/ML IV SOLN
INTRAVENOUS | Status: AC
  Filled 2022-02-12: qty 1

## 2022-02-12 MED ORDER — PHENYLEPHRINE HCL-NACL 20-0.9 MG/250ML-% IV SOLN
INTRAVENOUS | Status: DC | PRN
  Administered 2022-02-12: 60 ug/min via INTRAVENOUS

## 2022-02-12 MED ORDER — ENOXAPARIN SODIUM 40 MG/0.4ML IJ SOSY
40.0000 mg | PREFILLED_SYRINGE | INTRAMUSCULAR | Status: DC
  Administered 2022-02-13 – 2022-02-17 (×5): 40 mg via SUBCUTANEOUS
  Filled 2022-02-12 (×5): qty 0.4

## 2022-02-12 MED ORDER — CHLORHEXIDINE GLUCONATE CLOTH 2 % EX PADS: MEDICATED_PAD | Freq: Once | CUTANEOUS | Status: AC

## 2022-02-12 MED ORDER — OXYCODONE HCL 5 MG PO TABS: 5.0000 mg | ORAL_TABLET | Freq: Once | ORAL | Status: DC | PRN

## 2022-02-12 MED ORDER — CEFAZOLIN SODIUM-DEXTROSE 1-4 GM/50ML-% IV SOLN
1.0000 g | Freq: Four times a day (QID) | INTRAVENOUS | Status: AC
  Administered 2022-02-12 – 2022-02-13 (×3): 1 g via INTRAVENOUS
  Filled 2022-02-12 (×3): qty 50

## 2022-02-12 MED ORDER — POVIDONE-IODINE 10 % EX SWAB
2.0000 "application " | Freq: Once | CUTANEOUS | Status: AC
  Administered 2022-02-12: 2 via TOPICAL

## 2022-02-12 MED ORDER — ACETAMINOPHEN 500 MG PO TABS
1000.0000 mg | ORAL_TABLET | Freq: Once | ORAL | Status: AC
  Administered 2022-02-12: 1000 mg via ORAL
  Filled 2022-02-12: qty 2

## 2022-02-12 MED ORDER — DOCUSATE SODIUM 100 MG PO CAPS
100.0000 mg | ORAL_CAPSULE | Freq: Two times a day (BID) | ORAL | Status: DC
  Administered 2022-02-12 – 2022-02-17 (×10): 100 mg via ORAL
  Filled 2022-02-12 (×10): qty 1

## 2022-02-12 MED ORDER — PHENYLEPHRINE 80 MCG/ML (10ML) SYRINGE FOR IV PUSH (FOR BLOOD PRESSURE SUPPORT)
PREFILLED_SYRINGE | INTRAVENOUS | Status: DC | PRN
  Administered 2022-02-12: 80 ug via INTRAVENOUS
  Administered 2022-02-12: 160 ug via INTRAVENOUS

## 2022-02-12 MED ORDER — FENTANYL CITRATE (PF) 100 MCG/2ML IJ SOLN
INTRAMUSCULAR | Status: AC
  Filled 2022-02-12: qty 2

## 2022-02-12 MED ORDER — METOCLOPRAMIDE HCL 5 MG/ML IJ SOLN: 5.0000 mg | Freq: Three times a day (TID) | INTRAMUSCULAR | Status: DC | PRN

## 2022-02-12 MED ORDER — INSULIN ASPART 100 UNIT/ML IJ SOLN
0.0000 [IU] | INTRAMUSCULAR | Status: DC
  Administered 2022-02-13: 2 [IU] via SUBCUTANEOUS

## 2022-02-12 MED ORDER — ONDANSETRON HCL 4 MG/2ML IJ SOLN: 4.0000 mg | Freq: Once | INTRAMUSCULAR | Status: DC | PRN

## 2022-02-12 MED ORDER — LACTATED RINGERS IV SOLN: INTRAVENOUS | Status: DC

## 2022-02-12 MED ORDER — OXYCODONE HCL 5 MG/5ML PO SOLN: 5.0000 mg | Freq: Once | ORAL | Status: DC | PRN

## 2022-02-12 MED ORDER — SODIUM CHLORIDE 0.9 % IR SOLN
Status: DC | PRN
  Administered 2022-02-12: 1000 mL

## 2022-02-12 MED ORDER — EPHEDRINE SULFATE-NACL 50-0.9 MG/10ML-% IV SOSY
PREFILLED_SYRINGE | INTRAVENOUS | Status: DC | PRN
  Administered 2022-02-12: 5 mg via INTRAVENOUS

## 2022-02-12 MED ORDER — LIDOCAINE 2% (20 MG/ML) 5 ML SYRINGE
INTRAMUSCULAR | Status: DC | PRN
  Administered 2022-02-12: 60 mg via INTRAVENOUS

## 2022-02-12 MED ORDER — PROPOFOL 10 MG/ML IV BOLUS
INTRAVENOUS | Status: AC
Start: 2022-02-12 — End: ?
  Filled 2022-02-12: qty 20

## 2022-02-12 MED ORDER — ONDANSETRON HCL 4 MG/2ML IJ SOLN
4.0000 mg | Freq: Four times a day (QID) | INTRAMUSCULAR | Status: DC | PRN
  Administered 2022-02-15: 4 mg via INTRAVENOUS
  Filled 2022-02-12 (×2): qty 2

## 2022-02-12 MED ORDER — FENTANYL CITRATE PF 50 MCG/ML IJ SOSY: 25.0000 ug | PREFILLED_SYRINGE | INTRAMUSCULAR | Status: DC | PRN

## 2022-02-12 MED ORDER — METHOTREXATE 2.5 MG PO TABS
10.0000 mg | ORAL_TABLET | ORAL | Status: DC
  Administered 2022-02-12: 10 mg via ORAL
  Filled 2022-02-12: qty 4

## 2022-02-12 MED ORDER — CHLORHEXIDINE GLUCONATE 0.12 % MT SOLN
15.0000 mL | Freq: Once | OROMUCOSAL | Status: AC
  Administered 2022-02-12: 15 mL via OROMUCOSAL

## 2022-02-12 MED ORDER — LIDOCAINE HCL (PF) 2 % IJ SOLN
INTRAMUSCULAR | Status: AC
  Filled 2022-02-12: qty 5

## 2022-02-12 MED ORDER — CEFAZOLIN SODIUM-DEXTROSE 2-4 GM/100ML-% IV SOLN
2.0000 g | INTRAVENOUS | Status: AC
  Administered 2022-02-12: 2 g via INTRAVENOUS
  Filled 2022-02-12: qty 100

## 2022-02-12 MED ORDER — ONDANSETRON HCL 4 MG PO TABS: 4.0000 mg | ORAL_TABLET | Freq: Four times a day (QID) | ORAL | Status: DC | PRN

## 2022-02-12 MED ORDER — METOCLOPRAMIDE HCL 5 MG PO TABS: 5.0000 mg | ORAL_TABLET | Freq: Three times a day (TID) | ORAL | Status: DC | PRN

## 2022-02-12 MED ORDER — ROSUVASTATIN CALCIUM 10 MG PO TABS
20.0000 mg | ORAL_TABLET | Freq: Every morning | ORAL | Status: DC
  Administered 2022-02-13 – 2022-02-17 (×5): 20 mg via ORAL
  Filled 2022-02-12 (×5): qty 2

## 2022-02-12 MED ORDER — ONDANSETRON HCL 4 MG/2ML IJ SOLN
INTRAMUSCULAR | Status: DC | PRN
  Administered 2022-02-12: 4 mg via INTRAVENOUS

## 2022-02-12 MED ORDER — FENTANYL CITRATE (PF) 100 MCG/2ML IJ SOLN
INTRAMUSCULAR | Status: DC | PRN
  Administered 2022-02-12 (×2): 25 ug via INTRAVENOUS
  Administered 2022-02-12: 50 ug via INTRAVENOUS

## 2022-02-12 MED ORDER — FLUOXETINE HCL 20 MG PO CAPS
20.0000 mg | ORAL_CAPSULE | Freq: Every morning | ORAL | Status: DC
  Administered 2022-02-13 – 2022-02-17 (×5): 20 mg via ORAL
  Filled 2022-02-12 (×5): qty 1

## 2022-02-12 MED ORDER — PROPOFOL 10 MG/ML IV BOLUS
INTRAVENOUS | Status: DC | PRN
  Administered 2022-02-12: 120 mg via INTRAVENOUS

## 2022-02-12 MED ORDER — METOPROLOL SUCCINATE ER 25 MG PO TB24
12.5000 mg | ORAL_TABLET | Freq: Every morning | ORAL | Status: DC
  Administered 2022-02-13 – 2022-02-16 (×4): 12.5 mg via ORAL
  Filled 2022-02-12 (×4): qty 1

## 2022-02-12 MED ORDER — METHOTREXATE 2.5 MG PO TABS
10.0000 mg | ORAL_TABLET | ORAL | Status: DC
  Administered 2022-02-13: 10 mg via ORAL
  Filled 2022-02-12: qty 4

## 2022-02-12 SURGICAL SUPPLY — 44 items
3.2 x 12 Threaded tip pin (Pin) ×1 IMPLANT
ADH SKN CLS APL DERMABOND .7 (GAUZE/BANDAGES/DRESSINGS) ×1
APL PRP STRL LF DISP 70% ISPRP (MISCELLANEOUS) ×1
BAG COUNTER SPONGE SURGICOUNT (BAG) IMPLANT
BAG SPEC THK2 15X12 ZIP CLS (MISCELLANEOUS)
BAG SPNG CNTER NS LX DISP (BAG)
BAG ZIPLOCK 12X15 (MISCELLANEOUS) IMPLANT
BIT DRILL CANNULATED 5.0 (BIT) ×1 IMPLANT
BLADE SAW SGTL 11.0X1.19X90.0M (BLADE) IMPLANT
BNDG GAUZE ELAST 4 BULKY (GAUZE/BANDAGES/DRESSINGS) ×2 IMPLANT
BOOTIES KNEE HIGH SLOAN (MISCELLANEOUS) ×2 IMPLANT
CHLORAPREP W/TINT 26 (MISCELLANEOUS) ×2 IMPLANT
COVER PERINEAL POST (MISCELLANEOUS) ×2 IMPLANT
COVER SURGICAL LIGHT HANDLE (MISCELLANEOUS) ×2 IMPLANT
DERMABOND ADVANCED (GAUZE/BANDAGES/DRESSINGS) ×1
DERMABOND ADVANCED .7 DNX12 (GAUZE/BANDAGES/DRESSINGS) ×1 IMPLANT
DRAPE C-ARMOR (DRAPES) ×2 IMPLANT
DRAPE STERI IOBAN 125X83 (DRAPES) ×2 IMPLANT
DRAPE U-SHAPE 47X51 STRL (DRAPES) ×4 IMPLANT
DRSG MEPILEX BORDER 4X8 (GAUZE/BANDAGES/DRESSINGS) ×1 IMPLANT
DRSG OPSITE POSTOP 4X6 (GAUZE/BANDAGES/DRESSINGS) ×4 IMPLANT
ELECT REM PT RETURN 15FT ADLT (MISCELLANEOUS) ×2 IMPLANT
GAUZE SPONGE 4X4 12PLY STRL (GAUZE/BANDAGES/DRESSINGS) ×2 IMPLANT
GLOVE BIOGEL PI IND STRL 8 (GLOVE) ×1 IMPLANT
GLOVE BIOGEL PI INDICATOR 8 (GLOVE) ×1
GLOVE SURG LX 7.5 STRW (GLOVE)
GLOVE SURG LX STRL 7.5 STRW (GLOVE) IMPLANT
KIT BASIN OR (CUSTOM PROCEDURE TRAY) ×2 IMPLANT
KIT TURNOVER KIT A (KITS) IMPLANT
MANIFOLD NEPTUNE II (INSTRUMENTS) ×2 IMPLANT
NS IRRIG 1000ML POUR BTL (IV SOLUTION) ×2 IMPLANT
PACK GENERAL/GYN (CUSTOM PROCEDURE TRAY) ×2 IMPLANT
PIN THD TIP GUIDE 3.2X12 (PIN) ×1 IMPLANT
PROTECTOR NERVE ULNAR (MISCELLANEOUS) ×2 IMPLANT
SCREW CANN 8.2X7.5X95MM (Screw) ×1 IMPLANT
SCREW CANN THRD SD 7.5X16 (Screw) ×1 IMPLANT
SCREW CANNULATED 7.5X110 (Screw) ×1 IMPLANT
STAPLER VISISTAT (STAPLE) ×2 IMPLANT
SUT VIC AB 2-0 CT1 27 (SUTURE)
SUT VIC AB 2-0 CT1 TAPERPNT 27 (SUTURE) IMPLANT
SUT VIC AB 3-0 PS2 18 (SUTURE)
SUT VIC AB 3-0 PS2 18XBRD (SUTURE) IMPLANT
TOWEL OR 17X26 10 PK STRL BLUE (TOWEL DISPOSABLE) ×2 IMPLANT
WASHER 5.5MM STAINLESS STEEL (Washer) ×1 IMPLANT

## 2022-02-12 NOTE — Progress Notes (Signed)
?      ? ? ? [facility] ?                                                                           ? ? ?Derek [name], is a 76 y.o. male, DOB - 02/02/1946, NFA:[id number] ?Admit date - 02/11/2022    ?Outpatient Primary MD for the patient is Lavone Orn, MD ? ?LOS - 1  days ? ?Chief Complaint  ?Patient presents with  ? Foot Pain  ?    ? ?Brief summary  ? ?Patient is a 76 year old male with history of CAD status post CABG, diabetes, hypertension, PTSD/depression, OSA on CPAP, gout, glaucoma, RA, mild cognitive impairment presented after multiple falls, subsequent pain and difficulty walking. ?Patient has had ongoing falls for the past month, had pain in his left lower extremity with difficulty bearing weight and difficulty walking secondary to these falls. ?Patient had another fall on Sunday, 02/07/2022 with increased pain and difficulty walking. ?X-ray showed a subtle lucency on the left hip x-ray, recommended further imaging ?MRI of the left hip and pelvis showed nondisplaced subcapital/transcervical left femoral neck fracture. ?Orthopedics consulted ? ? ?Assessment & Plan  ? ? ?Principal Problem: ?  Closed left hip fracture, mechanical fall ?-Orthopedics consulted, plan for surgical intervention today ?-Continue n.p.o. status,  ?-pain control, DVT prophylaxis per orthopedics ?-PT OT evaluation after surgery ? ?Active Problems: ?CAD status post CABG ?-Continue home aspirin, Crestor when taking p.o. ? ?  Depression, PTSD, insomnia ?-Continue home trazodone, fluoxetine ? ?Diabetes mellitus type 2 ?-Continue sliding scale insulin while inpatient ?-Hold metformin XR, Jardiance ?-Hemoglobin A1c 5.4  ? ? ?OSA ?-Continue home CPAP ? ?History of rheumatoid arthritis ?-On methotrexate, continue home prednisone when able to take p.o. ? ?Glaucoma ?-Continue eyedrops ? ? ?Code Status: Full CODE STATUS ?DVT Prophylaxis:  SCDs Start: 02/11/22 2331 ? ? ?Level of Care: Level of care: Telemetry ?Family Communication:  Updated patient's wife at the bedside ? ? ?Disposition Plan:      Remains inpatient appropriate: Plan for or today ? ? ?Procedures:  ? ? ?Consultants:   ?Ortho  ? ?Antimicrobials:  ? ?Anti-infectives (From admission, onward)  ? ? Start     Dose/Rate Route Frequency Ordered Stop  ? 02/12/22 1115  ceFAZolin (ANCEF) IVPB 2g/100 mL premix       ? 2 g ?200 mL/hr over 30 Minutes Intravenous On call to O.R. 02/12/22 1027 02/13/22 0559  ? ?  ? ? ? ?Medications ? [MAR Hold] insulin aspart  0-15 Units Subcutaneous Q4H  ? ? ? ? ?Subjective:  ? ?Derek [name] was seen and examined today.  No acute complaints.awaiting OR.  Patient denies dizziness, chest pain, shortness of breath, abdominal pain, N/V.  No acute events overnight.  Wife at the bedside. ? ?Objective:  ? ?Vitals:  ? 02/11/22 2312 02/12/22 0310 02/12/22 0743 02/12/22 1206  ?BP: 137/90 (!) 128/95 122/82 126/86  ?Pulse: 72 74 74   ?Resp: '16 15 14 15  '$ ?Temp: 100 ?F (37.8 ?C) 98.2 ?F (36.8 ?C) 98.9 ?F (37.2 ?C) 98.7 ?F (37.1 ?C)  ?TempSrc: Oral Oral Oral Oral  ?SpO2: 96% 99% 98% 95%  ?Weight:      ?Height:      ? ? ?  Intake/Output Summary (Last 24 hours) at 02/12/2022 1259 ?Last data filed at 02/12/2022 0545 ?Gross per 24 hour  ?Intake --  ?Output 350 ml  ?Net -350 ml  ? ? ? ?Wt Readings from Last 3 Encounters:  ?02/11/22 75.8 kg  ?06/16/18 82.7 kg  ?03/07/18 86.6 kg  ? ? ? ?Exam ?General: Alert and oriented x 3, NAD ?Cardiovascular: S1 S2 auscultated,  RRR ?Respiratory: Clear to auscultation bilaterally ?Gastrointestinal: Soft, nontender, nondistended, + bowel sounds ?Ext: no pedal edema bilaterally ?Neuro: Strength 5/5 upper and lower extremities bilaterally ?Psych: Normal affect and demeanor, alert and oriented x3  ? ? ? ?Data Reviewed:  I have personally reviewed following labs  ? ? ?CBC ?Lab Results  ?Component Value Date  ? WBC 6.5 02/12/2022  ? RBC 5.07 02/12/2022  ? HGB 16.8 02/12/2022  ? HCT 50.7 02/12/2022  ? MCV 100.0 02/12/2022  ? MCH 33.1 02/12/2022  ? PLT  302 02/12/2022  ? MCHC 33.1 02/12/2022  ? RDW 13.5 02/12/2022  ? LYMPHSABS 2.5 06/06/2012  ? MONOABS 0.6 06/06/2012  ? EOSABS 0.3 06/06/2012  ? BASOSABS 0.1 06/06/2012  ? ? ? ?Last metabolic panel ?Lab Results  ?Component Value Date  ? NA 140 02/12/2022  ? K 3.9 02/12/2022  ? CL 104 02/12/2022  ? CO2 24 02/12/2022  ? BUN 20 02/12/2022  ? CREATININE 0.89 02/12/2022  ? GLUCOSE 94 02/12/2022  ? GFRNONAA >60 02/12/2022  ? GFRAA >90 06/15/2012  ? CALCIUM 10.0 02/12/2022  ? ANIONGAP 12 02/12/2022  ? ? ?CBG (last 3)  ?Recent Labs  ?  02/12/22 ?0312 02/12/22 ?0736 02/12/22 ?1143  ?GLUCAP 93 92 90  ?  ? ? ?Coagulation Profile: ?No results for input(s): INR, PROTIME in the last 168 hours. ? ? ?Radiology Studies: I have personally reviewed the imaging studies  ?MR PELVIS WO CONTRAST ?IMPRESSION: Nondisplaced subcapital/transcervical left femoral neck fracture with mild angulation and intense associated marrow edema. Nondisplaced transverse sacral fracture at the level of S4 and S5. High-grade partial tear of the left gluteus minimus tendon at the greater trochanter and involving the vastus lateralis origin. Electronically Signed   By: Maurine Simmering M.D.   On: 02/11/2022 19:00  ? ?MR HIP LEFT WO CONTRAST ? ?Result Date: 02/11/2022 ? IMPRESSION: Nondisplaced subcapital/transcervical left femoral neck fracture with mild angulation and intense associated marrow edema. Nondisplaced transverse sacral fracture at the level of S4 and S5. High-grade partial tear of the left gluteus minimus tendon at the greater trochanter and involving the vastus lateralis origin. Electronically Signed   By: Maurine Simmering M.D.   On: 02/11/2022 19:00  ? ?DG Hip Unilat W or Wo Pelvis 2-3 Views Left ? ?Result Date: 02/11/2022 ? IMPRESSION: 1. There is subtle curvilinear lucency within the superolateral left femoral head-neck junction which may be related to osteoarthritis and a mild CAM-type bump deformity. However, is similar to exclude a nondisplaced acute to  subacute fracture. Recommend clinical correlation. Note is made that CT or MRI of the more sensitive for this indication. 2. Status post total right hip arthroplasty without evidence of hardware failure. Electronically Signed   By: Yvonne Kendall M.D.   On: 02/11/2022 16:24   ? ? ? ? ?Estill Cotta M.D. ?[facility] ?02/12/2022, 12:59 PM ? ?Available via Epic secure chat 7am-7pm ?After 7 pm, please refer to night coverage provider listed on amion. ? ? ? ?

## 2022-02-12 NOTE — Consult Note (Signed)
Reason for Consult: Derek [name] emergency department ?Referring Physician: Left hip fracture ? ?Derek [name] is an 76 y.o. male.  ?HPI: Patient is consulted on for evaluation and management of a left hip fracture.  Patient's wife is present in the room and provides much of the history.  He has had some disorientation and recurrent falls over the last several weeks.  Over the last week or so he had difficulty bearing weight on the left side and presented to the ED for further evaluation and management.  X-rays were taken and found to be suspicious for femoral neck fracture.  This was confirmed by MRI scan thereafter.  He was also found to have a sacral fracture that is nondisplaced.  At present, the patient ports his pain is well controlled.  He has remained nonweightbearing and has been n.p.o. after midnight.  He is noted to have type 2 diabetes controlled on oral agents, and the patient's wife reports A1c is well controlled.  He was previously ambulatory. ? ?Past Medical History:  ?Diagnosis Date  ? Arthritis   ? Coronary artery disease   ? Diabetes mellitus   ? type 2  ? DJD (degenerative joint disease)   ? R hip DJD  ? H/O: upper GI bleed 01/2001   ? related to nonsteroidal use, CLO test +(Med Tx- Prilosec, Amoxicillin, & Biaxin)  ? Hypertension   ? Insomnia   ? Mental disorder   ? PTSD  ? Peptic ulcer disease   ? history of gastric ulcer  ? PTSD (post-traumatic stress disorder)   ? [facility]  ? Sarcoidosis   ? late 1970s  ? Scleritis   ? Sleep apnea   ? cpap with O2 4L for 4 yrs  ? Tubular adenoma 2004  ? ? ?Past Surgical History:  ?Procedure Laterality Date  ? CARDIAC CATHETERIZATION    ? CORONARY ARTERY BYPASS GRAFT  12/02/2017  ? at [facility] Dr Lunette Stands Surgeon  ? EYE SURGERY  05  ? growth lft  ? TOTAL HIP ARTHROPLASTY  06/13/2012  ? TOTAL HIP ARTHROPLASTY  06/13/2012  ? Procedure: TOTAL HIP ARTHROPLASTY ANTERIOR APPROACH;  Surgeon: Hessie Dibble, MD;  Location: [place];  Service: Orthopedics;  Laterality: Right;   right anterior approach total hip arthroplasty  ? ? ?Family History  ?Problem Relation Age of Onset  ? Stroke Mother   ? Hypertension Mother   ? Hypertension Father   ? Hypertension Brother   ? Gout Brother   ? Deep vein thrombosis Brother   ? ? ?Social History:  reports that he has never smoked. He has never used smokeless tobacco. He reports current alcohol use. He reports that he does not use drugs. ? ?Allergies: No Known Allergies ? ?Medications: I have reviewed the patient's current medications. ? ?Results for orders placed or performed during the hospital encounter of 02/11/22 (from the past 48 hour(s))  ?Glucose, capillary     Status: Abnormal  ? Collection Time: 02/12/22 12:20 AM  ?Result Value Ref Range  ? Glucose-Capillary 111 (H) 70 - 99 mg/dL  ?  Comment: Glucose reference range applies only to samples taken after fasting for at least 8 hours.  ?CBC     Status: Abnormal  ? Collection Time: 02/12/22 12:21 AM  ?Result Value Ref Range  ? WBC 7.0 4.0 - 10.5 K/uL  ? RBC 4.87 4.22 - 5.81 MIL/uL  ? Hemoglobin 16.4 13.0 - 17.0 g/dL  ? HCT 48.5 39.0 - 52.0 %  ? MCV 99.6  80.0 - 100.0 fL  ? MCH 33.7 26.0 - 34.0 pg  ? MCHC 33.8 30.0 - 36.0 g/dL  ? RDW 13.3 11.5 - 15.5 %  ? Platelets 313 150 - 400 K/uL  ? nRBC 0.3 (H) 0.0 - 0.2 %  ?  Comment: Performed at [facility], [place] [address] [place], [place] [address]  ?Basic metabolic panel     Status: Abnormal  ? Collection Time: 02/12/22 12:21 AM  ?Result Value Ref Range  ? Sodium 139 135 - 145 mmol/L  ? Potassium 3.7 3.5 - 5.1 mmol/L  ? Chloride 107 98 - 111 mmol/L  ? CO2 22 22 - 32 mmol/L  ? Glucose, Bld 112 (H) 70 - 99 mg/dL  ?  Comment: Glucose reference range applies only to samples taken after fasting for at least 8 hours.  ? BUN 20 8 - 23 mg/dL  ? Creatinine, Ser 0.95 0.61 - 1.24 mg/dL  ? Calcium 10.1 8.9 - 10.3 mg/dL  ? GFR, Estimated >60 >60 mL/min  ?  Comment: (NOTE) ?Calculated using the CKD-EPI Creatinine Equation (2021) ?  ? Anion gap 10 5  - 15  ?  Comment: Performed at [facility], [place] [address], [place], [place] [address]  ?Hemoglobin A1c     Status: None  ? Collection Time: 02/12/22 12:21 AM  ?Result Value Ref Range  ? Hgb A1c MFr Bld 5.4 4.8 - 5.6 %  ?  Comment: (NOTE) ?Pre diabetes:          5.7%-6.4% ? ?Diabetes:              >6.4% ? ?Glycemic control for   <7.0% ?adults with diabetes ?  ? Mean Plasma Glucose 108.28 mg/dL  ?  Comment: Performed at [facility], [place] [address], [place], [place] [address]  ?Glucose, capillary     Status: None  ? Collection Time: 02/12/22  3:12 AM  ?Result Value Ref Range  ? Glucose-Capillary 93 70 - 99 mg/dL  ?  Comment: Glucose reference range applies only to samples taken after fasting for at least 8 hours.  ?Type and screen [place]     Status: None  ? Collection Time: 02/12/22  5:25 AM  ?Result Value Ref Range  ? ABO/RH(D) O POS   ? Antibody Screen NEG   ? Sample Expiration    ?  02/15/2022,2359 ?Performed at [facility], [place] [address], [place], [place] [address] ?  ?CBC     Status: None  ? Collection Time: 02/12/22  5:25 AM  ?Result Value Ref Range  ? WBC 6.5 4.0 - 10.5 K/uL  ? RBC 5.07 4.22 - 5.81 MIL/uL  ? Hemoglobin 16.8 13.0 - 17.0 g/dL  ? HCT 50.7 39.0 - 52.0 %  ? MCV 100.0 80.0 - 100.0 fL  ? MCH 33.1 26.0 - 34.0 pg  ? MCHC 33.1 30.0 - 36.0 g/dL  ? RDW 13.5 11.5 - 15.5 %  ? Platelets 302 150 - 400 K/uL  ? nRBC 0.0 0.0 - 0.2 %  ?  Comment: Performed at [facility], [place] [address], [place], [place] [address]  ?Basic metabolic panel     Status: None  ? Collection Time: 02/12/22  5:25 AM  ?Result Value Ref Range  ? Sodium 140 135 - 145 mmol/L  ? Potassium 3.9 3.5 - 5.1 mmol/L  ? Chloride 104 98 - 111 mmol/L  ? CO2 24 22 - 32  mmol/L  ? Glucose, Bld 94 70 - 99 mg/dL  ?  Comment: Glucose reference range applies only to samples taken after fasting for at least 8 hours.  ? BUN 20 8 - 23 mg/dL  ? Creatinine, Ser 0.89 0.61 -  1.24 mg/dL  ? Calcium 10.0 8.9 - 10.3 mg/dL  ? GFR, Estimated >60 >60 mL/min  ?  Comment: (NOTE) ?Calculated using the CKD-EPI Creatinine Equation (2021) ?  ? Anion gap 12 5 - 15  ?  Comment: Performed at [facility], [place] [address], [place], [place] [address]  ?Glucose, capillary     Status: None  ? Collection Time: 02/12/22  7:36 AM  ?Result Value Ref Range  ? Glucose-Capillary 92 70 - 99 mg/dL  ?  Comment: Glucose reference range applies only to samples taken after fasting for at least 8 hours.  ? ? ?MR PELVIS WO CONTRAST ? ?Result Date: 02/11/2022 ?CLINICAL DATA:  Left hip pain EXAM: MR OF THE LEFT HIP WITHOUT CONTRAST TECHNIQUE: Multiplanar, multisequence MR imaging was performed. No intravenous contrast was administered. COMPARISON:  None Available. FINDINGS: MRI PELVIS: Bones/Joint/Cartilage Nondisplaced transverse fracture of lower sacrum at the level of S4 and S5 (coronal T1 image 3, sagittal T2 image 33). There is a subcapital/transcervical left femoral neck fracture with intense associated marrow edema. There is degenerative disc disease at L5-S1, partially visualized, with degenerative endplate edema. There are mild degenerative changes of the sacroiliac joints bilaterally. 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 Ligaments Grossly intact. Muscles and Tendons There is mild intramuscular edema of the left gluteus minimus. There is generalized mild atrophy. Soft tissue There is lower parasacral soft tissue edema. No focal fluid collection. MRI LEFT HIP: Bones: There is a subcapital/transcervical left femoral neck fracture with mild angulation. There is intense associated marrow edema. Right hip arthroplasty with associated susceptibility artifact obscuring the adjacent bone and soft tissues. There is a lower sacral fracture as described above. Articular cartilage and labrum Articular cartilage:  Moderate  chondrosis. Labrum:  Degenerative anterior superior labral tearing. Joint or bursal effusion Joint effusion:  Small joint effusion. Bursae: No evidence of trochanteric bursitis. Muscles and tendons Muscles

## 2022-02-12 NOTE — Progress Notes (Signed)
Pt refused cpap tonight. Pt said he is fine without it.  ?

## 2022-02-12 NOTE — Transfer of Care (Signed)
Immediate Anesthesia Transfer of Care Note ? ?Patient: Derek [name] ? ?Procedure(s) Performed: CANNULATED HIP PINNING (Left: Hip) ? ?Patient Location: PACU ? ?Anesthesia Type:General ? ?Level of Consciousness: drowsy ? ?Airway & Oxygen Therapy: Patient Spontanous Breathing ? ?Post-op Assessment: Report given to RN and Post -op Vital signs reviewed and stable ? ?Post vital signs: Reviewed and stable ? ?Last Vitals:  ?Vitals Value Taken Time  ?BP 135/81 02/12/22 1406  ?Temp    ?Pulse 69 02/12/22 1407  ?Resp 11 02/12/22 1407  ?SpO2 100 % 02/12/22 1407  ?Vitals shown include unvalidated device data. ? ?Last Pain:  ?Vitals:  ? 02/12/22 1217  ?TempSrc:   ?PainSc: 0-No pain  ?   ? ?  ? ?Complications: No notable events documented. ?

## 2022-02-12 NOTE — Op Note (Signed)
?  JANZIEL HOCKETT ?male ?76 y.o. ?02/12/2022 ? ?PreOperative Diagnosis: ?Left  minimally displaced femoral neck fracture ? ?PostOperative Diagnosis: ?Same ? ?PROCEDURE: ?Close reduction percutaneous pin fixation of left femoral neck fracture ? ?SURGEON: Melony Overly, MD ? ?ASSISTANT: None ? ?ANESTHESIA: General LMA anesthesia ? ?FINDINGS: ?See below ? ?IMPLANTS: ?[employer] stainless steel 7.5 mm partially-threaded cannulated screws ? ?INDICATIONS:75 y.o. male fell at home.  He had difficulty weightbearing and therefore was brought to the emergency department.  X-rays did not reveal an obvious fracture and therefore MRI scan was ordered.  This demonstrated a nondisplaced femoral neck fracture.  Patient was indicated for surgery due to his ambulatory status.   Patient understood the risks, benefits and alternatives to surgery which include but are not limited to wound healing complications, infection, nonunion, malunion, need for further surgery as well as damage to surrounding structures. They also understood the potential for continued pain in that there were no guarantees of acceptable outcome After weighing these risks the patient opted to proceed with surgery. ? ?PROCEDURE: ?Patient was identified in the preoperative holding area.  The left hip was marked by myself.  Consent was signed by myself and the patient.  Patient was taken to the operative suite where general endotracheal tube anesthesia was induced without difficulty.  Preoperative antibiotics were given.  Patient was then transferred to the [facility] table.  The operative foot was placed in the boot and the non-operative leg was strapped to the cross beam using pad and Coban.  Fluoroscopic images were obtained and we are able to visualize the femoral neck in the AP and lateral plane well.  Then the left hip was prepped and draped in usual sterile fashion.  Surgical timeout was performed. ?  ?I began by localizing the femoral neck.  Then through  percutaneous stab technique K wires were placed in an inverted triangle position of the femoral neck and seated well into the femoral head.  3 pins were placed.  Then through stab incisions 3 cannulated screws that were partially threaded were placed over the wires with good fixation and bite.  Afterwards final fluoroscopic images were obtained and showed appropriate position of the screws.  The femoral neck did not displace. ?  ?Then the wound was irrigated copiously with normal saline.  Staples were used to close the wounds.  Mepilex and soft dressing were placed about the wound. ?  ?Patient was then awakened from anesthesia and taken recovery in stable condition.  There were no complications. ?  ?  ?POST OPERATIVE INSTRUCTIONS: ?Weightbearing as tolerated to left lower extremity ?Okay to continue DVT prophylaxis ?Will limit narcotic use given geriatric patient ?Upon discharge patient will follow-up in 2 weeks for wound check, suture removal and x-rays of the operative hip.  ? ? ?TOURNIQUET TIME: No tourniquet ? ?BLOOD LOSS:  Minimal ?        ?DRAINS: none ?        ?SPECIMEN: none ?      ?COMPLICATIONS:  * No complications entered in OR log * ?        ?Disposition: PACU - hemodynamically stable. ?        ?Condition: stable  ?

## 2022-02-12 NOTE — Anesthesia Postprocedure Evaluation (Signed)
Anesthesia Post Note ? ?Patient: Derek [name] ? ?Procedure(s) Performed: CANNULATED HIP PINNING (Left: Hip) ? ?  ? ?Patient location during evaluation: PACU ?Anesthesia Type: General ?Level of consciousness: awake and alert ?Pain management: pain level controlled ?Vital Signs Assessment: post-procedure vital signs reviewed and stable ?Respiratory status: spontaneous breathing, nonlabored ventilation and respiratory function stable ?Cardiovascular status: stable and blood pressure returned to baseline ?Anesthetic complications: no ? ? ?No notable events documented. ? ?Last Vitals:  ?Vitals:  ? 02/12/22 1445 02/12/22 1506  ?BP: (!) 150/90 (!) 141/95  ?Pulse: 87 83  ?Resp: 18 14  ?Temp: 36.7 ?C 37.6 ?C  ?SpO2: 100% 99%  ?  ?Last Pain:  ?Vitals:  ? 02/12/22 1506  ?TempSrc: Oral  ?PainSc:   ? ? ?  ?  ?  ?  ?  ?  ? ?Audry Pili ? ? ? ? ?

## 2022-02-12 NOTE — Anesthesia Procedure Notes (Signed)
Procedure Name: LMA Insertion ?Date/Time: 02/12/2022 1:05 PM ?Performed by: Niel Hummer, CRNA ?Pre-anesthesia Checklist: Patient identified, Emergency Drugs available, Patient being monitored and Suction available ?Patient Re-evaluated:Patient Re-evaluated prior to induction ?Oxygen Delivery Method: Circle system utilized ?Preoxygenation: Pre-oxygenation with 100% oxygen ?Induction Type: IV induction ?LMA: LMA with gastric port inserted ?LMA Size: 4.0 ?Number of attempts: 1 ?Dental Injury: Teeth and Oropharynx as per pre-operative assessment  ? ? ? ? ?

## 2022-02-12 NOTE — Anesthesia Preprocedure Evaluation (Addendum)
Anesthesia Evaluation  ?Patient identified by MRN, date of birth, ID band ?Patient awake ? ? ? ?Reviewed: ?Allergy & Precautions, NPO status , Patient's Chart, lab work & pertinent test results, reviewed documented beta blocker date and time  ? ?History of Anesthesia Complications ?Negative for: history of anesthetic complications ? ?Airway ?Mallampati: II ? ?TM Distance: >3 FB ?Neck ROM: Full ? ? ? Dental ? ?(+) Dental Advisory Given ?  ?Pulmonary ?sleep apnea, Continuous Positive Airway Pressure Ventilation and Oxygen sleep apnea ,  ?  ?Pulmonary exam normal ? ? ? ? ? ? ? Cardiovascular ?hypertension, Pt. on home beta blockers and Pt. on medications ?+ CAD and + CABG  ?Normal cardiovascular exam ? ? ?  ?Neuro/Psych ?PSYCHIATRIC DISORDERS Anxiety Depression  ?Mild cognitive dysfunction ? ?TIA  ? GI/Hepatic ?Neg liver ROS, PUD,   ?Endo/Other  ?diabetes, Type 2, Oral Hypoglycemic Agents ? Renal/GU ?negative Renal ROS  ? ?  ?Musculoskeletal ? ?(+) Arthritis , Osteoarthritis and Rheumatoid disorders,   ? Abdominal ?  ?Peds ? Hematology ?negative hematology ROS ?(+)   ?Anesthesia Other Findings ?Hx sarcoidosis ? ? Reproductive/Obstetrics ? ?  ? ? ? ? ? ? ? ? ? ? ? ? ? ?  ?  ? ? ? ? ? ? ? ?Anesthesia Physical ?Anesthesia Plan ? ?ASA: 3 ? ?Anesthesia Plan: General  ? ?Post-op Pain Management: Tylenol PO (pre-op)*  ? ?Induction: Intravenous ? ?PONV Risk Score and Plan: 2 and Treatment may vary due to age or medical condition, Ondansetron and Propofol infusion ? ?Airway Management Planned: LMA ? ?Additional Equipment: None ? ?Intra-op Plan:  ? ?Post-operative Plan: Extubation in OR ? ?Informed Consent: I have reviewed the patients History and Physical, chart, labs and discussed the procedure including the risks, benefits and alternatives for the proposed anesthesia with the patient or authorized representative who has indicated his/her understanding and acceptance.  ? ? ? ?Dental advisory  given and Consent reviewed with POA ? ?Plan Discussed with: CRNA and Anesthesiologist ? ?Anesthesia Plan Comments:   ? ? ? ? ? ?Anesthesia Quick Evaluation ? ?

## 2022-02-13 DIAGNOSIS — I2510 Atherosclerotic heart disease of native coronary artery without angina pectoris: Secondary | ICD-10-CM | POA: Diagnosis not present

## 2022-02-13 DIAGNOSIS — S3210XA Unspecified fracture of sacrum, initial encounter for closed fracture: Secondary | ICD-10-CM | POA: Diagnosis not present

## 2022-02-13 DIAGNOSIS — S72002A Fracture of unspecified part of neck of left femur, initial encounter for closed fracture: Secondary | ICD-10-CM | POA: Diagnosis not present

## 2022-02-13 DIAGNOSIS — I10 Essential (primary) hypertension: Secondary | ICD-10-CM | POA: Diagnosis not present

## 2022-02-13 LAB — BASIC METABOLIC PANEL
Anion gap: 14 (ref 5–15)
BUN: 18 mg/dL (ref 8–23)
CO2: 22 mmol/L (ref 22–32)
Calcium: 9.8 mg/dL (ref 8.9–10.3)
Chloride: 104 mmol/L (ref 98–111)
Creatinine, Ser: 0.97 mg/dL (ref 0.61–1.24)
GFR, Estimated: >60 mL/min (ref >60)
Glucose, Bld: 99 mg/dL (ref 70–99)
Potassium: 3.9 mmol/L (ref 3.5–5.1)
Sodium: 140 mmol/L (ref 135–145)

## 2022-02-13 LAB — CBC
HCT: 47.7 % (ref 39.0–52.0)
Hemoglobin: 16.3 g/dL (ref 13.0–17.0)
MCH: 34.7 pg — ABNORMAL HIGH (ref 26.0–34.0)
MCHC: 34.2 g/dL (ref 30.0–36.0)
MCV: 101.5 fL — ABNORMAL HIGH (ref 80.0–100.0)
Platelets: 292 10*3/uL (ref 150–400)
RBC: 4.7 MIL/uL (ref 4.22–5.81)
RDW: 13.7 % (ref 11.5–15.5)
WBC: 11.1 10*3/uL — ABNORMAL HIGH (ref 4.0–10.5)
nRBC: 0 % (ref 0.0–0.2)

## 2022-02-13 LAB — GLUCOSE, CAPILLARY
Glucose-Capillary: 126 mg/dL — ABNORMAL HIGH (ref 70–99)
Glucose-Capillary: 111 mg/dL — ABNORMAL HIGH (ref 70–99)
Glucose-Capillary: 136 mg/dL — ABNORMAL HIGH (ref 70–99)
Glucose-Capillary: 136 mg/dL — ABNORMAL HIGH (ref 70–99)
Glucose-Capillary: 136 mg/dL — ABNORMAL HIGH (ref 70–99)

## 2022-02-13 MED ORDER — INSULIN ASPART 100 UNIT/ML IJ SOLN
0.0000 [IU] | Freq: Three times a day (TID) | INTRAMUSCULAR | Status: DC
  Administered 2022-02-13: 1 [IU] via SUBCUTANEOUS
  Administered 2022-02-14: 2 [IU] via SUBCUTANEOUS
  Administered 2022-02-14 – 2022-02-15 (×3): 1 [IU] via SUBCUTANEOUS
  Administered 2022-02-15: 2 [IU] via SUBCUTANEOUS
  Administered 2022-02-16 (×2): 1 [IU] via SUBCUTANEOUS
  Administered 2022-02-16 – 2022-02-17 (×2): 2 [IU] via SUBCUTANEOUS

## 2022-02-13 MED ORDER — HYDROCODONE-ACETAMINOPHEN 5-325 MG PO TABS
1.0000 | ORAL_TABLET | Freq: Four times a day (QID) | ORAL | 0 refills | Status: DC | PRN
Start: 2022-02-13 — End: 2022-03-05

## 2022-02-13 MED ORDER — METHOTREXATE 2.5 MG PO TABS: 10.0000 mg | ORAL_TABLET | ORAL | Status: DC

## 2022-02-13 MED ORDER — INSULIN ASPART 100 UNIT/ML IJ SOLN: 0.0000 [IU] | Freq: Every day | INTRAMUSCULAR | Status: DC

## 2022-02-13 MED ORDER — ASPIRIN EC 81 MG PO TBEC
81.0000 mg | DELAYED_RELEASE_TABLET | Freq: Every day | ORAL | Status: DC
  Administered 2022-02-13 – 2022-02-17 (×5): 81 mg via ORAL
  Filled 2022-02-13 (×5): qty 1

## 2022-02-13 MED ORDER — ASPIRIN 325 MG PO TABS: 325.0000 mg | ORAL_TABLET | Freq: Every day | ORAL | 0 refills | Status: AC

## 2022-02-13 NOTE — Evaluation (Addendum)
Physical Therapy Evaluation ?Patient Details ?Name: Derek [name] ?MRN: [id number] ?DOB: [birth year] ?Today's Date: 02/13/2022 ? ?History of Present Illness ? Patient is a 76 year old male with history of CAD status post CABG, diabetes, hypertension, PTSD/depression, OSA on CPAP, gout, glaucoma, RA, mild cognitive impairment presented after multiple falls, subsequent pain and difficulty walking. Pt had numerous falls at home with increased difficulty in getting around and they had done x-ray, but nothing showed.  After last fall they did a MRI. MRI of the left hip and pelvis showed nondisplaced subcapital/transcervical left femoral neck fracture. Pt s/p Close reduction percutaneous pin fixation of left femoral neck fracture (02/12/22)  ?Clinical Impression ? Pt admitted with above diagnosis.  Pt currently with functional limitations due to the deficits listed below (see PT Problem List). Pt will benefit from skilled PT to increase their independence and safety with mobility to allow discharge to the venue listed below.  Recommend short term rehab prior to going home with wife. ?   ?   ? ?Recommendations for follow up therapy are one component of a multi-disciplinary discharge planning process, led by the attending physician.  Recommendations may be updated based on patient status, additional functional criteria and insurance authorization. ? ?Follow Up Recommendations Skilled nursing-short term rehab (<3 hours/day) ? ?  ?Assistance Recommended at Discharge    ?Patient can return home with the following ? A little help with walking and/or transfers ? ?  ?Equipment Recommendations None recommended by PT  ?Recommendations for Other Services ?    ?  ?Functional Status Assessment Patient has had a recent decline in their functional status and demonstrates the ability to make significant improvements in function in a reasonable and predictable amount of time.  ? ?  ?Precautions / Restrictions Precautions ?Precautions:  Fall ?Restrictions ?Weight Bearing Restrictions: Yes ?LLE Weight Bearing: Weight bearing as tolerated  ? ?  ? ?Mobility ? Bed Mobility ?  ?  ?  ?  ?  ?  ?  ?General bed mobility comments: up in recliner upon arrival ?  ? ?Transfers ?Overall transfer level: Needs assistance ?Equipment used: Rolling walker (2 wheels) ?Transfers: Sit to/from Stand ?Sit to Stand: Min assist ?  ?  ?  ?  ?  ?General transfer comment: MIN A to power up and for hand placement.  Could not follow instructions for taking hand back with stand > sit ?  ? ?Ambulation/Gait ?Ambulation/Gait assistance: Min assist ?Gait Distance (Feet): 10 Feet ?Assistive device: Rolling walker (2 wheels) ?Gait Pattern/deviations: Decreased step length - right, Decreased step length - left ?Gait velocity: decreased ?  ?  ?General Gait Details: Cues for sequencing and MIN A to progress RW at times as he would step forward without moving RW. Denied pain, but reported feeling tired with gait. ? ?Stairs ?  ?  ?  ?  ?  ? ?Wheelchair Mobility ?  ? ?Modified Rankin (Stroke Patients Only) ?  ? ?  ? ?Balance Overall balance assessment: History of Falls ?  ?  ?  ?  ?  ?  ?  ?  ?  ?  ?  ?  ?  ?  ?  ?  ?  ?  ?   ? ? ? ?Pertinent Vitals/Pain Pain Assessment ?Pain Assessment: Faces ?Faces Pain Scale: Hurts little more ?Pain Location: L Leg with transitional movements ?Pain Descriptors / Indicators: Grimacing ?Pain Intervention(s): Limited activity within patient's tolerance, Monitored during session, Repositioned  ? ? ?Home Living Family/patient expects to be  discharged to:: Skilled nursing facility ?Living Arrangements: Spouse/significant other ?Available Help at Discharge: Family ?Type of Home: House ?Home Access: Ramped entrance ?  ?  ?Alternate Level Stairs-Number of Steps: 13 with stair lift ?Home Layout: Two level ?Home Equipment: Rollator (4 wheels);Shower seat;Cane - single point ?   ?  ?Prior Function Prior Level of Function : Independent/Modified Independent ?  ?  ?  ?   ?  ?  ?Mobility Comments: Amb with cane or rollator depending on the day until the past week when he couldn't put weight on his leg ?ADLs Comments: not driving ?  ? ? ?Hand Dominance  ?   ? ?  ?Extremity/Trunk Assessment  ? Upper Extremity Assessment ?Upper Extremity Assessment: Defer to OT evaluation ?  ? ?Lower Extremity Assessment ?Lower Extremity Assessment: LLE deficits/detail ?LLE Deficits / Details: Hip abduction limited to 30 degrees, Hip flexion 45 degrees ?  ? ?   ?Communication  ?    ?Cognition Arousal/Alertness: Awake/alert ?Behavior During Therapy: [facility] for tasks assessed/performed ?Overall Cognitive Status: History of cognitive impairments - at baseline ?  ?  ?  ?  ?  ?  ?  ?  ?  ?  ?  ?  ?  ?  ?  ?  ?  ?  ?  ? ?  ?General Comments   ? ?  ?Exercises    ? ?Assessment/Plan  ?  ?PT Assessment Patient needs continued PT services  ?PT Problem List Decreased strength;Decreased range of motion;Decreased activity tolerance;Decreased balance;Decreased mobility;Decreased safety awareness ? ?   ?  ?PT Treatment Interventions DME instruction;Gait training;Functional mobility training;Neuromuscular re-education;Balance training;Therapeutic exercise;Therapeutic activities;Patient/family education   ? ?PT Goals (Current goals can be found in the Care Plan section)  ?Acute Rehab PT Goals ?Patient Stated Goal: Go to rehab to get stronger before coming home ?PT Goal Formulation: With family ?Time For Goal Achievement: 02/27/22 ?Potential to Achieve Goals: Good ? ?  ?Frequency Min 3X/week ?  ? ? ?Co-evaluation   ?  ?  ?  ?  ? ? ?  ?AM-PAC PT "6 Clicks" Mobility  ?Outcome Measure Help needed turning from your back to your side while in a flat bed without using bedrails?: A Little ?Help needed moving from lying on your back to sitting on the side of a flat bed without using bedrails?: A Little ?Help needed moving to and from a bed to a chair (including a wheelchair)?: A Little ?Help needed standing up from a chair using  your arms (e.g., wheelchair or bedside chair)?: A Little ?Help needed to walk in hospital room?: A Little ?Help needed climbing 3-5 steps with a railing? : A Lot ?6 Click Score: 17 ? ?  ?End of Session Equipment Utilized During Treatment: Gait belt ?Activity Tolerance: Patient limited by fatigue ?Patient left: in chair;with call bell/phone within reach;with chair alarm set;with family/visitor present ?Nurse Communication: Mobility status ?PT Visit Diagnosis: Muscle weakness (generalized) (M62.81);Difficulty in walking, not elsewhere classified (R26.2) ?  ? ?Time: 1036-1100 ?PT Time Calculation (min) (ACUTE ONLY): 24 min ? ? ?Charges:   PT Evaluation ?$PT Eval Moderate Complexity: 1 Mod ?PT Treatments ?$Gait Training: 8-22 mins ?  ?   ? ? ?Santiago Glad L. Tamala Julian, PT ? ?02/13/2022 ? ? ?Galen Manila ?02/13/2022, 11:33 AM ? ?

## 2022-02-13 NOTE — Discharge Instructions (Signed)
DR. [facility] POST-OP INSTRUCTIONS ? ? ?Pain Management ?Take all prescribed medication as directed. ?If taking narcotic pain medication you may want to use an over-the-counter stool softener to avoid constipation. ? ?Activity ?Weightbearing as tolerated with walker  ?Keep dressing intact ? ?First Postoperative Visit ?Your first postop visit will be at least 2 weeks after surgery.  This should be scheduled when you schedule surgery. ?If you do not have a postoperative visit scheduled please call [phone] to schedule an appointment. ?At the appointment your incision will be evaluated for suture removal, x-rays will be obtained if necessary. ? ?General Instructions ?Swelling is very common after surgery.  It mat take  3 months to begin to feel comfortable.   ?DO NOT change the dressing.  If there is a problem with the dressing (too tight, loose, gets wet, etc.) please contact Dr. Pollie Friar office. ?DO NOT get the dressing wet.  For showers you can use an over-the-counter cast cover or wrap a washcloth around the top of your dressing and then cover it with a plastic bag and tape it to your leg. ?DO NOT soak the incision (no tubs, pools, bath, etc.) until you have approval from Dr. Lucia Gaskins. ? ?Contact Dr. Huel Cote office or go to Emergency Room if: ?Temperature above 101? F. ?Increasing pain that is unresponsive to pain medication or elevation ?Excessive redness or swelling in your foot ?Dressing problems - excessive bloody drainage, looseness or tightness, or if dressing gets wet ?Develop pain, swelling, warmth, or discoloration of your calf ? ?

## 2022-02-13 NOTE — Plan of Care (Signed)

## 2022-02-13 NOTE — Progress Notes (Signed)
?      ? ? ? [facility] ?                                                                           ? ? ?Derek [name], is a 76 y.o. male, DOB - 1945-10-28, KVQ:[id number] ?Admit date - 02/11/2022    ?Outpatient Primary MD for the patient is Lavone Orn, MD ? ?LOS - 2  days ? ?Chief Complaint  ?Patient presents with  ? Foot Pain  ?    ? ?Brief summary  ? ?Patient is a 76 year old male with history of CAD status post CABG, diabetes, hypertension, PTSD/depression, OSA on CPAP, gout, glaucoma, RA, mild cognitive impairment presented after multiple falls, subsequent pain and difficulty walking. ?Patient has had ongoing falls for the past month, had pain in his left lower extremity with difficulty bearing weight and difficulty walking secondary to these falls. ?Patient had another fall on Sunday, 02/07/2022 with increased pain and difficulty walking. ?X-ray showed a subtle lucency on the left hip x-ray, recommended further imaging ?MRI of the left hip and pelvis showed nondisplaced subcapital/transcervical left femoral neck fracture. ?Orthopedics consulted ? ? ?Assessment & Plan  ? ? ?Principal Problem: ?  Closed left hip fracture, mechanical fall ?-Orthopedics consulted,  ?-Underwent closed reduction percutaneous pin fixation of the left femoral neck fracture, postop day #1 ?-Continue pain control, DVT prophylaxis per orthopedics ?-Started PT today, recommended skilled nursing facility rehab ?-Per orthopedics, weightbearing as tolerated to left lower extremity, follow-up in 2 weeks ? ?Active Problems: ?CAD status post CABG ?-Continue home aspirin, Crestor  ? ?  Depression, PTSD, insomnia ?-Continue home trazodone, fluoxetine ? ?Diabetes mellitus type 2 ?-CBG stable, continue SSI sensitive ?-Hold metformin XR, Jardiance ?-Hemoglobin A1c 5.4  ? ? ?OSA ?-Continue home CPAP ? ?History of rheumatoid arthritis ?-Continue methotrexate ? ?Glaucoma ?-Continue eyedrops ? ? ?Code Status: Full CODE STATUS ?DVT Prophylaxis:   enoxaparin (LOVENOX) injection 40 mg Start: 02/13/22 0800 ?SCDs Start: 02/12/22 1513 ?SCDs Start: 02/11/22 2331 ? ? ?Level of Care: Level of care: Telemetry ?Family Communication:  ? ?Disposition Plan:      Remains inpatient appropriate: Postop day #1, plan for SNF ? ? ?Procedures:  ?Close reduction percutaneous pin fixation of left femoral neck fracture ? ?Consultants:   ?Ortho  ? ?Antimicrobials:  ? ?Anti-infectives (From admission, onward)  ? ? Start     Dose/Rate Route Frequency Ordered Stop  ? 02/12/22 1900  ceFAZolin (ANCEF) IVPB 1 g/50 mL premix       ? 1 g ?100 mL/hr over 30 Minutes Intravenous Every 6 hours 02/12/22 1512 02/13/22 0658  ? 02/12/22 1115  ceFAZolin (ANCEF) IVPB 2g/100 mL premix       ? 2 g ?200 mL/hr over 30 Minutes Intravenous On call to O.R. 02/12/22 1027 02/12/22 1338  ? ?  ? ? ? ?Medications ? docusate sodium  100 mg Oral BID  ? enoxaparin (LOVENOX) injection  40 mg Subcutaneous Q24H  ? FLUoxetine  20 mg Oral q morning  ? insulin aspart  0-15 Units Subcutaneous Q4H  ? [START ON 02/19/2022] methotrexate  10 mg Oral Q Fri  ? [START ON 02/20/2022] methotrexate  10 mg Oral Q Sat  ?  metoprolol succinate  12.5 mg Oral q morning  ? rosuvastatin  20 mg Oral q morning  ? ? ? ? ?Subjective:  ? ?Derek [name] was seen and examined today.  No acute complaints, postop day 1.  Patient denies dizziness, chest pain, shortness of breath, abdominal pain, N/V.  ? ?Objective:  ? ?Vitals:  ? 02/12/22 2022 02/12/22 2357 02/13/22 0435 02/13/22 1242  ?BP: (!) 133/95 125/86 126/81 106/66  ?Pulse: 86 87 96 81  ?Resp: '18 18 18 14  '$ ?Temp: 98.3 ?F (36.8 ?C) 99.1 ?F (37.3 ?C) 98.8 ?F (37.1 ?C) 97.9 ?F (36.6 ?C)  ?TempSrc: Oral  Oral Oral  ?SpO2: 98% 98% 95% 95%  ?Weight:      ?Height:      ? ? ?Intake/Output Summary (Last 24 hours) at 02/13/2022 1420 ?Last data filed at 02/13/2022 0830 ?Gross per 24 hour  ?Intake 300 ml  ?Output 200 ml  ?Net 100 ml  ? ? ? ?Wt Readings from Last 3 Encounters:  ?02/11/22 75.8 kg   ?06/16/18 82.7 kg  ?03/07/18 86.6 kg  ? ?Physical Exam ?General: Alert and oriented x 3, NAD ?Cardiovascular: S1 S2 clear, RRR. ?Respiratory: CTAB, no wheezing, rales or rhonchi ?Gastrointestinal: Soft, nontender, nondistended, NBS ?Ext: no pedal edema bilaterally ?Neuro: no new deficits ?Psych: Normal affect and demeanor, alert and oriented x3  ? ? ? ?Data Reviewed:  I have personally reviewed following labs  ? ? ?CBC ?Lab Results  ?Component Value Date  ? WBC 11.1 (H) 02/13/2022  ? RBC 4.70 02/13/2022  ? HGB 16.3 02/13/2022  ? HCT 47.7 02/13/2022  ? MCV 101.5 (H) 02/13/2022  ? MCH 34.7 (H) 02/13/2022  ? PLT 292 02/13/2022  ? MCHC 34.2 02/13/2022  ? RDW 13.7 02/13/2022  ? LYMPHSABS 2.5 06/06/2012  ? MONOABS 0.6 06/06/2012  ? EOSABS 0.3 06/06/2012  ? BASOSABS 0.1 06/06/2012  ? ? ? ?Last metabolic panel ?Lab Results  ?Component Value Date  ? NA 140 02/13/2022  ? K 3.9 02/13/2022  ? CL 104 02/13/2022  ? CO2 22 02/13/2022  ? BUN 18 02/13/2022  ? CREATININE 0.97 02/13/2022  ? GLUCOSE 99 02/13/2022  ? GFRNONAA >60 02/13/2022  ? GFRAA >90 06/15/2012  ? CALCIUM 9.8 02/13/2022  ? ANIONGAP 14 02/13/2022  ? ? ?CBG (last 3)  ?Recent Labs  ?  02/13/22 ?0431 02/13/22 ?0724 02/13/22 ?1105  ?GLUCAP 126* 111* 136*  ?  ? ? ?Coagulation Profile: ?No results for input(s): INR, PROTIME in the last 168 hours. ? ? ?Radiology Studies: I have personally reviewed the imaging studies  ?MR PELVIS WO CONTRAST ?IMPRESSION: Nondisplaced subcapital/transcervical left femoral neck fracture with mild angulation and intense associated marrow edema. Nondisplaced transverse sacral fracture at the level of S4 and S5. High-grade partial tear of the left gluteus minimus tendon at the greater trochanter and involving the vastus lateralis origin. Electronically Signed   By: Maurine Simmering M.D.   On: 02/11/2022 19:00  ? ?MR HIP LEFT WO CONTRAST ? ?Result Date: 02/11/2022 ? IMPRESSION: Nondisplaced subcapital/transcervical left femoral neck fracture with mild  angulation and intense associated marrow edema. Nondisplaced transverse sacral fracture at the level of S4 and S5. High-grade partial tear of the left gluteus minimus tendon at the greater trochanter and involving the vastus lateralis origin. Electronically Signed   By: Maurine Simmering M.D.   On: 02/11/2022 19:00  ? ?DG Hip Unilat W or Wo Pelvis 2-3 Views Left ? ?Result Date: 02/11/2022 ? IMPRESSION: 1. There is subtle curvilinear  lucency within the superolateral left femoral head-neck junction which may be related to osteoarthritis and a mild CAM-type bump deformity. However, is similar to exclude a nondisplaced acute to subacute fracture. Recommend clinical correlation. Note is made that CT or MRI of the more sensitive for this indication. 2. Status post total right hip arthroplasty without evidence of hardware failure. Electronically Signed   By: Yvonne Kendall M.D.   On: 02/11/2022 16:24   ? ? ? ? ?Estill Cotta M.D. ?[facility] ?02/13/2022, 2:20 PM ? ?Available via Epic secure chat 7am-7pm ?After 7 pm, please refer to night coverage provider listed on amion. ? ? ? ?

## 2022-02-13 NOTE — Progress Notes (Signed)
? ? ? ?  Derek [name] is a 76 y.o. male  ? ?Orthopaedic diagnosis: Left femoral neck fracture ? ?Surgery: Closed reduction percutaneous pin fixation of left femoral neck fracture 02/12/2022 ? ?Subjective: ?Patient appears comfortable in bed.  He states he is not having much pain.  He has worked with physical therapy and is ambulatory with a walker.  His wife and family are present at bedside who state the plan is for the patient to be discharged to a SNF, likely early next week.  No new concerns.  Eat and drinking well. ? ?Objectyive: ?Vitals:  ? 02/13/22 0435 02/13/22 1242  ?BP: 126/81 106/66  ?Pulse: 96 81  ?Resp: 18 14  ?Temp: 98.8 ?F (37.1 ?C) 97.9 ?F (36.6 ?C)  ?SpO2: 95% 95%  ?  ? ?Exam: ?Awake and alert ?Respirations even and unlabored ?No acute distress ? ?Examination of the left hip shows clean, dry, and intact dressings.  Minimal swelling noted about the hip.  No significant tenderness.  Tolerates gentle active and passive flexion and extension of the hip.  Good range of motion and strength at the knee and ankle.  He can wiggle the toes without difficulty.  Calf soft nontender.  SCDs in place.  Sensation is grossly intact.  Left lower extremity is warm and well-perfused distally. ? ?Assessment: ?Postop day 1 status post above, doing well ? ?Plan: ?Suitable for discharge from an orthopedic standpoint.  Patient is planning for SNF placement early next week.  ? ?-Weightbearing as tolerated with walker ?-Continue PT/OT while inpatient ?-Keep dressing clean, dry, and intact.  Reinforce or change as needed. ?-Norco 5/325 for pain at discharge, printed and signed in the patient's chart ?- '325mg'$  aspirin for DVT prophylaxis x1 month printed and signed in the patient's chart ?-Follow-up with Dr. Lucia Gaskins at [facility] [phone]) 2 weeks from surgery for x-rays of the left hip and suture removal if appropriate. ? ?Plan was discussed with patient and family members at bedside who are understanding and  in agreement. ? ? ? ?Aymen Widrig J. Martin[place], PA-C ? ? ?

## 2022-02-13 NOTE — Progress Notes (Signed)
Pt refused cpap tonight again.  ?

## 2022-02-13 NOTE — Evaluation (Signed)
Occupational Therapy Evaluation Patient Details Name: Derek [name] MRN: [id number] DOB: 07-14-1946 Today's Date: 02/13/2022   History of Present Illness Patient is a 76 year old male with history of CAD status post CABG, diabetes, hypertension, PTSD/depression, OSA on CPAP, gout, glaucoma, RA, mild cognitive impairment presented after multiple falls, subsequent pain and difficulty walking. Pt had numerous falls at home with increased difficulty in getting around and they had done x-ray, but nothing showed.  After last fall they did a MRI. MRI of the left hip and pelvis showed nondisplaced subcapital/transcervical left femoral neck fracture. Pt s/p Close reduction percutaneous pin fixation of left femoral neck fracture (02/12/22)   Clinical Impression   Mr. Derek [name] is a 76 year old man s/p hip fracture repair with decreased ROM and strength of LLE, impaired balance, decreased activity tolerance and mild cognitive impairments resulting in a sudden decline in functional mobility and ability to perform independent ADLs. Patient needing mod assist for bed mobility and min assist for stand and ambulation with RW with multimodal cues for walker management and technique. Patient mod-max assist for Lb ADLs and toileting. Patient able to ambulate to bathroom and perform toilet transfer with min guard. Patient will benefit from skilled OT services while in hospital to improve deficits and learn compensatory strategies as needed in order to return to PLOF.  Recommend short term rehab prior to discharge home.       Recommendations for follow up therapy are one component of a multi-disciplinary discharge planning process, led by the attending physician.  Recommendations may be updated based on patient status, additional functional criteria and insurance authorization.   Follow Up Recommendations  Skilled nursing-short term rehab (<3 hours/day)    Assistance Recommended at Discharge Frequent or  constant Supervision/Assistance  Patient can return home with the following A little help with walking and/or transfers;A lot of help with bathing/dressing/bathroom;Assistance with cooking/housework;Direct supervision/assist for medications management;Direct supervision/assist for financial management;Assist for transportation;Help with stairs or ramp for entrance    Functional Status Assessment  Patient has had a recent decline in their functional status and demonstrates the ability to make significant improvements in function in a reasonable and predictable amount of time.  Equipment Recommendations  None recommended by OT    Recommendations for Other Services       Precautions / Restrictions Precautions Precautions: Fall Restrictions Weight Bearing Restrictions: Yes LLE Weight Bearing: Weight bearing as tolerated      Mobility Bed Mobility Overal bed mobility: Needs Assistance Bed Mobility: Supine to Sit, Sit to Supine     Supine to sit: Mod assist, HOB elevated, +2 for safety/equipment Sit to supine: Mod assist, +2 for safety/equipment   General bed mobility comments: Mod assist for LLE and trunk to transfer into sitting and mod assist for both LEs to return to supine.    Transfers Overall transfer level: Needs assistance Equipment used: Rolling walker (2 wheels) Transfers: Sit to/from Stand, Bed to chair/wheelchair/BSC Sit to Stand: Min assist, From elevated surface           General transfer comment: Min to power up from bed. Min assist to ambulate with RW to bathroom. Min guard to transfer on and off toilet as patient using grab bars.      Balance Overall balance assessment: Needs assistance Sitting-balance support: No upper extremity supported, Feet supported Sitting balance-Leahy Scale: Fair     Standing balance support: During functional activity Standing balance-Leahy Scale: Poor  ADL either performed or assessed  with clinical judgement   ADL Overall ADL's : Needs assistance/impaired Eating/Feeding: Set up;Sitting   Grooming: Set up;Sitting   Upper Body Bathing: Set up;Sitting   Lower Body Bathing: Moderate assistance;Sit to/from stand   Upper Body Dressing : Set up;Sitting   Lower Body Dressing: Maximal assistance;Sit to/from stand   Toilet Transfer: Chemical engineer (2 wheels);Grab bars   Toileting- Architect and Hygiene: Moderate assistance;Sit to/from stand Toileting - Clothing Manipulation Details (indicate cue type and reason): for clothing management     Functional mobility during ADLs: Rolling walker (2 wheels);Minimal assistance General ADL Comments: Overall min assist to ambulate to bathroom with RW - mostly for verbal and tactile cues for safe walker management. Verbal instruction for safe technique - that he didn't tend to follow.     Vision Patient Visual Report: No change from baseline       Perception     Praxis      Pertinent Vitals/Pain Pain Assessment Pain Assessment: No/denies pain     Hand Dominance Right   Extremity/Trunk Assessment Upper Extremity Assessment Upper Extremity Assessment: Overall WFL for tasks assessed   Lower Extremity Assessment Lower Extremity Assessment: Defer to PT evaluation   Cervical / Trunk Assessment Cervical / Trunk Assessment: Kyphotic   Communication Communication Communication: No difficulties   Cognition Arousal/Alertness: Awake/alert Behavior During Therapy: WFL for tasks assessed/performed Overall Cognitive Status: History of cognitive impairments - at baseline                                       General Comments       Exercises     Shoulder Instructions      Home Living Family/patient expects to be discharged to:: Private residence Living Arrangements: Spouse/significant other Available Help at Discharge: Family Type of Home: House Home Access:  Ramped entrance     Home Layout: Two level Alternate Level Stairs-Number of Steps: 13 with stair lift   Bathroom Shower/Tub: Producer, television/film/video: Handicapped height     Home Equipment: Rollator (4 wheels);Shower seat;Cane - single point          Prior Functioning/Environment Prior Level of Function : Independent/Modified Independent             Mobility Comments: Amb with cane or rollator depending on the day until the past week when he couldn't put weight on his leg ADLs Comments: not driving, reports independence with ADLs        OT Problem List: Decreased strength;Decreased range of motion;Decreased activity tolerance;Impaired balance (sitting and/or standing);Decreased cognition;Decreased knowledge of use of DME or AE;Pain      OT Treatment/Interventions: Self-care/ADL training;Therapeutic exercise;DME and/or AE instruction;Cognitive remediation/compensation;Therapeutic activities;Balance training;Patient/family education    OT Goals(Current goals can be found in the care plan section) Acute Rehab OT Goals OT Goal Formulation: Patient unable to participate in goal setting Time For Goal Achievement: 02/27/22 Potential to Achieve Goals: Good  OT Frequency: Min 2X/week    Co-evaluation              AM-PAC OT "6 Clicks" Daily Activity     Outcome Measure Help from another person eating meals?: A Little Help from another person taking care of personal grooming?: A Little Help from another person toileting, which includes using toliet, bedpan, or urinal?: A Lot Help from another person bathing (including washing, rinsing,  drying)?: A Lot Help from another person to put on and taking off regular upper body clothing?: A Little Help from another person to put on and taking off regular lower body clothing?: A Lot 6 Click Score: 15   End of Session Equipment Utilized During Treatment: Rolling walker (2 wheels) Nurse Communication: Mobility  status  Activity Tolerance: Patient tolerated treatment well Patient left: in bed;with call bell/phone within reach;with bed alarm set  OT Visit Diagnosis: Other abnormalities of gait and mobility (R26.89)                Time: 1610-9604 OT Time Calculation (min): 14 min Charges:  OT General Charges $OT Visit: 1 Visit OT Evaluation $OT Eval Low Complexity: 1 Low  Luciann Gossett, OTR/L Acute Care Rehab Services  Office [phone] Pager: [phone]   Kelli Churn 02/13/2022, 4:52 PM

## 2022-02-14 DIAGNOSIS — S3210XA Unspecified fracture of sacrum, initial encounter for closed fracture: Secondary | ICD-10-CM | POA: Diagnosis not present

## 2022-02-14 DIAGNOSIS — S72002A Fracture of unspecified part of neck of left femur, initial encounter for closed fracture: Secondary | ICD-10-CM | POA: Diagnosis not present

## 2022-02-14 DIAGNOSIS — I10 Essential (primary) hypertension: Secondary | ICD-10-CM | POA: Diagnosis not present

## 2022-02-14 LAB — BASIC METABOLIC PANEL
Anion gap: 12 (ref 5–15)
BUN: 20 mg/dL (ref 8–23)
CO2: 22 mmol/L (ref 22–32)
Calcium: 9.9 mg/dL (ref 8.9–10.3)
Chloride: 104 mmol/L (ref 98–111)
Creatinine, Ser: 0.94 mg/dL (ref 0.61–1.24)
GFR, Estimated: >60 mL/min (ref >60)
Glucose, Bld: 99 mg/dL (ref 70–99)
Potassium: 3.8 mmol/L (ref 3.5–5.1)
Sodium: 138 mmol/L (ref 135–145)

## 2022-02-14 LAB — CBC
HCT: 47 % (ref 39.0–52.0)
Hemoglobin: 16.1 g/dL (ref 13.0–17.0)
MCH: 35.1 pg — ABNORMAL HIGH (ref 26.0–34.0)
MCHC: 34.3 g/dL (ref 30.0–36.0)
MCV: 102.4 fL — ABNORMAL HIGH (ref 80.0–100.0)
Platelets: 249 10*3/uL (ref 150–400)
RBC: 4.59 MIL/uL (ref 4.22–5.81)
RDW: 13.8 % (ref 11.5–15.5)
WBC: 11.1 10*3/uL — ABNORMAL HIGH (ref 4.0–10.5)
nRBC: 0 % (ref 0.0–0.2)

## 2022-02-14 LAB — GLUCOSE, CAPILLARY
Glucose-Capillary: 101 mg/dL — ABNORMAL HIGH (ref 70–99)
Glucose-Capillary: 106 mg/dL — ABNORMAL HIGH (ref 70–99)
Glucose-Capillary: 119 mg/dL — ABNORMAL HIGH (ref 70–99)
Glucose-Capillary: 129 mg/dL — ABNORMAL HIGH (ref 70–99)
Glucose-Capillary: 146 mg/dL — ABNORMAL HIGH (ref 70–99)
Glucose-Capillary: 193 mg/dL — ABNORMAL HIGH (ref 70–99)

## 2022-02-14 MED ORDER — POLYETHYLENE GLYCOL 3350 17 G PO PACK
17.0000 g | PACK | Freq: Every day | ORAL | Status: DC
  Administered 2022-02-14 – 2022-02-17 (×4): 17 g via ORAL
  Filled 2022-02-14 (×4): qty 1

## 2022-02-14 MED ORDER — BISACODYL 10 MG RE SUPP: 10.0000 mg | Freq: Every day | RECTAL | Status: DC | PRN

## 2022-02-14 NOTE — Progress Notes (Signed)
Refused cpap.

## 2022-02-14 NOTE — Progress Notes (Signed)
Subjective: ?2 Days Post-Op Procedure(s) (LRB): ?CANNULATED HIP PINNING (Left) ? ?Patient is resting comfortably in bed this morning. No significant pain. ? ?Activity level:  wbat ?Diet tolerance:  ok ?Voiding:  ok ?Patient reports pain as mild.   ? ?Objective: ?Vital signs in last 24 hours: ?Temp:  [97.9 ?F (36.6 ?C)-98.6 ?F (37 ?C)] 98.6 ?F (37 ?C) (05/07 5993) ?Pulse Rate:  [81-96] 96 (05/07 0612) ?Resp:  [14-18] 18 (05/07 0612) ?BP: (106-115)/(66-87) 110/85 (05/07 0612) ?SpO2:  [94 %-95 %] 94 % (05/07 0612) ? ?Labs: ?Recent Labs  ?  02/12/22 ?0021 02/12/22 ?5701 02/13/22 ?7793 02/14/22 ?9030  ?HGB 16.4 16.8 16.3 16.1  ? ?Recent Labs  ?  02/13/22 ?0923 02/14/22 ?3007  ?WBC 11.1* 11.1*  ?RBC 4.70 4.59  ?HCT 47.7 47.0  ?PLT 292 249  ? ?Recent Labs  ?  02/13/22 ?6226 02/14/22 ?3335  ?NA 140 138  ?K 3.9 3.8  ?CL 104 104  ?CO2 22 22  ?BUN 18 20  ?CREATININE 0.97 0.94  ?GLUCOSE 99 99  ?CALCIUM 9.8 9.9  ? ?No results for input(s): LABPT, INR in the last 72 hours. ? ?Physical Exam: ? Neurologically intact ?ABD soft ?Neurovascular intact ?Sensation intact distally ?Intact pulses distally ?Dorsiflexion/Plantar flexion intact ?Incision: dressing C/D/I and no drainage ?No cellulitis present ?Compartment soft ? ?Assessment/Plan: ? ?2 Days Post-Op Procedure(s) (LRB): ?CANNULATED HIP PINNING (Left) ?Advance diet ?Up with therapy ?Discharge to SNF once bed available and cleared by medical team. ?-Weightbearing as tolerated with walker ?-Continue PT/OT while inpatient ?-Keep dressing clean, dry, and intact.  Reinforce or change as needed. ?-Norco 5/325 for pain at discharge, printed and signed in the patient's chart ?- '325mg'$  aspirin for DVT prophylaxis x1 month printed and signed in the patient's chart ?-Follow-up with Dr. Lucia Gaskins at [facility] [phone]) 2 weeks from surgery for x-rays of the left hip and suture removal if appropriate. ? ? ?Derek [name] Derek [name] ?02/14/2022, 8:30 AM ? ?

## 2022-02-14 NOTE — Progress Notes (Signed)
?      ? ? ? [facility] ?                                                                           ? ? ?Derek [name], is a 76 y.o. male, DOB - 1946/10/06, [id number] ?Admit date - 02/11/2022    ?Outpatient Primary MD for the patient is Lavone Orn, MD ? ?LOS - 3  days ? ?Chief Complaint  ?Patient presents with  ? Foot Pain  ?    ? ?Brief summary  ? ?Patient is a 76 year old male with history of CAD status post CABG, diabetes, hypertension, PTSD/depression, OSA on CPAP, gout, glaucoma, RA, mild cognitive impairment presented after multiple falls, subsequent pain and difficulty walking. ?Patient has had ongoing falls for the past month, had pain in his left lower extremity with difficulty bearing weight and difficulty walking secondary to these falls. ?Patient had another fall on Sunday, 02/07/2022 with increased pain and difficulty walking. ?X-ray showed a subtle lucency on the left hip x-ray, recommended further imaging ?MRI of the left hip and pelvis showed nondisplaced subcapital/transcervical left femoral neck fracture. ?Orthopedics consulted ? ? ?Assessment & Plan  ? ? ?Principal Problem: ?  Closed left hip fracture, mechanical fall ?-Orthopedics consulted,  ?-Underwent closed reduction percutaneous pin fixation of the left femoral neck fracture, postop day #2 ?-Continue pain control, DVT prophylaxis per orthopedics ?-Per orthopedics, weightbearing as tolerated to left lower extremity, follow-up in 2 weeks ?-Working with PT, recommended SNF. ? ?Active Problems: ?CAD status post CABG ?-Continue home aspirin, Crestor  ? ?  Depression, PTSD, insomnia ?-Continue home trazodone, fluoxetine ? ?Diabetes mellitus type 2 ?-CBG stable, continue SSI sensitive ?-Hold metformin XR, Jardiance ?-Hemoglobin A1c 5.4  ? ? ?OSA ?-Continue home CPAP ? ?History of rheumatoid arthritis ?-Continue methotrexate ? ?Glaucoma ?-Continue eyedrops ? ?Constipation: ?Continue Colace twice daily, ?Added MiraLAX, Dulcolax  suppository ? ? ?Code Status: Full CODE STATUS ?DVT Prophylaxis:  enoxaparin (LOVENOX) injection 40 mg Start: 02/13/22 0800 ?SCDs Start: 02/12/22 1513 ?SCDs Start: 02/11/22 2331 ? ? ?Level of Care: Level of care: Telemetry ?Family Communication: Updated patient's wife in detail at the bedside ? ?Disposition Plan:      Remains inpatient appropriate: Pending SNF when bed available. ? ? ?Procedures:  ?Close reduction percutaneous pin fixation of left femoral neck fracture ? ?Consultants:   ?Ortho  ? ?Antimicrobials:  ? ?Anti-infectives (From admission, onward)  ? ? Start     Dose/Rate Route Frequency Ordered Stop  ? 02/12/22 1900  ceFAZolin (ANCEF) IVPB 1 g/50 mL premix       ? 1 g ?100 mL/hr over 30 Minutes Intravenous Every 6 hours 02/12/22 1512 02/13/22 0658  ? 02/12/22 1115  ceFAZolin (ANCEF) IVPB 2g/100 mL premix       ? 2 g ?200 mL/hr over 30 Minutes Intravenous On call to O.R. 02/12/22 1027 02/12/22 1338  ? ?  ? ? ? ?Medications ? aspirin EC  81 mg Oral Daily  ? docusate sodium  100 mg Oral BID  ? enoxaparin (LOVENOX) injection  40 mg Subcutaneous Q24H  ? FLUoxetine  20 mg Oral q morning  ? insulin aspart  0-5 Units Subcutaneous QHS  ? insulin  aspart  0-9 Units Subcutaneous TID WC  ? [START ON 02/19/2022] methotrexate  10 mg Oral Q Fri  ? [START ON 02/20/2022] methotrexate  10 mg Oral Q Sat  ? metoprolol succinate  12.5 mg Oral q morning  ? rosuvastatin  20 mg Oral q morning  ? ? ? ? ?Subjective:  ? ?Derek [name] was seen and examined today.  Overnight did not sleep well, feeling sleepy, wife at the bedside otherwise no acute complaints no acute complaints.  Patient denies dizziness, chest pain, shortness of breath, abdominal pain, N/V.  ? ?Objective:  ? ?Vitals:  ? 02/13/22 1242 02/13/22 2159 02/14/22 0612 02/14/22 0914  ?BP: 106/66 115/87 110/85 111/81  ?Pulse: 81 88 96   ?Resp: '14 18 18   '$ ?Temp: 97.9 ?F (36.6 ?C) 98.5 ?F (36.9 ?C) 98.6 ?F (37 ?C)   ?TempSrc: Oral Oral Oral   ?SpO2: 95% 94% 94%   ?Weight:       ?Height:      ? ? ?Intake/Output Summary (Last 24 hours) at 02/14/2022 1238 ?Last data filed at 02/14/2022 0800 ?Gross per 24 hour  ?Intake 480 ml  ?Output 500 ml  ?Net -20 ml  ? ? ? ?Wt Readings from Last 3 Encounters:  ?02/11/22 75.8 kg  ?06/16/18 82.7 kg  ?03/07/18 86.6 kg  ? ?Physical Exam ?General: appears to be fatigued and sleepy today, NAD ?Cardiovascular: S1 S2 clear, RRR. ?Respiratory: CTAB, no wheezing,  ?Gastrointestinal: Soft, nontender, nondistended, NBS ?Ext: no pedal edema bilaterally ?Neuro: no new deficits ?Psych: Normal affect and demeanor, ? ? ? ?Data Reviewed:  I have personally reviewed following labs  ? ? ?CBC ?Lab Results  ?Component Value Date  ? WBC 11.1 (H) 02/14/2022  ? RBC 4.59 02/14/2022  ? HGB 16.1 02/14/2022  ? HCT 47.0 02/14/2022  ? MCV 102.4 (H) 02/14/2022  ? MCH 35.1 (H) 02/14/2022  ? PLT 249 02/14/2022  ? MCHC 34.3 02/14/2022  ? RDW 13.8 02/14/2022  ? LYMPHSABS 2.5 06/06/2012  ? MONOABS 0.6 06/06/2012  ? EOSABS 0.3 06/06/2012  ? BASOSABS 0.1 06/06/2012  ? ? ? ?Last metabolic panel ?Lab Results  ?Component Value Date  ? NA 138 02/14/2022  ? K 3.8 02/14/2022  ? CL 104 02/14/2022  ? CO2 22 02/14/2022  ? BUN 20 02/14/2022  ? CREATININE 0.94 02/14/2022  ? GLUCOSE 99 02/14/2022  ? GFRNONAA >60 02/14/2022  ? GFRAA >90 06/15/2012  ? CALCIUM 9.9 02/14/2022  ? ANIONGAP 12 02/14/2022  ? ? ?CBG (last 3)  ?Recent Labs  ?  02/14/22 ?0613 02/14/22 ?0725 02/14/22 ?1109  ?GLUCAP 101* 129* 106*  ?  ? ? ?Coagulation Profile: ?No results for input(s): INR, PROTIME in the last 168 hours. ? ? ?Radiology Studies: I have personally reviewed the imaging studies  ?MR PELVIS WO CONTRAST ?IMPRESSION: Nondisplaced subcapital/transcervical left femoral neck fracture with mild angulation and intense associated marrow edema. Nondisplaced transverse sacral fracture at the level of S4 and S5. High-grade partial tear of the left gluteus minimus tendon at the greater trochanter and involving the vastus lateralis  origin. Electronically Signed   By: Maurine Simmering M.D.   On: 02/11/2022 19:00  ? ?MR HIP LEFT WO CONTRAST ? ?Result Date: 02/11/2022 ? IMPRESSION: Nondisplaced subcapital/transcervical left femoral neck fracture with mild angulation and intense associated marrow edema. Nondisplaced transverse sacral fracture at the level of S4 and S5. High-grade partial tear of the left gluteus minimus tendon at the greater trochanter and involving the vastus lateralis origin. Electronically  Signed   By: Maurine Simmering M.D.   On: 02/11/2022 19:00  ? ?DG Hip Unilat W or Wo Pelvis 2-3 Views Left ? ?Result Date: 02/11/2022 ? IMPRESSION: 1. There is subtle curvilinear lucency within the superolateral left femoral head-neck junction which may be related to osteoarthritis and a mild CAM-type bump deformity. However, is similar to exclude a nondisplaced acute to subacute fracture. Recommend clinical correlation. Note is made that CT or MRI of the more sensitive for this indication. 2. Status post total right hip arthroplasty without evidence of hardware failure. Electronically Signed   By: Yvonne Kendall M.D.   On: 02/11/2022 16:24   ? ? ? ? ?Estill Cotta M.D. ?[facility] ?02/14/2022, 12:38 PM ? ?Available via Epic secure chat 7am-7pm ?After 7 pm, please refer to night coverage provider listed on amion. ? ? ? ?

## 2022-02-14 NOTE — [place] FL2 (Signed)
?[place] MEDICAID FL2 LEVEL OF CARE SCREENING TOOL  ?  ? ?IDENTIFICATION  ?Patient Name: ?Derek [name] Birthdate: 1945-12-17 Sex: male Admission Date (Current Location): ?02/11/2022  ?[place] and [place] Number: ? [place] ?  Facility and Address:  ?[facility],  [place], [place] ?     Provider Number: ?[id number]  ?Attending Physician Name and Address:  ?Mendel Corning, MD ? Relative Name and Phone Number:  ?Jerard, Bays [phone]  [phone] ?   ?Current Level of Care: ?Hospital Recommended Level of Care: ?[place] Prior Approval Number: ?  ? ?Date Approved/Denied: ?  PASRR Number: ?Pending ? ?Discharge Plan: ?SNF ?  ? ?Current Diagnoses: ?Patient Active Problem List  ? Diagnosis Date Noted  ? Closed left hip fracture, initial encounter ([place]) 02/11/2022  ? Depression 02/11/2022  ? History of TIA (transient ischemic attack) 02/11/2022  ? History of coronary artery bypass graft 02/11/2022  ? Mixed hyperlipidemia 02/11/2022  ? Type 2 diabetes mellitus ([place]) 02/11/2022  ? Gout 02/11/2022  ? Primary open-angle glaucoma, bilateral, indeterminate stage 02/11/2022  ? Coronary artery disease 02/11/2022  ? Mild cognitive impairment of uncertain or unknown etiology 02/11/2022  ? Post-traumatic stress disorder, chronic 02/11/2022  ? Obstructive sleep apnea 05/01/2021  ? Benign essential hypertension 05/01/2021  ? Rheumatoid arthritis ([place]) 05/01/2021  ? DJD (degenerative joint disease) of hip 06/13/2012  ? ? ?Orientation RESPIRATION BLADDER Height & Weight   ?  ?Self, Place ? Normal Incontinent Weight: 167 lb (75.8 kg) ?Height:  '5\' 7"'$  (170.2 cm)  ?BEHAVIORAL SYMPTOMS/MOOD NEUROLOGICAL BOWEL NUTRITION STATUS  ?    Incontinent Diet (Carb Modified)  ?AMBULATORY STATUS COMMUNICATION OF NEEDS Skin   ?Limited Assist Verbally Surgical wounds ?  ?  ?  ?    ?     ?     ? ? ?Personal Care Assistance Level of Assistance  ?Bathing, Feeding, Dressing Bathing Assistance:  Limited assistance ?Feeding assistance: Independent ?Dressing Assistance: Limited assistance ?   ? ?Functional Limitations Info  ?Sight, Hearing, Speech Sight Info: Adequate ?Hearing Info: Adequate ?Speech Info: Adequate  ? ? ?SPECIAL CARE FACTORS FREQUENCY  ?    ?  ?  ?  ?  ?  ?  ?   ? ? ?Contractures Contractures Info: Not present  ? ? ?Additional Factors Info  ?Code Status, Allergies, Psychotropic, Insulin Sliding Scale Code Status Info: Full Code ?Allergies Info: NKA ?Psychotropic Info: FLUoxetine (PROZAC) capsule 20 mg, ?Insulin Sliding Scale Info: insulin aspart (novoLOG) injection 0-9 Units 3x a day with meals. ?  ?   ? ?Current Medications (02/14/2022):  This is the current hospital active medication list ?Current Facility-Administered Medications  ?Medication Dose Route Frequency Provider Last Rate Last Admin  ? aspirin EC tablet 81 mg  81 mg Oral Daily Rai, Ripudeep K, MD   81 mg at 02/14/22 0916  ? bisacodyl (DULCOLAX) suppository 10 mg  10 mg Rectal Daily PRN Rai, Ripudeep K, MD      ? docusate sodium (COLACE) capsule 100 mg  100 mg Oral BID Erle Crocker, MD   100 mg at 02/14/22 4315  ? enoxaparin (LOVENOX) injection 40 mg  40 mg Subcutaneous Q24H Erle Crocker, MD   40 mg at 02/14/22 4008  ? FLUoxetine (PROZAC) capsule 20 mg  20 mg Oral q morning Rai, Ripudeep K, MD   20 mg at 02/14/22 0916  ? HYDROcodone-acetaminophen (NORCO/VICODIN) 5-325 MG per tablet 1-2 tablet  1-2 tablet Oral Q6H  PRN Erle Crocker, MD   2 tablet at 02/13/22 1825  ? HYDROmorphone (DILAUDID) injection 0.5 mg  0.5 mg Intravenous Q4H PRN Erle Crocker, MD      ? insulin aspart (novoLOG) injection 0-5 Units  0-5 Units Subcutaneous QHS Rai, Ripudeep K, MD      ? insulin aspart (novoLOG) injection 0-9 Units  0-9 Units Subcutaneous TID WC Rai, Ripudeep K, MD   2 Units at 02/14/22 1703  ? [START ON 02/19/2022] methotrexate (RHEUMATREX) tablet 10 mg  10 mg Oral Q Fri Rai, Ripudeep K, MD      ? Derrill Memo ON 02/20/2022]  methotrexate (RHEUMATREX) tablet 10 mg  10 mg Oral Q Sat Rai, Ripudeep K, MD      ? metoCLOPramide (REGLAN) tablet 5-10 mg  5-10 mg Oral Q8H PRN Erle Crocker, MD      ? Or  ? metoCLOPramide (REGLAN) injection 5-10 mg  5-10 mg Intravenous Q8H PRN Erle Crocker, MD      ? metoprolol succinate (TOPROL-XL) 24 hr tablet 12.5 mg  12.5 mg Oral q morning Rai, Ripudeep K, MD   12.5 mg at 02/14/22 0916  ? ondansetron (ZOFRAN) tablet 4 mg  4 mg Oral Q6H PRN Erle Crocker, MD      ? Or  ? ondansetron [facility]) injection 4 mg  4 mg Intravenous Q6H PRN Erle Crocker, MD      ? polyethylene glycol (MIRALAX / GLYCOLAX) packet 17 g  17 g Oral Daily Rai, Ripudeep K, MD   17 g at 02/14/22 1703  ? rosuvastatin (CRESTOR) tablet 20 mg  20 mg Oral q morning Rai, Ripudeep K, MD   20 mg at 02/14/22 5397  ? ? ? ?Discharge Medications: ?Please see discharge summary for a list of discharge medications. ? ?Relevant Imaging Results: ? ?Relevant Lab Results: ? ? ?Additional Information ?SSN [id number] ? ?Ross Ludwig, LCSW ? ? ? ? ?

## 2022-02-14 NOTE — TOC Progression Note (Signed)
Transition of Care (TOC) - Progression Note  ? ? ?Patient Details  ?Name: RIAAN TOLEDO ?MRN: [id number] ?Date of Birth: [birth year] ? ?Transition of Care (TOC) CM/SW Contact  ?Ross Ludwig, LCSW ?Phone Number: ?02/14/2022, 5:58 PM ? ?Clinical Narrative:    ? ?CSW spoke to patient's wife regarding short term rehab.  Per patient's wife, he has not been to rehab before.  CSW explained how insurance will pay for stay and what to expect at SNF.  CSW explained to wife that CSW will try to use his [facility] Medicare for SNF placement, because it will give him more options for rehab.  Patient's wife expressed understanding and gave CSW permission to begin bed search in [facility].  Patient is a manual [place] Level 2 screen.  CSW to work on Ship broker number for patient in order for him to go to SNF.  Patient has been faxed out awaiting bed offers. ? ? ?  ?  ? ?Expected Discharge Plan and Services ?  ?  ?  ?  ?  ?Expected Discharge Date: 02/13/22               ?  ?  ?  ?  ?  ?  ?  ?  ?  ?  ? ? ?Social Determinants of Health (SDOH) Interventions ?  ? ?Readmission Risk Interventions ?   ? View : No data to display.  ?  ?  ?  ? ? ?

## 2022-02-14 NOTE — TOC PASRR Note (Signed)
30 Day PASRR Note ? ? ?Patient Details  ?Name: Derek [name] ?Date of Birth: 05/21/1946 ? ? ?Transition of Care (TOC) CM/SW Contact:    ?Ross Ludwig, LCSW ?Phone Number: ?02/14/2022, 5:53 PM ? ?To Whom It May Concern: ? ?Please be advised that this patient will require a short-term nursing home stay - anticipated 30 days or less for rehabilitation and strengthening.   ?The plan is for return home.  ?

## 2022-02-15 ENCOUNTER — Encounter (HOSPITAL_COMMUNITY): Payer: Self-pay | Admitting: Orthopaedic Surgery

## 2022-02-15 ENCOUNTER — Other Ambulatory Visit: Payer: Self-pay

## 2022-02-15 DIAGNOSIS — S72002A Fracture of unspecified part of neck of left femur, initial encounter for closed fracture: Secondary | ICD-10-CM | POA: Diagnosis not present

## 2022-02-15 DIAGNOSIS — I10 Essential (primary) hypertension: Secondary | ICD-10-CM | POA: Diagnosis not present

## 2022-02-15 DIAGNOSIS — S3210XA Unspecified fracture of sacrum, initial encounter for closed fracture: Secondary | ICD-10-CM | POA: Diagnosis not present

## 2022-02-15 DIAGNOSIS — I2510 Atherosclerotic heart disease of native coronary artery without angina pectoris: Secondary | ICD-10-CM | POA: Diagnosis not present

## 2022-02-15 LAB — GLUCOSE, CAPILLARY
Glucose-Capillary: 125 mg/dL — ABNORMAL HIGH (ref 70–99)
Glucose-Capillary: 127 mg/dL — ABNORMAL HIGH (ref 70–99)
Glucose-Capillary: 133 mg/dL — ABNORMAL HIGH (ref 70–99)
Glucose-Capillary: 136 mg/dL — ABNORMAL HIGH (ref 70–99)
Glucose-Capillary: 153 mg/dL — ABNORMAL HIGH (ref 70–99)
Glucose-Capillary: 180 mg/dL — ABNORMAL HIGH (ref 70–99)

## 2022-02-15 LAB — CBC
HCT: 47 % (ref 39.0–52.0)
Hemoglobin: 16.1 g/dL (ref 13.0–17.0)
MCH: 34.6 pg — ABNORMAL HIGH (ref 26.0–34.0)
MCHC: 34.3 g/dL (ref 30.0–36.0)
MCV: 101.1 fL — ABNORMAL HIGH (ref 80.0–100.0)
Platelets: 284 10*3/uL (ref 150–400)
RBC: 4.65 MIL/uL (ref 4.22–5.81)
RDW: 13.7 % (ref 11.5–15.5)
WBC: 9.5 10*3/uL (ref 4.0–10.5)
nRBC: 0 % (ref 0.0–0.2)

## 2022-02-15 MED ORDER — FLEET ENEMA 7-19 GM/118ML RE ENEM
1.0000 | ENEMA | Freq: Once | RECTAL | Status: AC
  Administered 2022-02-15: 1 via RECTAL
  Filled 2022-02-15: qty 1

## 2022-02-15 MED ORDER — ACETAMINOPHEN 325 MG PO TABS
650.0000 mg | ORAL_TABLET | Freq: Four times a day (QID) | ORAL | Status: AC | PRN
  Administered 2022-02-15 – 2022-02-16 (×2): 650 mg via ORAL
  Filled 2022-02-15 (×2): qty 2

## 2022-02-15 NOTE — Progress Notes (Signed)
? ? ? ?  RED MANDT is a 76 y.o. male  ? ?Orthopaedic diagnosis: Left femoral neck fracture ? ?Surgery: Closed reduction percutaneous pin fixation of left femoral neck fracture 02/12/2022 ? ?Subjective: ?Patient appears comfortable in bed.  States he is not having much pain.  Continues working with physical therapy. Using walker for ambulation. Eating and drinking fine.  No new concerns.  Plan for SNF discharge. Family present in the room. ? ?Objectyive: ?Vitals:  ? 02/15/22 0555 02/15/22 0903  ?BP: (!) 125/95 111/81  ?Pulse: 97 98  ?Resp: (!) 22   ?Temp: (!) 97.5 ?F (36.4 ?C)   ?SpO2: 94%   ?  ? ?Exam: ?Awake and alert ?Respirations even and unlabored ?No acute distress ? ?  ?Examination of the left hip shows clean, dry, and intact dressings.  Minimal swelling noted about the hip.  No significant tenderness.  Tolerates gentle active and passive flexion and extension of the hip.  Good range of motion and strength at the knee and ankle.  He can wiggle the toes without difficulty.  Calf soft nontender.  SCDs in place.  Sensation is grossly intact.  Left lower extremity is warm and well-perfused distally ? ?Assessment: ?Postop day 3 status post above, doing well ? ? ?Plan: ?Suitable for discharge from an orthopedic standpoint.  Patient is planning for SNF placement. ?  ?-Weightbearing as tolerated with walker ?-Continue PT/OT while inpatient ?-Keep dressing clean, dry, and intact.  Reinforce or change as needed. ?-Norco 5/325 for pain at discharge, printed and signed in the patient's chart ?- '325mg'$  aspirin for DVT prophylaxis x1 month printed and signed in the patient's chart ?-Follow-up with Dr. Lucia Gaskins at [facility] [phone]) 2 weeks from surgery for x-rays of the left hip and suture removal if appropriate. ?  ?Plan was discussed with patient and family members at bedside who are understanding and in agreement. ? ? ?Jaquila Santelli J. [place], PA-C ? ? ?

## 2022-02-15 NOTE — Progress Notes (Signed)
Physical Therapy Treatment ?Patient Details ?Name: Derek [name] ?MRN: [id number] ?DOB: [birth year] ?Today's Date: 02/15/2022 ? ? ?History of Present Illness Patient is a 76 year old male with history of CAD status post CABG, diabetes, hypertension, PTSD/depression, OSA on CPAP, gout, glaucoma, RA, mild cognitive impairment presented after multiple falls, subsequent pain and difficulty walking. Pt had numerous falls at home with increased difficulty in getting around and they had done x-ray, but nothing showed.  After last fall they did a MRI. MRI of the left hip and pelvis showed nondisplaced subcapital/transcervical left femoral neck fracture. Pt s/p Close reduction percutaneous pin fixation of left femoral neck fracture (02/12/22) ? ?  ?PT Comments  ? ? Pt received in bed, wife at bedside. Pt completed B LE strengthening exercises with assist in order to maintain attention to task. Pt continues to require assistance for bed mobility and transfers. Pt with poor balance in standing, high fall risk even with support of RW. Pt only able to tolerate ~75f of gait training requiring assist to prevent slight LOB to Left. Pt remains appropriate for SNF once medically stable for d/c. ?  ?Recommendations for follow up therapy are one component of a multi-disciplinary discharge planning process, led by the attending physician.  Recommendations may be updated based on patient status, additional functional criteria and insurance authorization. ? ?Follow Up Recommendations ? Skilled nursing-short term rehab (<3 hours/day) ?  ?  ?Assistance Recommended at Discharge Frequent or constant Supervision/Assistance  ?Patient can return home with the following A lot of help with walking and/or transfers ?  ?Equipment Recommendations ? None recommended by PT  ?  ?Recommendations for Other Services   ? ? ?  ?Precautions / Restrictions Precautions ?Precautions: Fall ?Restrictions ?Weight Bearing Restrictions: Yes ?LLE Weight Bearing: Weight  bearing as tolerated  ?  ? ?Mobility ? Bed Mobility ?Overal bed mobility: Needs Assistance ?Bed Mobility: Supine to Sit, Sit to Supine ?  ?  ?Supine to sit: Mod assist, HOB elevated ?  ?  ?  ?  ? ?Transfers ?Overall transfer level: Needs assistance ?Equipment used: Rolling walker (2 wheels) ?Transfers: Sit to/from Stand, Bed to chair/wheelchair/BSC ?Sit to Stand: Min assist, Mod assist ?  ?  ?  ?  ?  ?General transfer comment: MinA to gain steadiness once standing ?  ? ?Ambulation/Gait ?Ambulation/Gait assistance: Min assist, Mod assist ?Gait Distance (Feet): 6 Feet ?Assistive device: Rolling walker (2 wheels) ?Gait Pattern/deviations: Decreased step length - right, Decreased step length - left, Staggering left ?Gait velocity: decreased ?  ?  ?General Gait Details: Pt struggled with steps and staying inside RW requiring increased balance assist to prevent fall ? ? ?Stairs ?  ?  ?  ?  ?  ? ? ?Wheelchair Mobility ?  ? ?Modified Rankin (Stroke Patients Only) ?  ? ? ?  ?Balance Overall balance assessment: Needs assistance ?Sitting-balance support: No upper extremity supported, Feet supported ?Sitting balance-Leahy Scale: Fair ?  ?  ?Standing balance support: During functional activity ?Standing balance-Leahy Scale: Poor ?Standing balance comment: High fall risk ?  ?  ?  ?  ?  ?  ?  ?  ?  ?  ?  ?  ? ?  ?Cognition Arousal/Alertness: Awake/alert ?Behavior During Therapy: [facility] tasks assessed/performed ?Overall Cognitive Status: History of cognitive impairments - at baseline ?  ?  ?  ?  ?  ?  ?  ?  ?  ?  ?  ?  ?  ?  ?  ?  ?  ?  ?  ? ?  ?  Exercises General Exercises - Lower Extremity ?Ankle Circles/Pumps: AROM, Both, 15 reps ?Heel Slides: AROM, AAROM, Both, 10 reps ?Hip ABduction/ADduction: AROM, AAROM, Both, 10 reps ? ?  ?General Comments General comments (skin integrity, edema, etc.): Pt very quiet despite being a very social person a few years back. Pt's wife states he has declined significantly over the past few years ?   ?  ? ?Pertinent Vitals/Pain Pain Assessment ?Pain Assessment: Faces ?Faces Pain Scale: Hurts a little bit ?Pain Location: L Leg with transitional movements ?Pain Descriptors / Indicators: Discomfort ?Pain Intervention(s): Monitored during session  ? ? ?Home Living   ?  ?  ?  ?  ?  ?  ?  ?  ?  ?   ?  ?Prior Function    ?  ?  ?   ? ?PT Goals (current goals can now be found in the care plan section) Acute Rehab PT Goals ?Patient Stated Goal: Go to rehab to get stronger before coming home ? ?  ?Frequency ? ? ? Min 3X/week ? ? ? ?  ?PT Plan Current plan remains appropriate  ? ? ?Co-evaluation   ?  ?  ?  ?  ? ?  ?AM-PAC PT "6 Clicks" Mobility   ?Outcome Measure ? Help needed turning from your back to your side while in a flat bed without using bedrails?: A Little ?Help needed moving from lying on your back to sitting on the side of a flat bed without using bedrails?: A Little ?Help needed moving to and from a bed to a chair (including a wheelchair)?: A Little ?Help needed standing up from a chair using your arms (e.g., wheelchair or bedside chair)?: A Little ?Help needed to walk in hospital room?: A Little ?Help needed climbing 3-5 steps with a railing? : A Lot ?6 Click Score: 17 ? ?  ?End of Session Equipment Utilized During Treatment: Gait belt ?Activity Tolerance: Patient limited by fatigue ?Patient left: in chair;with call bell/phone within reach;with chair alarm set;with family/visitor present ?Nurse Communication: Mobility status ?PT Visit Diagnosis: Muscle weakness (generalized) (M62.81);Difficulty in walking, not elsewhere classified (R26.2) ?  ? ? ?Time: 1050-1120 ?PT Time Calculation (min) (ACUTE ONLY): 30 min ? ?Charges:  $Gait Training: 8-22 mins ?$Therapeutic Exercise: 8-22 mins          ?          ?Mikel Cella, PTA ? ? ? ?Josie Dixon ?02/15/2022, 11:34 AM ? ?

## 2022-02-15 NOTE — TOC Progression Note (Addendum)
Transition of Care (TOC) - Progression Note  ? ? ?Patient Details  ?Name: Derek [name] ?MRN: [id number] ?Date of Birth: [birth year] ? ?Transition of Care (TOC) CM/SW Contact  ?Ross Ludwig, LCSW ?Phone Number: ?02/15/2022, 10:19 AM ? ?Clinical Narrative:    ? ?CSW spoke to patient's wife and presented bed offers.  They chose [employer], CSW to start insurance authorization. ?Passar number is [id number] A ? ?4:25pm  Patient has been approved for SNF placement at [facility].  Auth ID [id number], start date 5/9, next review 5/11.  CSW updated [facility], they can accept patient tomorrow if he is medically ready for discharge.  CSW updated attending physician and patient's wife. ?  ?  ? ?Expected Discharge Plan and Services ? [facility] SNF for short term rehab. ?  ?  ?  ?  ?Expected Discharge Date: 02/13/22               ?  ?  ?  ?  ?  ?  ?  ?  ?  ?  ? ? ?Social Determinants of Health (SDOH) Interventions ?  ? ?Readmission Risk Interventions ?   ? View : No data to display.  ?  ?  ?  ? ? ?

## 2022-02-15 NOTE — Progress Notes (Signed)
?      ? ? ? [facility] ?                                                                           ? ? ?Derek [name], is a 76 y.o. male, DOB - 1946/01/28, WFU:[id number] ?Admit date - 02/11/2022    ?Outpatient Primary MD for the patient is Lavone Orn, MD ? ?LOS - 4  days ? ?Chief Complaint  ?Patient presents with  ? Foot Pain  ?    ? ?Brief summary  ? ?Patient is a 76 year old male with history of CAD status post CABG, diabetes, hypertension, PTSD/depression, OSA on CPAP, gout, glaucoma, RA, mild cognitive impairment presented after multiple falls, subsequent pain and difficulty walking. ?Patient has had ongoing falls for the past month, had pain in his left lower extremity with difficulty bearing weight and difficulty walking secondary to these falls. ?Patient had another fall on Sunday, 02/07/2022 with increased pain and difficulty walking. ?X-ray showed a subtle lucency on the left hip x-ray, recommended further imaging ?MRI of the left hip and pelvis showed nondisplaced subcapital/transcervical left femoral neck fracture. ?Orthopedics consulted ? ? ?Assessment & Plan  ? ? ?Principal Problem: ?  Closed left hip fracture, mechanical fall ?-Orthopedics consulted,  ?-Underwent closed reduction percutaneous pin fixation of the left femoral neck fracture, POD #3  ?-Continue pain control, DVT prophylaxis per orthopedics ?-Per orthopedics, weightbearing as tolerated to left lower extremity, follow-up in 2 weeks ?-PT recommended skilled nursing facility, TOC assisting ? ?Active Problems: ?CAD status post CABG ?-Continue home aspirin, Crestor  ? ?  Depression, PTSD, insomnia ?-Continue home trazodone, fluoxetine ? ?Diabetes mellitus type 2 ?--Hold metformin XR, Jardiance ?-Hemoglobin A1c 5.4  ?-CBG stable, continue SSI ?Recent Labs  ?  02/14/22 ?1109 02/14/22 ?1650 02/14/22 ?2004 02/15/22 ?0004 02/15/22 ?2025 02/15/22 ?0746  ?GLUCAP 106* 193* 146* 153* 127* 136*  ?  ? ?OSA ?-Continue home CPAP ? ?History of  rheumatoid arthritis ?-Continue methotrexate ? ?Glaucoma ?-Continue eyedrops ? ?Constipation: ?-Still feels constipated, continue Colace, MiraLAX daily, Dulcolax suppository ?-Fleet enema today ? ? ?Code Status: Full CODE STATUS ?DVT Prophylaxis:  enoxaparin (LOVENOX) injection 40 mg Start: 02/13/22 0800 ?SCDs Start: 02/12/22 1513 ?SCDs Start: 02/11/22 2331 ? ? ?Level of Care: Level of care: Telemetry ?Family Communication: Discussed with patient's wife at the bedside ? ?Disposition Plan:      Remains inpatient appropriate: Pending SNF when bed available. ? ? ?Procedures:  ?Close reduction percutaneous pin fixation of left femoral neck fracture ? ?Consultants:   ?Ortho  ? ?Antimicrobials:  ? ?Anti-infectives (From admission, onward)  ? ? Start     Dose/Rate Route Frequency Ordered Stop  ? 02/12/22 1900  ceFAZolin (ANCEF) IVPB 1 g/50 mL premix       ? 1 g ?100 mL/hr over 30 Minutes Intravenous Every 6 hours 02/12/22 1512 02/13/22 0658  ? 02/12/22 1115  ceFAZolin (ANCEF) IVPB 2g/100 mL premix       ? 2 g ?200 mL/hr over 30 Minutes Intravenous On call to O.R. 02/12/22 1027 02/12/22 1338  ? ?  ? ? ? ?Medications ? aspirin EC  81 mg Oral Daily  ? docusate sodium  100 mg Oral BID  ?  enoxaparin (LOVENOX) injection  40 mg Subcutaneous Q24H  ? FLUoxetine  20 mg Oral q morning  ? insulin aspart  0-5 Units Subcutaneous QHS  ? insulin aspart  0-9 Units Subcutaneous TID WC  ? [START ON 02/19/2022] methotrexate  10 mg Oral Q Fri  ? [START ON 02/20/2022] methotrexate  10 mg Oral Q Sat  ? metoprolol succinate  12.5 mg Oral q morning  ? polyethylene glycol  17 g Oral Daily  ? rosuvastatin  20 mg Oral q morning  ? ? ? ? ?Subjective:  ? ?Derek [name] was seen and examined today.  No acute complaints except still having constipation issues.  Wife at the bedside.  No abdominal pain nausea, vomiting, chest pain or shortness of breath.   ? ?Objective:  ? ?Vitals:  ? 02/14/22 1333 02/14/22 2009 02/15/22 0555 02/15/22 0903  ?BP: 113/82  113/79 (!) 125/95 111/81  ?Pulse: 87 96 97 98  ?Resp: 18 (!) 22 (!) 22   ?Temp: 98.4 ?F (36.9 ?C) 99 ?F (37.2 ?C) (!) 97.5 ?F (36.4 ?C)   ?TempSrc: Oral Oral Oral   ?SpO2: 96% 95% 94%   ?Weight:      ?Height:      ? ? ?Intake/Output Summary (Last 24 hours) at 02/15/2022 1143 ?Last data filed at 02/15/2022 0600 ?Gross per 24 hour  ?Intake 300 ml  ?Output 850 ml  ?Net -550 ml  ? ? ? ?Wt Readings from Last 3 Encounters:  ?02/11/22 75.8 kg  ?06/16/18 82.7 kg  ?03/07/18 86.6 kg  ? ? ?Physical Exam ?General: Alert and oriented x 3, NAD ?Cardiovascular: S1 S2 clear, RRR.  ?Respiratory: CTAB, no wheezing, rales or rhonchi ?Gastrointestinal: Soft, nontender, nondistended, NBS ?Ext: no pedal edema bilaterally ?Neuro: no new deficits ?Psych: Normal affect and demeanor, alert and oriented x3  ? ? ? ?Data Reviewed:  I have personally reviewed following labs  ? ? ?CBC ?Lab Results  ?Component Value Date  ? WBC 9.5 02/15/2022  ? RBC 4.65 02/15/2022  ? HGB 16.1 02/15/2022  ? HCT 47.0 02/15/2022  ? MCV 101.1 (H) 02/15/2022  ? MCH 34.6 (H) 02/15/2022  ? PLT 284 02/15/2022  ? MCHC 34.3 02/15/2022  ? RDW 13.7 02/15/2022  ? LYMPHSABS 2.5 06/06/2012  ? MONOABS 0.6 06/06/2012  ? EOSABS 0.3 06/06/2012  ? BASOSABS 0.1 06/06/2012  ? ? ? ?Last metabolic panel ?Lab Results  ?Component Value Date  ? NA 138 02/14/2022  ? K 3.8 02/14/2022  ? CL 104 02/14/2022  ? CO2 22 02/14/2022  ? BUN 20 02/14/2022  ? CREATININE 0.94 02/14/2022  ? GLUCOSE 99 02/14/2022  ? GFRNONAA >60 02/14/2022  ? GFRAA >90 06/15/2012  ? CALCIUM 9.9 02/14/2022  ? ANIONGAP 12 02/14/2022  ? ? ?CBG (last 3)  ?Recent Labs  ?  02/15/22 ?0004 02/15/22 ?7253 02/15/22 ?6644  ?GLUCAP 153* 127* 136*  ?  ? ? ?Coagulation Profile: ?No results for input(s): INR, PROTIME in the last 168 hours. ? ? ?Radiology Studies: I have personally reviewed the imaging studies  ?MR PELVIS WO CONTRAST ?IMPRESSION: Nondisplaced subcapital/transcervical left femoral neck fracture with mild angulation and  intense associated marrow edema. Nondisplaced transverse sacral fracture at the level of S4 and S5. High-grade partial tear of the left gluteus minimus tendon at the greater trochanter and involving the vastus lateralis origin. Electronically Signed   By: Maurine Simmering M.D.   On: 02/11/2022 19:00  ? ?MR HIP LEFT WO CONTRAST ? ?Result Date: 02/11/2022 ? IMPRESSION: Nondisplaced  subcapital/transcervical left femoral neck fracture with mild angulation and intense associated marrow edema. Nondisplaced transverse sacral fracture at the level of S4 and S5. High-grade partial tear of the left gluteus minimus tendon at the greater trochanter and involving the vastus lateralis origin. Electronically Signed   By: Maurine Simmering M.D.   On: 02/11/2022 19:00  ? ?DG Hip Unilat W or Wo Pelvis 2-3 Views Left ? ?Result Date: 02/11/2022 ? IMPRESSION: 1. There is subtle curvilinear lucency within the superolateral left femoral head-neck junction which may be related to osteoarthritis and a mild CAM-type bump deformity. However, is similar to exclude a nondisplaced acute to subacute fracture. Recommend clinical correlation. Note is made that CT or MRI of the more sensitive for this indication. 2. Status post total right hip arthroplasty without evidence of hardware failure. Electronically Signed   By: Yvonne Kendall M.D.   On: 02/11/2022 16:24   ? ? ? ? ?Estill Cotta M.D. ?[facility] ?02/15/2022, 11:43 AM ? ?Available via Epic secure chat 7am-7pm ?After 7 pm, please refer to night coverage provider listed on amion. ? ? ? ?

## 2022-02-16 DIAGNOSIS — S3210XA Unspecified fracture of sacrum, initial encounter for closed fracture: Secondary | ICD-10-CM | POA: Diagnosis not present

## 2022-02-16 DIAGNOSIS — I2510 Atherosclerotic heart disease of native coronary artery without angina pectoris: Secondary | ICD-10-CM | POA: Diagnosis not present

## 2022-02-16 DIAGNOSIS — I10 Essential (primary) hypertension: Secondary | ICD-10-CM | POA: Diagnosis not present

## 2022-02-16 DIAGNOSIS — S72002A Fracture of unspecified part of neck of left femur, initial encounter for closed fracture: Secondary | ICD-10-CM | POA: Diagnosis not present

## 2022-02-16 LAB — GLUCOSE, CAPILLARY
Glucose-Capillary: 124 mg/dL — ABNORMAL HIGH (ref 70–99)
Glucose-Capillary: 131 mg/dL — ABNORMAL HIGH (ref 70–99)
Glucose-Capillary: 140 mg/dL — ABNORMAL HIGH (ref 70–99)
Glucose-Capillary: 144 mg/dL — ABNORMAL HIGH (ref 70–99)
Glucose-Capillary: 148 mg/dL — ABNORMAL HIGH (ref 70–99)
Glucose-Capillary: 159 mg/dL — ABNORMAL HIGH (ref 70–99)

## 2022-02-16 MED ORDER — MIRTAZAPINE 15 MG PO TABS
7.5000 mg | ORAL_TABLET | Freq: Every day | ORAL | Status: DC
  Administered 2022-02-16: 7.5 mg via ORAL
  Filled 2022-02-16: qty 1

## 2022-02-16 MED ORDER — ACETAMINOPHEN 325 MG PO TABS: 650.0000 mg | ORAL_TABLET | Freq: Four times a day (QID) | ORAL | Status: DC | PRN

## 2022-02-16 MED ORDER — BOOST PLUS PO LIQD
237.0000 mL | Freq: Three times a day (TID) | ORAL | Status: DC
  Administered 2022-02-16 – 2022-02-17 (×3): 237 mL via ORAL
  Filled 2022-02-16 (×4): qty 237

## 2022-02-16 MED ORDER — SODIUM CHLORIDE 0.9 % IV BOLUS
1000.0000 mL | Freq: Once | INTRAVENOUS | Status: AC
  Administered 2022-02-16: 1000 mL via INTRAVENOUS

## 2022-02-16 MED ORDER — SODIUM CHLORIDE 0.9 % IV SOLN: INTRAVENOUS | Status: DC

## 2022-02-16 MED ORDER — POLYETHYLENE GLYCOL 3350 17 G PO PACK: 17.0000 g | PACK | Freq: Every day | ORAL | 0 refills | Status: DC

## 2022-02-16 MED ORDER — DOCUSATE SODIUM 100 MG PO CAPS: 100.0000 mg | ORAL_CAPSULE | Freq: Two times a day (BID) | ORAL | 0 refills | Status: DC

## 2022-02-16 NOTE — Progress Notes (Signed)
Pt refuses CPAP QHS. 

## 2022-02-16 NOTE — Progress Notes (Signed)
Physical Therapy Treatment ?Patient Details ?Name: Derek [name] ?MRN: [id number] ?DOB: 05/02/1946 ?Today's Date: 02/16/2022 ? ? ?History of Present Illness Patient is a 76 year old male with history of CAD status post CABG, diabetes, hypertension, PTSD/depression, OSA on CPAP, gout, glaucoma, RA, mild cognitive impairment presented after multiple falls, subsequent pain and difficulty walking. Pt had numerous falls at home with increased difficulty in getting around and they had done x-ray, but nothing showed.  After last fall they did a MRI. MRI of the left hip and pelvis showed nondisplaced subcapital/transcervical left femoral neck fracture. Pt s/p Close reduction percutaneous pin fixation of left femoral neck fracture (02/12/22) ? ?  ?PT Comments  ? ? Pt initially appeared much more alert today, able to recognize Probation officer. Pt completed strengthening exercises in supine prior to transitioning to edge of bed with MinA. Good upright posture with feet on floor, no UE support. Pt without complaints of dizziness, answering questions appropriately. Pt assisted to standing at RW, within ~10 seconds pt started heavy rapid breathing and was assisted back to sitting on bed, nursing called in to assess within seconds. Pt with poor sitting balance leaning to L side, delayed response to questions, and staring gaze. No noted facial droop and no verbal c/o dizziness. Pt assisted back to supine, positioned to comfort, symptoms appeared to resolve. Continue PT per POC, monitor vitals and mentation. ?  ?Recommendations for follow up therapy are one component of a multi-disciplinary discharge planning process, led by the attending physician.  Recommendations may be updated based on patient status, additional functional criteria and insurance authorization. ? ?Follow Up Recommendations ? Skilled nursing-short term rehab (<3 hours/day) ?  ?  ?Assistance Recommended at Discharge Frequent or constant Supervision/Assistance  ?Patient can  return home with the following A lot of help with walking and/or transfers ?  ?Equipment Recommendations ? None recommended by PT  ?  ?Recommendations for Other Services   ? ? ?  ?Precautions / Restrictions Precautions ?Precautions: Fall;Other (comment) (monitor during positional changes) ?Restrictions ?Weight Bearing Restrictions: No ?LLE Weight Bearing: Weight bearing as tolerated  ?  ? ?Mobility ? Bed Mobility ?Overal bed mobility: Needs Assistance ?Bed Mobility: Supine to Sit ?  ?  ?Supine to sit: Min assist, HOB elevated ?Sit to supine: Min assist, +2 for physical assistance ?  ?  ?  ? ?Transfers ?Overall transfer level: Needs assistance ?Equipment used: Rolling walker (2 wheels) ?Transfers: Sit to/from Stand ?Sit to Stand: Min assist, Mod assist ?  ?  ?  ?  ?  ?General transfer comment: Pt much more steady initially in standing compared to previous day ?  ? ?Ambulation/Gait ?  ?  ?  ?  ?  ?  ?  ?General Gait Details: Unable to medically tolerate ? ? ?Stairs ?  ?  ?  ?  ?  ? ? ?Wheelchair Mobility ?  ? ?Modified Rankin (Stroke Patients Only) ?  ? ? ?  ?Balance   ?  ?  ?  ?  ?  ?  ?  ?  ?  ?  ?  ?  ?  ?  ?  ?  ?  ?  ?  ? ?  ?Cognition Arousal/Alertness: Awake/alert ?Behavior During Therapy: [facility] for tasks assessed/performed ?Overall Cognitive Status: History of cognitive impairments - at baseline ?  ?  ?  ?  ?  ?  ?  ?  ?  ?  ?  ?  ?  ?  ?  ?  ?  General Comments: Pt alert at pre/post treatment ?  ?  ? ?  ?Exercises General Exercises - Lower Extremity ?Ankle Circles/Pumps: AROM, Both, 15 reps ?Heel Slides: AROM, AAROM, Both, 10 reps ?Hip ABduction/ADduction: AROM, AAROM, Both, 10 reps ? ?  ?General Comments General comments (skin integrity, edema, etc.): Pt's mental status changed shortly after standing requiring nursing assistance to help assess and return pt back to beb ?  ?  ? ?Pertinent Vitals/Pain Pain Assessment ?Pain Assessment: No/denies pain  ? ? ?Home Living   ?  ?  ?  ?  ?  ?  ?  ?  ?  ?   ?  ?Prior  Function    ?  ?  ?   ? ?PT Goals (current goals can now be found in the care plan section) Acute Rehab PT Goals ?Patient Stated Goal: Go to rehab to get stronger before coming home ? ?  ?Frequency ? ? ? Min 3X/week ? ? ? ?  ?PT Plan Current plan remains appropriate  ? ? ?Co-evaluation   ?  ?  ?  ?  ? ?  ?AM-PAC PT "6 Clicks" Mobility   ?Outcome Measure ? Help needed turning from your back to your side while in a flat bed without using bedrails?: A Little ?Help needed moving from lying on your back to sitting on the side of a flat bed without using bedrails?: A Little ?Help needed moving to and from a bed to a chair (including a wheelchair)?: A Little ?Help needed standing up from a chair using your arms (e.g., wheelchair or bedside chair)?: A Little ?Help needed to walk in hospital room?: A Little ?Help needed climbing 3-5 steps with a railing? : A Lot ?6 Click Score: 17 ? ?  ?End of Session Equipment Utilized During Treatment: Gait belt ?Activity Tolerance: Treatment limited secondary to medical complications (Comment) (? Orthostatic Hypotension) ?Patient left: in bed;with call bell/phone within reach;with bed alarm set;with nursing/sitter in room;with family/visitor present;with SCD's reapplied ?Nurse Communication: Mobility status;Other (comment) (change in mental status) ?PT Visit Diagnosis: Muscle weakness (generalized) (M62.81);Difficulty in walking, not elsewhere classified (R26.2) ?  ? ? ?Time: 1010-1100 ?PT Time Calculation (min) (ACUTE ONLY): 50 min ? ?Charges:  $Therapeutic Exercise: 8-22 mins ?$Therapeutic Activity: 23-37 mins          ?          ?Mikel Cella, PTA ? ? ? ?Josie Dixon ?02/16/2022, 1:25 PM ? ?

## 2022-02-16 NOTE — TOC Transition Note (Addendum)
Transition of Care (TOC) - CM/SW Discharge Note ? ? ?Patient Details  ?Name: Derek [name] ?MRN: [id number] ?Date of Birth: 07/06/1946 ? ?Transition of Care (TOC) CM/SW Contact:  ?Ross Ludwig, LCSW ?Phone Number: ?02/16/2022, 10:51 AM ? ? ?Clinical Narrative:    ?CSW spoke to [facility] they can accept patient today.  [facility]  Patient and family agreeable to plans will transport via ems RN to call report [phone].  Patient's wife was notified of planned discharge for today. ? ?3:00pm  Discharge cancelled CSW updated [place] [facility].  ? ?Final next level of care: [place] ?Barriers to Discharge: Barriers Resolved ? ? ?Patient Goals and CMS Choice ?Patient states their goals for this hospitalization and ongoing recovery are:: To go to SNF for rehab then return back home. ?CMS Medicare.gov Compare Post Acute Care list provided to:: Patient Represenative (must comment) ?Choice offered to / list presented to : Spouse ? ?Discharge Placement ?PASRR number recieved: 02/15/22 ?           ?Patient chooses bed at: [facility] ?Patient to be transferred to facility by: [facility] ?Name of family member notified: Wife [name] ?Patient and family notified of of transfer: 02/16/22 ? ?Discharge Plan and Services ?  ?  ?           ?  ?  ?  ?  ?  ?  ?  ?  ?  ?  ? ?Social Determinants of Health (SDOH) Interventions ?  ? ? ?Readmission Risk Interventions ?   ? View : No data to display.  ?  ?  ?  ? ? ? ? ? ?

## 2022-02-16 NOTE — Progress Notes (Signed)
? ? ? ?  Derek [name] is a 76 y.o. male  ? ?Orthopaedic diagnosis: Left femoral neck fracture ? ?Surgery: Closed reduction percutaneous pin fixation of left femoral neck fracture 02/12/2022 ? ?Subjective: ?Patient appears comfortable in bed.  He states he is not having any pain.  He has been ambulatory with physical therapy using a walker.  Eating and drinking fine.  Voiding fine.  No new concerns.  Planning to be discharged to a SNF today.  Family present in the room. ? ?Objectyive: ?Vitals:  ? 02/16/22 0245 02/16/22 0809  ?BP: (!) 124/92 126/83  ?Pulse: (!) 109 (!) 105  ?Resp: 16 18  ?Temp: 98.2 ?F (36.8 ?C) 98.2 ?F (36.8 ?C)  ?SpO2: 94% 94%  ?  ? ?Exam: ?Awake and alert ?Respirations even and unlabored ?No acute distress ? ?Examination of the left hip shows clean, dry, and intact dressings. Minimal swelling noted about the hip.  No significant tenderness. Tolerates gentle active and passive flexion and extension of the hip. Good range of motion and strength at the knee and ankle.  He can wiggle the toes without difficulty.  Calf soft nontender.  SCDs in place.  Sensation is grossly intact.  Left lower extremity is warm and well-perfused distally ? ?Assessment: ?Postop day 4 status post above, doing well ? ? ?Plan: ?Suitable for discharge from an orthopedic standpoint.  Patient is planning for SNF placement today. ?  ?-Weightbearing as tolerated with walker ?-Continue PT/OT while inpatient ?-Keep dressing clean, dry, and intact.  Reinforce or change as needed. ?-Norco 5/325 for pain at discharge, printed and signed in the patient's chart ?- '325mg'$  aspirin for DVT prophylaxis x1 month, printed and signed in the patient's chart ?-Follow-up with Dr. Lucia Gaskins at [facility] [phone]) 2 weeks from surgery for x-rays of the left hip and staple removal if appropriate. ?  ?Plan was discussed with patient and family members at bedside who are understanding and in agreement. ? ? ?Lexys Milliner J. [place], PA-C ? ? ?

## 2022-02-16 NOTE — Plan of Care (Signed)
Patient became dizzy and orthostatic after PT worked with him.  Gave IV fluid bolus, reassess the patient.  Wife and nephew at the bedside, patient is not eating too well.  Encouraged p.o. diet, continue gentle hydration.  Hold antihypertensives. ? ?Hold discharge today, reevaluate in a.m. ? ? ?Estill Cotta M.D.  ?[facility] ?02/16/2022, 3:07 PM ? ? ?  ?

## 2022-02-16 NOTE — Discharge Summary (Signed)
?Physician Discharge Summary ?  ?Patient: Derek [name] MRN: [id number] DOB: 11/20/1945  ?Admit date:     02/11/2022  ?Discharge date: 02/16/22  ?Discharge Physician: Estill Cotta, MD  ? ?PCP: Lavone Orn, MD  ? ?Recommendations at discharge:  ? ?Per orthopedics ? ?-Weightbearing as tolerated with walker ?-Continue PT/OT while inpatient ?-Keep dressing clean, dry, and intact.  Reinforce or change as needed. ?- '325mg'$  aspirin for DVT prophylaxis x1 month  ?-Follow-up with Dr. Lucia Gaskins at [facility] [phone]) 2 weeks from surgery for x-rays of the left hip and suture removal if appropriate. ? ? ? ?Discharge Diagnoses: ? ?  Closed left hip fracture, initial encounter ([place]) ?  Depression ?  History of TIA (transient ischemic attack) ?  History of coronary artery bypass graft ?  Mixed hyperlipidemia ?  Obstructive sleep apnea ?  Type 2 diabetes mellitus ([place]) ?  Gout ?  Primary open-angle glaucoma, bilateral, indeterminate stage ?  Coronary artery disease ?  Benign essential hypertension ?  Mild cognitive impairment of uncertain or unknown etiology ?  Post-traumatic stress disorder, chronic ?  Rheumatoid arthritis ([place]) ? ? ? ?Hospital Course: ? ?Patient is a 76 year old male with history of CAD status post CABG, diabetes, hypertension, PTSD/depression, OSA on CPAP, gout, glaucoma, RA, mild cognitive impairment presented after multiple falls, subsequent pain and difficulty walking. ?Patient has had ongoing falls for the past month, had pain in his left lower extremity with difficulty bearing weight and difficulty walking secondary to these falls. ?Patient had another fall on Sunday, 02/07/2022 with increased pain and difficulty walking. ?X-ray showed a subtle lucency on the left hip x-ray, recommended further imaging ?MRI of the left hip and pelvis showed nondisplaced subcapital/transcervical left femoral neck fracture. ?Orthopedics was consulted ? ?  Closed left hip fracture, mechanical fall ?-Orthopedics  was consulted,  ?-Underwent closed reduction percutaneous pin fixation of the left femoral neck fracture on 02/12/2022 ?-Continue pain control as needed, aspirin 325 mg daily for 1 month, then continue 81 mg daily due to history of coronary artery disease ?-Per orthopedics, weightbearing as tolerated to left lower extremity, follow-up in 2 weeks ?-PT recommended skilled nursing facility ?  ? ?CAD status post CABG ?-Continue home aspirin, Crestor  ?  ?  Depression, PTSD, insomnia ?-Continue home trazodone, fluoxetine ?  ?Diabetes mellitus type 2 ?---Hemoglobin A1c 5.4, well controlled ?-Patient was placed on sliding scale insulin while inpatient ?-Resume outpatient regimen at discharge ?  ?  ?OSA ?-Continue home CPAP ?  ?History of rheumatoid arthritis ?-Continue methotrexate ?  ?Glaucoma ?-Continue eyedrops ?  ?Constipation: ?-Continue Colace, MiraLAX, Dulcolax suppository as needed  ?  ? ? ?  ? ?Pain control - [employer] Controlled Substance Reporting System database was reviewed. and patient was instructed, not to drive, operate heavy machinery, perform activities at heights, swimming or participation in water activities or provide baby-sitting services while on Pain, Sleep and Anxiety Medications; until their outpatient Physician has advised to do so again. Also recommended to not to take more than prescribed Pain, Sleep and Anxiety Medications.  ?Consultants: Orthopedics ?Procedures performed: closed reduction percutaneous pin fixation of the left femoral neck fracture ?Disposition: Skilled nursing facility ?Diet recommendation:  ?Discharge Diet Orders (From admission, onward) carb modified diet  ? ? ? ?Carb modified diet ?DISCHARGE MEDICATION: ?Allergies as of 02/16/2022   ?No Known Allergies ?  ? ?  ?Medication List  ?  ? ?STOP taking these medications   ? ?aspirin EC 81 MG tablet ?Replaced by:  aspirin 325 MG tablet ?  ? ?  ? ?TAKE these medications   ? ?aspirin 325 MG tablet ?Commonly known as: Bayer  Aspirin ?Take 1 tablet (325 mg total) by mouth daily. ?Replaces: aspirin EC 81 MG tablet ?  ?docusate sodium 100 MG capsule ?Commonly known as: COLACE ?Take 1 capsule (100 mg total) by mouth 2 (two) times daily. ?  ?empagliflozin 25 MG Tabs tablet ?Commonly known as: JARDIANCE ?Take 12.5 mg by mouth every morning. ?  ?FLUoxetine 20 MG capsule ?Commonly known as: PROZAC ?Take 20 mg by mouth every morning. ?  ?furosemide 20 MG tablet ?Commonly known as: LASIX ?Take 20 mg by mouth every morning. ?  ?HYDROcodone-acetaminophen 5-325 MG tablet ?Commonly known as: NORCO/VICODIN ?Take 1 tablet by mouth every 6 (six) hours as needed for moderate pain or severe pain. ?What changed:  ?when to take this ?reasons to take this ?  ?metFORMIN 500 MG 24 hr tablet ?Commonly known as: GLUCOPHAGE-XR ?Take 1,000 mg by mouth See admin instructions. Take 2 tablets (1000 mg) by mouth every morning, take 2 tablets (1000 mg) in the evening about twice a week. ?  ?methotrexate 2.5 MG tablet ?Commonly known as: RHEUMATREX ?Take 10 mg by mouth See admin instructions. Take 4 tablets (10 mg) by mouth on Friday and Saturday mornings (twice weekly) Caution:Chemotherapy. Protect from light.; ?  ?metoprolol succinate 25 MG 24 hr tablet ?Commonly known as: TOPROL-XL ?Take 12.5 mg by mouth every morning. ?  ?multivitamin with minerals Tabs tablet ?Take 1 tablet by mouth every morning. ?  ?polyethylene glycol 17 g packet ?Commonly known as: MIRALAX / GLYCOLAX ?Take 17 g by mouth daily. ?  ?rosuvastatin 40 MG tablet ?Commonly known as: CRESTOR ?Take 20 mg by mouth every morning. ?  ? ?  ? ?  ?  ? ? ?  ?Discharge Care Instructions  ?(From admission, onward)  ?  ? ? ?  ? ?  Start     Ordered  ? 02/16/22 0000  If the dressing is still on your incision site when you go home, remove it on the third day after your surgery date. Remove dressing if it begins to fall off, or if it is dirty or damaged before the third day.       ? 02/16/22 0803  ? 02/13/22 0000   Weight bearing as tolerated       ?Comments: With walker  ?Question Answer Comment  ?Laterality left   ?Extremity Lower   ?  ? 02/13/22 1257  ? ?  ?  ? ?  ? ? Follow-up Information   ? ? Erle Crocker, MD Follow up in 2 week(s).   ?Specialty: Orthopedic Surgery ?Contact information: ?[address] ?[place] [address] ?[phone] ? ? ?  ?  ? ? Lavone Orn, MD. Schedule an appointment as soon as possible for a visit in 2 week(s).   ?Specialty: Internal Medicine ?Why: for hospital follow-up ?Contact information: ?[address], [address] ?[place] [address] ?[phone] ? ? ?  ?  ? ?  ?  ? ?  ? ?Discharge Exam: ?Filed Weights  ? 02/11/22 1204  ?Weight: 75.8 kg  ? ?S: No acute complaints, had a BM yesterday.  Wife at the bedside ?Vitals:  ? 02/15/22 2210 02/16/22 0004 02/16/22 0245 02/16/22 0809  ?BP: (!) 137/93 130/85 (!) 124/92 126/83  ?Pulse: (!) 124 (!) 116 (!) 109 (!) 105  ?Resp: '18 18 16 18  '$ ?Temp: 99.3 ?F (37.4 ?C) 98.3 ?  F (36.8 ?C) 98.2 ?F (36.8 ?C) 98.2 ?F (36.8 ?C)  ?TempSrc: Oral Oral  Oral  ?SpO2: 95% 93% 94% 94%  ?Weight:      ?Height:      ?  ?Physical Exam ?General: Alert and oriented x 3, NAD ?Cardiovascular: S1 S2 clear, RRR. No pedal edema b/l ?Respiratory: CTAB, no wheezing, rales or rhonchi ?Gastrointestinal: Soft, nontender, nondistended, NBS ?Ext: no pedal edema bilaterally ?Neuro: no new deficits ? ? ?Condition at discharge: fair ? ?The results of significant diagnostics from this hospitalization (including imaging, microbiology, ancillary and laboratory) are listed below for reference.  ? ?Imaging Studies: ?MR PELVIS WO CONTRAST ? ?Result Date: 02/11/2022 ?CLINICAL DATA:  Left hip pain EXAM: MR OF THE LEFT HIP WITHOUT CONTRAST TECHNIQUE: Multiplanar, multisequence MR imaging was performed. No intravenous contrast was administered. COMPARISON:  None Available. FINDINGS: MRI PELVIS: Bones/Joint/Cartilage Nondisplaced transverse fracture of lower sacrum at the level of S4 and  S5 (coronal T1 image 3, sagittal T2 image 33). There is a subcapital/transcervical left femoral neck fracture with intense associated marrow edema. There is degenerative disc disease at L5-S1, partially visual

## 2022-02-17 ENCOUNTER — Inpatient Hospital Stay (HOSPITAL_COMMUNITY): Payer: No Typology Code available for payment source

## 2022-02-17 DIAGNOSIS — S72402A Unspecified fracture of lower end of left femur, initial encounter for closed fracture: Secondary | ICD-10-CM | POA: Diagnosis not present

## 2022-02-17 DIAGNOSIS — Z66 Do not resuscitate: Secondary | ICD-10-CM | POA: Diagnosis present

## 2022-02-17 DIAGNOSIS — H409 Unspecified glaucoma: Secondary | ICD-10-CM | POA: Diagnosis not present

## 2022-02-17 DIAGNOSIS — R6520 Severe sepsis without septic shock: Secondary | ICD-10-CM | POA: Diagnosis present

## 2022-02-17 DIAGNOSIS — I2510 Atherosclerotic heart disease of native coronary artery without angina pectoris: Secondary | ICD-10-CM | POA: Diagnosis not present

## 2022-02-17 DIAGNOSIS — A419 Sepsis, unspecified organism: Secondary | ICD-10-CM | POA: Diagnosis present

## 2022-02-17 DIAGNOSIS — Z20822 Contact with and (suspected) exposure to covid-19: Secondary | ICD-10-CM | POA: Diagnosis present

## 2022-02-17 DIAGNOSIS — E870 Hyperosmolality and hypernatremia: Secondary | ICD-10-CM | POA: Diagnosis present

## 2022-02-17 DIAGNOSIS — J9601 Acute respiratory failure with hypoxia: Secondary | ICD-10-CM | POA: Diagnosis present

## 2022-02-17 DIAGNOSIS — R296 Repeated falls: Secondary | ICD-10-CM | POA: Diagnosis not present

## 2022-02-17 DIAGNOSIS — R748 Abnormal levels of other serum enzymes: Secondary | ICD-10-CM | POA: Diagnosis present

## 2022-02-17 DIAGNOSIS — D638 Anemia in other chronic diseases classified elsewhere: Secondary | ICD-10-CM | POA: Diagnosis present

## 2022-02-17 DIAGNOSIS — F39 Unspecified mood [affective] disorder: Secondary | ICD-10-CM | POA: Diagnosis not present

## 2022-02-17 DIAGNOSIS — Z789 Other specified health status: Secondary | ICD-10-CM | POA: Diagnosis not present

## 2022-02-17 DIAGNOSIS — R2681 Unsteadiness on feet: Secondary | ICD-10-CM | POA: Diagnosis not present

## 2022-02-17 DIAGNOSIS — W1830XD Fall on same level, unspecified, subsequent encounter: Secondary | ICD-10-CM | POA: Diagnosis not present

## 2022-02-17 DIAGNOSIS — J189 Pneumonia, unspecified organism: Secondary | ICD-10-CM | POA: Diagnosis present

## 2022-02-17 DIAGNOSIS — I10 Essential (primary) hypertension: Secondary | ICD-10-CM | POA: Diagnosis present

## 2022-02-17 DIAGNOSIS — Z8673 Personal history of transient ischemic attack (TIA), and cerebral infarction without residual deficits: Secondary | ICD-10-CM | POA: Diagnosis not present

## 2022-02-17 DIAGNOSIS — E119 Type 2 diabetes mellitus without complications: Secondary | ICD-10-CM | POA: Diagnosis present

## 2022-02-17 DIAGNOSIS — F1310 Sedative, hypnotic or anxiolytic abuse, uncomplicated: Secondary | ICD-10-CM | POA: Diagnosis not present

## 2022-02-17 DIAGNOSIS — Z9989 Dependence on other enabling machines and devices: Secondary | ICD-10-CM | POA: Diagnosis not present

## 2022-02-17 DIAGNOSIS — M069 Rheumatoid arthritis, unspecified: Secondary | ICD-10-CM | POA: Diagnosis present

## 2022-02-17 DIAGNOSIS — R262 Difficulty in walking, not elsewhere classified: Secondary | ICD-10-CM | POA: Diagnosis not present

## 2022-02-17 DIAGNOSIS — E1169 Type 2 diabetes mellitus with other specified complication: Secondary | ICD-10-CM | POA: Diagnosis not present

## 2022-02-17 DIAGNOSIS — Z951 Presence of aortocoronary bypass graft: Secondary | ICD-10-CM | POA: Diagnosis not present

## 2022-02-17 DIAGNOSIS — E876 Hypokalemia: Secondary | ICD-10-CM | POA: Diagnosis present

## 2022-02-17 DIAGNOSIS — J690 Pneumonitis due to inhalation of food and vomit: Secondary | ICD-10-CM | POA: Diagnosis present

## 2022-02-17 DIAGNOSIS — F0393 Unspecified dementia, unspecified severity, with mood disturbance: Secondary | ICD-10-CM | POA: Diagnosis present

## 2022-02-17 DIAGNOSIS — S72002A Fracture of unspecified part of neck of left femur, initial encounter for closed fracture: Secondary | ICD-10-CM | POA: Diagnosis not present

## 2022-02-17 DIAGNOSIS — Z7189 Other specified counseling: Secondary | ICD-10-CM | POA: Diagnosis not present

## 2022-02-17 DIAGNOSIS — D84821 Immunodeficiency due to drugs: Secondary | ICD-10-CM | POA: Diagnosis present

## 2022-02-17 DIAGNOSIS — F0390 Unspecified dementia without behavioral disturbance: Secondary | ICD-10-CM | POA: Diagnosis not present

## 2022-02-17 DIAGNOSIS — M6281 Muscle weakness (generalized): Secondary | ICD-10-CM | POA: Diagnosis not present

## 2022-02-17 DIAGNOSIS — B370 Candidal stomatitis: Secondary | ICD-10-CM | POA: Diagnosis present

## 2022-02-17 DIAGNOSIS — G3184 Mild cognitive impairment, so stated: Secondary | ICD-10-CM | POA: Diagnosis not present

## 2022-02-17 DIAGNOSIS — G4733 Obstructive sleep apnea (adult) (pediatric): Secondary | ICD-10-CM | POA: Diagnosis present

## 2022-02-17 DIAGNOSIS — R41841 Cognitive communication deficit: Secondary | ICD-10-CM | POA: Diagnosis not present

## 2022-02-17 DIAGNOSIS — Z515 Encounter for palliative care: Secondary | ICD-10-CM | POA: Diagnosis not present

## 2022-02-17 DIAGNOSIS — E872 Acidosis, unspecified: Secondary | ICD-10-CM | POA: Diagnosis present

## 2022-02-17 DIAGNOSIS — R638 Other symptoms and signs concerning food and fluid intake: Secondary | ICD-10-CM | POA: Diagnosis not present

## 2022-02-17 DIAGNOSIS — E440 Moderate protein-calorie malnutrition: Secondary | ICD-10-CM | POA: Diagnosis present

## 2022-02-17 DIAGNOSIS — R498 Other voice and resonance disorders: Secondary | ICD-10-CM | POA: Diagnosis not present

## 2022-02-17 DIAGNOSIS — F32A Depression, unspecified: Secondary | ICD-10-CM | POA: Diagnosis not present

## 2022-02-17 DIAGNOSIS — S72002D Fracture of unspecified part of neck of left femur, subsequent encounter for closed fracture with routine healing: Secondary | ICD-10-CM | POA: Diagnosis not present

## 2022-02-17 DIAGNOSIS — R1312 Dysphagia, oropharyngeal phase: Secondary | ICD-10-CM | POA: Diagnosis not present

## 2022-02-17 DIAGNOSIS — R1310 Dysphagia, unspecified: Secondary | ICD-10-CM | POA: Diagnosis present

## 2022-02-17 LAB — GLUCOSE, CAPILLARY
Glucose-Capillary: 115 mg/dL — ABNORMAL HIGH (ref 70–99)
Glucose-Capillary: 215 mg/dL — ABNORMAL HIGH (ref 70–99)
Glucose-Capillary: 85 mg/dL (ref 70–99)
Glucose-Capillary: 97 mg/dL (ref 70–99)

## 2022-02-17 MED ORDER — ENSURE ENLIVE PO LIQD
237.0000 mL | Freq: Two times a day (BID) | ORAL | Status: DC
  Administered 2022-02-17 (×2): 237 mL via ORAL

## 2022-02-17 MED ORDER — ACETAMINOPHEN 325 MG PO TABS
650.0000 mg | ORAL_TABLET | Freq: Once | ORAL | Status: AC
  Administered 2022-02-17: 650 mg via ORAL
  Filled 2022-02-17: qty 2

## 2022-02-17 MED ORDER — ADULT MULTIVITAMIN W/MINERALS CH
1.0000 | ORAL_TABLET | Freq: Every day | ORAL | Status: DC
  Administered 2022-02-17: 1 via ORAL
  Filled 2022-02-17: qty 1

## 2022-02-17 MED ORDER — ENSURE ENLIVE PO LIQD: 237.0000 mL | Freq: Two times a day (BID) | ORAL | 12 refills | Status: DC

## 2022-02-17 NOTE — Plan of Care (Signed)

## 2022-02-17 NOTE — Progress Notes (Signed)
Report called and given to Thomos Lemons at [facility].   ?

## 2022-02-17 NOTE — Progress Notes (Signed)
Occupational Therapy Treatment ?Patient Details ?Name: Derek [name] ?MRN: [id number] ?DOB: [birth year] ?Today's Date: 02/17/2022 ? ? ?History of present illness Patient is a 76 year old male with history of CAD status post CABG, diabetes, hypertension, PTSD/depression, OSA on CPAP, gout, glaucoma, RA, mild cognitive impairment presented after multiple falls, subsequent pain and difficulty walking. Pt had numerous falls at home with increased difficulty in getting around and they had done x-ray, but nothing showed.  After last fall they did a MRI. MRI of the left hip and pelvis showed nondisplaced subcapital/transcervical left femoral neck fracture. Pt s/p Close reduction percutaneous pin fixation of left femoral neck fracture (02/12/22) ?  ?OT comments ? Patient groggy upon arrival, not verbalizing with therapist and needing mod cues to maintain eyes open. Patient needing max A to complete bed mobility with minimal efforts/ability to mobilize legs and max A to upright trunk. In sitting patient presents with significant R lateral lean needing max multimodal cues and up to mod A to correct. With bed height elevated patient needing mod +2 to stand and max cues to correct posterior and R lateral lean. Note patient incontinent of stool needing total A for perianal care. After seated rest break patient progress to min +2 to side step from head of bed to recliner chair with nurse assist. Negative for orthostatics, denies pain and becomes a little more talkative once in chair. ?  ? ?Recommendations for follow up therapy are one component of a multi-disciplinary discharge planning process, led by the attending physician.  Recommendations may be updated based on patient status, additional functional criteria and insurance authorization. ?   ?Follow Up Recommendations ? Skilled nursing-short term rehab (<3 hours/day)  ?  ?Assistance Recommended at Discharge Frequent or constant Supervision/Assistance  ?Patient can return home  with the following ? A lot of help with walking and/or transfers;A lot of help with bathing/dressing/bathroom;Assistance with cooking/housework;Direct supervision/assist for medications management;Direct supervision/assist for financial management;Assist for transportation;Help with stairs or ramp for entrance ?  ?Equipment Recommendations ? None recommended by OT  ?  ?   ?Precautions / Restrictions Precautions ?Precautions: Fall;Other (comment) ?Precaution Comments: negative orthostatics 5/10 ?Restrictions ?Weight Bearing Restrictions: No ?LLE Weight Bearing: Weight bearing as tolerated  ? ? ?  ? ?Mobility Bed Mobility ?Overal bed mobility: Needs Assistance ?Bed Mobility: Supine to Sit ?  ?  ?Supine to sit: Max assist ?  ?  ?General bed mobility comments: Patient needing significant assist to bring legs to edge of bed and upright trunk, minimal efforts to move legs. ?  ? ? ?  ?Balance Overall balance assessment: Needs assistance ?Sitting-balance support: Feet supported, Single extremity supported, Bilateral upper extremity supported ?Sitting balance-Leahy Scale: Poor ?Sitting balance - Comments: R lateral Lean needing up to mod A to correct, multimodal cues ?Postural control: Posterior lean, Right lateral lean ?Standing balance support: Reliant on assistive device for balance, During functional activity ?Standing balance-Leahy Scale: Poor ?Standing balance comment: At times needing mod A +1-2 able to progress to min A +1-2 and use of walker ?  ?  ?  ?  ?  ?  ?  ?  ?  ?  ?  ?   ? ?ADL either performed or assessed with clinical judgement  ? ?ADL Overall ADL's : Needs assistance/impaired ?  ?  ?  ?  ?  ?  ?  ?  ?  ?  ?  ?  ?Toilet Transfer: Moderate assistance;+2 for physical assistance;+2 for safety/equipment;Cueing for safety;Cueing for sequencing;BSC/3in1;Rolling walker (  2 wheels) ?Toilet Transfer Details (indicate cue type and reason): Patient with posterior and R lateral lean needing initial mod A +2 and max cues  to correct posture to obtain static standing balance. Patient progress to min +2 to side step towards recliner. Limited eccentric control into chair ?Toileting- Clothing Manipulation and Hygiene: Total assistance;+2 for physical assistance;+2 for safety/equipment;Sit to/from stand ?Toileting - Clothing Manipulation Details (indicate cue type and reason): Upon standing note patient incontinent of bowel movement and due to poor to zero standing balance patient requiring total A for perianal care ?  ?  ?Functional mobility during ADLs: Moderate assistance;+2 for physical assistance;+2 for safety/equipment;Cueing for safety;Cueing for sequencing;Rolling walker (2 wheels) ?General ADL Comments: Patient's spouse reports R lateral lean started "A few days ago." ?  ? ? ? ?Cognition Arousal/Alertness: Lethargic ?Behavior During Therapy: Flat affect ?Overall Cognitive Status: History of cognitive impairments - at baseline ?  ?  ?  ?  ?  ?  ?  ?  ?  ?  ?  ?  ?  ?  ?  ?  ?General Comments: Patient becomes more alert with activity, minimal verbalizations but talking a little more once in recliner. Needs max multimodal cues to correct posture ?  ?  ?   ?   ?   ?   ? ? ?Pertinent Vitals/ Pain       Pain Assessment ?Pain Assessment: No/denies pain ? ?   ?   ? ?Frequency ? Min 2X/week  ? ? ? ? ?  ?Progress Toward Goals ? ?OT Goals(current goals can now be found in the care plan section) ? Progress towards OT goals: Progressing toward goals ? ?Acute Rehab OT Goals ?OT Goal Formulation: With family ?Time For Goal Achievement: 02/27/22 ?Potential to Achieve Goals: Good ?ADL Goals ?Pt Will Perform Lower Body Dressing: with supervision;sit to/from stand ?Pt Will Transfer to Toilet: with supervision;ambulating;regular height toilet;grab bars ?Pt Will Perform Toileting - Clothing Manipulation and hygiene: with supervision;sit to/from stand  ?Plan Discharge plan remains appropriate   ? ?   ?AM-PAC OT "6 Clicks" Daily Activity     ?Outcome  Measure ? ? Help from another person eating meals?: A Little ?Help from another person taking care of personal grooming?: A Little ?Help from another person toileting, which includes using toliet, bedpan, or urinal?: A Lot ?Help from another person bathing (including washing, rinsing, drying)?: A Lot ?Help from another person to put on and taking off regular upper body clothing?: A Little ?Help from another person to put on and taking off regular lower body clothing?: A Lot ?6 Click Score: 15 ? ?  ?End of Session Equipment Utilized During Treatment: Rolling walker (2 wheels) ? ?OT Visit Diagnosis: Other abnormalities of gait and mobility (R26.89) ?  ?Activity Tolerance Patient tolerated treatment well ?  ?Patient Left in chair;with call bell/phone within reach;with chair alarm set;with nursing/sitter in room;with family/visitor present ?  ?Nurse Communication  (RN present for session) ?  ? ?   ? ?Time: 1010-1033 ?OT Time Calculation (min): 23 min ? ?Charges: OT General Charges ?$OT Visit: 1 Visit ?OT Treatments ?$Self Care/Home Management : 23-37 mins ? ?Delbert Phenix OT ?OT pager: [phone] ? ? ?Rosemary Holms ?02/17/2022, 11:15 AM ?

## 2022-02-17 NOTE — Progress Notes (Signed)
Initial Nutrition Assessment ? ?DOCUMENTATION CODES:  ? ?Non-severe (moderate) malnutrition in context of social or environmental circumstances ? ?INTERVENTION:  ? ?-Ensure Plus High Protein po BID, each supplement provides 350 kcal and 20 grams of protein.  ? ?-Multivitamin with minerals daily ? ?NUTRITION DIAGNOSIS:  ? ?Moderate Malnutrition related to social / environmental circumstances (worsening depression, frequent falls) as evidenced by moderate fat depletion, moderate muscle depletion. ? ?GOAL:  ? ?Patient will meet greater than or equal to 90% of their needs ? ?MONITOR:  ? ?PO intake, Supplement acceptance, Labs, Weight trends, I & O's ? ?REASON FOR ASSESSMENT:  ? ?Consult ?Hip fracture protocol ? ?ASSESSMENT:  ? ?76 year old male with history of CAD status post CABG, diabetes, hypertension, PTSD/depression, OSA on CPAP, gout, glaucoma, RA, mild cognitive impairment presented after multiple falls, subsequent pain and difficulty walking.  Patient has had ongoing falls for the past month, had pain in his left lower extremity with difficulty bearing weight and difficulty walking secondary to these falls.  Patient had another fall on Sunday, 02/07/2022 with increased pain and difficulty walking. ? ?5/5: s/p Close reduction percutaneous pin fixation of left femoral neck fracture ?  ? ?Pt in room, wife at bedside. Pt not very interactive this morning. Kept falling asleep during visit. Per wife, pt has been sleeping a lot ever since he retired. She suspects pt has been depressed. Pt not able to do activities he once enjoyed like working in the yard since his [place] in 2019. Pt has started to not eat as well recently, has been having frequent falls.  ?Pt reports he has not been eating well this whole admission since surgery.  ?Tried Boost Plus yesterday and tolerated the first one. Wife reports pt drank a second one but pt vomited it back up last night. ?Will switch to vanilla Ensure, brought one to pt in  room. ? ?Per weight records, pt weighed 181 lbs in September 2019. Current weight: 167 lbs. ? ?Medications: Colace, Remeron, Miralax ? ?Labs reviewed: ? CBGs: 85-148 ? ?NUTRITION - FOCUSED PHYSICAL EXAM: ? ?Flowsheet Row Most Recent Value  ?Orbital Region Moderate depletion  ?Upper Arm Region Moderate depletion  ?Thoracic and Lumbar Region Moderate depletion  ?Buccal Region Mild depletion  ?Temple Region Mild depletion  ?Clavicle Bone Region Moderate depletion  ?Clavicle and Acromion Bone Region Moderate depletion  ?Scapular Bone Region Moderate depletion  ?Dorsal Hand Severe depletion  ?Patellar Region Unable to assess  ?Anterior Thigh Region Unable to assess  ?Posterior Calf Region Unable to assess  ?Edema (RD Assessment) None  ?Hair Reviewed  ?Eyes Reviewed  ?Mouth Reviewed  ?Skin Reviewed  ? ?  ? ? ?Diet Order:   ?Diet Order   ? ?       ?  Diet Carb Modified Fluid consistency: Thin; Room service appropriate? Yes  Diet effective now       ?  ?  Diet - low sodium heart healthy       ?  ? ?  ?  ? ?  ? ? ?EDUCATION NEEDS:  ? ?Education needs have been addressed ? ?Skin:  Skin Assessment: Skin Integrity Issues: ?Skin Integrity Issues:: Incisions ?Incisions: 5/5 left hip ? ?Last BM:  5/9 -type 7 ? ?Height:  ? ?Ht Readings from Last 1 Encounters:  ?02/11/22 '5\' 7"'$  (1.702 m)  ? ? ?Weight:  ? ?Wt Readings from Last 1 Encounters:  ?02/11/22 75.8 kg  ? ? ?BMI:  Body mass index is 26.16 kg/m?. ? ?Estimated Nutritional Needs:  ? ?  Kcal:  1800-2000 ? ?Protein:  85-100g ? ?Fluid:  2L/day ? ? ?Derek Bibles, MS, RD, LDN ?Inpatient Clinical Dietitian ?Contact information available via Amion ? ?

## 2022-02-17 NOTE — Discharge Summary (Signed)
?Physician Discharge Summary  ? ?Derek [name] OEU:[id number] DOB: 1946/08/26 DOA: 02/11/2022 ? ?PCP: Lavone Orn, MD ? ?Admit date: 02/11/2022 ?Discharge date:  02/17/2022 ? ?Admitted From: Home ?Disposition:  [place] ?Discharging physician: Dwyane Dee, MD ? ?Recommendations for Outpatient Follow-up:  ?Per orthopedics: ?-Weightbearing as tolerated with walker ?-Continue PT/OT while inpatient ?-Keep dressing clean, dry, and intact.  Reinforce or change as needed. ?- '325mg'$  aspirin for DVT prophylaxis x1 month  ?-Follow-up with Dr. Lucia Gaskins at [facility] [phone]) 2 weeks from surgery for x-rays of the left hip and suture removal if appropriate. ? ?Discharge Condition: stable ?CODE STATUS: Full ?Diet recommendation:  ?Diet Orders (From admission, onward)  ? ?  Start     Ordered  ? 02/17/22 0000  Diet Carb Modified       ? 02/17/22 1257  ? 02/13/22 1424  Diet Carb Modified Fluid consistency: Thin; Room service appropriate? Yes  Diet effective now       ?Question Answer Comment  ?Diet-HS Snack? Nothing   ?Calorie Level Medium 1600-2000   ?Fluid consistency: Thin   ?Room service appropriate? Yes   ?  ? 02/13/22 1423  ? 02/13/22 0000  Diet - low sodium heart healthy       ? 02/13/22 1257  ? ?  ?  ? ?  ? ? ?Hospital Course: ? ?Patient is a 76 year old male with history of CAD status post CABG, diabetes, hypertension, PTSD/depression, OSA on CPAP, gout, glaucoma, RA, mild cognitive impairment presented after multiple falls, subsequent pain and difficulty walking. ?Patient has had ongoing falls for the past month, had pain in his left lower extremity with difficulty bearing weight and difficulty walking secondary to these falls. ?Patient had another fall on Sunday, 02/07/2022 with increased pain and difficulty walking. ?X-ray showed a subtle lucency on the left hip x-ray, recommended further imaging ?MRI of the left hip and pelvis showed nondisplaced subcapital/transcervical left femoral neck  fracture. ?Orthopedics was consulted ?  ?Closed left hip fracture, mechanical fall ?-Orthopedics was consulted,  ?-Underwent closed reduction percutaneous pin fixation of the left femoral neck fracture on 02/12/2022 ?-Continue pain control as needed, aspirin 325 mg daily for 1 month, then continue 81 mg daily due to history of coronary artery disease ?-Per orthopedics, weightbearing as tolerated to left lower extremity, follow-up in 2 weeks ?-PT recommended skilled nursing facility. Accepted to [facility] ?  ?CAD status post CABG ?-Continue home aspirin, Crestor  ?  ?Depression, PTSD, insomnia ?-Continue home trazodone, fluoxetine ?  ?Diabetes mellitus type 2 ?---Hemoglobin A1c 5.4, well controlled ?-Patient was placed on sliding scale insulin while inpatient ?-Resume outpatient regimen at discharge ?  ?OSA ?-Continue home CPAP ?  ?History of rheumatoid arthritis ?-Continue methotrexate ?  ?Glaucoma ?-Continue eyedrops ?  ?Constipation: ?-Continue Colace, MiraLAX, Dulcolax suppository as needed  ? ? ? ?Principal Diagnosis: Closed left hip fracture, initial encounter ([place]) ? ?Discharge Diagnoses: ?Principal Problem: ?  Closed left hip fracture, initial encounter ([place]) ?Active Problems: ?  Depression ?  History of TIA (transient ischemic attack) ?  History of coronary artery bypass graft ?  Mixed hyperlipidemia ?  Obstructive sleep apnea ?  Type 2 diabetes mellitus ([place]) ?  Gout ?  Primary open-angle glaucoma, bilateral, indeterminate stage ?  Coronary artery disease ?  Benign essential hypertension ?  Mild cognitive impairment of uncertain or unknown etiology ?  Post-traumatic stress disorder, chronic ?  Rheumatoid arthritis ([place]) ?  Malnutrition of moderate degree ? ? ?Discharge Instructions   ? ?  Call MD / Call 911   Complete by: As directed ?  ? If you experience chest pain or shortness of breath, CALL 911 and be transported to the hospital emergency room.  If you develope a fever above 101 F, pus (white drainage)  or increased drainage or redness at the wound, or calf pain, call your surgeon's office.  ? Constipation Prevention   Complete by: As directed ?  ? Drink plenty of fluids.  Prune juice may be helpful.  You may use a stool softener, such as Colace (over the counter) 100 mg twice a day.  Use MiraLax (over the counter) for constipation as needed.  ? Diet - low sodium heart healthy   Complete by: As directed ?  ? Diet Carb Modified   Complete by: As directed ?  ? Discharge wound care:   Complete by: As directed ?  ? Continue surgical dressing in place until follow-up  ? If the dressing is still on your incision site when you go home, remove it on the third day after your surgery date. Remove dressing if it begins to fall off, or if it is dirty or damaged before the third day.   Complete by: As directed ?  ? Increase activity slowly   Complete by: As directed ?  ? Increase activity slowly   Complete by: As directed ?  ? Post-operative opioid taper instructions:   Complete by: As directed ?  ? POST-OPERATIVE OPIOID TAPER INSTRUCTIONS: ?It is important to wean off of your opioid medication as soon as possible. If you do not need pain medication after your surgery it is ok to stop day one. ?Opioids include: ?Codeine, Hydrocodone(Norco, Vicodin), Oxycodone(Percocet, oxycontin) and hydromorphone amongst others.  ?Long term and even short term use of opiods can cause: ?Increased pain response ?Dependence ?Constipation ?Depression ?Respiratory depression ?And more.  ?Withdrawal symptoms can include ?Flu like symptoms ?Nausea, vomiting ?And more ?Techniques to manage these symptoms ?Hydrate well ?Eat regular healthy meals ?Stay active ?Use relaxation techniques(deep breathing, meditating, yoga) ?Do Not substitute Alcohol to help with tapering ?If you have been on opioids for less than two weeks and do not have pain than it is ok to stop all together.  ?Plan to wean off of opioids ?This plan should start within one week post op  of your joint replacement. ?Maintain the same interval or time between taking each dose and first decrease the dose.  ?Cut the total daily intake of opioids by one tablet each day ?Next start to increase the time between doses. ?The last dose that should be eliminated is the evening dose.  ? ?  ? Weight bearing as tolerated   Complete by: As directed ?  ? With walker  ? Laterality: left  ? Extremity: Lower  ? ?  ? ?Allergies as of 02/17/2022   ?No Known Allergies ?  ? ?  ?Medication List  ?  ? ?STOP taking these medications   ? ?aspirin EC 81 MG tablet ?Replaced by: aspirin 325 MG tablet ?  ? ?  ? ?TAKE these medications   ? ?aspirin 325 MG tablet ?Commonly known as: Bayer Aspirin ?Take 1 tablet (325 mg total) by mouth daily. ?Replaces: aspirin EC 81 MG tablet ?  ?docusate sodium 100 MG capsule ?Commonly known as: COLACE ?Take 1 capsule (100 mg total) by mouth 2 (two) times daily. ?  ?empagliflozin 25 MG Tabs tablet ?Commonly known as: JARDIANCE ?Take 12.5 mg by mouth every morning. ?  ?feeding  supplement Liqd ?Take 237 mLs by mouth 2 (two) times daily between meals. ?  ?FLUoxetine 20 MG capsule ?Commonly known as: PROZAC ?Take 20 mg by mouth every morning. ?  ?furosemide 20 MG tablet ?Commonly known as: LASIX ?Take 20 mg by mouth every morning. ?  ?HYDROcodone-acetaminophen 5-325 MG tablet ?Commonly known as: NORCO/VICODIN ?Take 1 tablet by mouth every 6 (six) hours as needed for moderate pain or severe pain. ?What changed:  ?when to take this ?reasons to take this ?  ?metFORMIN 500 MG 24 hr tablet ?Commonly known as: GLUCOPHAGE-XR ?Take 1,000 mg by mouth See admin instructions. Take 2 tablets (1000 mg) by mouth every morning, take 2 tablets (1000 mg) in the evening about twice a week. ?  ?methotrexate 2.5 MG tablet ?Commonly known as: RHEUMATREX ?Take 10 mg by mouth See admin instructions. Take 4 tablets (10 mg) by mouth on Friday and Saturday mornings (twice weekly) Caution:Chemotherapy. Protect from light.; ?   ?metoprolol succinate 25 MG 24 hr tablet ?Commonly known as: TOPROL-XL ?Take 12.5 mg by mouth every morning. ?  ?multivitamin with minerals Tabs tablet ?Take 1 tablet by mouth every morning. ?  ?polyethylene glyco

## 2022-02-17 NOTE — Progress Notes (Signed)
Physical Therapy Treatment ?Patient Details ?Name: Derek [name] ?MRN: [id number] ?DOB: [birth year] ?Today's Date: 02/17/2022 ? ? ?History of Present Illness Patient is a 76 year old male with history of CAD status post CABG, diabetes, hypertension, PTSD/depression, OSA on CPAP, gout, glaucoma, RA, mild cognitive impairment presented after multiple falls, subsequent pain and difficulty walking. Pt had numerous falls at home with increased difficulty in getting around and they had done x-ray, but nothing showed.  After last fall they did a MRI. MRI of the left hip and pelvis showed nondisplaced subcapital/transcervical left femoral neck fracture. Pt s/p Close reduction percutaneous pin fixation of left femoral neck fracture (02/12/22) ? ?  ?PT Comments  ? ? Session limited to multiple transfers and transitioning in standing with RW chair<>BSC due to incontinence of stool. Pt required A of 2 for standing balance at RW and hygiene assist. Pt able to tolerate ~1 minute static standing prior to fatiguing and attempting to sit. No c/o dizziness or difficulty maintaining balance today, VSS, SpO2 93% on RA.  Pt appears functionally ready to transition to SNF for further strengthening and mobility training in order to return to prior level of function. ?   ?Recommendations for follow up therapy are one component of a multi-disciplinary discharge planning process, led by the attending physician.  Recommendations may be updated based on patient status, additional functional criteria and insurance authorization. ? ?Follow Up Recommendations ? Skilled nursing-short term rehab (<3 hours/day) ?  ?  ?Assistance Recommended at Discharge Frequent or constant Supervision/Assistance  ?Patient can return home with the following A lot of help with walking and/or transfers ?  ?Equipment Recommendations ? None recommended by PT  ?  ?Recommendations for Other Services   ? ? ?  ?Precautions / Restrictions Precautions ?Precautions: Fall;Other  (comment) ?Precaution Comments: negative orthostatics 5/10 ?Restrictions ?Weight Bearing Restrictions: No ?LLE Weight Bearing: Weight bearing as tolerated  ?  ? ?Mobility ? Bed Mobility ?  ?  ?  ?  ?  ?  ?  ?General bed mobility comments: Pt sitting up in recliner ?  ? ?Transfers ?Overall transfer level: Needs assistance ?Equipment used: Rolling walker (2 wheels) ?Transfers: Sit to/from Stand, Bed to chair/wheelchair/BSC ?Sit to Stand: Min assist, Mod assist ?Stand pivot transfers: Min assist, Mod assist ?  ?  ?  ?  ?General transfer comment: No c/o dizziness today, pt tends to try and sit without warning ?  ? ?Ambulation/Gait ?Ambulation/Gait assistance: Min assist, Mod assist ?Gait Distance (Feet): 3 Feet ?Assistive device: Rolling walker (2 wheels) ?Gait Pattern/deviations: Decreased step length - right, Decreased step length - left, Staggering left ?Gait velocity: decreased ?  ?  ?General Gait Details: Max verbal and tactile cues, very unsteady ? ? ?Stairs ?  ?  ?  ?  ?  ? ? ?Wheelchair Mobility ?  ? ?Modified Rankin (Stroke Patients Only) ?  ? ? ?  ?Balance   ?  ?  ?  ?  ?  ?  ?  ?  ?  ?  ?  ?  ?  ?  ?  ?  ?  ?  ?  ? ?  ?Cognition Arousal/Alertness: Lethargic ?Behavior During Therapy: Flat affect ?Overall Cognitive Status: History of cognitive impairments - at baseline ?  ?  ?  ?  ?  ?  ?  ?  ?  ?  ?  ?  ?  ?  ?  ?  ?General Comments: Patient becomes more alert with activity, minimal verbalizations  but talking a little more once in recliner. Needs max multimodal cues to correct posture ?  ?  ? ?  ?Exercises   ? ?  ?General Comments General comments (skin integrity, edema, etc.):  (Pt required increased time today due to several bouts of bowel incontinence after sit to stand transfers. Nursing present as well) ?  ?  ? ?Pertinent Vitals/Pain Pain Assessment ?Pain Assessment: No/denies pain  ? ? ?Home Living   ?  ?  ?  ?  ?  ?  ?  ?  ?  ?   ?  ?Prior Function    ?  ?  ?   ? ?PT Goals (current goals can now be  found in the care plan section) Acute Rehab PT Goals ?Patient Stated Goal: Go to rehab to get stronger before coming home ? ?  ?Frequency ? ? ? Min 3X/week ? ? ? ?  ?PT Plan Current plan remains appropriate  ? ? ?Co-evaluation   ?  ?  ?  ?  ? ?  ?AM-PAC PT "6 Clicks" Mobility   ?Outcome Measure ? Help needed turning from your back to your side while in a flat bed without using bedrails?: A Little ?Help needed moving from lying on your back to sitting on the side of a flat bed without using bedrails?: A Little ?Help needed moving to and from a bed to a chair (including a wheelchair)?: A Little ?Help needed standing up from a chair using your arms (e.g., wheelchair or bedside chair)?: A Little ?Help needed to walk in hospital room?: A Little ?Help needed climbing 3-5 steps with a railing? : A Lot ?6 Click Score: 17 ? ?  ?End of Session Equipment Utilized During Treatment: Gait belt ?Activity Tolerance: Other (comment) (Treatment limited due to incontinence) ?Patient left: in chair;with call bell/phone within reach;with chair alarm set;with family/visitor present;with nursing/sitter in room ?Nurse Communication: Mobility status ?PT Visit Diagnosis: Muscle weakness (generalized) (M62.81);Difficulty in walking, not elsewhere classified (R26.2) ?  ? ? ?Time: 4431-5400 ?PT Time Calculation (min) (ACUTE ONLY): 31 min ? ?Charges:  $Therapeutic Exercise: 8-22 mins ?$Therapeutic Activity: 8-22 mins          ?          ?Derek [name], PTA ? ? ?Derek [name] ?02/17/2022, 12:55 PM ? ?

## 2022-02-17 NOTE — Progress Notes (Signed)
Patient successfully weaned to RA O2 sat at 93% on room air.   ?

## 2022-02-17 NOTE — Progress Notes (Signed)
?   02/17/22 4193  ?Assess: MEWS Score  ?Temp 100.1 ?F (37.8 ?C)  ?BP 112/72  ?Pulse Rate (!) 104  ?Resp 20  ?Level of Consciousness Alert  ?SpO2 92 %  ?O2 Device Nasal Cannula  ?O2 Flow Rate (L/min) 2 L/min  ?Assess: MEWS Score  ?MEWS Temp 0  ?MEWS Systolic 0  ?MEWS Pulse 1  ?MEWS RR 0  ?MEWS LOC 0  ?MEWS Score 1  ?MEWS Score Color Green  ?Assess: if the MEWS score is Yellow or Red  ?Were vital signs taken at a resting state? Yes  ?Treat  ?Pain Scale 0-10  ?Pain Score 0  ?Assess: SIRS CRITERIA  ?SIRS Temperature  0  ?SIRS Pulse 1  ?SIRS Respirations  0  ?SIRS WBC 0  ?SIRS Score Sum  1  ? ?Patient is alert and oriented x 3. No pain complained. No symptoms of respiratory distress. Tylenol once was ordered for his temperature. spO2 = 84% on RA, place patient on 2L oxygen and spO2= 92%. Will monitor patient closely. ?

## 2022-02-17 NOTE — TOC Transition Note (Signed)
Transition of Care (TOC) - CM/SW Discharge Note ? ? ?Patient Details  ?Name: Derek [name] ?MRN: [id number] ?Date of Birth: February 04, 1946 ? ?Transition of Care (TOC) CM/SW Contact:  ?Sunny Slopes, LCSW ?Phone Number: ?02/17/2022, 1:17 PM ? ? ?Clinical Narrative:    ? ?Patient will DC to: [place] ?Anticipated DC date: 02/17/22 ?Family notified: Daughter and son in-law ?Transport by: Corey Harold ? ? ?Per MD patient ready for DC to [facility]. RN, patient, patient's family, and facility notified of DC. Discharge Summary and FL2 sent to facility. RN to call report prior to discharge [phone] room 104P). DC packet on chart. Ambulance transport requested for patient.  ? ?CSW will sign off for now as social work intervention is no longer needed. Please consult [place] again if new needs arise.  ? ?Final next level of care: [place] ?Barriers to Discharge: No Barriers Identified ? ? ?Patient Goals and CMS Choice ?Patient states their goals for this hospitalization and ongoing recovery are:: To go to SNF for rehab then return back home. ?CMS Medicare.gov Compare Post Acute Care list provided to:: Patient Represenative (must comment) ?Choice offered to / list presented to : Spouse ? ?Discharge Placement ?PASRR number recieved: 02/15/22 ?           ?Patient chooses bed at: [facility] ?Patient to be transferred to facility by: [facility] ?Name of family member notified: Daughter and son in-la1 ?Patient and family notified of of transfer: 02/17/22 ? ?Discharge Plan and Services ?  ?  ?           ?  ?  ?  ?  ?  ?  ?  ?  ?  ?  ? ?Social Determinants of Health (SDOH) Interventions ?  ? ? ?Readmission Risk Interventions ?   ? View : No data to display.  ?  ?  ?  ? ? ? ? ? ?

## 2022-02-18 DIAGNOSIS — F1310 Sedative, hypnotic or anxiolytic abuse, uncomplicated: Secondary | ICD-10-CM | POA: Diagnosis not present

## 2022-02-18 DIAGNOSIS — G4733 Obstructive sleep apnea (adult) (pediatric): Secondary | ICD-10-CM | POA: Diagnosis not present

## 2022-02-18 DIAGNOSIS — I2510 Atherosclerotic heart disease of native coronary artery without angina pectoris: Secondary | ICD-10-CM | POA: Diagnosis not present

## 2022-02-18 DIAGNOSIS — M069 Rheumatoid arthritis, unspecified: Secondary | ICD-10-CM | POA: Diagnosis not present

## 2022-02-18 DIAGNOSIS — R2681 Unsteadiness on feet: Secondary | ICD-10-CM | POA: Diagnosis not present

## 2022-02-18 DIAGNOSIS — F39 Unspecified mood [affective] disorder: Secondary | ICD-10-CM | POA: Diagnosis not present

## 2022-02-18 DIAGNOSIS — S72402A Unspecified fracture of lower end of left femur, initial encounter for closed fracture: Secondary | ICD-10-CM | POA: Diagnosis not present

## 2022-02-18 DIAGNOSIS — S72002A Fracture of unspecified part of neck of left femur, initial encounter for closed fracture: Secondary | ICD-10-CM | POA: Diagnosis not present

## 2022-02-18 DIAGNOSIS — E440 Moderate protein-calorie malnutrition: Secondary | ICD-10-CM | POA: Diagnosis not present

## 2022-02-18 DIAGNOSIS — R262 Difficulty in walking, not elsewhere classified: Secondary | ICD-10-CM | POA: Diagnosis not present

## 2022-02-18 DIAGNOSIS — I10 Essential (primary) hypertension: Secondary | ICD-10-CM | POA: Diagnosis not present

## 2022-02-18 DIAGNOSIS — H409 Unspecified glaucoma: Secondary | ICD-10-CM | POA: Diagnosis not present

## 2022-02-18 DIAGNOSIS — R296 Repeated falls: Secondary | ICD-10-CM | POA: Diagnosis not present

## 2022-02-18 DIAGNOSIS — G3184 Mild cognitive impairment, so stated: Secondary | ICD-10-CM | POA: Diagnosis not present

## 2022-02-18 DIAGNOSIS — F32A Depression, unspecified: Secondary | ICD-10-CM | POA: Diagnosis not present

## 2022-02-18 DIAGNOSIS — F0390 Unspecified dementia without behavioral disturbance: Secondary | ICD-10-CM | POA: Diagnosis not present

## 2022-02-20 ENCOUNTER — Other Ambulatory Visit: Payer: Self-pay

## 2022-02-20 ENCOUNTER — Inpatient Hospital Stay (HOSPITAL_COMMUNITY)
Admission: EM | Admit: 2022-02-20 | Discharge: 2022-03-05 | DRG: 871 | Disposition: A | Discharge status: Skilled Nursing Facility | Payer: No Typology Code available for payment source | Source: Skilled Nursing Facility | Attending: Internal Medicine | Admitting: Internal Medicine

## 2022-02-20 ENCOUNTER — Encounter (HOSPITAL_COMMUNITY): Payer: Self-pay

## 2022-02-20 ENCOUNTER — Emergency Department (HOSPITAL_COMMUNITY): Payer: No Typology Code available for payment source

## 2022-02-20 DIAGNOSIS — Z7189 Other specified counseling: Secondary | ICD-10-CM | POA: Diagnosis not present

## 2022-02-20 DIAGNOSIS — Z515 Encounter for palliative care: Secondary | ICD-10-CM

## 2022-02-20 DIAGNOSIS — Z951 Presence of aortocoronary bypass graft: Secondary | ICD-10-CM

## 2022-02-20 DIAGNOSIS — E1169 Type 2 diabetes mellitus with other specified complication: Secondary | ICD-10-CM | POA: Insufficient documentation

## 2022-02-20 DIAGNOSIS — Z79631 Long term (current) use of antimetabolite agent: Secondary | ICD-10-CM

## 2022-02-20 DIAGNOSIS — R1310 Dysphagia, unspecified: Secondary | ICD-10-CM

## 2022-02-20 DIAGNOSIS — S72002D Fracture of unspecified part of neck of left femur, subsequent encounter for closed fracture with routine healing: Secondary | ICD-10-CM | POA: Diagnosis not present

## 2022-02-20 DIAGNOSIS — E119 Type 2 diabetes mellitus without complications: Secondary | ICD-10-CM | POA: Diagnosis present

## 2022-02-20 DIAGNOSIS — A419 Sepsis, unspecified organism: Principal | ICD-10-CM | POA: Diagnosis present

## 2022-02-20 DIAGNOSIS — W1830XD Fall on same level, unspecified, subsequent encounter: Secondary | ICD-10-CM

## 2022-02-20 DIAGNOSIS — I10 Essential (primary) hypertension: Secondary | ICD-10-CM | POA: Diagnosis present

## 2022-02-20 DIAGNOSIS — E870 Hyperosmolality and hypernatremia: Secondary | ICD-10-CM | POA: Diagnosis present

## 2022-02-20 DIAGNOSIS — R1312 Dysphagia, oropharyngeal phase: Secondary | ICD-10-CM | POA: Diagnosis not present

## 2022-02-20 DIAGNOSIS — E872 Acidosis, unspecified: Secondary | ICD-10-CM | POA: Diagnosis present

## 2022-02-20 DIAGNOSIS — R6520 Severe sepsis without septic shock: Secondary | ICD-10-CM

## 2022-02-20 DIAGNOSIS — M069 Rheumatoid arthritis, unspecified: Secondary | ICD-10-CM | POA: Diagnosis present

## 2022-02-20 DIAGNOSIS — D638 Anemia in other chronic diseases classified elsewhere: Secondary | ICD-10-CM | POA: Diagnosis present

## 2022-02-20 DIAGNOSIS — D84821 Immunodeficiency due to drugs: Secondary | ICD-10-CM | POA: Diagnosis present

## 2022-02-20 DIAGNOSIS — Z9181 History of falling: Secondary | ICD-10-CM | POA: Diagnosis not present

## 2022-02-20 DIAGNOSIS — Z8673 Personal history of transient ischemic attack (TIA), and cerebral infarction without residual deficits: Secondary | ICD-10-CM | POA: Diagnosis not present

## 2022-02-20 DIAGNOSIS — I2510 Atherosclerotic heart disease of native coronary artery without angina pectoris: Secondary | ICD-10-CM | POA: Diagnosis present

## 2022-02-20 DIAGNOSIS — G4733 Obstructive sleep apnea (adult) (pediatric): Secondary | ICD-10-CM | POA: Diagnosis present

## 2022-02-20 DIAGNOSIS — Z789 Other specified health status: Secondary | ICD-10-CM | POA: Diagnosis not present

## 2022-02-20 DIAGNOSIS — J189 Pneumonia, unspecified organism: Secondary | ICD-10-CM | POA: Diagnosis present

## 2022-02-20 DIAGNOSIS — B370 Candidal stomatitis: Secondary | ICD-10-CM | POA: Diagnosis present

## 2022-02-20 DIAGNOSIS — R262 Difficulty in walking, not elsewhere classified: Secondary | ICD-10-CM | POA: Diagnosis not present

## 2022-02-20 DIAGNOSIS — F0393 Unspecified dementia, unspecified severity, with mood disturbance: Secondary | ICD-10-CM | POA: Diagnosis present

## 2022-02-20 DIAGNOSIS — Z7984 Long term (current) use of oral hypoglycemic drugs: Secondary | ICD-10-CM

## 2022-02-20 DIAGNOSIS — E440 Moderate protein-calorie malnutrition: Secondary | ICD-10-CM | POA: Diagnosis present

## 2022-02-20 DIAGNOSIS — J690 Pneumonitis due to inhalation of food and vomit: Secondary | ICD-10-CM | POA: Diagnosis present

## 2022-02-20 DIAGNOSIS — E782 Mixed hyperlipidemia: Secondary | ICD-10-CM | POA: Diagnosis present

## 2022-02-20 DIAGNOSIS — R638 Other symptoms and signs concerning food and fluid intake: Secondary | ICD-10-CM

## 2022-02-20 DIAGNOSIS — J9601 Acute respiratory failure with hypoxia: Secondary | ICD-10-CM

## 2022-02-20 DIAGNOSIS — F0390 Unspecified dementia without behavioral disturbance: Secondary | ICD-10-CM | POA: Diagnosis present

## 2022-02-20 DIAGNOSIS — E876 Hypokalemia: Secondary | ICD-10-CM | POA: Diagnosis present

## 2022-02-20 DIAGNOSIS — R748 Abnormal levels of other serum enzymes: Secondary | ICD-10-CM | POA: Diagnosis present

## 2022-02-20 DIAGNOSIS — E878 Other disorders of electrolyte and fluid balance, not elsewhere classified: Secondary | ICD-10-CM | POA: Insufficient documentation

## 2022-02-20 DIAGNOSIS — Z20822 Contact with and (suspected) exposure to covid-19: Secondary | ICD-10-CM | POA: Diagnosis present

## 2022-02-20 DIAGNOSIS — Z96641 Presence of right artificial hip joint: Secondary | ICD-10-CM | POA: Diagnosis present

## 2022-02-20 DIAGNOSIS — S72002A Fracture of unspecified part of neck of left femur, initial encounter for closed fracture: Secondary | ICD-10-CM

## 2022-02-20 DIAGNOSIS — Z6823 Body mass index (BMI) 23.0-23.9, adult: Secondary | ICD-10-CM

## 2022-02-20 DIAGNOSIS — Z7982 Long term (current) use of aspirin: Secondary | ICD-10-CM

## 2022-02-20 DIAGNOSIS — R41841 Cognitive communication deficit: Secondary | ICD-10-CM | POA: Diagnosis not present

## 2022-02-20 DIAGNOSIS — Z8249 Family history of ischemic heart disease and other diseases of the circulatory system: Secondary | ICD-10-CM

## 2022-02-20 DIAGNOSIS — M6281 Muscle weakness (generalized): Secondary | ICD-10-CM | POA: Diagnosis not present

## 2022-02-20 DIAGNOSIS — Z9989 Dependence on other enabling machines and devices: Secondary | ICD-10-CM | POA: Diagnosis not present

## 2022-02-20 DIAGNOSIS — G3184 Mild cognitive impairment, so stated: Secondary | ICD-10-CM | POA: Diagnosis present

## 2022-02-20 DIAGNOSIS — Z79899 Other long term (current) drug therapy: Secondary | ICD-10-CM

## 2022-02-20 DIAGNOSIS — Z66 Do not resuscitate: Secondary | ICD-10-CM | POA: Diagnosis present

## 2022-02-20 DIAGNOSIS — R498 Other voice and resonance disorders: Secondary | ICD-10-CM | POA: Diagnosis not present

## 2022-02-20 LAB — LACTIC ACID, PLASMA
Lactic Acid, Venous: 2.2 mmol/L (ref 0.5–1.9)
Lactic Acid, Venous: 1.6 mmol/L (ref 0.5–1.9)

## 2022-02-20 LAB — COMPREHENSIVE METABOLIC PANEL
ALT: 31 U/L (ref 0–44)
AST: 52 U/L — ABNORMAL HIGH (ref 15–41)
Albumin: 2.8 g/dL — ABNORMAL LOW (ref 3.5–5.0)
Alkaline Phosphatase: 102 U/L (ref 38–126)
Anion gap: 14 (ref 5–15)
BUN: 22 mg/dL (ref 8–23)
CO2: 18 mmol/L — ABNORMAL LOW (ref 22–32)
Calcium: 9.4 mg/dL (ref 8.9–10.3)
Chloride: 110 mmol/L (ref 98–111)
Creatinine, Ser: 1.04 mg/dL (ref 0.61–1.24)
GFR, Estimated: >60 mL/min (ref >60)
Glucose, Bld: 102 mg/dL — ABNORMAL HIGH (ref 70–99)
Potassium: 3.2 mmol/L — ABNORMAL LOW (ref 3.5–5.1)
Sodium: 142 mmol/L (ref 135–145)
Total Bilirubin: 1.5 mg/dL — ABNORMAL HIGH (ref 0.3–1.2)
Total Protein: 7.2 g/dL (ref 6.5–8.1)

## 2022-02-20 LAB — URINALYSIS, ROUTINE W REFLEX MICROSCOPIC
Bilirubin Urine: NEGATIVE
Glucose, UA: >=500 mg/dL — AB
Ketones, ur: 20 mg/dL — AB
Leukocytes,Ua: NEGATIVE
Nitrite: NEGATIVE
Protein, ur: 100 mg/dL — AB
Specific Gravity, Urine: 1.027 (ref 1.005–1.030)
pH: 5 (ref 5.0–8.0)

## 2022-02-20 LAB — CBC WITH DIFFERENTIAL/PLATELET
Abs Immature Granulocytes: 0.11 10*3/uL — ABNORMAL HIGH (ref 0.00–0.07)
Basophils Absolute: 0.1 10*3/uL (ref 0.0–0.1)
Basophils Relative: 1 %
Eosinophils Absolute: 0 10*3/uL (ref 0.0–0.5)
Eosinophils Relative: 0 %
HCT: 40.9 % (ref 39.0–52.0)
Hemoglobin: 14.4 g/dL (ref 13.0–17.0)
Immature Granulocytes: 1 %
Lymphocytes Relative: 2 %
Lymphs Abs: 0.3 10*3/uL — ABNORMAL LOW (ref 0.7–4.0)
MCH: 34.9 pg — ABNORMAL HIGH (ref 26.0–34.0)
MCHC: 35.2 g/dL (ref 30.0–36.0)
MCV: 99 fL (ref 80.0–100.0)
Monocytes Absolute: 0.8 10*3/uL (ref 0.1–1.0)
Monocytes Relative: 6 %
Neutro Abs: 11.9 10*3/uL — ABNORMAL HIGH (ref 1.7–7.7)
Neutrophils Relative %: 90 %
Platelets: 338 10*3/uL (ref 150–400)
RBC: 4.13 MIL/uL — ABNORMAL LOW (ref 4.22–5.81)
RDW: 13.5 % (ref 11.5–15.5)
WBC: 13.3 10*3/uL — ABNORMAL HIGH (ref 4.0–10.5)
nRBC: 0 % (ref 0.0–0.2)

## 2022-02-20 LAB — RESP PANEL BY RT-PCR (FLU A&B, COVID) ARPGX2
Influenza A by PCR: NEGATIVE
Influenza B by PCR: NEGATIVE
SARS Coronavirus 2 by RT PCR: NEGATIVE

## 2022-02-20 LAB — PROTIME-INR
INR: 1.4 — ABNORMAL HIGH (ref 0.8–1.2)
Prothrombin Time: 17.3 seconds — ABNORMAL HIGH (ref 11.4–15.2)

## 2022-02-20 LAB — BRAIN NATRIURETIC PEPTIDE: B Natriuretic Peptide: 210.3 pg/mL — ABNORMAL HIGH (ref 0.0–100.0)

## 2022-02-20 LAB — BILIRUBIN, FRACTIONATED(TOT/DIR/INDIR)
Bilirubin, Direct: 0.4 mg/dL — ABNORMAL HIGH (ref 0.0–0.2)
Indirect Bilirubin: 1.1 mg/dL — ABNORMAL HIGH (ref 0.3–0.9)
Total Bilirubin: 1.5 mg/dL — ABNORMAL HIGH (ref 0.3–1.2)

## 2022-02-20 LAB — MAGNESIUM: Magnesium: 2.3 mg/dL (ref 1.7–2.4)

## 2022-02-20 LAB — MRSA NEXT GEN BY PCR, NASAL: MRSA by PCR Next Gen: NOT DETECTED

## 2022-02-20 LAB — APTT: aPTT: 34 seconds (ref 24–36)

## 2022-02-20 LAB — GLUCOSE, CAPILLARY
Glucose-Capillary: 106 mg/dL — ABNORMAL HIGH (ref 70–99)
Glucose-Capillary: 193 mg/dL — ABNORMAL HIGH (ref 70–99)

## 2022-02-20 LAB — STREP PNEUMONIAE URINARY ANTIGEN: Strep Pneumo Urinary Antigen: NEGATIVE

## 2022-02-20 MED ORDER — GUAIFENESIN ER 600 MG PO TB12
600.0000 mg | ORAL_TABLET | Freq: Two times a day (BID) | ORAL | Status: DC
  Administered 2022-02-20 – 2022-03-02 (×8): 600 mg via ORAL
  Filled 2022-02-20 (×12): qty 1

## 2022-02-20 MED ORDER — ONDANSETRON HCL 4 MG/2ML IJ SOLN
4.0000 mg | Freq: Four times a day (QID) | INTRAMUSCULAR | Status: DC | PRN
  Administered 2022-02-21: 4 mg via INTRAVENOUS
  Filled 2022-02-20: qty 2

## 2022-02-20 MED ORDER — SODIUM CHLORIDE 0.9 % IV SOLN
2.0000 g | Freq: Two times a day (BID) | INTRAVENOUS | Status: DC
  Administered 2022-02-20 – 2022-02-21 (×2): 2 g via INTRAVENOUS
  Filled 2022-02-20 (×2): qty 12.5

## 2022-02-20 MED ORDER — SODIUM CHLORIDE 0.9 % IV SOLN
2.0000 g | INTRAVENOUS | Status: DC
  Administered 2022-02-20: 2 g via INTRAVENOUS
  Filled 2022-02-20: qty 20

## 2022-02-20 MED ORDER — LACTATED RINGERS IV SOLN: INTRAVENOUS | Status: AC

## 2022-02-20 MED ORDER — FLUOXETINE HCL 20 MG PO CAPS
20.0000 mg | ORAL_CAPSULE | Freq: Every morning | ORAL | Status: DC
Start: 2022-02-21 — End: 2022-02-26
  Administered 2022-02-22 – 2022-02-25 (×4): 20 mg via ORAL
  Filled 2022-02-20 (×4): qty 1

## 2022-02-20 MED ORDER — ACETAMINOPHEN 325 MG PO TABS
650.0000 mg | ORAL_TABLET | Freq: Four times a day (QID) | ORAL | Status: DC | PRN
  Administered 2022-02-20: 650 mg via ORAL
  Filled 2022-02-20: qty 2

## 2022-02-20 MED ORDER — ALBUTEROL SULFATE (2.5 MG/3ML) 0.083% IN NEBU
5.0000 mg | INHALATION_SOLUTION | Freq: Once | RESPIRATORY_TRACT | Status: AC
  Administered 2022-02-20: 5 mg via RESPIRATORY_TRACT
  Filled 2022-02-20: qty 6

## 2022-02-20 MED ORDER — SODIUM CHLORIDE 0.9 % IV SOLN
500.0000 mg | INTRAVENOUS | Status: DC
  Administered 2022-02-20: 500 mg via INTRAVENOUS
  Filled 2022-02-20: qty 5

## 2022-02-20 MED ORDER — ONDANSETRON HCL 4 MG PO TABS: 4.0000 mg | ORAL_TABLET | Freq: Four times a day (QID) | ORAL | Status: DC | PRN

## 2022-02-20 MED ORDER — POTASSIUM CHLORIDE 20 MEQ PO PACK
40.0000 meq | PACK | Freq: Two times a day (BID) | ORAL | Status: AC
  Administered 2022-02-20: 40 meq via ORAL
  Filled 2022-02-20: qty 2

## 2022-02-20 MED ORDER — FA-PYRIDOXINE-CYANOCOBALAMIN 2.5-25-2 MG PO TABS
1.0000 | ORAL_TABLET | Freq: Every day | ORAL | Status: DC
  Administered 2022-02-20 – 2022-03-05 (×12): 1 via ORAL
  Filled 2022-02-20 (×14): qty 1

## 2022-02-20 MED ORDER — ASPIRIN 325 MG PO TABS
325.0000 mg | ORAL_TABLET | Freq: Every day | ORAL | Status: DC
  Administered 2022-02-20: 325 mg via ORAL
  Filled 2022-02-20: qty 1

## 2022-02-20 MED ORDER — IPRATROPIUM BROMIDE 0.02 % IN SOLN
0.5000 mg | Freq: Once | RESPIRATORY_TRACT | Status: AC
  Administered 2022-02-20: 0.5 mg via RESPIRATORY_TRACT
  Filled 2022-02-20: qty 2.5

## 2022-02-20 MED ORDER — POLYETHYLENE GLYCOL 3350 17 G PO PACK
17.0000 g | PACK | Freq: Every day | ORAL | Status: DC
  Administered 2022-02-24 – 2022-03-05 (×8): 17 g via ORAL
  Filled 2022-02-20 (×9): qty 1

## 2022-02-20 MED ORDER — VANCOMYCIN HCL 1500 MG/300ML IV SOLN
1500.0000 mg | INTRAVENOUS | Status: AC
  Administered 2022-02-20: 1500 mg via INTRAVENOUS
  Filled 2022-02-20: qty 300

## 2022-02-20 MED ORDER — HYDROCODONE-ACETAMINOPHEN 5-325 MG PO TABS: 1.0000 | ORAL_TABLET | ORAL | Status: DC | PRN

## 2022-02-20 MED ORDER — DOCUSATE SODIUM 100 MG PO CAPS
100.0000 mg | ORAL_CAPSULE | Freq: Two times a day (BID) | ORAL | Status: DC
  Administered 2022-02-24 – 2022-03-01 (×7): 100 mg via ORAL
  Filled 2022-02-20 (×14): qty 1

## 2022-02-20 MED ORDER — POTASSIUM CHLORIDE CRYS ER 20 MEQ PO TBCR
40.0000 meq | EXTENDED_RELEASE_TABLET | Freq: Once | ORAL | Status: DC
  Filled 2022-02-20: qty 2

## 2022-02-20 MED ORDER — LEVALBUTEROL HCL 0.63 MG/3ML IN NEBU
0.6300 mg | INHALATION_SOLUTION | Freq: Four times a day (QID) | RESPIRATORY_TRACT | Status: AC
  Administered 2022-02-20 – 2022-02-21 (×4): 0.63 mg via RESPIRATORY_TRACT
  Filled 2022-02-20 (×4): qty 3

## 2022-02-20 MED ORDER — ROSUVASTATIN CALCIUM 20 MG PO TABS
20.0000 mg | ORAL_TABLET | Freq: Every day | ORAL | Status: DC
Start: 2022-02-20 — End: 2022-02-22
  Administered 2022-02-20 – 2022-02-21 (×2): 20 mg via ORAL
  Filled 2022-02-20 (×2): qty 1

## 2022-02-20 MED ORDER — INSULIN ASPART 100 UNIT/ML IJ SOLN
0.0000 [IU] | Freq: Three times a day (TID) | INTRAMUSCULAR | Status: DC
  Administered 2022-02-22 – 2022-02-24 (×5): 1 [IU] via SUBCUTANEOUS
  Administered 2022-02-24: 2 [IU] via SUBCUTANEOUS
  Administered 2022-02-25: 1 [IU] via SUBCUTANEOUS
  Administered 2022-03-03: 2 [IU] via SUBCUTANEOUS
  Administered 2022-03-04 – 2022-03-05 (×2): 1 [IU] via SUBCUTANEOUS

## 2022-02-20 MED ORDER — ENSURE ENLIVE PO LIQD
237.0000 mL | Freq: Two times a day (BID) | ORAL | Status: DC
  Administered 2022-02-22: 237 mL via ORAL

## 2022-02-20 MED ORDER — SODIUM CHLORIDE 0.9 % IV SOLN: 2.0000 g | Freq: Two times a day (BID) | INTRAVENOUS | Status: DC

## 2022-02-20 MED ORDER — ACETAMINOPHEN 650 MG RE SUPP
650.0000 mg | Freq: Four times a day (QID) | RECTAL | Status: DC | PRN
  Filled 2022-02-20: qty 1

## 2022-02-20 MED ORDER — ACETAMINOPHEN 500 MG PO TABS
1000.0000 mg | ORAL_TABLET | Freq: Once | ORAL | Status: AC
Start: 2022-02-20 — End: 2022-02-20
  Administered 2022-02-20: 1000 mg via ORAL
  Filled 2022-02-20: qty 2

## 2022-02-20 NOTE — Progress Notes (Signed)
Pharmacy Antibiotic Note ? ?Derek [name] is a 76 y.o. male admitted on 02/20/2022 with pneumonia.  Pharmacy consulted for Vancomycin dosing. ? ?Plan: ?Vancomycin '1500mg'$  IV x 1 loading dose given. MRSA PCR returned and is negative. Per discussion with Dr. Marylyn Ishihara, d/c Vancomycin.  ? ?Height: '5\' 7"'$  (170.2 cm) ?Weight: 69.2 kg (152 lb 8 oz) ?IBW/kg (Calculated) : 66.1 ? ?Temp (24hrs), Avg:99.8 ?F (37.7 ?C), Min:97.9 ?F (36.6 ?C), Max:101.8 ?F (38.8 ?C) ? ?Recent Labs  ?Lab 02/14/22 ?1537 02/15/22 ?9432 02/20/22 ?1017 02/20/22 ?1310  ?WBC 11.1* 9.5 13.3*  --   ?CREATININE 0.94  --  1.04  --   ?LATICACIDVEN  --   --  2.2* 1.6  ?  ?Estimated Creatinine Clearance: 59.1 mL/min (by C-G formula based on SCr of 1.04 mg/dL).   ? ?No Known Allergies ? ? ?Thank you for allowing pharmacy to be a part of this patient?s care. ? ?Luiz Ochoa ?02/20/2022 5:43 PM ? ?

## 2022-02-20 NOTE — H&P (Signed)
?History and Physical  ? ? ?Patient: Derek [name]:[id number] DOB: 15-Aug-1946 ?DOA: 02/20/2022 ?DOS: the patient was seen and examined on 02/20/2022 ?PCP: Lavone Orn, MD  ?Patient coming from: SNF ? ?Chief Complaint:  ?Chief Complaint  ?Patient presents with  ? Fever  ? ?HPI: Derek [name] is a 76 y.o. male with medical history significant of CAD s/p CABG, DM2, HTN, OSA on CPAP, mild cognitive impairment. Presenting with dyspnea and fever. Recently admitted for hip fracture. Was sent to SNF for rehab on 5/10. While there he apparently became dyspneic and febrile. He had become increasingly lethargic. When he was examined this morning, he was noted to be hypoxic into the 80s. The SNF staff became concerned and called for EMS.  ?  ?Review of Systems: Unable to review all systems due to lack of cooperation from patient as he is somewhat confused and drowsy. ?Past Medical History:  ?Diagnosis Date  ? Arthritis   ? Coronary artery disease   ? Diabetes mellitus   ? type 2  ? DJD (degenerative joint disease)   ? R hip DJD  ? H/O: upper GI bleed 01/2001   ? related to nonsteroidal use, CLO test +(Med Tx- Prilosec, Amoxicillin, & Biaxin)  ? Hypertension   ? Insomnia   ? Mental disorder   ? PTSD  ? Peptic ulcer disease   ? history of gastric ulcer  ? PTSD (post-traumatic stress disorder)   ? [facility]  ? Sarcoidosis   ? late 1970s  ? Scleritis   ? Sleep apnea   ? cpap with O2 4L for 4 yrs  ? Tubular adenoma 2004  ? ?Past Surgical History:  ?Procedure Laterality Date  ? CARDIAC CATHETERIZATION    ? CORONARY ARTERY BYPASS GRAFT  12/02/2017  ? at [facility] Dr Lunette Stands Surgeon  ? EYE SURGERY  05  ? growth lft  ? HIP PINNING,CANNULATED Left 02/12/2022  ? Procedure: CANNULATED HIP PINNING;  Surgeon: Erle Crocker, MD;  Location: [facility];  Service: Orthopedics;  Laterality: Left;  ? TOTAL HIP ARTHROPLASTY  06/13/2012  ? TOTAL HIP ARTHROPLASTY  06/13/2012  ? Procedure: TOTAL HIP ARTHROPLASTY ANTERIOR APPROACH;  Surgeon: Hessie Dibble, MD;  Location: [place];  Service: Orthopedics;  Laterality: Right;  right anterior approach total hip arthroplasty  ? ?Social History:  reports that he has never smoked. He has never used smokeless tobacco. He reports current alcohol use. He reports that he does not use drugs. ? ?No Known Allergies ? ?Family History  ?Problem Relation Age of Onset  ? Stroke Mother   ? Hypertension Mother   ? Hypertension Father   ? Hypertension Brother   ? Gout Brother   ? Deep vein thrombosis Brother   ? ? ?Prior to Admission medications   ?Medication Sig Start Date End Date Taking? Authorizing Provider  ?acetaminophen (TYLENOL) 650 MG CR tablet Take 650 mg by mouth 2 (two) times daily.   Yes [provider]  ?aspirin (BAYER ASPIRIN) 325 MG tablet Take 1 tablet (325 mg total) by mouth daily. 02/13/22 03/15/22 Yes Martinique, Jesse J, PA-C  ?docusate sodium (COLACE) 100 MG capsule Take 1 capsule (100 mg total) by mouth 2 (two) times daily. 02/16/22  Yes Rai, Ripudeep K, MD  ?empagliflozin (JARDIANCE) 25 MG TABS tablet Take 12.5 mg by mouth every morning.   Yes [provider]  ?feeding supplement (ENSURE ENLIVE / ENSURE PLUS) LIQD Take 237 mLs by mouth 2 (two) times daily between meals. 02/17/22  Yes Dwyane Dee, MD  ?FLUoxetine (PROZAC) 20 MG capsule Take 20 mg by mouth every morning.   Yes [provider]  ?Folic Acid-Vit D1-SHF W26 (FOLBEE) 2.5-25-1 MG TABS tablet Take 1 tablet by mouth daily.   Yes [provider]  ?furosemide (LASIX) 20 MG tablet Take 10 mg by mouth every morning.   Yes [provider]  ?HYDROcodone-acetaminophen (NORCO/VICODIN) 5-325 MG tablet Take 1 tablet by mouth every 6 (six) hours as needed for moderate pain or severe pain. 02/13/22 02/13/23 Yes [place], Jesse J, PA-C  ?metFORMIN (GLUCOPHAGE-XR) 500 MG 24 hr tablet Take 1,000 mg by mouth daily with breakfast. 08/03/21  Yes [provider]  ?methotrexate (RHEUMATREX) 2.5 MG tablet Take 10 mg by mouth 2 (two)  times a week. Friday & Saturday   Yes [provider]  ?metoprolol succinate (TOPROL-XL) 25 MG 24 hr tablet Take 12.5 mg by mouth every morning. 02/08/20  Yes [provider]  ?polyethylene glycol (MIRALAX / GLYCOLAX) 17 g packet Take 17 g by mouth daily. 02/16/22  Yes Rai, Ripudeep K, MD  ?rosuvastatin (CRESTOR) 20 MG tablet Take 20 mg by mouth at bedtime. 12/23/21  Yes [provider]  ? ? ?Physical Exam: ?Vitals:  ? 02/20/22 1025 02/20/22 1130 02/20/22 1230  ?BP: 134/71 110/80 110/75  ?Pulse: (!) 123 (!) 109 (!) 110  ?Resp: (!) 35 (!) 27 (!) 27  ?Temp: (!) 101.8 ?F (38.8 ?C)    ?TempSrc: Rectal    ?SpO2: (!) 87% 99% 95%  ? ?General: 76 y.o. male resting in bed in NAD ?Eyes: PERRL, normal sclera ?ENMT: Nares patent w/o discharge, orophaynx clear, dentition normal, ears w/o discharge/lesions/ulcers ?Neck: Supple, trachea midline ?Cardiovascular: RRR, +S1, S2, no m/g/r, equal pulses throughout ?Respiratory: scattered rhonchi, tachypneic, slightly increased WOB in NRB ?GI: BS+, NDNT, no masses noted, no organomegaly noted ?MSK: No e/c/c ?Skin: No rashes, bruises, ulcerations noted ?Neuro: A&O x 2 (name/place), no focal deficits ?Psyc: Drowsy, calm/cooperative ? ?Data Reviewed: ? ?K+  3.2 ?CO2  18 ?Albumin  2.8 ?T. Bili  1.5 ?BNP  210 ?Lactic acid 2.2 ?WBC  13.3 ? ?CXR: New airspace disease in the medial right base compatible with ?atelectasis or pneumonia. ? ?Assessment and Plan: ?RLL PNA ?Sepsis secondary to above ?Acute respiratory failure w/ hypoxia ?    - admit to inpt, progressive ?    - HCAP coverage, check MRSA swab ?    - nebs, guaifenesin ?    - urine legionella/strep ?    - COVID/flu negative ?    - wean O2 as able ? ?Hypokalemia ?    - replace K+, check Mg2+ ? ?Moderate protein-calorie malnutrition ?    - continue supplement ? ?Elevated T bili ?    - check fractionated bili ? ?Mild cognitive impairment ?    - resume home regimen ?    - delirium precautions ? ?DM2 ?    - SSI, glucose  checks, DM diet ? ?Rheumatoid arthritis ?    - continue home regimen ? ?CAD s/p CABG ?HLD ?    - continue home regimen except beta blocker as his BP is somewhat soft ? ?HTN ?    - hold home regimen for right now ? ?OSA ?    - CPAP at night ? ?Advance Care Planning:   Code Status: FULL ? ?Consults: None ? ?Family Communication: w/ wife at bedside ? ?Severity of Illness: ?The appropriate patient status for this patient is INPATIENT. Inpatient status is  judged to be reasonable and necessary in order to provide the required intensity of service to ensure the patient's safety. The patient's presenting symptoms, physical exam findings, and initial radiographic and laboratory data in the context of their chronic comorbidities is felt to place them at high risk for further clinical deterioration. Furthermore, it is not anticipated that the patient will be medically stable for discharge from the hospital within 2 midnights of admission.  ? ?* I certify that at the point of admission it is my clinical judgment that the patient will require inpatient hospital care spanning beyond 2 midnights from the point of admission due to high intensity of service, high risk for further deterioration and high frequency of surveillance required.* ? ?Author: ?Jonnie Finner, DO ?02/20/2022 1:10 PM ? ?For on call review [email or web address].  ?

## 2022-02-20 NOTE — Progress Notes (Addendum)
Received patient from ED, VS obtained, telemetry monitor applied, wife at bedside, oriented to unit and call light placed in reach  ?Patient is yellow MEWS however VS are ordered Q2hrs at this time ?

## 2022-02-20 NOTE — ED Triage Notes (Signed)
[facility] reports pt coming from [facility]. Pt new resident so nurse did not have much of report to give. Upon staff assessment pt hot to the touch, tachy and pulse ox85-87% on his normal 2.5L [place]. Pt lethargic per staff. ?

## 2022-02-20 NOTE — ED Provider Notes (Addendum)
?[facility] ?Provider Note ? ? ?CSN: [id number] ?Arrival date & time: 02/20/22  1001 ? ?  ? ?History ? ?Chief Complaint  ?Patient presents with  ? Fever  ? ? ?Derek [name] is a 76 y.o. male. ? ?Patient with history of CAD status post CABG, diabetes, hypertension, PTSD/depression, OSA on CPAP, gout, glaucoma, RA on methotrexate, mild cognitive impairment, ED evaluation on 02/11/2022 for hip pain after falling and diagnosed with left-sided hip fracture, surgery on 02/15/2022 and discharged from the hospital to [facility] on 02/17/2022.  He presents from facility today due to concern for sepsis and lethargy.  Patient had reported fever and increased respiratory rate and breathing difficulty.  Patient was transported by EMS.  No family currently at bedside.  Patient was found to be hypoxic into the 80s on 2 L, increased to 3 by EMS with improvement to the 90s. ? ? ?  ? ?Home Medications ?Prior to Admission medications   ?Medication Sig Start Date End Date Taking? Authorizing Provider  ?aspirin (BAYER ASPIRIN) 325 MG tablet Take 1 tablet (325 mg total) by mouth daily. 02/13/22 03/15/22  Martinique, Jesse J, PA-C  ?docusate sodium (COLACE) 100 MG capsule Take 1 capsule (100 mg total) by mouth 2 (two) times daily. 02/16/22   Rai, Vernelle Emerald, MD  ?empagliflozin (JARDIANCE) 25 MG TABS tablet Take 12.5 mg by mouth every morning.    [provider]  ?feeding supplement (ENSURE ENLIVE / ENSURE PLUS) LIQD Take 237 mLs by mouth 2 (two) times daily between meals. 02/17/22   Dwyane Dee, MD  ?FLUoxetine (PROZAC) 20 MG capsule Take 20 mg by mouth every morning.    [provider]  ?furosemide (LASIX) 20 MG tablet Take 20 mg by mouth every morning.    [provider]  ?HYDROcodone-acetaminophen (NORCO/VICODIN) 5-325 MG tablet Take 1 tablet by mouth every 6 (six) hours as needed for moderate pain or severe pain. 02/13/22 02/13/23  Martinique, Jesse J, PA-C  ?metFORMIN (GLUCOPHAGE-XR)  500 MG 24 hr tablet Take 1,000 mg by mouth See admin instructions. Take 2 tablets (1000 mg) by mouth every morning, take 2 tablets (1000 mg) in the evening about twice a week. 08/03/21   [provider]  ?methotrexate (RHEUMATREX) 2.5 MG tablet Take 10 mg by mouth See admin instructions. Take 4 tablets (10 mg) by mouth on Friday and Saturday mornings (twice weekly) Caution:Chemotherapy. Protect from light.;    [provider]  ?metoprolol succinate (TOPROL-XL) 25 MG 24 hr tablet Take 12.5 mg by mouth every morning. 02/08/20   [provider]  ?Multiple Vitamin (MULTIVITAMIN WITH MINERALS) TABS tablet Take 1 tablet by mouth every morning.    [provider]  ?polyethylene glycol (MIRALAX / GLYCOLAX) 17 g packet Take 17 g by mouth daily. 02/16/22   Rai, Vernelle Emerald, MD  ?rosuvastatin (CRESTOR) 40 MG tablet Take 20 mg by mouth every morning. 12/23/21   [provider]  ?   ? ?Allergies    ?Patient has no known allergies.   ? ?Review of Systems   ?Review of Systems ? ?Physical Exam ?Updated Vital Signs ?BP 134/71 (BP Location: Right Arm)   Pulse (!) 123   Temp (!) 101.8 ?F (38.8 ?C) (Rectal)   Resp (!) 35   SpO2 (!) 87%  ?Physical Exam ?Vitals and nursing note reviewed.  ?Constitutional:   ?   General: He is not in acute distress. ?   Appearance: He is well-developed.  ?HENT:  ?  Head: Normocephalic and atraumatic.  ?   Nose: Nose normal.  ?   Mouth/Throat:  ?   Mouth: Mucous membranes are moist.  ?Eyes:  ?   General:     ?   Right eye: No discharge.     ?   Left eye: No discharge.  ?   Conjunctiva/sclera: Conjunctivae normal.  ?Cardiovascular:  ?   Rate and Rhythm: Regular rhythm. Tachycardia present.  ?   Heart sounds: Normal heart sounds.  ?Pulmonary:  ?   Effort: Pulmonary effort is normal.  ?   Breath sounds: Wheezing and rhonchi present. No rales.  ?   Comments: Significant rhonchi and wheezing throughout lung fields bilaterally, mild tachypnea, no increased work of  breathing ?Abdominal:  ?   Palpations: Abdomen is soft.  ?   Tenderness: There is no abdominal tenderness.  ?Musculoskeletal:  ?   Cervical back: Normal range of motion and neck supple.  ?   Comments: Left hip incisions covered with bandaging.  I gently pulled this back and did not see any evidence of drainage or cellulitis.  Patient does not report tenderness to palpation in this area.  ?Skin: ?   General: Skin is warm and dry.  ?Neurological:  ?   Mental Status: He is alert.  ? ? ?ED Results / Procedures / Treatments   ?Labs ?(all labs ordered are listed, but only abnormal results are displayed) ?Labs Reviewed  ?LACTIC ACID, PLASMA - Abnormal; Notable for the following components:  ?    Result Value  ? Lactic Acid, Venous 2.2 (*)   ? All other components within normal limits  ?COMPREHENSIVE METABOLIC PANEL - Abnormal; Notable for the following components:  ? Potassium 3.2 (*)   ? CO2 18 (*)   ? Glucose, Bld 102 (*)   ? Albumin 2.8 (*)   ? AST 52 (*)   ? Total Bilirubin 1.5 (*)   ? All other components within normal limits  ?CBC WITH DIFFERENTIAL/PLATELET - Abnormal; Notable for the following components:  ? WBC 13.3 (*)   ? RBC 4.13 (*)   ? MCH 34.9 (*)   ? Neutro Abs 11.9 (*)   ? Lymphs Abs 0.3 (*)   ? Abs Immature Granulocytes 0.11 (*)   ? All other components within normal limits  ?PROTIME-INR - Abnormal; Notable for the following components:  ? Prothrombin Time 17.3 (*)   ? INR 1.4 (*)   ? All other components within normal limits  ?BRAIN NATRIURETIC PEPTIDE - Abnormal; Notable for the following components:  ? B Natriuretic Peptide 210.3 (*)   ? All other components within normal limits  ?RESP PANEL BY RT-PCR (FLU A&B, COVID) [id number]  ?CULTURE, BLOOD (ROUTINE X 2)  ?CULTURE, BLOOD (ROUTINE X 2)  ?URINE CULTURE  ?MRSA NEXT GEN BY PCR, NASAL  ?APTT  ?LACTIC ACID, PLASMA  ?URINALYSIS, ROUTINE W REFLEX MICROSCOPIC  ? ? ?EKG ?EKG Interpretation ? ?Date/Time:  Saturday Feb 20 2022 10:22:08 EDT ?Ventricular Rate:   121 ?PR Interval:  158 ?QRS Duration: 84 ?QT Interval:  316 ?QTC Calculation: 449 ?R Axis:   -54 ?Text Interpretation: Sinus tachycardia Atrial premature complex LAD, consider LAFB or inferior infarct Low voltage, extremity leads Anteroseptal infarct, old Nonspecific T abnormalities, lateral leads Confirmed by Fredia Sorrow [phone]) on 02/20/2022 10:41:31 AM ? ?Radiology ?DG Chest Port 1 View ? ?Result Date: 02/20/2022 ?CLINICAL DATA:  Sepsis. EXAM: PORTABLE CHEST 1 VIEW COMPARISON:  02/17/2022 FINDINGS: 1047 hours. New atelectasis or pneumonia in  the infrahilar right lung base. Left lung clear. No pulmonary edema or pleural effusion. Telemetry leads overlie the chest. IMPRESSION: New airspace disease in the medial right base compatible with atelectasis or pneumonia. Electronically Signed   By: Misty Stanley M.D.   On: 02/20/2022 11:06   ? ?Procedures ?Procedures  ? ? ?Medications Ordered in ED ?Medications - No data to display ? ?ED Course/ Medical Decision Making/ A&P ?  ? ?Patient seen and examined. History obtained mainly from EMS and recent hospitalization records.  Patient is able to give simple responses to questions but is lethargic. Temp 101.3 rectal.  ? ?Labs/EKG: Ordered sepsis work-up including BNP, COVID testing. ? ?Imaging: Ordered chest x-ray. ? ?Medications/Fluids: Ordered: IV Rocephin and azithromycin for pneumonia coverage.. Considered administration of: 30cc/kg bolus however patient not currently hypotensive. Awaiting lactate. . ? ?Most recent vital signs reviewed and are as follows: ?There were no vitals taken for this visit. ? ?Initial impression: sepsis, suspect potentially respiratory cause.  ? ?Pt discussed in the interim with Dr. Rogene Houston.  ? ?10:57 AM Reassessment performed. Patient appears  ? ?Labs personally reviewed and interpreted including: CBC with elevated white blood cell count, INR elevated at 1.4. ? ?Imaging personally visualized and interpreted including: X-ray reviewed,  possible right basilar pneumonia. ? ?Reviewed pertinent lab work and imaging with patient and family now at bedside. Questions answered.  ? ?Most current vital signs reviewed and are as follows: ?BP 134/71 (BP Location:

## 2022-02-20 NOTE — Sepsis Progress Note (Signed)
Elink following code sepsis °

## 2022-02-21 DIAGNOSIS — J9601 Acute respiratory failure with hypoxia: Secondary | ICD-10-CM | POA: Diagnosis not present

## 2022-02-21 DIAGNOSIS — M069 Rheumatoid arthritis, unspecified: Secondary | ICD-10-CM

## 2022-02-21 DIAGNOSIS — Z7189 Other specified counseling: Secondary | ICD-10-CM

## 2022-02-21 DIAGNOSIS — E876 Hypokalemia: Secondary | ICD-10-CM

## 2022-02-21 DIAGNOSIS — G4733 Obstructive sleep apnea (adult) (pediatric): Secondary | ICD-10-CM

## 2022-02-21 DIAGNOSIS — E1169 Type 2 diabetes mellitus with other specified complication: Secondary | ICD-10-CM

## 2022-02-21 DIAGNOSIS — R6520 Severe sepsis without septic shock: Secondary | ICD-10-CM

## 2022-02-21 DIAGNOSIS — E785 Hyperlipidemia, unspecified: Secondary | ICD-10-CM

## 2022-02-21 DIAGNOSIS — I2510 Atherosclerotic heart disease of native coronary artery without angina pectoris: Secondary | ICD-10-CM

## 2022-02-21 DIAGNOSIS — R1310 Dysphagia, unspecified: Secondary | ICD-10-CM

## 2022-02-21 DIAGNOSIS — E440 Moderate protein-calorie malnutrition: Secondary | ICD-10-CM

## 2022-02-21 DIAGNOSIS — J690 Pneumonitis due to inhalation of food and vomit: Secondary | ICD-10-CM | POA: Diagnosis not present

## 2022-02-21 DIAGNOSIS — Z951 Presence of aortocoronary bypass graft: Secondary | ICD-10-CM

## 2022-02-21 DIAGNOSIS — F0390 Unspecified dementia without behavioral disturbance: Secondary | ICD-10-CM

## 2022-02-21 DIAGNOSIS — A419 Sepsis, unspecified organism: Secondary | ICD-10-CM

## 2022-02-21 DIAGNOSIS — R1312 Dysphagia, oropharyngeal phase: Secondary | ICD-10-CM

## 2022-02-21 DIAGNOSIS — R638 Other symptoms and signs concerning food and fluid intake: Secondary | ICD-10-CM

## 2022-02-21 DIAGNOSIS — S72002D Fracture of unspecified part of neck of left femur, subsequent encounter for closed fracture with routine healing: Secondary | ICD-10-CM

## 2022-02-21 DIAGNOSIS — I10 Essential (primary) hypertension: Secondary | ICD-10-CM

## 2022-02-21 DIAGNOSIS — Z9989 Dependence on other enabling machines and devices: Secondary | ICD-10-CM

## 2022-02-21 LAB — PROTIME-INR
INR: 1.4 — ABNORMAL HIGH (ref 0.8–1.2)
Prothrombin Time: 16.8 seconds — ABNORMAL HIGH (ref 11.4–15.2)

## 2022-02-21 LAB — CBC
HCT: 41.6 % (ref 39.0–52.0)
Hemoglobin: 13.6 g/dL (ref 13.0–17.0)
MCH: 33.3 pg (ref 26.0–34.0)
MCHC: 32.7 g/dL (ref 30.0–36.0)
MCV: 102 fL — ABNORMAL HIGH (ref 80.0–100.0)
Platelets: 361 10*3/uL (ref 150–400)
RBC: 4.08 MIL/uL — ABNORMAL LOW (ref 4.22–5.81)
RDW: 14 % (ref 11.5–15.5)
WBC: 12.9 10*3/uL — ABNORMAL HIGH (ref 4.0–10.5)
nRBC: 0 % (ref 0.0–0.2)

## 2022-02-21 LAB — GLUCOSE, CAPILLARY
Glucose-Capillary: 82 mg/dL (ref 70–99)
Glucose-Capillary: 78 mg/dL (ref 70–99)
Glucose-Capillary: 76 mg/dL (ref 70–99)
Glucose-Capillary: 94 mg/dL (ref 70–99)
Glucose-Capillary: 103 mg/dL — ABNORMAL HIGH (ref 70–99)

## 2022-02-21 LAB — COMPREHENSIVE METABOLIC PANEL
ALT: 31 U/L (ref 0–44)
AST: 60 U/L — ABNORMAL HIGH (ref 15–41)
Albumin: 2.7 g/dL — ABNORMAL LOW (ref 3.5–5.0)
Alkaline Phosphatase: 92 U/L (ref 38–126)
Anion gap: 14 (ref 5–15)
BUN: 23 mg/dL (ref 8–23)
CO2: 19 mmol/L — ABNORMAL LOW (ref 22–32)
Calcium: 9.5 mg/dL (ref 8.9–10.3)
Chloride: 111 mmol/L (ref 98–111)
Creatinine, Ser: 0.83 mg/dL (ref 0.61–1.24)
GFR, Estimated: >60 mL/min (ref >60)
Glucose, Bld: 88 mg/dL (ref 70–99)
Potassium: 3.8 mmol/L (ref 3.5–5.1)
Sodium: 144 mmol/L (ref 135–145)
Total Bilirubin: 1.4 mg/dL — ABNORMAL HIGH (ref 0.3–1.2)
Total Protein: 6.7 g/dL (ref 6.5–8.1)

## 2022-02-21 LAB — CORTISOL-AM, BLOOD: Cortisol - AM: 36.2 ug/dL — ABNORMAL HIGH (ref 6.7–22.6)

## 2022-02-21 LAB — PROCALCITONIN: Procalcitonin: 7.9 ng/mL

## 2022-02-21 MED ORDER — METOPROLOL TARTRATE 25 MG PO TABS
12.5000 mg | ORAL_TABLET | Freq: Two times a day (BID) | ORAL | Status: DC
  Administered 2022-02-21 – 2022-03-04 (×15): 12.5 mg via ORAL
  Filled 2022-02-21 (×19): qty 1

## 2022-02-21 MED ORDER — MAGIC MOUTHWASH W/LIDOCAINE
5.0000 mL | Freq: Three times a day (TID) | ORAL | Status: DC
  Administered 2022-02-21 – 2022-02-25 (×11): 5 mL via ORAL
  Filled 2022-02-21 (×12): qty 5

## 2022-02-21 MED ORDER — ENOXAPARIN SODIUM 40 MG/0.4ML IJ SOSY
40.0000 mg | PREFILLED_SYRINGE | INTRAMUSCULAR | Status: DC
  Administered 2022-02-21 – 2022-02-25 (×5): 40 mg via SUBCUTANEOUS
  Filled 2022-02-21 (×5): qty 0.4

## 2022-02-21 MED ORDER — METOCLOPRAMIDE HCL 5 MG/ML IJ SOLN
5.0000 mg | Freq: Four times a day (QID) | INTRAMUSCULAR | Status: DC | PRN
  Administered 2022-02-21: 5 mg via INTRAVENOUS
  Filled 2022-02-21: qty 2

## 2022-02-21 MED ORDER — FLEET ENEMA 7-19 GM/118ML RE ENEM: 1.0000 | ENEMA | Freq: Every day | RECTAL | Status: DC | PRN

## 2022-02-21 MED ORDER — SENNOSIDES-DOCUSATE SODIUM 8.6-50 MG PO TABS
1.0000 | ORAL_TABLET | Freq: Two times a day (BID) | ORAL | Status: DC | PRN
Start: 2022-02-21 — End: 2022-03-05

## 2022-02-21 MED ORDER — SODIUM CHLORIDE 0.9 % IV SOLN
2.0000 g | Freq: Three times a day (TID) | INTRAVENOUS | Status: DC
  Administered 2022-02-21 – 2022-02-22 (×2): 2 g via INTRAVENOUS
  Filled 2022-02-21 (×3): qty 12.5

## 2022-02-21 MED ORDER — METRONIDAZOLE 500 MG/100ML IV SOLN
500.0000 mg | Freq: Two times a day (BID) | INTRAVENOUS | Status: DC
  Administered 2022-02-21 (×2): 500 mg via INTRAVENOUS
  Filled 2022-02-21 (×2): qty 100

## 2022-02-21 NOTE — Assessment & Plan Note (Addendum)
Per patient's wife, diagnosed with dementia about 3 years ago and his cognition has been declining lately.  He is oriented to self, place and person. ?-Reorientation and delirium precaution ?-Avoid or minimize sedating medications. ?

## 2022-02-21 NOTE — Hospital Course (Addendum)
76 year old M with PMH of dementia, CAD/CABG, DM-2, HTN, OSA on CPAP, recent hospitalization for left hip fracture after mechanical fall for which he underwent closed reduction with percutaneous pin fixation on 5/5 and discharged to SNF on 5/10 returning with shortness of breath, fever, poor p.o. intake, solid dysphagia, lethargy and hypoxia to 80s, and admitted for severe sepsis with acute hypoxic respiratory failure due to RLL pneumonia concerning for aspiration pneumonia given patient's dysphagia.  Initially started on IV cefepime and NRB.  IV Flagyl added the next day. ? ?Blood culture, urine culture, MRSA PCR, COVID-19 and influenza PCR negative.  Antibiotics de-escalated to IV Unasyn.  Therapy recommends dysphagia 2 diet.  Respiratory failure improved.  Currently saturating in mid 90s on 4 L by .  Poor long-term prognosis.  Palliative medicine following. ?

## 2022-02-21 NOTE — Progress Notes (Signed)
PHARMACY NOTE:  ANTIMICROBIAL RENAL DOSAGE ADJUSTMENT ? ?Current antimicrobial regimen includes a mismatch between antimicrobial dosage and estimated renal function.  As per policy approved by the Pharmacy & Therapeutics and Medical Executive Committees, the antimicrobial dosage will be adjusted accordingly. ? ?Current antimicrobial dosage:  Cefepime 2g IV q12h ? ?Indication: severe sepsis due to aspiration pneumonia ? ?Renal Function: ? ?Estimated Creatinine Clearance: 74.1 mL/min (by C-G formula based on SCr of 0.83 mg/dL). ?'[]'$      On intermittent HD, scheduled: ?'[]'$      On CRRT ?   ?Antimicrobial dosage has been changed to:  Cefepime 2g IV q8h ? ? ?Thank you for allowing pharmacy to be a part of this patient's care. ? ?Luiz Ochoa, [facility] ?02/21/2022 12:09 PM ? ? ?  ? ?

## 2022-02-21 NOTE — TOC Progression Note (Addendum)
Transition of Care (TOC) - Progression Note  ? ? ?Patient Details  ?Name: Derek [name] ?MRN: [id number] ?Date of Birth: 06-Mar-1946 ? ?Transition of Care (TOC) CM/SW Contact  ?Ross Ludwig, LCSW ?Phone Number: ?02/21/2022, 3:20 PM ? ?Clinical Narrative:    ? ?Patient from [facility] under short term rehab.  Will need reauthorization.  ? ?Expected Discharge Plan and Services ?  ?Patient should return back to [facility] under rehab. ?  ?  ?  ?  ?                ?  ?  ?  ?  ?  ?  ?  ?  ?  ?  ? ? ?Social Determinants of Health (SDOH) Interventions ?  ? ?Readmission Risk Interventions ?   ? View : No data to display.  ?  ?  ?  ? ? ?

## 2022-02-21 NOTE — Assessment & Plan Note (Addendum)
K3.2.  Hypophosphatemia resolved. ?-P.o. KCl 40x2 ?-IV fluid with KCl ?

## 2022-02-21 NOTE — Assessment & Plan Note (Signed)
Hold home methotrexate. ?

## 2022-02-21 NOTE — Assessment & Plan Note (Deleted)
Denies chest pain.  Seems to be stable. ?-Continue home medication ?

## 2022-02-21 NOTE — Assessment & Plan Note (Addendum)
Had closed reduction with percutaneous pin fixation on 5/5 and discharged to SNF on 5/10.  Surgical dressing in place. ?-WBAT, posterior hip precaution and PT/OT ?-Use Lovenox for VTE prophylaxis while in-house.  Full dose aspirin on discharge ?-Outpatient follow-up with orthopedic surgeon as previously planned ?

## 2022-02-21 NOTE — Assessment & Plan Note (Signed)
Denies chest pain.  Seems to be stable. ?-Continue home medication ?

## 2022-02-21 NOTE — Assessment & Plan Note (Addendum)
Extensive goals of care discussion with patient's wife, daughter and son-in-law at bedside today.  Patient likes to take some time to think about it.  Palliative on board as well. ?-Remains full code with full scope of care ?

## 2022-02-21 NOTE — Assessment & Plan Note (Signed)
-  Continue nightly CPAP °

## 2022-02-21 NOTE — Progress Notes (Signed)
Patient's wife attempted to give patient a drink of coffee, he began coughing and spit it up along with small amount of thick mucus ?

## 2022-02-21 NOTE — Assessment & Plan Note (Addendum)
Some pain and struggle with solid lately.  No apparent oral region on exam but difficult to assess pharynx ?-Aspiration precaution and dysphagia 3 diet ?-Trial of Magic mouthwash with lidocaine ?

## 2022-02-21 NOTE — Assessment & Plan Note (Signed)
Likely from sepsis.  Continue monitoring. ?

## 2022-02-21 NOTE — Evaluation (Signed)
Clinical/Bedside Swallow Evaluation ?Patient Details  ?Name: Derek [name] ?MRN: [id number] ?Date of Birth: [birth year] ? ?Today's Date: 02/21/2022 ?Time: SLP Start Time (ACUTE ONLY): 7902 SLP Stop Time (ACUTE ONLY): 1413 ?SLP Time Calculation (min) (ACUTE ONLY): 15 min ? ?Past Medical History:  ?Past Medical History:  ?Diagnosis Date  ? Arthritis   ? Coronary artery disease   ? Diabetes mellitus   ? type 2  ? DJD (degenerative joint disease)   ? R hip DJD  ? H/O: upper GI bleed 01/2001   ? related to nonsteroidal use, CLO test +(Med Tx- Prilosec, Amoxicillin, & Biaxin)  ? Hypertension   ? Insomnia   ? Mental disorder   ? PTSD  ? Peptic ulcer disease   ? history of gastric ulcer  ? PTSD (post-traumatic stress disorder)   ? [facility]  ? Sarcoidosis   ? late 1970s  ? Scleritis   ? Sleep apnea   ? cpap with O2 4L for 4 yrs  ? Tubular adenoma 2004  ? ?Past Surgical History:  ?Past Surgical History:  ?Procedure Laterality Date  ? CARDIAC CATHETERIZATION    ? CORONARY ARTERY BYPASS GRAFT  12/02/2017  ? at [facility] Dr Lunette Stands Surgeon  ? EYE SURGERY  05  ? growth lft  ? HIP PINNING,CANNULATED Left 02/12/2022  ? Procedure: CANNULATED HIP PINNING;  Surgeon: Erle Crocker, MD;  Location: [facility];  Service: Orthopedics;  Laterality: Left;  ? TOTAL HIP ARTHROPLASTY  06/13/2012  ? TOTAL HIP ARTHROPLASTY  06/13/2012  ? Procedure: TOTAL HIP ARTHROPLASTY ANTERIOR APPROACH;  Surgeon: Hessie Dibble, MD;  Location: [place];  Service: Orthopedics;  Laterality: Right;  right anterior approach total hip arthroplasty  ? ?HPI:  ?Pt is a 76 y.o. male who presented with dyspnea and fever. CXR 5/14: New airspace disease in the medial right base compatible with atelectasis or pneumonia. Per RN note 5/14, pt given coffee and "he began coughing and spit it up along with small amount of thick mucus." PMH: dementia, CAD s/p CABG, DM2, HTN, OSA on CPAP, mild cognitive impairment.  ?  ?Assessment / Plan / Recommendation  ?Clinical Impression ? Pt was seen  for bedside swallow evaluation with his family present. Pt's wife denied the pt having any difficulty with p.o. intake prior to admission and she stated that the pt typically consumes regular texture solids and thin liquids. Oral mechanism exam was limited due to pt's difficulty following commands; however, oral motor strength and ROM appeared grossly WFL. Dentition was reduced. No overt s/sx of aspiration were noted with purees or thin liquids. He demonstrated reduced bolus awareness with oral holding of purees. Oral holding persisted despite prompts and cues, but oral clearance was facilitated with use of a liquid wash. Pt's wife reported that the pt's cognition is acutely worse and his current dysphagia appears to be cognitively based. A clear liquid diet will be initiated at this time; SLP will follow to ensure tolerance, and for advancement as clinically indicated. ?SLP Visit Diagnosis: Dysphagia, unspecified (R13.10) ?   ?Aspiration Risk ? Mild aspiration risk  ?  ?Diet Recommendation Thin liquid (clear liquids)  ? ?Liquid Administration via: Cup;Straw ?Medication Administration: Whole meds with puree (or crushed with puree and followed by liquids.) ?Supervision: Staff to assist with self feeding ?Compensations: Slow rate;Small sips/bites ?Postural Changes: Seated upright at 90 degrees  ?  ?Other  Recommendations Oral Care Recommendations: Oral care BID   ? ?Recommendations for follow up therapy are one component of a  multi-disciplinary discharge planning process, led by the attending physician.  Recommendations may be updated based on patient status, additional functional criteria and insurance authorization. ? ?Follow up Recommendations  (TBD)  ? ? ?  ?Assistance Recommended at Discharge Frequent or constant Supervision/Assistance  ?Functional Status Assessment Patient has had a recent decline in their functional status and demonstrates the ability to make significant improvements in function in a reasonable  and predictable amount of time.  ?Frequency and Duration min 2x/week  ?2 weeks ?  ?   ? ?Prognosis Prognosis for Safe Diet Advancement: Good ?Barriers to Reach Goals: Cognitive deficits  ? ?  ? ?Swallow Study   ?General Date of Onset: 02/20/22 ?HPI: Pt is a 76 y.o. male who presented with dyspnea and fever. CXR 5/14: New airspace disease in the medial right base compatible with atelectasis or pneumonia. Per RN note 5/14, pt given coffee and "he began coughing and spit it up along with small amount of thick mucus." PMH: dementia, CAD s/p CABG, DM2, HTN, OSA on CPAP, mild cognitive impairment. ?Type of Study: Bedside Swallow Evaluation ?Previous Swallow Assessment: none ?Diet Prior to this Study: Dysphagia 1 (puree);Thin liquids ?Temperature Spikes Noted: No ?Respiratory Status: Nasal cannula ?History of Recent Intubation: No ?Behavior/Cognition: Alert;Pleasant mood;Cooperative;Doesn't follow directions;Requires cueing ?Oral Cavity Assessment: Within Functional Limits ?Oral Care Completed by SLP: No ?Vision: Functional for self-feeding ?Self-Feeding Abilities: Needs assist ?Patient Positioning: Upright in bed;Postural control adequate for testing ?Baseline Vocal Quality: Normal ?Volitional Cough: Cognitively unable to elicit ?Volitional Swallow: Able to elicit  ?  ?Oral/Motor/Sensory Function Overall Oral Motor/Sensory Function: Within functional limits   ?Ice Chips Ice chips: Within functional limits ?Presentation: Spoon   ?Thin Liquid Thin Liquid: Within functional limits ?Presentation: Cup;Straw  ?  ?Nectar Thick Nectar Thick Liquid: Not tested   ?Honey Thick Honey Thick Liquid: Not tested   ?Puree Puree: Impaired ?Presentation: Spoon ?Oral Phase Impairments: Poor awareness of bolus ?Oral Phase Functional Implications: Oral holding   ?Solid ? ? ?  Solid: Not tested  ? ?  ?Derek [name] I. Derek [name], Derek [name], Derek [name] ?Acute Rehabilitation Services ?Office number [phone] ?Pager [phone] ? ?Derek [name] ?02/21/2022,2:26 PM ? ? ? ? ? ?

## 2022-02-21 NOTE — Assessment & Plan Note (Addendum)
A1c 5.1% about a week ago.  On Jardiance, metformin and Crestor at home. ?Recent Labs  ?Lab 02/22/22 ?1149 02/22/22 ?1631 02/22/22 ?2129 02/23/22 ?3128 02/23/22 ?1201  ?GLUCAP 121* 102* 91 113* 149*  ?-Continue SSI-sensitive ?-Hold home Jardiance and metformin ?-Hold Crestor in the setting of elevated LFT ? ?

## 2022-02-21 NOTE — Assessment & Plan Note (Addendum)
Likely due to acute illness, dysphagia and dementia.   ?Nutrition Problem: Moderate Malnutrition ?Etiology: chronic illness (dementia) ?Signs/Symptoms: moderate fat depletion, moderate muscle depletion ?Interventions: Ensure Enlive (each supplement provides 350kcal and 20 grams of protein), MVI ?

## 2022-02-21 NOTE — Assessment & Plan Note (Addendum)
Febrile, tachycardic, tachypneic, hypoxic with leukocytosis, lactic acidosis and mental status change on presentation.  Imaging suggest RLL infiltrate concerning for aspiration pneumonia given patient's dementia and dysphagia.  Sepsis physiology and respiratory failure improved.  Blood and urine cultures NGTD.  MRSA, COVID-19 and influenza PCR reactive. Patient is immunocompromised on methotrexate for his rheumatoid arthritis. ?-Cefepime and Flagyl >> IV Unasyn>> ?-Aspiration precaution ?-Dysphagia 2 diet per SLP ?-Aspiration precaution ?-Ongoing goals of care discussion ?

## 2022-02-21 NOTE — Progress Notes (Signed)
?PROGRESS NOTE ? ?Derek [name] ULA:[id number] DOB: 1945-12-08  ? ?PCP: Lavone Orn, MD ? ?Patient is from: SNF.  [facility] ? ?DOA: 02/20/2022 LOS: 1 ? ?Chief complaints ?Chief Complaint  ?Patient presents with  ? Fever  ?  ? ?Brief Narrative / Interim history: ?76 year old M with PMH of dementia, CAD/CABG, DM-2, HTN, OSA on CPAP, recent hospitalization for left hip fracture after mechanical fall for which he underwent closed reduction with percutaneous pin fixation on 5/5 and discharged to SNF on 5/10 returning with shortness of breath, fever, poor p.o. intake, solid dysphagia, lethargy and hypoxia to 80s, and admitted for severe sepsis with acute hypoxic respiratory failure due to RLL pneumonia.  He was started on IV cefepime and NRB.   ? ?Subjective: ?Seen and examined earlier this morning.  No major events overnight of this morning.  Patient has no complaints but not a reliable historian.  He is awake but only oriented to self and his wife.  He thinks he is at [employer].  Does not have insight into why he is here.  Patient's wife at bedside. ? ?Objective: ?Vitals:  ? 02/21/22 0800 02/21/22 0802 02/21/22 1000 02/21/22 1200  ?BP: 128/83  136/86 (!) 144/90  ?Pulse: (!) 113  (!) 122 (!) 121  ?Resp: (!) 23  (!) 25 (!) 21  ?Temp: 98 ?F (36.7 ?C)  99.4 ?F (37.4 ?C) 99.4 ?F (37.4 ?C)  ?TempSrc: Axillary  Oral Oral  ?SpO2: 93% 94% 93% 94%  ?Weight:      ?Height:      ? ? ?Examination: ? ?GENERAL: No apparent distress.  Nontoxic. ?HEENT: MMM.  Vision and hearing grossly intact.  ?NECK: Supple.  No apparent JVD.  ?RESP: Tachypneic to 20s.  93% on a liter.  Rhonchi and crackles bilaterally. ?CVS: Tachycardic to 120s.  Heart sounds normal.  ?ABD/GI/GU: BS+. Abd soft, NTND.  ?MSK/EXT:  Moves extremities. No apparent deformity. No edema.  ?SKIN: no apparent skin lesion or wound ?NEURO: Awake and oriented to self and his wife but not place or time.  Follows commands.  No apparent focal neuro deficit. ?PSYCH: Calm. Normal  affect.  ? ?Procedures:  ?None ? ?Microbiology summarized: ?COVID-19, influenza and MRSA PCR nonreactive. ?Blood cultures NGTD. ?Urine culture pending ? ?Assessment and Plan: ?* Severe sepsis due to aspiration pneumonia ?Febrile, tachycardic, tachypneic, hypoxic with leukocytosis, lactic acidosis and mental status change on presentation.  Imaging suggest RLL infiltrate concerning for aspiration pneumonia given patient's dementia and dysphagia.  He is still tachycardic and tachypneic but improved O2 requirement.  Currently on 8 L by HFNC.  Blood cultures NGTD.  MRSA, COVID-19 and influenza PCR reactive.  Procalcitonin 7.9.  Patient is immunocompromised on methotrexate for his rheumatoid arthritis. ?-Continue IV cefepime given recent hospitalization ?-Add IV Flagyl for anaerobic coverage given risk for aspiration ?-Aspiration precaution ?-SLP eval ? ?Acute respiratory failure with hypoxia ([place]) ?Likely due to aspiration pneumonia and sepsis.  Desaturated to 87% on RA.  He was a started on 15 L by NRB and recovered to mid 90s.  Currently on 8 L by HFNC. ?-Monitor aspiration pneumonia as above ?-Wean oxygen as able ? ?Goals of care, counseling/discussion ?Extensive discussion with patient's wife at bedside about CODE STATUS including pros and cons of CPR and intubation.  Patient has significant cognitive impairment to participate or comprehend such complex conversation.  I do not believe CPR or intubation in part to the patient's best interest if we happen to be in that situation.  I recommended  DNR/DNI.  Patient's wife voiced understanding but she would like to discuss this further with their children before making decision.  I offered palliative care consult and she appreciated. ?-Palliative medicine consulted. ? ?Decreased oral intake ?Likely due to acute illness, dysphagia and dementia.   ?-Consult dietitian ? ?Dysphagia and odynophagia ?Some pain and struggle with solid lately.  No apparent oral region on exam but  difficult to assess pharynx ?-Aspiration precaution ?-Dysphagia 1 diet pending SLP eval ?-Trial of Magic mouthwash with lidocaine ? ?Dementia without behavioral disturbance ([place]) ?Per patient's wife, diagnosed with dementia about 3 years ago and his cognition has been declining lately.  He is awake but only oriented to self and his wife. ?-Reorientation and delirium precaution ?-Avoid or minimize sedating medications. ? ?Closed left hip fracture ([place]) ?Had closed reduction with percutaneous pin fixation on 5/5 and discharged to SNF on 5/10.  Surgical dressing in place. ?-WBAT, posterior hip precaution and PT/OT ?-Use Lovenox for VTE prophylaxis while in-house.  Full dose aspirin on discharge ?-Outpatient follow-up with orthopedic surgeon as previously planned ? ?Hyperbilirubinemia ?Likely from sepsis.  Continue monitoring. ? ?Hypokalemia ?Resolved. ? ?Rheumatoid arthritis ([place]) ?Hold home methotrexate. ? ?Benign essential hypertension ?Normotensive. ?-Resume home metoprolol given tachycardia ? ?CAD s/p CABG in 2019 at [facility] ?Denies chest pain.  Seems to be stable. ?-Continue home medication ? ?Controlled NIDDM-2 with hyperlipidemia ?A1c 5.1% about a week ago.  On Jardiance, metformin and Crestor at home. ?Recent Labs  ?Lab 02/17/22 ?1158 02/20/22 ?1614 02/20/22 ?2009 02/21/22 ?2440 02/21/22 ?0740  ?GLUCAP 215* 106* 193* 82 78  ?-Continue SSI-sensitive ?-Hold home Jardiance and metformin ?-Continue Crestor ? ? ?OSA on CPAP ?Continue nightly CPAP ? ? ? ?DVT prophylaxis:  ?enoxaparin (LOVENOX) injection 40 mg Start: 02/21/22 1400 ?SCDs Start: 02/20/22 1530 ? ?Code Status: Full code ?Family Communication: Updated patient's wife at bedside. ?Level of care: Progressive ?Status is: Inpatient ?Remains inpatient appropriate because: Severe sepsis and acute respiratory failure with hypoxia due to aspiration pneumonia ? ? ?Final disposition: Likely SNF once medically stable. ?Consultants:  ?Palliative medicine ? ?Sch Meds:   ?Scheduled Meds: ? docusate sodium  100 mg Oral BID  ? enoxaparin (LOVENOX) injection  40 mg Subcutaneous Q24H  ? feeding supplement  237 mL Oral BID BM  ? FLUoxetine  20 mg Oral q morning  ? folic acid-pyridoxine-cyancobalamin  1 tablet Oral Daily  ? guaiFENesin  600 mg Oral BID  ? insulin aspart  0-9 Units Subcutaneous TID WC  ? levalbuterol  0.63 mg Nebulization Q6H  ? magic mouthwash w/lidocaine  5 mL Oral TID  ? metoprolol tartrate  12.5 mg Oral BID  ? polyethylene glycol  17 g Oral Daily  ? rosuvastatin  20 mg Oral QHS  ? ?Continuous Infusions: ? ceFEPime (MAXIPIME) IV    ? metronidazole 500 mg (02/21/22 1018)  ? ?PRN Meds:.acetaminophen **OR** acetaminophen, HYDROcodone-acetaminophen, ondansetron **OR** ondansetron (ZOFRAN) IV, senna-docusate, sodium phosphate ? ?Antimicrobials: ?Anti-infectives (From admission, onward)  ? ? Start     Dose/Rate Route Frequency Ordered Stop  ? 02/21/22 1800  ceFEPIme (MAXIPIME) 2 g in sodium chloride 0.9 % 100 mL IVPB       ? 2 g ?200 mL/hr over 30 Minutes Intravenous Every 8 hours 02/21/22 1210    ? 02/21/22 1000  ceFEPIme (MAXIPIME) 2 g in sodium chloride 0.9 % 100 mL IVPB  Status:  Discontinued       ? 2 g ?200 mL/hr over 30 Minutes Intravenous Every 12 hours 02/20/22 1329  02/20/22 1746  ? 02/21/22 1000  metroNIDAZOLE (FLAGYL) IVPB 500 mg       ? 500 mg ?100 mL/hr over 60 Minutes Intravenous Every 12 hours 02/21/22 0751 02/26/22 0959  ? 02/20/22 1900  ceFEPIme (MAXIPIME) 2 g in sodium chloride 0.9 % 100 mL IVPB  Status:  Discontinued       ? 2 g ?200 mL/hr over 30 Minutes Intravenous Every 12 hours 02/20/22 1746 02/21/22 1210  ? 02/20/22 1345  vancomycin (VANCOREADY) IVPB 1500 mg/300 mL       ? 1,500 mg ?150 mL/hr over 120 Minutes Intravenous STAT 02/20/22 1336 02/20/22 1644  ? 02/20/22 1030  cefTRIAXone (ROCEPHIN) 2 g in sodium chloride 0.9 % 100 mL IVPB  Status:  Discontinued       ? 2 g ?200 mL/hr over 30 Minutes Intravenous Every 24 hours 02/20/22 1023 02/20/22 1329  ?  02/20/22 1030  azithromycin (ZITHROMAX) 500 mg in sodium chloride 0.9 % 250 mL IVPB  Status:  Discontinued       ? 500 mg ?250 mL/hr over 60 Minutes Intravenous Every 24 hours 02/20/22 1023 02/20/22 1329  ? ?

## 2022-02-21 NOTE — Assessment & Plan Note (Addendum)
Likely due to aspiration pneumonia and sepsis.  Desaturated to 87% on RA.  He was a started on 15 L by NRB and recovered to mid 90s.  Currently saturating in 90s on 4 L. ?-Manage aspiration pneumonia as above ?-Wean oxygen as able ?-CPAP at night ?

## 2022-02-21 NOTE — Assessment & Plan Note (Addendum)
Normotensive. ?-Continue home metoprolol. ?-Continue holding Lasix ?

## 2022-02-21 NOTE — Evaluation (Signed)
Physical Therapy Evaluation ?Patient Details ?Name: Derek [name] ?MRN: [id number] ?DOB: 12/17/1945 ?Today's Date: 02/21/2022 ? ?History of Present Illness ? Derek [name] is a 76 y.o. male with medical history significant of CAD s/p CABG, DM2, HTN, OSA on CPAP, mild cognitive impairment. Presenting with dyspnea and fever. Recently admitted for hip fracture, s/p ORIF. Was sent to SNF for rehab on 5/10. While there he apparently became dyspneic and febrile. admitted with RLL pna  ?Clinical Impression ? Pt admitted with above diagnosis.  ?Per wife pt was doing well at rehab and amb more. Today he is requiring 2 person assist for bed mobility, VS with sitting EOB. Pt on 8L HFNC and very sleepy. Follows commands inconsistently, baseline cognitive impairment. Family considering Palliative Care Consult. Will continue to follow in acute setting.  ?Recommend SNF unless [facility] done and GOC adjusted, will update PT rec's accordingly. ? Pt currently with functional limitations due to the deficits listed below (see PT Problem List). Pt will benefit from skilled PT to increase their independence and safety with mobility to allow discharge to the venue listed below.   ?   ?   ? ?Recommendations for follow up therapy are one component of a multi-disciplinary discharge planning process, led by the attending physician.  Recommendations may be updated based on patient status, additional functional criteria and insurance authorization. ? ?Follow Up Recommendations Skilled nursing-short term rehab (<3 hours/day) ? ?  ?Assistance Recommended at Discharge Frequent or constant Supervision/Assistance  ?Patient can return home with the following ? Two people to help with walking and/or transfers;Two people to help with bathing/dressing/bathroom;Direct supervision/assist for medications management;Help with stairs or ramp for entrance;Assist for transportation;Assistance with feeding;Direct supervision/assist for financial  management;Assistance with cooking/housework ? ?  ?Equipment Recommendations None recommended by PT  ?Recommendations for Other Services ?    ?  ?Functional Status Assessment Patient has had a recent decline in their functional status and demonstrates the ability to make significant improvements in function in a reasonable and predictable amount of time.  ? ?  ?Precautions / Restrictions Precautions ?Precautions: Fall;Other (comment) ?Precaution Comments: pt on 8L HFNC, monitor VS ?Restrictions ?Weight Bearing Restrictions: No ?LLE Weight Bearing: Weight bearing as tolerated ?Other Position/Activity Restrictions: per previous notes recent admission for L hip fx  ? ?  ? ?Mobility ? Bed Mobility ?Overal bed mobility: Needs Assistance ?Bed Mobility: Supine to Sit, Sit to Supine ?  ?  ?Supine to sit: Mod assist, Max assist ?Sit to supine: Max assist, Total assist, +2 for physical assistance, +2 for safety/equipment ?  ?General bed mobility comments: assist to progress LEs off bed to come to sitting. verbal cues to assist self. 2 person assist to control descent of trunk and lift LEs on to bed. total assist to scoot up in supine ?  ? ?Transfers ?  ?  ?  ?  ?  ?  ?  ?  ?  ?  ?  ? ?Ambulation/Gait ?  ?  ?  ?  ?  ?  ?  ?  ? ?Stairs ?  ?  ?  ?  ?  ? ?Wheelchair Mobility ?  ? ?Modified Rankin (Stroke Patients Only) ?  ? ?  ? ?Balance   ?  ?  ?  ?  ?  ?  ?  ?  ?  ?  ?  ?  ?  ?  ?  ?  ?  ?  ?   ? ? ? ?Pertinent Vitals/Pain  Pain Assessment ?Pain Assessment: No/denies pain  ? ? ?Home Living Family/patient expects to be discharged to:: Skilled nursing facility ?  ?Available Help at Discharge: Family ?Type of Home: House ?Home Access: Ramped entrance ?  ?  ?Alternate Level Stairs-Number of Steps: 13 with stair lift ?Home Layout: Two level ?Home Equipment: Rollator (4 wheels);Shower seat;Cane - single point ?   ?  ?Prior Function Prior Level of Function : Independent/Modified Independent ?  ?  ?  ?  ?  ?  ?Mobility Comments: Amb  with cane or rollator depending on the day after hip fx ?ADLs Comments: not driving, reports independence with ADLs ?  ? ? ?Hand Dominance  ?   ? ?  ?Extremity/Trunk Assessment  ? Upper Extremity Assessment ?Upper Extremity Assessment: Defer to OT evaluation;Generalized weakness ?  ? ?Lower Extremity Assessment ?Lower Extremity Assessment: Generalized weakness ?  ? ?   ?Communication  ? Communication: No difficulties  ?Cognition Arousal/Alertness: Lethargic ?Behavior During Therapy: Flat affect ?Overall Cognitive Status: History of cognitive impairments - at baseline ?  ?  ?  ?  ?  ?  ?  ?  ?  ?  ?  ?  ?  ?  ?  ?  ?General Comments: verbalizes very little. very sleep but  arouses to name/voice. ?  ?  ? ?  ?General Comments   ? ?  ?Exercises    ? ?Assessment/Plan  ?  ?PT Assessment Patient needs continued PT services  ?PT Problem List Decreased strength;Decreased range of motion;Decreased activity tolerance;Decreased balance;Decreased mobility;Decreased safety awareness;Decreased knowledge of use of DME ? ?   ?  ?PT Treatment Interventions DME instruction;Gait training;Functional mobility training;Neuromuscular re-education;Balance training;Therapeutic exercise;Therapeutic activities;Patient/family education   ? ?PT Goals (Current goals can be found in the Care Plan section)  ?Acute Rehab PT Goals ?Patient Stated Goal: Go back  to rehab to get stronger before coming home ?PT Goal Formulation: With family ?Time For Goal Achievement: 03/12/22 ?Potential to Achieve Goals: Good ? ?  ?Frequency Min 3X/week ?  ? ? ?Co-evaluation   ?  ?  ?  ?  ? ? ?  ?AM-PAC PT "6 Clicks" Mobility  ?Outcome Measure Help needed turning from your back to your side while in a flat bed without using bedrails?: Total ?Help needed moving from lying on your back to sitting on the side of a flat bed without using bedrails?: Total ?Help needed moving to and from a bed to a chair (including a wheelchair)?: Total ?Help needed standing up from a chair  using your arms (e.g., wheelchair or bedside chair)?: Total ?Help needed to walk in hospital room?: Total ?Help needed climbing 3-5 steps with a railing? : Total ?6 Click Score: 6 ? ?  ?End of Session   ?Activity Tolerance: Patient limited by fatigue;Patient limited by lethargy ?Patient left: in bed;with call bell/phone within reach;with bed alarm set;with family/visitor present ?  ?PT Visit Diagnosis: Muscle weakness (generalized) (M62.81);Difficulty in walking, not elsewhere classified (R26.2) ?  ? ?Time: 3151-7616 ?PT Time Calculation (min) (ACUTE ONLY): 32 min ? ? ?Charges:   PT Evaluation ?$PT Eval Low Complexity: 1 Low ?PT Treatments ?$Therapeutic Activity: 8-22 mins ?  ?   ? ? ?Baxter Flattery, PT ? ?Acute Rehab Dept [facility]) [phone] ?Pager [phone] ? ?02/21/2022 ? ? ?Elonda Giuliano ?02/21/2022, 4:48 PM ? ?

## 2022-02-22 DIAGNOSIS — R638 Other symptoms and signs concerning food and fluid intake: Secondary | ICD-10-CM | POA: Diagnosis not present

## 2022-02-22 DIAGNOSIS — R6520 Severe sepsis without septic shock: Secondary | ICD-10-CM

## 2022-02-22 DIAGNOSIS — A419 Sepsis, unspecified organism: Principal | ICD-10-CM

## 2022-02-22 DIAGNOSIS — E876 Hypokalemia: Secondary | ICD-10-CM

## 2022-02-22 DIAGNOSIS — G4733 Obstructive sleep apnea (adult) (pediatric): Secondary | ICD-10-CM

## 2022-02-22 DIAGNOSIS — R748 Abnormal levels of other serum enzymes: Secondary | ICD-10-CM

## 2022-02-22 DIAGNOSIS — Z7189 Other specified counseling: Secondary | ICD-10-CM

## 2022-02-22 DIAGNOSIS — M069 Rheumatoid arthritis, unspecified: Secondary | ICD-10-CM

## 2022-02-22 DIAGNOSIS — J690 Pneumonitis due to inhalation of food and vomit: Secondary | ICD-10-CM

## 2022-02-22 DIAGNOSIS — Z951 Presence of aortocoronary bypass graft: Secondary | ICD-10-CM

## 2022-02-22 DIAGNOSIS — I10 Essential (primary) hypertension: Secondary | ICD-10-CM

## 2022-02-22 DIAGNOSIS — E1169 Type 2 diabetes mellitus with other specified complication: Secondary | ICD-10-CM

## 2022-02-22 DIAGNOSIS — F0390 Unspecified dementia without behavioral disturbance: Secondary | ICD-10-CM

## 2022-02-22 DIAGNOSIS — I2510 Atherosclerotic heart disease of native coronary artery without angina pectoris: Secondary | ICD-10-CM

## 2022-02-22 DIAGNOSIS — J9601 Acute respiratory failure with hypoxia: Secondary | ICD-10-CM

## 2022-02-22 DIAGNOSIS — Z789 Other specified health status: Secondary | ICD-10-CM

## 2022-02-22 DIAGNOSIS — Z9989 Dependence on other enabling machines and devices: Secondary | ICD-10-CM

## 2022-02-22 DIAGNOSIS — E440 Moderate protein-calorie malnutrition: Secondary | ICD-10-CM

## 2022-02-22 DIAGNOSIS — Z515 Encounter for palliative care: Secondary | ICD-10-CM

## 2022-02-22 DIAGNOSIS — S72002D Fracture of unspecified part of neck of left femur, subsequent encounter for closed fracture with routine healing: Secondary | ICD-10-CM

## 2022-02-22 DIAGNOSIS — R1312 Dysphagia, oropharyngeal phase: Secondary | ICD-10-CM

## 2022-02-22 DIAGNOSIS — E785 Hyperlipidemia, unspecified: Secondary | ICD-10-CM

## 2022-02-22 LAB — MAGNESIUM: Magnesium: 2.1 mg/dL (ref 1.7–2.4)

## 2022-02-22 LAB — COMPREHENSIVE METABOLIC PANEL
ALT: 61 U/L — ABNORMAL HIGH (ref 0–44)
AST: 143 U/L — ABNORMAL HIGH (ref 15–41)
Albumin: 2.2 g/dL — ABNORMAL LOW (ref 3.5–5.0)
Alkaline Phosphatase: 92 U/L (ref 38–126)
Anion gap: 9 (ref 5–15)
BUN: 23 mg/dL (ref 8–23)
CO2: 22 mmol/L (ref 22–32)
Calcium: 9 mg/dL (ref 8.9–10.3)
Chloride: 109 mmol/L (ref 98–111)
Creatinine, Ser: 0.78 mg/dL (ref 0.61–1.24)
GFR, Estimated: >60 mL/min (ref >60)
Glucose, Bld: 81 mg/dL (ref 70–99)
Potassium: 3.3 mmol/L — ABNORMAL LOW (ref 3.5–5.1)
Sodium: 140 mmol/L (ref 135–145)
Total Bilirubin: 1.1 mg/dL (ref 0.3–1.2)
Total Protein: 6 g/dL — ABNORMAL LOW (ref 6.5–8.1)

## 2022-02-22 LAB — URINE CULTURE: Culture: NO GROWTH

## 2022-02-22 LAB — CBC
HCT: 45 % (ref 39.0–52.0)
Hemoglobin: 15.1 g/dL (ref 13.0–17.0)
MCH: 33.2 pg (ref 26.0–34.0)
MCHC: 33.6 g/dL (ref 30.0–36.0)
MCV: 98.9 fL (ref 80.0–100.0)
Platelets: 302 10*3/uL (ref 150–400)
RBC: 4.55 MIL/uL (ref 4.22–5.81)
RDW: 13.9 % (ref 11.5–15.5)
WBC: 11.7 10*3/uL — ABNORMAL HIGH (ref 4.0–10.5)
nRBC: 0 % (ref 0.0–0.2)

## 2022-02-22 LAB — PROCALCITONIN: Procalcitonin: 7.22 ng/mL

## 2022-02-22 LAB — PHOSPHORUS: Phosphorus: 1.6 mg/dL — ABNORMAL LOW (ref 2.5–4.6)

## 2022-02-22 LAB — GLUCOSE, CAPILLARY
Glucose-Capillary: 91 mg/dL (ref 70–99)
Glucose-Capillary: 121 mg/dL — ABNORMAL HIGH (ref 70–99)
Glucose-Capillary: 102 mg/dL — ABNORMAL HIGH (ref 70–99)
Glucose-Capillary: 91 mg/dL (ref 70–99)

## 2022-02-22 LAB — VITAMIN B12: Vitamin B-12: 1425 pg/mL — ABNORMAL HIGH (ref 180–914)

## 2022-02-22 LAB — CK: Total CK: 119 U/L (ref 49–397)

## 2022-02-22 MED ORDER — SODIUM CHLORIDE 0.9 % IV SOLN
3.0000 g | Freq: Four times a day (QID) | INTRAVENOUS | Status: DC
  Administered 2022-02-22 – 2022-02-26 (×17): 3 g via INTRAVENOUS
  Filled 2022-02-22 (×18): qty 8

## 2022-02-22 MED ORDER — POTASSIUM CHLORIDE 20 MEQ PO PACK
60.0000 meq | PACK | Freq: Once | ORAL | Status: AC
  Administered 2022-02-22: 60 meq via ORAL
  Filled 2022-02-22: qty 3

## 2022-02-22 MED ORDER — POTASSIUM PHOSPHATES 15 MMOLE/5ML IV SOLN
30.0000 mmol | Freq: Once | INTRAVENOUS | Status: AC
  Administered 2022-02-22: 30 mmol via INTRAVENOUS
  Filled 2022-02-22: qty 10

## 2022-02-22 MED ORDER — ENSURE ENLIVE PO LIQD
237.0000 mL | Freq: Three times a day (TID) | ORAL | Status: DC
  Administered 2022-02-23 – 2022-03-05 (×21): 237 mL via ORAL

## 2022-02-22 MED ORDER — CHLORPROMAZINE HCL 25 MG/ML IJ SOLN: 12.5000 mg | Freq: Three times a day (TID) | INTRAMUSCULAR | Status: DC | PRN

## 2022-02-22 MED ORDER — DEXTROSE IN LACTATED RINGERS 5 % IV SOLN
INTRAVENOUS | Status: DC
Start: 2022-02-22 — End: 2022-02-23

## 2022-02-22 NOTE — Assessment & Plan Note (Addendum)
Pattern suggest rhabdo alcohol.  However, CK normal.  Does not drink.  LFT stable. ?-Stopped Crestor ?-Continue monitoring ?

## 2022-02-22 NOTE — Progress Notes (Signed)
Initial Nutrition Assessment ? ?DOCUMENTATION CODES:  ? ?Non-severe (moderate) malnutrition in context of chronic illness ? ?INTERVENTION:  ? ?-Ensure Plus High Protein po TID, each supplement provides 350 kcal and 20 grams of protein.  ? ? ?NUTRITION DIAGNOSIS:  ? ?Moderate Malnutrition related to chronic illness (dementia) as evidenced by moderate fat depletion, moderate muscle depletion. ? ?GOAL:  ? ?Patient will meet greater than or equal to 90% of their needs ? ?MONITOR:  ? ?PO intake, Supplement acceptance, Labs, Weight trends, I & O's ? ?REASON FOR ASSESSMENT:  ? ?Consult ?Assessment of nutrition requirement/status ? ?ASSESSMENT:  ? ?76 year old M with PMH of dementia, CAD/CABG, DM-2, HTN, OSA on CPAP, recent hospitalization for left hip fracture after mechanical fall for which he underwent closed reduction with percutaneous pin fixation on 5/5 and discharged to SNF on 5/10 returning with shortness of breath, fever, poor p.o. intake, solid dysphagia, lethargy and hypoxia to 80s, and admitted for severe sepsis with acute hypoxic respiratory failure due to RLL pneumonia. ? ?Patient familiar to RD from recent admission. Pt seen 5/10 and discharged the same day.  ?Pt at that time was struggling with appetite and PO intakes. Pt returns now with dysphagia, aspiration pneumonia. PO intakes have continued to be poor.  ?SLP evaluated today and pt to remain on full liquids.  ?Ensure supplements ordered, will increase to TID. ? ?Per weight records, pt has lost 14 lbs since 5/4 (8% wt loss x  11 days, significant for time frame).  ? ?Medications: Colace, FOLTX, Magic mouthwash w/ lidocaine, Miralax, KLOR-CON, K-Phos ? ?Labs reviewed: ?CBGs: 76-193 ?Low K, Phos ? ?NUTRITION - FOCUSED PHYSICAL EXAM: ? ?Flowsheet Row Most Recent Value  ?Orbital Region Moderate depletion  ?Upper Arm Region Moderate depletion  ?Thoracic and Lumbar Region Moderate depletion  ?Buccal Region Mild depletion  ?Temple Region Mild depletion   ?Clavicle Bone Region Moderate depletion  ?Clavicle and Acromion Bone Region Moderate depletion  ?Scapular Bone Region Moderate depletion  ?Dorsal Hand Severe depletion  ?Patellar Region Unable to assess  ?Anterior Thigh Region Unable to assess  ?Posterior Calf Region Unable to assess  ?Edema (RD Assessment) None  ?Hair Reviewed  ?Eyes Reviewed  ?Mouth Reviewed  ?Skin Reviewed  ?Nails Reviewed  ? ?  ? ? ?Diet Order:   ?Diet Order   ? ?       ?  Diet full liquid Room service appropriate? Yes; Fluid consistency: Thin  Diet effective now       ?  ? ?  ?  ? ?  ? ? ?EDUCATION NEEDS:  ? ?Not appropriate for education at this time ? ?Skin:  Skin Integrity Issues:: Incisions ?Incisions: 5/5 left hip ? ?Last BM:  5/14 -type 5 ? ?Height:  ? ?Ht Readings from Last 1 Encounters:  ?02/20/22 '5\' 7"'$  (1.702 m)  ? ? ?Weight:  ? ?Wt Readings from Last 1 Encounters:  ?02/20/22 69.2 kg  ? ? ?BMI:  Body mass index is 23.88 kg/m?. ? ?Estimated Nutritional Needs:  ? ?Kcal:  1800-2000 ? ?Protein:  85-100g ? ?Fluid:  2L/day ? ?Clayton Bibles, MS, RD, LDN ?Inpatient Clinical Dietitian ?Contact information available via Amion ? ?

## 2022-02-22 NOTE — Progress Notes (Signed)
?PROGRESS NOTE ? ?Derek [name] WCH:[id number] DOB: [birth year]  ? ?PCP: Lavone Orn, MD ? ?Patient is from: SNF.  [facility] ? ?DOA: 02/20/2022 LOS: 2 ? ?Chief complaints ?Chief Complaint  ?Patient presents with  ? Fever  ?  ? ?Brief Narrative / Interim history: ?76 year old M with PMH of dementia, CAD/CABG, DM-2, HTN, OSA on CPAP, recent hospitalization for left hip fracture after mechanical fall for which he underwent closed reduction with percutaneous pin fixation on 5/5 and discharged to SNF on 5/10 returning with shortness of breath, fever, poor p.o. intake, solid dysphagia, lethargy and hypoxia to 80s, and admitted for severe sepsis with acute hypoxic respiratory failure due to RLL pneumonia concerning for aspiration pneumonia given patient's dysphagia.  Initially started on IV cefepime and NRB.  IV Flagyl added the next day. ? ?Blood culture, urine culture, MRSA PCR, COVID-19 and influenza PCR negative.  Antibiotics de-escalated to IV Unasyn.  Therapy recommends dysphagia 2 diet.  Respiratory failure improved.  Currently saturating in mid 90s on 6 L by [place].  ? ?Subjective: ?Seen and examined earlier this morning and this afternoon.  No major events overnight of this morning.  No complaints but not a great historian.  He is awake and oriented to self, person and place but not time.  Follows commands appropriately.  He responds no to pain, shortness of breath, GI or UTI symptoms. ? ?Objective: ?Vitals:  ? 02/22/22 0416 02/22/22 0800 02/22/22 0921 02/22/22 0923  ?BP: (!) 149/78 128/73 123/68   ?Pulse: 88   91  ?Resp: 19 16 (!) 23   ?Temp: 98 ?F (36.7 ?C) 97.6 ?F (36.4 ?C)    ?TempSrc: Oral Oral    ?SpO2: 90% 93% 96%   ?Weight:      ?Height:      ? ? ?Examination ? ?GENERAL: No apparent distress.  Nontoxic. ?HEENT: MMM.  Vision and hearing grossly intact.  ?NECK: Supple.  No apparent JVD.  ?RESP: 94% on 4 L.  No IWOB.  Fair aeration bilaterally. ?CVS:  RRR. Heart sounds normal.  ?ABD/GI/GU: BS+. Abd soft, NTND.   ?MSK/EXT:  Moves extremities. No apparent deformity. No edema.  ?SKIN: no apparent skin lesion or wound ?NEURO: Awake and alert. Oriented to self, person and place.  Follows commands.  No apparent focal neuro deficit. ?PSYCH: Calm. Normal affect.  ? ?Procedures:  ?None ? ?Microbiology summarized: ?COVID-19, influenza and MRSA PCR nonreactive. ?Blood cultures NGTD. ?Urine culture NGTD. ? ?Assessment and Plan: ?* Severe sepsis due to aspiration pneumonia ?Febrile, tachycardic, tachypneic, hypoxic with leukocytosis, lactic acidosis and mental status change on presentation.  Imaging suggest RLL infiltrate concerning for aspiration pneumonia given patient's dementia and dysphagia.  Sepsis physiology and respiratory failure improved.  Blood and urine cultures NGTD.  MRSA, COVID-19 and influenza PCR reactive.  Procalcitonin 7.9>> 7.2..  Patient is immunocompromised on methotrexate for his rheumatoid arthritis. ?-Cefepime and Flagyl >> IV Unasyn ?-Aspiration precaution ?-Dysphagia 2 diet per SLP ?-Aspiration precaution ? ?Acute respiratory failure with hypoxia ([place]) ?Likely due to aspiration pneumonia and sepsis.  Desaturated to 87% on RA.  He was a started on 15 L by NRB and recovered to mid 90s.  Currently saturating in 90s on 6 L. ?-Monitor aspiration pneumonia as above ?-Wean oxygen as able ?-CPAP at night ? ?Goals of care, counseling/discussion ?Extensive goals of care discussion yesterday.  Patient's wife met with palliative care today.  Remains full code. ? ?Elevated liver enzymes ?Pattern suggest rhabdo alcohol.  However, CK normal.  Does not  drink. ?-Stop Crestor ?-Monitor ? ?Decreased oral intake ?Likely due to acute illness, dysphagia and dementia.   ?Nutrition Problem: Moderate Malnutrition ?Etiology: chronic illness (dementia) ?Signs/Symptoms: moderate fat depletion, moderate muscle depletion ?Interventions: Ensure Enlive (each supplement provides 350kcal and 20 grams of protein), MVI ? ?Dysphagia and  odynophagia ?Some pain and struggle with solid lately.  No apparent oral region on exam but difficult to assess pharynx ?-Aspiration precaution and dysphagia 3 diet ?-Trial of Magic mouthwash with lidocaine ? ?Dementia without behavioral disturbance ([place]) ?Per patient's wife, diagnosed with dementia about 3 years ago and his cognition has been declining lately.  He is awake but only oriented to self and his wife. ?-Reorientation and delirium precaution ?-Avoid or minimize sedating medications. ? ?Closed left hip fracture ([place]) ?Had closed reduction with percutaneous pin fixation on 5/5 and discharged to SNF on 5/10.  Surgical dressing in place. ?-WBAT, posterior hip precaution and PT/OT ?-Use Lovenox for VTE prophylaxis while in-house.  Full dose aspirin on discharge ?-Outpatient follow-up with orthopedic surgeon as previously planned ? ?Hyperbilirubinemia ?Likely from sepsis.  Continue monitoring. ? ?Hypokalemia and hypophosphatemia ?K3.3.  P1.6.  At risk for refeeding syndrome ?-IV potassium phosphate 30 mmol x 1 ?-P.o. KCl packet 60x1. ?-Continue monitoring ? ?Rheumatoid arthritis ([place]) ?Hold home methotrexate. ? ?Benign essential hypertension ?Normotensive. ?-Continue home metoprolol. ?-Continue holding Lasix ? ?CAD s/p CABG in 2019 at [facility] ?Denies chest pain.  Seems to be stable. ?-Continue home medication ? ?Controlled NIDDM-2 with hyperlipidemia ?A1c 5.1% about a week ago.  On Jardiance, metformin and Crestor at home. ?Recent Labs  ?Lab 02/21/22 ?1155 02/21/22 ?1559 02/21/22 ?2041 02/22/22 ?0749 02/22/22 ?1149  ?GLUCAP 76 94 103* 91 121*  ?-Continue SSI-sensitive ?-Hold home Jardiance and metformin ?-Hold Crestor in the setting of elevated LFT ? ? ?OSA on CPAP ?Continue nightly CPAP ? ? ? ?DVT prophylaxis:  ?enoxaparin (LOVENOX) injection 40 mg Start: 02/21/22 1400 ?SCDs Start: 02/20/22 1530 ? ?Code Status: Full code ?Family Communication: Updated patient's wife at bedside. ?Level of care: Progressive ?Status  is: Inpatient ?Remains inpatient appropriate because: Severe sepsis and acute respiratory failure with hypoxia due to aspiration pneumonia ? ? ?Final disposition: Likely SNF once medically stable. ?Consultants:  ?Palliative medicine ? ?Sch Meds:  ?Scheduled Meds: ? docusate sodium  100 mg Oral BID  ? enoxaparin (LOVENOX) injection  40 mg Subcutaneous Q24H  ? feeding supplement  237 mL Oral TID BM  ? FLUoxetine  20 mg Oral q morning  ? folic acid-pyridoxine-cyancobalamin  1 tablet Oral Daily  ? guaiFENesin  600 mg Oral BID  ? insulin aspart  0-9 Units Subcutaneous TID WC  ? magic mouthwash w/lidocaine  5 mL Oral TID  ? metoprolol tartrate  12.5 mg Oral BID  ? polyethylene glycol  17 g Oral Daily  ? ?Continuous Infusions: ? ampicillin-sulbactam (UNASYN) IV 3 g (02/22/22 1407)  ? ?PRN Meds:.acetaminophen **OR** acetaminophen, chlorproMAZINE (THORAZINE) injection, HYDROcodone-acetaminophen, senna-docusate, sodium phosphate ? ?Antimicrobials: ?Anti-infectives (From admission, onward)  ? ? Start     Dose/Rate Route Frequency Ordered Stop  ? 02/22/22 0800  Ampicillin-Sulbactam (UNASYN) 3 g in sodium chloride 0.9 % 100 mL IVPB       ? 3 g ?200 mL/hr over 30 Minutes Intravenous Every 6 hours 02/22/22 0707 02/27/22 0759  ? 02/21/22 1800  ceFEPIme (MAXIPIME) 2 g in sodium chloride 0.9 % 100 mL IVPB  Status:  Discontinued       ? 2 g ?200 mL/hr over 30 Minutes Intravenous Every 8  hours 02/21/22 1210 02/22/22 0707  ? 02/21/22 1000  ceFEPIme (MAXIPIME) 2 g in sodium chloride 0.9 % 100 mL IVPB  Status:  Discontinued       ? 2 g ?200 mL/hr over 30 Minutes Intravenous Every 12 hours 02/20/22 1329 02/20/22 1746  ? 02/21/22 1000  metroNIDAZOLE (FLAGYL) IVPB 500 mg  Status:  Discontinued       ? 500 mg ?100 mL/hr over 60 Minutes Intravenous Every 12 hours 02/21/22 0751 02/22/22 0707  ? 02/20/22 1900  ceFEPIme (MAXIPIME) 2 g in sodium chloride 0.9 % 100 mL IVPB  Status:  Discontinued       ? 2 g ?200 mL/hr over 30 Minutes Intravenous  Every 12 hours 02/20/22 1746 02/21/22 1210  ? 02/20/22 1345  vancomycin (VANCOREADY) IVPB 1500 mg/300 mL       ? 1,500 mg ?150 mL/hr over 120 Minutes Intravenous STAT 02/20/22 1336 02/20/22 1644  ? 02/20/22 10

## 2022-02-22 NOTE — Consult Note (Signed)
Consultation Note Date: 02/22/2022   Patient Name: Derek [name]  DOB: 1946-08-30  MRN: [id number]  Age / Sex: 76 y.o., male  PCP: Derek Orn, MD Referring Physician: Mercy Riding, MD  Reason for Consultation: Establishing goals of care  HPI/Patient Profile: 76 y.o. male  with past medical history of dementia, CAD/CABG, DM2, HTN, OSA (CPAP) and left hip fracture with pin 02/12/22 (d/c 02/16/22 to SNF) admitted on 02/20/2022 with RLL PNA. Pt was having SOB, fever, and poor PO intake at home. He is receiving IV antibiotics and attempting to ween oxygen from 6-9L [place] (no O2 use at home).     PMT was consulted to discuss code status and review GOC.  Clinical Assessment and Goals of Care: I have reviewed medical records including EPIC notes, labs and imaging, assessed the patient and then met with patient and hi wife Derek [name]  to discuss diagnosis prognosis, GOC, EOL wishes, disposition and options.  I introduced Palliative Medicine as specialized medical care for people living with serious illness. It focuses on providing relief from the symptoms and stress of a serious illness. The goal is to improve quality of life for both the patient and the family.  We discussed a brief life review of the patient. They h ave been married for 46 years. 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 They have 2 children, a dtr and son.   As far as functional and nutritional status wife endorses patient has been sleeping most hours of the day and night for the past 2 years. He is lethargic and spends most of his time in the chair or on the couch. He is forgetful at times. He usually has a healthy appetite but she has noticed a significant decline in his appetite/po intake over the last month.   We discussed patient's current illness and what it means in the larger context of patient's on-going co-morbidities.  Natural disease  trajectory and expectations at EOL were discussed.  I attempted to elicit values and goals of care important to the patient. Advance directives, concepts specific to code status, artificial feeding and hydration, and rehospitalization were considered and discussed. Wife shares she would not want him to live long term on life support. However, she would like all attempts to "save his life". We discussed outcomes and prognosis if patient was to need ventilatory support. She shared she is not ready to make that decision for him but she would not want him to live on a machine if he would not interact with her and her family. FULL code remains since wife did not want to make any changes today, but she is considering allowing a natural death.       Discussed with patient/family the importance of continued conversation with family and the medical providers regarding overall plan of care and treatment options, ensuring decisions are within the context of the patient's values and GOCs.    Questions and concerns were addressed. The family was encouraged to call with questions or  concerns. PMT will continue to monitor and follow the patient throughout his hospitalization.   Primary Decision Maker NEXT OF KIN  Code Status/Advance Care Planning: Full code  Prognosis:   Unable to determine  Discharge Planning: To Be Determined  Primary Diagnoses: Present on Admission:  Aspiration pneumonia ([place])  Benign essential hypertension  CAD s/p CABG in 2019 at [facility]  Rheumatoid arthritis ([place])  Dementia without behavioral disturbance (HCC)  Malnutrition of moderate degree  (Resolved) Mixed hyperlipidemia   Physical Exam Vitals reviewed.  Constitutional:      General: He is not in acute distress.    Appearance: He is not toxic-appearing.  HENT:     Head: Normocephalic.     Mouth/Throat:     Mouth: Mucous membranes are moist.  Eyes:     Pupils: Pupils are equal, round, and reactive to light.   Cardiovascular:     Pulses: Normal pulses.  Pulmonary:     Breath sounds: Rhonchi present.     Comments: 7L Foley in place, strong productive cough Abdominal:     Palpations: Abdomen is soft.  Musculoskeletal:     Comments: Generalized weakness  Neurological:     Mental Status: Mental status is at baseline.     Comments: Oriented to self  Psychiatric:        Behavior: Behavior normal.        Thought Content: Thought content normal.        Judgment: Judgment normal.    Palliative Assessment/Data: 50%     I discussed this patient's plan of care with patient, patient's wife Derek [name], OT, RN.  Thank you for this consult. Palliative medicine will continue to follow and assist holistically.   Time Total: 75 minutes Greater than 50%  of this time was spent counseling and coordinating care related to the above assessment and plan.  Signed by: Derek Hawks, DNP, FNP-BC Palliative Medicine    Please contact Palliative Medicine Team phone at [phone] for questions and concerns.  For individual provider: See Derek [name]

## 2022-02-22 NOTE — Progress Notes (Signed)
Speech Language Pathology Treatment: Dysphagia  ?Patient Details ?Name: Derek [name] ?MRN: [id number] ?DOB: Dec 29, 1945 ?Today's Date: 02/22/2022 ?Time: 4193-7902 ?SLP Time Calculation (min) (ACUTE ONLY): 20 min ? ?Assessment / Plan / Recommendation ?Clinical Impression ? Patient seen by SLP for skilled treatment session focused on dysphagia goals. His wife was present in the room as well and she provided history. Patient was alert but appeared fatigued/drowsy. He was willing to try some bites of vanilla ice cream. He would grimace slightly when PO's in mouth and during swallow but no overt s/s aspiration or penetration. SLP suspecting possible delayed swallow initiation. Patient was observed to have frequent silent hiccuping which occured prior to, during and after very small (3 spoonfuls) amount of ice cream. His wife reported that she noticed the hiccuping when he was admitted on 5/13 but denied him having any baseline hiccups or other GI related symptoms. SLP is recommending to advance slightly from clear liquids to full liquids and will continue to monitor for toleration and trial advanced solids as patient tolerates. MD notified of patient's frequent hiccuping. ?  ?HPI HPI: Pt is a 76 y.o. male who presented with dyspnea and fever. CXR 5/14: New airspace disease in the medial right base compatible with atelectasis or pneumonia. Per RN note 5/14, pt given coffee and "he began coughing and spit it up along with small amount of thick mucus." PMH: dementia, CAD s/p CABG, DM2, HTN, OSA on CPAP, mild cognitive impairment. ?  ?   ?SLP Plan ? Continue with current plan of care ? ?  ?  ?Recommendations for follow up therapy are one component of a multi-disciplinary discharge planning process, led by the attending physician.  Recommendations may be updated based on patient status, additional functional criteria and insurance authorization. ?  ? ?Recommendations  ?Diet recommendations: Thin liquid;Other(comment) (full  liquids) ?Medication Administration: Whole meds with puree ?Supervision: Full supervision/cueing for compensatory strategies ?Compensations: Slow rate;Small sips/bites ?Postural Changes and/or Swallow Maneuvers: Seated upright 90 degrees;Upright 30-60 min after meal  ?   ?    ?   ? ? ? ? Oral Care Recommendations: Oral care BID;Staff/trained caregiver to provide oral care ?Assistance recommended at discharge: Frequent or constant Supervision/Assistance ?SLP Visit Diagnosis: Dysphagia, unspecified (R13.10) ?Plan: Continue with current plan of care ? ? ? ? ?  ?  ? ?Sonia Baller, MA, CCC-SLP ?Speech Therapy ? ?

## 2022-02-22 NOTE — TOC Initial Note (Signed)
Transition of Care (TOC) - Initial/Assessment Note  ? ? ?Patient Details  ?Name: Derek [name] ?MRN: [id number] ?Date of Birth: Oct 24, 1945 ? ?Transition of Care [facility]) CM/SW Contact:    ?Leeroy Cha, RN ?Phone Number: ?02/22/2022, 8:52 AM ? ?Clinical Narrative:                 ?Patient is from [facility] for fractured hip now with pna, will follow for return to [place] ? ?Expected Discharge Plan: [place] (from [facility]) ?Barriers to Discharge: No Barriers Identified ? ? ?Patient Goals and CMS Choice ?Patient states their goals for this hospitalization and ongoing recovery are:: unable to state ?CMS Medicare.gov Compare Post Acute Care list provided to:: Patient Represenative (must comment) (wife) ?Choice offered to / list presented to : Spouse ? ?Expected Discharge Plan and Services ?Expected Discharge Plan: [place] (from [facility]) ?  ?Discharge Planning Services: CM Consult ?Post Acute Care Choice: [place] ?Living arrangements for the past 2 months: [place] ?                ?  ?  ?  ?  ?  ?  ?  ?  ?  ?  ? ?Prior Living Arrangements/Services ?Living arrangements for the past 2 months: [place] ?Lives with:: Spouse ?Patient language and need for interpreter reviewed:: Yes ?Do you feel safe going back to the place where you live?: Yes      ?  ?  ?  ?Criminal Activity/Legal Involvement Pertinent to Current Situation/Hospitalization: No - Comment as needed ? ?Activities of Daily Living ?Home Assistive Devices/Equipment: Gilford Rile (specify type) ?ADL Screening (condition at time of admission) ?Patient's cognitive ability adequate to safely complete daily activities?: No ?Is the patient deaf or have difficulty hearing?: No ?Does the patient have difficulty seeing, even when wearing glasses/contacts?: No ?Does the patient have difficulty concentrating, remembering, or making decisions?: Yes ?Patient able to express need for assistance  with ADLs?: Yes ?Does the patient have difficulty dressing or bathing?: Yes ?Independently performs ADLs?: No ?Communication: Independent ?Dressing (OT): Needs assistance ?Is this a change from baseline?: Pre-admission baseline ?Grooming: Needs assistance ?Is this a change from baseline?: Pre-admission baseline ?Feeding: Needs assistance ?Is this a change from baseline?: Change from baseline, expected to last <3 days ?Bathing: Needs assistance ?Is this a change from baseline?: Pre-admission baseline ?Toileting: Needs assistance ?Is this a change from baseline?: Pre-admission baseline ?In/Out Bed: Needs assistance ?Is this a change from baseline?: Pre-admission baseline ?Walks in Home: Needs assistance ?Is this a change from baseline?: Pre-admission baseline ?Does the patient have difficulty walking or climbing stairs?: Yes ?Weakness of Legs: Left ?Weakness of Arms/Hands: None ? ?Permission Sought/Granted ?  ?  ?   ?   ?   ?   ? ?Emotional Assessment ?Appearance:: Appears stated age ?Attitude/Demeanor/Rapport: Inconsistent ?Affect (typically observed): Calm ?Orientation: : Oriented to Self ?Alcohol / Substance Use: Not Applicable ?Psych Involvement: No (comment) ? ?Admission diagnosis:  PNA (pneumonia) [J18.9] ?Community acquired pneumonia of right lower lobe of lung [J18.9] ?Sepsis with acute hypoxic respiratory failure without septic shock, due to unspecified organism ([place]) [A41.9, R65.20, J96.01] ?Patient Active Problem List  ? Diagnosis Date Noted  ? Dysphagia and odynophagia 02/21/2022  ? Decreased oral intake 02/21/2022  ? Goals of care, counseling/discussion 02/21/2022  ? Aspiration pneumonia ([place]) 02/20/2022  ? Hypokalemia 02/20/2022  ? Hyperbilirubinemia 02/20/2022  ? Severe sepsis due to aspiration pneumonia 02/20/2022  ? Acute respiratory  failure with hypoxia ([place]) 02/20/2022  ? Malnutrition of moderate degree 02/17/2022  ? Closed left hip fracture ([place]) 02/11/2022  ? Depression 02/11/2022  ? History of  TIA (transient ischemic attack) 02/11/2022  ? History of coronary artery bypass graft 02/11/2022  ? Controlled NIDDM-2 with hyperlipidemia 02/11/2022  ? Gout 02/11/2022  ? Primary open-angle glaucoma, bilateral, indeterminate stage 02/11/2022  ? CAD s/p CABG in 2019 at [facility] 02/11/2022  ? Dementia without behavioral disturbance ([place]) 02/11/2022  ? Post-traumatic stress disorder, chronic 02/11/2022  ? OSA on CPAP 05/01/2021  ? Benign essential hypertension 05/01/2021  ? Rheumatoid arthritis ([place]) 05/01/2021  ? DJD (degenerative joint disease) of hip 06/13/2012  ?  Class: Chronic  ? ?PCP:  Lavone Orn, MD ?Pharmacy:   ?[id number]?Phone: [phone]: [phone]? ?[phone]?Phone: [phone]: [phone]? ? ? ? ?Social Determinants of Health (SDOH) Interventions ?  ? ?Readmission Risk Interventions ?   ? View : No data to display.  ?  ?  ?  ? ? ? ?

## 2022-02-22 NOTE — Evaluation (Signed)
Occupational Therapy Evaluation ?Patient Details ?Name: Derek [name] ?MRN: [id number] ?DOB: 1945-11-08 ?Today's Date: 02/22/2022 ? ? ?History of Present Illness Samyak Sackmann Lenhoff is a 76 y.o. male with medical history significant of CAD s/p CABG, DM2, HTN, OSA on CPAP, mild cognitive impairment. Presenting with dyspnea and fever. Recently admitted for hip fracture, s/p ORIF. Was sent to SNF for rehab on 5/10. While there he apparently became dyspneic and febrile. admitted with RLL pna  ? ?Clinical Impression ?  ?Patient is a 76 year old male who was admitted for above. Patient was noted to have increased fatigue and lethargy during session. Patient was able to participate in sitting on edge of bed for about 1 min with increased fatigue. Patient was noted to have decreased functional activity tolerance, decreased endurance, decreased sitting balance, decreased safety awareness and decreased strength impacting participation in ADLs. Patient was also noted to have difficulty with coughing up/spitting out sputum when sitting upright in bed. Nurse consulted about younker use. Nurse was in room placing one in room at end of session. Patient would continue to benefit from skilled OT services at this time while admitted and after d/c to address noted deficits in order to improve overall safety and independence in ADLs.  ? ?   ? ?Recommendations for follow up therapy are one component of a multi-disciplinary discharge planning process, led by the attending physician.  Recommendations may be updated based on patient status, additional functional criteria and insurance authorization.  ? ?Follow Up Recommendations ? Skilled nursing-short term rehab (<3 hours/day)  ?  ?Assistance Recommended at Discharge Frequent or constant Supervision/Assistance  ?Patient can return home with the following A lot of help with walking and/or transfers;A lot of help with bathing/dressing/bathroom;Assistance with cooking/housework;Direct  supervision/assist for medications management;Direct supervision/assist for financial management;Assist for transportation;Help with stairs or ramp for entrance ? ?  ?Functional Status Assessment ? Patient has had a recent decline in their functional status and demonstrates the ability to make significant improvements in function in a reasonable and predictable amount of time.  ?Equipment Recommendations ? None recommended by OT  ?  ?Recommendations for Other Services   ? ? ?  ?Precautions / Restrictions Precautions ?Precautions: Fall;Other (comment) ?Precaution Comments: pt on 7L HFNC, monitor VS ?Restrictions ?Weight Bearing Restrictions: No ?LLE Weight Bearing: Weight bearing as tolerated ?Other Position/Activity Restrictions: per previous notes recent admission for L hip fx  ? ?  ? ?Mobility Bed Mobility ?Overal bed mobility: Needs Assistance ?Bed Mobility: Supine to Sit, Sit to Supine ?  ?  ?Supine to sit: Max assist, HOB elevated ?Sit to supine: Max assist ?  ?General bed mobility comments: see ADL note ?  ? ?Transfers ?  ?  ?  ?  ?  ?  ?  ?  ?  ?  ?  ? ?  ?Balance Overall balance assessment: Needs assistance ?Sitting-balance support: Feet supported, Single extremity supported, Bilateral upper extremity supported ?Sitting balance-Leahy Scale: Poor ?  ?  ?  ?  ?  ?  ?  ?  ?  ?  ?  ?  ?  ?  ?  ?  ?   ? ?ADL either performed or assessed with clinical judgement  ? ?ADL Overall ADL's : Needs assistance/impaired ?  ?  ?Grooming: Moderate assistance;Bed level ?  ?Upper Body Bathing: Maximal assistance;Bed level ?  ?Lower Body Bathing: Maximal assistance;Bed level ?  ?Upper Body Dressing : Maximal assistance;Bed level ?  ?Lower Body Dressing: Maximal assistance;Bed  level ?  ?Toilet Transfer: +2 for physical assistance;+2 for safety/equipment ?Toilet Transfer Details (indicate cue type and reason): patient was max A for supine to sit on edge of bed with increased time and cues for proper hand and foot placement for  patient to be able to assist with movements. patient was noted to be on 9L HFNC upone entrance to room with 98-99% O2 readings. patient was bumped down to 7L/min HFNC during session with patient having one episode of dropping down to 88% while sitting on edge of bed for about 1 min with education for deep breathing. patient was able to bring back up to 90% on 7L/min similar episode happened after returning to supine with rolling to change linens. ?Toileting- Clothing Manipulation and Hygiene: +2 for physical assistance;+2 for safety/equipment ?  ?  ?  ?  ?General ADL Comments: patient was noted to have clogged line for primofit line with bed linens wet at this time. patient was mod A to roll to each side with increased cues for proper hand placement for patient to participate in rolling. patient was noted to have increased diffiuclty with coughing up and spitting out phlem during session with mod a to make sure it was getting out of mouth. nurse made aware and consulted about younker. nurse was in room setting up younker at end of session.  ? ? ? ?Vision Patient Visual Report: No change from baseline ?   ?   ?Perception   ?  ?Praxis   ?  ? ?Pertinent Vitals/Pain Pain Assessment ?Pain Assessment: No/denies pain  ? ? ? ?Hand Dominance Right ?  ?Extremity/Trunk Assessment Upper Extremity Assessment ?Upper Extremity Assessment: Generalized weakness;Difficult to assess due to impaired cognition ?  ?Lower Extremity Assessment ?Lower Extremity Assessment: Defer to PT evaluation ?  ?Cervical / Trunk Assessment ?Cervical / Trunk Assessment: Kyphotic ?  ?Communication Communication ?Communication: No difficulties ?  ?Cognition Arousal/Alertness: Lethargic ?Behavior During Therapy: Flat affect ?Overall Cognitive Status: History of cognitive impairments - at baseline ?  ?  ?  ?  ?  ?  ?  ?  ?  ?  ?  ?  ?  ?  ?  ?  ?General Comments: patient is able to verbalize some but noted to be very sleepy. able to arouse for minimal tasks  and communication ?  ?  ?General Comments    ? ?  ?Exercises   ?  ?Shoulder Instructions    ? ? ?Home Living Family/patient expects to be discharged to:: Skilled nursing facility ?Living Arrangements: Other (Comment) (SNF) ?Available Help at Discharge: Family ?Type of Home: House ?Home Access: Ramped entrance ?  ?  ?Home Layout: Two level ?Alternate Level Stairs-Number of Steps: 13 with stair lift ?  ?Bathroom Shower/Tub: Walk-in shower ?  ?Bathroom Toilet: Handicapped height ?  ?  ?Home Equipment: Rollator (4 wheels);Shower seat;Cane - single point ?  ?Additional Comments: information taken from chart with wife speaking with NP at this time and patient noted to be lethargic/ quickly fatigued during session. ?  ? ?  ?Prior Functioning/Environment Prior Level of Function : Independent/Modified Independent ?  ?  ?  ?  ?  ?  ?Mobility Comments: Amb with cane or rollator depending on the day after hip fx ?ADLs Comments: not driving, reports independence with ADLs ?  ? ?  ?  ?OT Problem List: Decreased strength;Decreased range of motion;Decreased activity tolerance;Impaired balance (sitting and/or standing);Decreased cognition;Decreased knowledge of use of DME or AE;Pain ?  ?   ?  OT Treatment/Interventions: Self-care/ADL training;Therapeutic exercise;DME and/or AE instruction;Cognitive remediation/compensation;Therapeutic activities;Balance training;Patient/family education  ?  ?OT Goals(Current goals can be found in the care plan section) Acute Rehab OT Goals ?Patient Stated Goal: none stated ?OT Goal Formulation: With patient/family ?Time For Goal Achievement: 03/08/22 ?Potential to Achieve Goals: Fair  ?OT Frequency: Min 2X/week ?  ? ?Co-evaluation   ?  ?  ?  ?  ? ?  ?AM-PAC OT "6 Clicks" Daily Activity     ?Outcome Measure Help from another person eating meals?: A Lot ?Help from another person taking care of personal grooming?: A Lot ?Help from another person toileting, which includes using toliet, bedpan, or urinal?:  Total ?Help from another person bathing (including washing, rinsing, drying)?: Total ?Help from another person to put on and taking off regular upper body clothing?: A Lot ?Help from another person to put

## 2022-02-23 DIAGNOSIS — J9601 Acute respiratory failure with hypoxia: Secondary | ICD-10-CM | POA: Diagnosis not present

## 2022-02-23 DIAGNOSIS — S72002D Fracture of unspecified part of neck of left femur, subsequent encounter for closed fracture with routine healing: Secondary | ICD-10-CM | POA: Diagnosis not present

## 2022-02-23 DIAGNOSIS — Z7189 Other specified counseling: Secondary | ICD-10-CM | POA: Diagnosis not present

## 2022-02-23 DIAGNOSIS — A419 Sepsis, unspecified organism: Secondary | ICD-10-CM | POA: Diagnosis not present

## 2022-02-23 DIAGNOSIS — J690 Pneumonitis due to inhalation of food and vomit: Secondary | ICD-10-CM | POA: Diagnosis not present

## 2022-02-23 LAB — MAGNESIUM: Magnesium: 2.1 mg/dL (ref 1.7–2.4)

## 2022-02-23 LAB — LEGIONELLA PNEUMOPHILA SEROGP 1 UR AG: L. pneumophila Serogp 1 Ur Ag: NEGATIVE

## 2022-02-23 LAB — COMPREHENSIVE METABOLIC PANEL
ALT: 66 U/L — ABNORMAL HIGH (ref 0–44)
AST: 130 U/L — ABNORMAL HIGH (ref 15–41)
Albumin: 2.3 g/dL — ABNORMAL LOW (ref 3.5–5.0)
Alkaline Phosphatase: 101 U/L (ref 38–126)
Anion gap: 7 (ref 5–15)
BUN: 18 mg/dL (ref 8–23)
CO2: 25 mmol/L (ref 22–32)
Calcium: 8.8 mg/dL — ABNORMAL LOW (ref 8.9–10.3)
Chloride: 111 mmol/L (ref 98–111)
Creatinine, Ser: 0.84 mg/dL (ref 0.61–1.24)
GFR, Estimated: >60 mL/min (ref >60)
Glucose, Bld: 107 mg/dL — ABNORMAL HIGH (ref 70–99)
Potassium: 3.2 mmol/L — ABNORMAL LOW (ref 3.5–5.1)
Sodium: 143 mmol/L (ref 135–145)
Total Bilirubin: 1 mg/dL (ref 0.3–1.2)
Total Protein: 6 g/dL — ABNORMAL LOW (ref 6.5–8.1)

## 2022-02-23 LAB — CBC
HCT: 35.8 % — ABNORMAL LOW (ref 39.0–52.0)
Hemoglobin: 12 g/dL — ABNORMAL LOW (ref 13.0–17.0)
MCH: 33.6 pg (ref 26.0–34.0)
MCHC: 33.5 g/dL (ref 30.0–36.0)
MCV: 100.3 fL — ABNORMAL HIGH (ref 80.0–100.0)
Platelets: 325 10*3/uL (ref 150–400)
RBC: 3.57 MIL/uL — ABNORMAL LOW (ref 4.22–5.81)
RDW: 14.1 % (ref 11.5–15.5)
WBC: 16.6 10*3/uL — ABNORMAL HIGH (ref 4.0–10.5)
nRBC: 0 % (ref 0.0–0.2)

## 2022-02-23 LAB — GLUCOSE, CAPILLARY
Glucose-Capillary: 113 mg/dL — ABNORMAL HIGH (ref 70–99)
Glucose-Capillary: 149 mg/dL — ABNORMAL HIGH (ref 70–99)
Glucose-Capillary: 149 mg/dL — ABNORMAL HIGH (ref 70–99)
Glucose-Capillary: 122 mg/dL — ABNORMAL HIGH (ref 70–99)

## 2022-02-23 LAB — PROCALCITONIN: Procalcitonin: 4.14 ng/mL

## 2022-02-23 MED ORDER — POTASSIUM CHLORIDE 2 MEQ/ML IV SOLN
INTRAVENOUS | Status: DC
  Filled 2022-02-23 (×13): qty 1000

## 2022-02-23 MED ORDER — CHLORPROMAZINE HCL 25 MG/ML IJ SOLN
25.0000 mg | Freq: Four times a day (QID) | INTRAMUSCULAR | Status: DC | PRN
  Administered 2022-02-23 – 2022-02-25 (×2): 25 mg via INTRAMUSCULAR
  Filled 2022-02-23 (×4): qty 1

## 2022-02-23 MED ORDER — POTASSIUM CHLORIDE 20 MEQ PO PACK
40.0000 meq | PACK | ORAL | Status: AC
  Administered 2022-02-23 (×2): 40 meq via ORAL
  Filled 2022-02-23 (×2): qty 2

## 2022-02-23 MED ORDER — HYDROCODONE-ACETAMINOPHEN 5-325 MG PO TABS: 1.0000 | ORAL_TABLET | Freq: Four times a day (QID) | ORAL | Status: DC | PRN

## 2022-02-23 NOTE — TOC Progression Note (Signed)
Transition of Care (TOC) - Progression Note  ? ? ?Patient Details  ?Name: Derek [name] ?MRN: [id number] ?Date of Birth: 12-04-1945 ? ?Transition of Care (TOC) CM/SW Contact  ?Leeroy Cha, RN ?Phone Number: ?02/23/2022, 12:36 PM ? ?Clinical Narrative:    ?Fl2 sent to [facility]. ?Passar number [id number] ?Ssn [id number] ?Expected Discharge Plan: [facility] (from [facility]) ?Barriers to Discharge: No Barriers Identified ? ?Expected Discharge Plan and Services ?Expected Discharge Plan: [place] (from [facility]) ?  ?Discharge Planning Services: CM Consult ?Post Acute Care Choice: [place] ?Living arrangements for the past 2 months: [place] ?                ?  ?  ?  ?  ?  ?  ?  ?  ?  ?  ? ? ?Social Determinants of Health (SDOH) Interventions ?  ? ?Readmission Risk Interventions ?   ? View : No data to display.  ?  ?  ?  ? ? ?

## 2022-02-23 NOTE — NC FL2 (Signed)
[place] LEVEL OF CARE SCREENING TOOL     IDENTIFICATION  Patient Name: Derek [name] Birthdate: 1946/01/20 Sex: male Admission Date (Current Location): 02/20/2022  [facility] and [place] Number:  Herbalist and Address:  [facility],  [place], [place]      Provider Number: [id number]  Attending Physician Name and Address:  Mercy Riding, MD  Relative Name and Phone Number:       Current Level of Care: Hospital Recommended Level of Care: [place] Prior Approval Number:    Date Approved/Denied:   PASRR Number: [id number]  Discharge Plan: SNF    Current Diagnoses: Patient Active Problem List   Diagnosis Date Noted   Hypokalemia 02/22/2022   Hypophosphatemia 02/22/2022   Elevated liver enzymes 02/22/2022   Dysphagia and odynophagia 02/21/2022   Decreased oral intake 02/21/2022   Goals of care, counseling/discussion 02/21/2022   Aspiration pneumonia ([place]) 02/20/2022   Hypokalemia and hypophosphatemia 02/20/2022   Hyperbilirubinemia 02/20/2022   Severe sepsis due to aspiration pneumonia 02/20/2022   Acute respiratory failure with hypoxia ([place]) 02/20/2022   Malnutrition of moderate degree 02/17/2022   Closed left hip fracture ([place]) 02/11/2022   Depression 02/11/2022   History of TIA (transient ischemic attack) 02/11/2022   History of coronary artery bypass graft 02/11/2022   Controlled NIDDM-2 with hyperlipidemia 02/11/2022   Gout 02/11/2022   Primary open-angle glaucoma, bilateral, indeterminate stage 02/11/2022   CAD s/p CABG in [facility] 02/11/2022   Dementia without behavioral disturbance ([place]) 02/11/2022   Post-traumatic stress disorder, chronic 02/11/2022   OSA on CPAP 05/01/2021   Benign essential hypertension 05/01/2021   Rheumatoid arthritis ([place]) 05/01/2021   DJD (degenerative joint disease) of hip 06/13/2012    Orientation RESPIRATION BLADDER Height & Weight     Self,  Time, Situation  Normal Incontinent Weight: 69.2 kg Height:  '5\' 7"'$  (170.2 cm)  BEHAVIORAL SYMPTOMS/MOOD NEUROLOGICAL BOWEL NUTRITION STATUS      Incontinent Diet (regular)  AMBULATORY STATUS COMMUNICATION OF NEEDS Skin   Extensive Assist Verbally Surgical wounds                       Personal Care Assistance Level of Assistance  Bathing, Feeding, Dressing Bathing Assistance: Limited assistance Feeding assistance: Limited assistance Dressing Assistance: Limited assistance     Functional Limitations Info  Hearing, Sight, Speech Sight Info: Adequate Hearing Info: Adequate Speech Info: Adequate    SPECIAL CARE FACTORS FREQUENCY  PT (By licensed PT), OT (By licensed OT)     PT Frequency: 5 x weekly OT Frequency: 5 x weekly            Contractures Contractures Info: Not present    Additional Factors Info  Code Status Code Status Info: full Allergies Info: nka Psychotropic Info: prozac Insulin Sliding Scale Info: insulin aspart novolog 0-9 units 3x day with meals       Current Medications (02/23/2022):  This is the current hospital active medication list Current Facility-Administered Medications  Medication Dose Route Frequency Provider Last Rate Last Admin   acetaminophen (TYLENOL) tablet 650 mg  650 mg Oral Q6H PRN Marylyn Ishihara, Tyrone A, DO   650 mg at 02/20/22 2133   Or   acetaminophen (TYLENOL) suppository 650 mg  650 mg Rectal Q6H PRN Marylyn Ishihara, Tyrone A, DO       Ampicillin-Sulbactam (UNASYN) 3 g in sodium chloride 0.9 % 100 mL IVPB  3 g Intravenous Q6H  Wendee Beavers T, MD 200 mL/hr at 02/23/22 0825 3 g at 02/23/22 0825   chlorproMAZINE (THORAZINE) injection 25 mg  25 mg Intramuscular QID PRN Wendee Beavers T, MD   25 mg at 02/23/22 1206   dextrose 5% lactated ringers 1,000 mL with potassium chloride 30 mEq infusion   Intravenous Continuous Mercy Riding, MD 75 mL/hr at 02/23/22 0821 New Bag at 02/23/22 0821   docusate sodium (COLACE) capsule 100 mg  100 mg Oral BID Marylyn Ishihara,  Tyrone A, DO       enoxaparin (LOVENOX) injection 40 mg  40 mg Subcutaneous Q24H Gonfa, Taye T, MD   40 mg at 02/22/22 1405   feeding supplement (ENSURE ENLIVE / ENSURE PLUS) liquid 237 mL  237 mL Oral TID BM Gonfa, Taye T, MD   237 mL at 02/23/22 0953   FLUoxetine (PROZAC) capsule 20 mg  20 mg Oral q morning Kyle, Tyrone A, DO   20 mg at 93/90/30 0923   folic acid-pyridoxine-cyancobalamin (FOLTX) 2.5-25-2 MG per tablet 1 tablet  1 tablet Oral Daily Kyle, Tyrone A, DO   1 tablet at 02/23/22 0942   guaiFENesin (MUCINEX) 12 hr tablet 600 mg  600 mg Oral BID Marylyn Ishihara, Tyrone A, DO   600 mg at 02/20/22 2133   HYDROcodone-acetaminophen (NORCO/VICODIN) 5-325 MG per tablet 1 tablet  1 tablet Oral Q6H PRN Cyndia Skeeters, Taye T, MD       insulin aspart (novoLOG) injection 0-9 Units  0-9 Units Subcutaneous TID WC Kyle, Tyrone A, DO   1 Units at 02/22/22 1414   magic mouthwash w/lidocaine  5 mL Oral TID Wendee Beavers T, MD   5 mL at 02/23/22 0945   metoprolol tartrate (LOPRESSOR) tablet 12.5 mg  12.5 mg Oral BID Wendee Beavers T, MD   12.5 mg at 02/23/22 0943   polyethylene glycol (MIRALAX / GLYCOLAX) packet 17 g  17 g Oral Daily Kyle, Tyrone A, DO       potassium chloride (KLOR-CON) packet 40 mEq  40 mEq Oral Q4H Gonfa, Taye T, MD   40 mEq at 02/23/22 0945   senna-docusate (Senokot-S) tablet 1 tablet  1 tablet Oral BID PRN Mercy Riding, MD       sodium phosphate (FLEET) 7-19 GM/118ML enema 1 enema  1 enema Rectal Daily PRN Mercy Riding, MD         Discharge Medications: Please see discharge summary for a list of discharge medications.  Relevant Imaging Results:  Relevant Lab Results:   Additional Information SSN [id number]  Leeroy Cha, RN

## 2022-02-23 NOTE — Progress Notes (Signed)
?                                                   ?Palliative Care Progress Note, Assessment & Plan  ? ?Patient Name: Derek [name]       Date: 02/23/2022 ?DOB: April 02, 1946  Age: 76 y.o. MRN#: [id number] ?Attending Physician: Mercy Riding, MD ?Primary Care Physician: Lavone Orn, MD ?Admit Date: 02/20/2022 ? ?Reason for Consultation/Follow-up: Establishing goals of care ? ?Subjective: ?Patient is sitting in bed in no apparent distress.  He acknowledges my presence and is able to make his wishes known.  He is somnolent and will open his eyes when spoken to.  However, he does not engage in meaningful conversation.  Wife is at bedside. ? ?HPI: ?76 y.o. male  with past medical history of dementia, CAD/CABG, DM2, HTN, OSA (CPAP) and left hip fracture with pin 02/12/22 (d/c 02/16/22 to SNF) admitted on 02/20/2022 with RLL PNA. Pt was having SOB, fever, and poor PO intake at home. He is receiving IV antibiotics and attempting to ween oxygen from 6-9L [place] (no O2 use at home).    ?  ?PMT was consulted to discuss code status and review GOC. ? ?Summary of counseling/coordination of care: ?After reviewing the patient's chart and assessing the patient at bedside, I again met with patient's wife Derek [name] at bedside.  She shares she is happy to see that his oxygen demands have decreased from 7 L to 4 L nasal cannula.  She is happy to see he is "going the right way".   ? ?However, she remains concerned that he is sleeping throughout the day and evening.  She says he has been doing this for over 2 years so she is hopeful that rehab will be able to "perk him up" before taking him home. ? ?I revisited patient's CODE STATUS.  Patient's wife confirmed that she would like for him to remain a full code and receive any and all offered, available, and appropriate medical interventions to "save his life".  Full code and full scope remain.  She shares that things are going in the right direction and she does not want to have to think about that right now. ? ?She shares she is looking forward to him discharging back to [facility]. ? ?Goals are clear - full code/full scope ?Plan is set for patient to d/c back to [facility] when stable. ? ?PMT will continue to monitor the patient throughout his hospitalization.  PMT will shadow his chart and intervene at patient/family or medical team's request, if goals change, or if patient's health status declines. ? ?Code Status: ?Full code ? ?Prognosis: ?Unable to determine ? ?Discharge Planning: ?SNF - [place] ? ?Care plan was discussed with patient, patient's wife Derek [name] ? ?Physical Exam ?Vitals reviewed.  ?Constitutional:   ?   Appearance: Normal appearance.  ?HENT:  ?   Head: Normocephalic.  ?   Nose: Nose normal.  ?   Mouth/Throat:  ?   Mouth: Mucous membranes are moist.  ?Eyes:  ?   Pupils: Pupils are equal, round, and reactive to light.  ?Cardiovascular:  ?   Rate and Rhythm: Normal rate.  ?   Pulses: Normal pulses.  ?Pulmonary:  ?   Effort: Pulmonary effort is normal.  ?   Breath sounds: Rhonchi present.  ?Abdominal:  ?  Palpations: Abdomen is soft.  ?Musculoskeletal:  ?   Comments: Generalized weaknes, MAETC  ?Neurological:  ?   Mental Status: He is alert. Mental status is at baseline.  ?Psychiatric:     ?   Behavior: Behavior normal.     ?   Thought Content: Thought content normal.     ?   Judgment: Judgment normal.  ?         ? ?Palliative Assessment/Data: 50% ? ? ? ?Total Time 25 minutes  ?Greater than 50%  of this time was spent counseling and coordinating care related to the above assessment and plan. ? ?Thank you for allowing the Palliative Medicine Team to assist in the care of this patient. ? ? L. , DNP, FNP-BC ?Palliative Medicine Team ?Team Phone # [phone] ?  ?

## 2022-02-23 NOTE — Progress Notes (Signed)
Speech Language Pathology Treatment: Dysphagia  ?Patient Details ?Name: Derek [name] ?MRN: [id number] ?DOB: 27-Apr-1946 ?Today's Date: 02/23/2022 ?Time: 1040-1108 ?SLP Time Calculation (min) (ACUTE ONLY): 28 min ? ?Assessment / Plan / Recommendation ?Clinical Impression ? Pt seen at bedside for assessment of diet tolerance, education, and to determine readiness to advance to solid textures. Pt was resting in bed. Wife present. Other family members arrived later. Pt was awake, but still sleepy. Voice quality was clear, but low in volume. He continues to have difficulty following commands consistently. Pt tolerated trials of puree without overt s/s aspiration, however, he did have diffuse oral residue which was removed with suction. Small sips of thin liquid were tolerated well, however, his ability to use a straw successfully was variable. Will continue full liquid at this time, given risk of increased oral residue with advanced textures. SLP will continue to follow pt acutely to assess readiness for advanced textures. ?  ?HPI HPI: Pt is a 76 y.o. male who presented with dyspnea and fever. CXR 5/14: New airspace disease in the medial right base compatible with atelectasis or pneumonia. Per RN note 5/14, pt given coffee and "he began coughing and spit it up along with small amount of thick mucus." PMH: dementia, CAD s/p CABG, DM2, HTN, OSA on CPAP, mild cognitive impairment. ?  ?   ?SLP Plan ? Continue with current plan of care ? ?  ?  ?Recommendations for follow up therapy are one component of a multi-disciplinary discharge planning process, led by the attending physician.  Recommendations may be updated based on patient status, additional functional criteria and insurance authorization. ?  ? ?Recommendations  ?Diet recommendations: Thin liquid;Other(comment) (full liquids) ?Liquids provided via: Cup;Straw (pt's ability to use a straw appears to be variable.) ?Medication Administration: Whole meds with  puree ?Supervision: Full supervision/cueing for compensatory strategies ?Compensations: Slow rate;Small sips/bites;Minimize environmental distractions;Other (Comment) (check for oral residue and clear out. Suction set up) ?Postural Changes and/or Swallow Maneuvers: Seated upright 90 degrees;Upright 30-60 min after meal  ?   ?    ?   ? ? ? ? Oral Care Recommendations: Oral care BID;Staff/trained caregiver to provide oral care ?Follow Up Recommendations: Other (comment) (TBD) ?Assistance recommended at discharge: Frequent or constant Supervision/Assistance ?SLP Visit Diagnosis: Dysphagia, unspecified (R13.10) ?Plan: Continue with current plan of care ? ? ? ? ?  ?  ? ?Promiss Labarbera B. Cohen Doleman, MSP, CCC-SLP ?Speech Language Pathologist ?Office: [phone] ? ?Shonna Chock ?02/23/2022, 11:14 AM ?

## 2022-02-23 NOTE — Plan of Care (Signed)
  Problem: Nutrition: Goal: Adequate nutrition will be maintained Outcome: Progressing   Problem: Coping: Goal: Level of anxiety will decrease Outcome: Progressing   Problem: Pain Managment: Goal: General experience of comfort will improve Outcome: Progressing   

## 2022-02-23 NOTE — Progress Notes (Signed)
?PROGRESS NOTE ? ?Derek [name] TIR:[id number] DOB: Apr 08, 1946  ? ?PCP: Lavone Orn, MD ? ?Patient is from: SNF.  [facility] ? ?DOA: 02/20/2022 LOS: 3 ? ?Chief complaints ?Chief Complaint  ?Patient presents with  ? Fever  ?  ? ?Brief Narrative / Interim history: ?76 year old M with PMH of dementia, CAD/CABG, DM-2, HTN, OSA on CPAP, recent hospitalization for left hip fracture after mechanical fall for which he underwent closed reduction with percutaneous pin fixation on 5/5 and discharged to SNF on 5/10 returning with shortness of breath, fever, poor p.o. intake, solid dysphagia, lethargy and hypoxia to 80s, and admitted for severe sepsis with acute hypoxic respiratory failure due to RLL pneumonia concerning for aspiration pneumonia given patient's dysphagia.  Initially started on IV cefepime and NRB.  IV Flagyl added the next day. ? ?Blood culture, urine culture, MRSA PCR, COVID-19 and influenza PCR negative.  Antibiotics de-escalated to IV Unasyn.  Therapy recommends dysphagia 2 diet.  Respiratory failure improved.  Currently saturating in mid 90s on 4 L by [place].  Poor long-term prognosis.  Palliative medicine following.  ? ?Subjective: ?Seen and examined earlier this morning.  No major events overnight of this morning.  More awake and alert today.  Currently saturating at 94% on 4 L.  No complaints but not a great historian.  He denies chest pain, shortness of breath, GI or UTI symptoms.  He is awake and oriented to self, place and person. ? ?Objective: ?Vitals:  ? 02/22/22 2000 02/22/22 2208 02/23/22 0200 02/23/22 1000  ?BP: 127/68  106/67   ?Pulse:  96 79 80  ?Resp: (!) 27 (!) 23 (!) 21   ?Temp: 98.9 ?F (37.2 ?C)  98.6 ?F (37 ?C) 98 ?F (36.7 ?C)  ?TempSrc: Oral  Axillary Oral  ?SpO2: 92% 95% 93% 94%  ?Weight:      ?Height:      ? ? ?Examination ? ?GENERAL: No apparent distress.  Nontoxic. ?HEENT: MMM.  Vision and hearing grossly intact.  ?NECK: Supple.  No apparent JVD.  ?RESP: 94% on 4 L.  No IWOB.  Rhonchi  bilateral. ?CVS:  RRR. Heart sounds normal.  ?ABD/GI/GU: BS+. Abd soft, NTND.  ?MSK/EXT:  Moves extremities. No apparent deformity. No edema.  ?SKIN: no apparent skin lesion or wound ?NEURO: Awake and alert. Oriented to self, place and person.  Follows commands.  No apparent focal neuro deficit. ?PSYCH: Calm. Normal affect.  ? ?Procedures:  ?None ? ?Microbiology summarized: ?COVID-19, influenza and MRSA PCR nonreactive. ?Blood cultures NGTD. ?Urine culture NGTD. ? ?Assessment and Plan: ?* Severe sepsis due to aspiration pneumonia ?Febrile, tachycardic, tachypneic, hypoxic with leukocytosis, lactic acidosis and mental status change on presentation.  Imaging suggest RLL infiltrate concerning for aspiration pneumonia given patient's dementia and dysphagia.  Sepsis physiology and respiratory failure improved.  Blood and urine cultures NGTD.  MRSA, COVID-19 and influenza PCR reactive. Patient is immunocompromised on methotrexate for his rheumatoid arthritis. ?-Cefepime and Flagyl >> IV Unasyn>> ?-Aspiration precaution ?-Dysphagia 2 diet per SLP ?-Aspiration precaution ?-Ongoing goals of care discussion ? ?Acute respiratory failure with hypoxia ([place]) ?Likely due to aspiration pneumonia and sepsis.  Desaturated to 87% on RA.  He was a started on 15 L by NRB and recovered to mid 90s.  Currently saturating in 90s on 4 L. ?-Manage aspiration pneumonia as above ?-Wean oxygen as able ?-CPAP at night ? ?Goals of care, counseling/discussion ?Extensive goals of care discussion with patient's wife, daughter and son-in-law at bedside today.  Patient likes to take  some time to think about it.  Palliative on board as well. ?-Remains full code with full scope of care ? ?Elevated liver enzymes ?Pattern suggest rhabdo alcohol.  However, CK normal.  Does not drink.  LFT stable. ?-Stopped Crestor ?-Continue monitoring ? ?Decreased oral intake ?Likely due to acute illness, dysphagia and dementia.   ?Nutrition Problem: Moderate  Malnutrition ?Etiology: chronic illness (dementia) ?Signs/Symptoms: moderate fat depletion, moderate muscle depletion ?Interventions: Ensure Enlive (each supplement provides 350kcal and 20 grams of protein), MVI ? ?Dysphagia and odynophagia ?Some pain and struggle with solid lately.  No apparent oral region on exam but difficult to assess pharynx ?-Aspiration precaution and dysphagia 3 diet ?-Trial of Magic mouthwash with lidocaine ? ?Dementia without behavioral disturbance ([place]) ?Per patient's wife, diagnosed with dementia about 3 years ago and his cognition has been declining lately.  He is oriented to self, place and person. ?-Reorientation and delirium precaution ?-Avoid or minimize sedating medications. ? ?Closed left hip fracture ([place]) ?Had closed reduction with percutaneous pin fixation on 5/5 and discharged to SNF on 5/10.  Surgical dressing in place. ?-WBAT, posterior hip precaution and PT/OT ?-Use Lovenox for VTE prophylaxis while in-house.  Full dose aspirin on discharge ?-Outpatient follow-up with orthopedic surgeon as previously planned ? ?Hyperbilirubinemia ?Likely from sepsis.  Continue monitoring. ? ?Hypokalemia and hypophosphatemia ?K3.2.  Hypophosphatemia resolved. ?-P.o. KCl 40x2 ?-IV fluid with KCl ? ?Rheumatoid arthritis ([place]) ?Hold home methotrexate. ? ?Benign essential hypertension ?Normotensive. ?-Continue home metoprolol. ?-Continue holding Lasix ? ?CAD s/p CABG in 2019 at [facility] ?Denies chest pain.  Seems to be stable. ?-Continue home medication ? ?Controlled NIDDM-2 with hyperlipidemia ?A1c 5.1% about a week ago.  On Jardiance, metformin and Crestor at home. ?Recent Labs  ?Lab 02/22/22 ?1149 02/22/22 ?1631 02/22/22 ?2129 02/23/22 ?6433 02/23/22 ?1201  ?GLUCAP 121* 102* 91 113* 149*  ?-Continue SSI-sensitive ?-Hold home Jardiance and metformin ?-Hold Crestor in the setting of elevated LFT ? ? ?OSA on CPAP ?Continue nightly CPAP ? ? ? ?DVT prophylaxis:  ?enoxaparin (LOVENOX) injection 40 mg  Start: 02/21/22 1400 ?SCDs Start: 02/20/22 1530 ? ?Code Status: Full code ?Family Communication: Updated patient's wife, daughter and son-in-law at bedside. ?Level of care: Progressive ?Status is: Inpatient ?Remains inpatient appropriate because: Severe sepsis and acute respiratory failure with hypoxia due to aspiration pneumonia ? ? ?Final disposition: Likely SNF once medically stable. ?Consultants:  ?Palliative medicine ? ?Sch Meds:  ?Scheduled Meds: ? docusate sodium  100 mg Oral BID  ? enoxaparin (LOVENOX) injection  40 mg Subcutaneous Q24H  ? feeding supplement  237 mL Oral TID BM  ? FLUoxetine  20 mg Oral q morning  ? folic acid-pyridoxine-cyancobalamin  1 tablet Oral Daily  ? guaiFENesin  600 mg Oral BID  ? insulin aspart  0-9 Units Subcutaneous TID WC  ? magic mouthwash w/lidocaine  5 mL Oral TID  ? metoprolol tartrate  12.5 mg Oral BID  ? polyethylene glycol  17 g Oral Daily  ? ?Continuous Infusions: ? ampicillin-sulbactam (UNASYN) IV 3 g (02/23/22 1418)  ? dextrose 5 % lactated ringers with kcl 75 mL/hr at 02/23/22 2951  ? ?PRN Meds:.acetaminophen **OR** acetaminophen, chlorproMAZINE (THORAZINE) injection, HYDROcodone-acetaminophen, senna-docusate, sodium phosphate ? ?Antimicrobials: ?Anti-infectives (From admission, onward)  ? ? Start     Dose/Rate Route Frequency Ordered Stop  ? 02/22/22 0800  Ampicillin-Sulbactam (UNASYN) 3 g in sodium chloride 0.9 % 100 mL IVPB       ? 3 g ?200 mL/hr over 30 Minutes Intravenous Every 6 hours  02/22/22 0707 02/27/22 0759  ? 02/21/22 1800  ceFEPIme (MAXIPIME) 2 g in sodium chloride 0.9 % 100 mL IVPB  Status:  Discontinued       ? 2 g ?200 mL/hr over 30 Minutes Intravenous Every 8 hours 02/21/22 1210 02/22/22 0707  ? 02/21/22 1000  ceFEPIme (MAXIPIME) 2 g in sodium chloride 0.9 % 100 mL IVPB  Status:  Discontinued       ? 2 g ?200 mL/hr over 30 Minutes Intravenous Every 12 hours 02/20/22 1329 02/20/22 1746  ? 02/21/22 1000  metroNIDAZOLE (FLAGYL) IVPB 500 mg  Status:   Discontinued       ? 500 mg ?100 mL/hr over 60 Minutes Intravenous Every 12 hours 02/21/22 0751 02/22/22 0707  ? 02/20/22 1900  ceFEPIme (MAXIPIME) 2 g in sodium chloride 0.9 % 100 mL IVPB  Status:  Discontinued       ? 2

## 2022-02-23 NOTE — Progress Notes (Signed)
Patient was lethargic. GCS 10 E3V1M6. Due orals medications were not given. Nurse practitioner on duty was infrormed.  ?

## 2022-02-23 NOTE — Progress Notes (Signed)
Physical Therapy Treatment ?Patient Details ?Name: Derek [name] ?MRN: [id number] ?DOB: [birth year] ?Today's Date: 02/23/2022 ? ? ?History of Present Illness Derek [name] is a 76 y.o. male with medical history significant of CAD s/p CABG, DM2, HTN, OSA on CPAP, mild cognitive impairment. Presenting with dyspnea and fever. Recently admitted for hip fracture, s/p ORIF. Was sent to SNF for rehab on 5/10. While there he apparently became dyspneic and febrile. admitted with RLL pna ? ?  ?PT Comments  ? ? General Comments: AxO x 1.5 for most part sleepy/groggy.  Speech is difficult to understand/mumble. ?Sitting EOB was difficult and required + 2 Max/Total Assist  General bed mobility comments: required + 2 assist and increased time to transition to EOB.  Pt was unable to support self upright.  "let me lay down".  Required Max Asisst to maintain slumped posture while attempting to increae arousal.  Vital taken,  BP 121/87, HR 74 and sats 88% with 4 lts.  Unable to get pt to support himself while seated EOB to attempt sit to stand, asssited back to bed and positioned to comfort. ?Pt plans to return to SNF.   ?Recommendations for follow up therapy are one component of a multi-disciplinary discharge planning process, led by the attending physician.  Recommendations may be updated based on patient status, additional functional criteria and insurance authorization. ? ?Follow Up Recommendations ? Skilled nursing-short term rehab (<3 hours/day) ?  ?  ?Assistance Recommended at Discharge Frequent or constant Supervision/Assistance  ?Patient can return home with the following Two people to help with walking and/or transfers;Two people to help with bathing/dressing/bathroom;Direct supervision/assist for medications management;Help with stairs or ramp for entrance;Assist for transportation;Assistance with feeding;Direct supervision/assist for financial management;Assistance with cooking/housework ?  ?Equipment  Recommendations ? None recommended by PT  ?  ?Recommendations for Other Services   ? ? ?  ?Precautions / Restrictions Precautions ?Precautions: Fall ?Precaution Comments: recent hip surgery and new PNA on 4 lts nasal ?Restrictions ?Weight Bearing Restrictions: No ?LLE Weight Bearing: Weight bearing as tolerated  ?  ? ?Mobility ? Bed Mobility ?Overal bed mobility: Needs Assistance ?Bed Mobility: Supine to Sit, Sit to Supine ?  ?  ?Supine to sit: +2 for physical assistance, +2 for safety/equipment, Max assist ?Sit to supine: Total assist, +2 for physical assistance, +2 for safety/equipment ?  ?General bed mobility comments: required + 2 assist and increased time to transition to EOB.  Pt was unable to support self upright.  "let me lay down".  Required Max Asisst to maintain slumped posture while attempting to increae arousal.  Vital taken,  BP 121/87, HR 74 and sats 88% with 4 lts.  Unable to get pt to support himself while seated EOB to attempt sit to stand, asssited back to bed and positioned to comfort. ?  ? ?Transfers ?  ?  ?  ?  ?  ?  ?  ?  ?  ?General transfer comment: unable to attempt due to poor sitting tolerance. ?  ? ?Ambulation/Gait ?  ?  ?  ?  ?  ?  ?  ?General Gait Details: unable to attempt ? ? ?Stairs ?  ?  ?  ?  ?  ? ? ?Wheelchair Mobility ?  ? ?Modified Rankin (Stroke Patients Only) ?  ? ? ?  ?Balance   ?  ?  ?  ?  ?  ?  ?  ?  ?  ?  ?  ?  ?  ?  ?  ?  ?  ?  ?  ? ?  ?  Cognition Arousal/Alertness: Lethargic ?Behavior During Therapy: Flat affect ?Overall Cognitive Status: No family/caregiver present to determine baseline cognitive functioning ?  ?  ?  ?  ?  ?  ?  ?  ?  ?  ?  ?  ?  ?  ?  ?  ?General Comments: AxO x 1.5 for most part sleepy/groggy.  Speech is difficult to understand/mumble. ?  ?  ? ?  ?Exercises   ? ?  ?General Comments   ?  ?  ? ?Pertinent Vitals/Pain Pain Assessment ?Pain Assessment: Faces ?Faces Pain Scale: Hurts a little bit ?Pain Location: L hip ?Pain Descriptors / Indicators:  Grimacing ?Pain Intervention(s): Monitored during session, Repositioned  ? ? ?Home Living   ?  ?  ?  ?  ?  ?  ?  ?  ?  ?   ?  ?Prior Function    ?  ?  ?   ? ?PT Goals (current goals can now be found in the care plan section) Progress towards PT goals: Progressing toward goals ? ?  ?Frequency ? ? ? Min 3X/week ? ? ? ?  ?PT Plan Current plan remains appropriate  ? ? ?Co-evaluation   ?  ?  ?  ?  ? ?  ?AM-PAC PT "6 Clicks" Mobility   ?Outcome Measure ? Help needed turning from your back to your side while in a flat bed without using bedrails?: Total ?Help needed moving from lying on your back to sitting on the side of a flat bed without using bedrails?: Total ?Help needed moving to and from a bed to a chair (including a wheelchair)?: Total ?Help needed standing up from a chair using your arms (e.g., wheelchair or bedside chair)?: Total ?Help needed to walk in hospital room?: Total ?Help needed climbing 3-5 steps with a railing? : Total ?6 Click Score: 6 ? ?  ?End of Session Equipment Utilized During Treatment: Gait belt ?Activity Tolerance: Patient limited by fatigue;Patient limited by lethargy ?Patient left: in bed;with call bell/phone within reach;with bed alarm set;with family/visitor present ?Nurse Communication: Mobility status ?PT Visit Diagnosis: Muscle weakness (generalized) (M62.81);Difficulty in walking, not elsewhere classified (R26.2) ?  ? ? ?Time: 1212-1230 ?PT Time Calculation (min) (ACUTE ONLY): 18 min ? ?Charges:  $Therapeutic Activity: 8-22 mins          ?          ? ?Rica Koyanagi  PTA ?Acute  Rehabilitation Services ?Pager      [phone] ?Office      [phone] ? ? ?

## 2022-02-24 DIAGNOSIS — A419 Sepsis, unspecified organism: Secondary | ICD-10-CM | POA: Diagnosis not present

## 2022-02-24 DIAGNOSIS — J690 Pneumonitis due to inhalation of food and vomit: Secondary | ICD-10-CM | POA: Diagnosis not present

## 2022-02-24 DIAGNOSIS — J9601 Acute respiratory failure with hypoxia: Secondary | ICD-10-CM | POA: Diagnosis not present

## 2022-02-24 DIAGNOSIS — S72002D Fracture of unspecified part of neck of left femur, subsequent encounter for closed fracture with routine healing: Secondary | ICD-10-CM | POA: Diagnosis not present

## 2022-02-24 DIAGNOSIS — I10 Essential (primary) hypertension: Secondary | ICD-10-CM | POA: Diagnosis not present

## 2022-02-24 LAB — GLUCOSE, CAPILLARY
Glucose-Capillary: 134 mg/dL — ABNORMAL HIGH (ref 70–99)
Glucose-Capillary: 194 mg/dL — ABNORMAL HIGH (ref 70–99)
Glucose-Capillary: 141 mg/dL — ABNORMAL HIGH (ref 70–99)
Glucose-Capillary: 118 mg/dL — ABNORMAL HIGH (ref 70–99)

## 2022-02-24 LAB — COMPREHENSIVE METABOLIC PANEL
ALT: 59 U/L — ABNORMAL HIGH (ref 0–44)
AST: 103 U/L — ABNORMAL HIGH (ref 15–41)
Albumin: 2.1 g/dL — ABNORMAL LOW (ref 3.5–5.0)
Alkaline Phosphatase: 98 U/L (ref 38–126)
Anion gap: 8 (ref 5–15)
BUN: 16 mg/dL (ref 8–23)
CO2: 23 mmol/L (ref 22–32)
Calcium: 8.8 mg/dL — ABNORMAL LOW (ref 8.9–10.3)
Chloride: 113 mmol/L — ABNORMAL HIGH (ref 98–111)
Creatinine, Ser: 0.81 mg/dL (ref 0.61–1.24)
GFR, Estimated: >60 mL/min (ref >60)
Glucose, Bld: 128 mg/dL — ABNORMAL HIGH (ref 70–99)
Potassium: 3.5 mmol/L (ref 3.5–5.1)
Sodium: 144 mmol/L (ref 135–145)
Total Bilirubin: 1.1 mg/dL (ref 0.3–1.2)
Total Protein: 5.8 g/dL — ABNORMAL LOW (ref 6.5–8.1)

## 2022-02-24 LAB — CBC
HCT: 36 % — ABNORMAL LOW (ref 39.0–52.0)
Hemoglobin: 12.1 g/dL — ABNORMAL LOW (ref 13.0–17.0)
MCH: 34.1 pg — ABNORMAL HIGH (ref 26.0–34.0)
MCHC: 33.6 g/dL (ref 30.0–36.0)
MCV: 101.4 fL — ABNORMAL HIGH (ref 80.0–100.0)
Platelets: 260 10*3/uL (ref 150–400)
RBC: 3.55 MIL/uL — ABNORMAL LOW (ref 4.22–5.81)
RDW: 14.3 % (ref 11.5–15.5)
WBC: 25.5 10*3/uL — ABNORMAL HIGH (ref 4.0–10.5)
nRBC: 0 % (ref 0.0–0.2)

## 2022-02-24 LAB — MAGNESIUM: Magnesium: 1.9 mg/dL (ref 1.7–2.4)

## 2022-02-24 MED ORDER — LIP MEDEX EX OINT
TOPICAL_OINTMENT | CUTANEOUS | Status: DC | PRN
  Filled 2022-02-24: qty 7

## 2022-02-24 NOTE — Progress Notes (Signed)
?PROGRESS NOTE ? ? ? ?Derek [name]  IEP:[id number] DOB: June 24, 1946 DOA: 02/20/2022 ?PCP: Lavone Orn, MD  ? ?Brief Narrative:  ?76 year old M with PMH of dementia, CAD/CABG, DM-2, HTN, OSA on CPAP, recent hospitalization for left hip fracture after mechanical fall for which he underwent closed reduction with percutaneous pin fixation on 02/12/22 and discharged to SNF on 02/17/22 presented with shortness of breath, fever, poor oral intake, dysphagia, lethargy and hypoxia to 80s and admitted for severe sepsis/respiratory failure/right lower lobe pneumonia concerning for aspiration pneumonia and started on IV antibiotics.  Palliative care consulted for goals of care discussion. ? ?Assessment & Plan: ?  ?Severe sepsis: Present on admission ?Possible aspiration pneumonia ?Acute respiratory failure with hypoxia ?-Does not wear supplemental oxygen at home. Still requiring 4-7 L oxygen via nasal cannula.  Wean off as able. ?-Continue Unasyn. ?-Blood and urine cultures negative so far. ?-Aspiration precautions.  Diet as per SLP recommendations. ? ?Goals of care ?Physical deconditioning ?Decreased oral intake/moderate malnutrition ?-Palliative care evaluated the patient during this hospitalization.  Patient remains full code.  Overall prognosis is guarded to poor. ?- will need to return back to SNF once medically stable ? ?Dysphagia/odynophagia ?-Continue diet as per SLP recommendations.  Aspiration precaution.  Trial of Magic mouthwash with lidocaine ? ?Dementia without behavioral disturbance ?-As per patient's wife, patient's cognition has been declining lately. ?-Fall/delirium precautions.  Avoid or minimize sedating medications ? ?Recent closed left hip fracture ?-Status post surgical intervention on 02/12/2022.  Outpatient follow-up with orthopedics.  Weightbearing as tolerated.  Patient will need SNF placement after discharge.  Continue PT eval ?-Continue Lovenox for DVT prophylaxis while inpatient and switch to full  dose aspirin on discharge ? ?Leukocytosis ?-Worsening.  Monitor ? ?Elevated LFTs ?-Mildly elevated LFTs, improving.  Questionable cause.  Monitor. ? ?Rheumatoid arthritis ?-Methotrexate on hold ? ?Benign essential hypertension ?-Blood pressure stable.  Continue metoprolol.  Lasix on hold ? ?CAD status post CABG in 2019 ?-No chest pain.  Outpatient follow-up with cardiology.  Continue metoprolol ? ?Depression ?-Continue fluoxetine ? ?Diabetes mellitus type 2 ?-A1c 5.1.  Jardiance, metformin on hold continue CBGs with SSI ? ?Hyperlipidemia ?-Crestor on hold because of elevated LFTs ? ?OSA on CPAP ?-Continue nightly CPAP ? ?Hypokalemia ?-Resolved ? ? ?DVT prophylaxis: lovenox ?Code Status:  Full code ?Family Communication: wife at bedside ?Disposition Plan: ?Status is: Inpatient ?Remains inpatient appropriate because: of severity of illness ? ? ? ?Consultants: Palliative care ? ?Procedures: None ? ?Antimicrobials:  ?Anti-infectives (From admission, onward)  ? ? Start     Dose/Rate Route Frequency Ordered Stop  ? 02/22/22 0800  Ampicillin-Sulbactam (UNASYN) 3 g in sodium chloride 0.9 % 100 mL IVPB       ? 3 g ?200 mL/hr over 30 Minutes Intravenous Every 6 hours 02/22/22 0707 02/27/22 0759  ? 02/21/22 1800  ceFEPIme (MAXIPIME) 2 g in sodium chloride 0.9 % 100 mL IVPB  Status:  Discontinued       ? 2 g ?200 mL/hr over 30 Minutes Intravenous Every 8 hours 02/21/22 1210 02/22/22 0707  ? 02/21/22 1000  ceFEPIme (MAXIPIME) 2 g in sodium chloride 0.9 % 100 mL IVPB  Status:  Discontinued       ? 2 g ?200 mL/hr over 30 Minutes Intravenous Every 12 hours 02/20/22 1329 02/20/22 1746  ? 02/21/22 1000  metroNIDAZOLE (FLAGYL) IVPB 500 mg  Status:  Discontinued       ? 500 mg ?100 mL/hr over 60 Minutes Intravenous Every 12 hours 02/21/22  4627 02/22/22 0707  ? 02/20/22 1900  ceFEPIme (MAXIPIME) 2 g in sodium chloride 0.9 % 100 mL IVPB  Status:  Discontinued       ? 2 g ?200 mL/hr over 30 Minutes Intravenous Every 12 hours 02/20/22 1746  02/21/22 1210  ? 02/20/22 1345  vancomycin (VANCOREADY) IVPB 1500 mg/300 mL       ? 1,500 mg ?150 mL/hr over 120 Minutes Intravenous STAT 02/20/22 1336 02/20/22 1644  ? 02/20/22 1030  cefTRIAXone (ROCEPHIN) 2 g in sodium chloride 0.9 % 100 mL IVPB  Status:  Discontinued       ? 2 g ?200 mL/hr over 30 Minutes Intravenous Every 24 hours 02/20/22 1023 02/20/22 1329  ? 02/20/22 1030  azithromycin (ZITHROMAX) 500 mg in sodium chloride 0.9 % 250 mL IVPB  Status:  Discontinued       ? 500 mg ?250 mL/hr over 60 Minutes Intravenous Every 24 hours 02/20/22 1023 02/20/22 1329  ? ?  ? ? ? ?Subjective: ?Patient seen and examined at bedside.  Wife at bedside.  No overnight fever, seizures, vomiting reported.  Oral intake is still poor. ? ?Objective: ?Vitals:  ? 02/24/22 0500 02/24/22 0600 02/24/22 0815 02/24/22 1250  ?BP:  118/72 114/69 116/88  ?Pulse:   100 94  ?Resp: (!) 21 (!) '22 19 19  '$ ?Temp:   98 ?F (36.7 ?C) 97.9 ?F (36.6 ?C)  ?TempSrc:   Oral Oral  ?SpO2:   95% 96%  ?Weight:      ?Height:      ? ? ?Intake/Output Summary (Last 24 hours) at 02/24/2022 1322 ?Last data filed at 02/24/2022 0350 ?Gross per 24 hour  ?Intake 691 ml  ?Output 1900 ml  ?Net -1209 ml  ? ?Filed Weights  ? 02/20/22 1530  ?Weight: 69.2 kg  ? ? ?Examination: ? ?General exam: Appears calm and comfortable.  Looks chronically ill and deconditioned.  Currently on 7 L oxygen via nasal cannula. ?Respiratory system: Bilateral decreased breath sounds at bases with intermittent tachypnea and scattered crackles ?Cardiovascular system: S1 & S2 heard, Rate controlled ?Gastrointestinal system: Abdomen is nondistended, soft and nontender. Normal bowel sounds heard. ?Extremities: No cyanosis, clubbing; trace lower extremity edema present ?Central nervous system: Awake, extremely slow to respond, slightly confused. No focal neurological deficits. Moving extremities ?Skin: No rashes, lesions or ulcers ?Psychiatry: Affect is mostly flat.  No signs of agitation. ? ? ? ?Data  Reviewed: I have personally reviewed following labs and imaging studies ? ?CBC: ?Recent Labs  ?Lab 02/20/22 ?1017 02/21/22 ?0938 02/22/22 ?1829 02/23/22 ?9371 02/24/22 ?0506  ?WBC 13.3* 12.9* 11.7* 16.6* 25.5*  ?NEUTROABS 11.9*  --   --   --   --   ?HGB 14.4 13.6 15.1 12.0* 12.1*  ?HCT 40.9 41.6 45.0 35.8* 36.0*  ?MCV 99.0 102.0* 98.9 100.3* 101.4*  ?PLT 338 361 302 325 260  ? ?Basic Metabolic Panel: ?Recent Labs  ?Lab 02/20/22 ?1017 02/20/22 ?1540 02/21/22 ?6967 02/22/22 ?8938 02/23/22 ?1017 02/24/22 ?0506  ?NA 142  --  144 140 143 144  ?K 3.2*  --  3.8 3.3* 3.2* 3.5  ?CL 110  --  111 109 111 113*  ?CO2 18*  --  19* '22 25 23  '$ ?GLUCOSE 102*  --  88 81 107* 128*  ?BUN 22  --  '23 23 18 16  '$ ?CREATININE 1.04  --  0.83 0.78 0.84 0.81  ?CALCIUM 9.4  --  9.5 9.0 8.8* 8.8*  ?MG  --  2.3  --  2.1 2.1 1.9  ?PHOS  --   --   --  1.6*  --   --   ? ?GFR: ?Estimated Creatinine Clearance: 75.9 mL/min (by C-G formula based on SCr of 0.81 mg/dL). ?Liver Function Tests: ?Recent Labs  ?Lab 02/20/22 ?1017 02/20/22 ?1540 02/21/22 ?9935 02/22/22 ?7017 02/23/22 ?7939 02/24/22 ?0506  ?AST 52*  --  60* 143* 130* 103*  ?ALT 31  --  31 61* 66* 59*  ?ALKPHOS 102  --  92 92 101 98  ?BILITOT 1.5* 1.5* 1.4* 1.1 1.0 1.1  ?PROT 7.2  --  6.7 6.0* 6.0* 5.8*  ?ALBUMIN 2.8*  --  2.7* 2.2* 2.3* 2.1*  ? ?No results for input(s): LIPASE, AMYLASE in the last 168 hours. ?No results for input(s): AMMONIA in the last 168 hours. ?Coagulation Profile: ?Recent Labs  ?Lab 02/20/22 ?1017 02/21/22 ?0300  ?INR 1.4* 1.4*  ? ?Cardiac Enzymes: ?Recent Labs  ?Lab 02/22/22 ?9233  ?CKTOTAL 119  ? ?BNP (last 3 results) ?No results for input(s): PROBNP in the last 8760 hours. ?HbA1C: ?No results for input(s): HGBA1C in the last 72 hours. ?CBG: ?Recent Labs  ?Lab 02/23/22 ?1201 02/23/22 ?1626 02/23/22 ?1956 02/24/22 ?0717 02/24/22 ?1159  ?GLUCAP 149* 149* 122* 134* 194*  ? ?Lipid Profile: ?No results for input(s): CHOL, HDL, LDLCALC, TRIG, CHOLHDL, LDLDIRECT in the last 72  hours. ?Thyroid Function Tests: ?No results for input(s): TSH, T4TOTAL, FREET4, T3FREE, THYROIDAB in the last 72 hours. ?Anemia Panel: ?Recent Labs  ?  02/22/22 ?0076  ?[id number] 1,425*  ? ?Sepsis Labs

## 2022-02-24 NOTE — Progress Notes (Signed)
After reviewing the patient's chart and assessing the patient at bedside, I met with patient and his wife Derek [name] at bedside. We discussed patient's current health status as declining. Patient continues to be more lethargic, requiring more supplemental oxygen, and is not able to participate as well with PT/OT. ? ?Derek [name] remains hopeful that patient will get stronger and be able to return to [facility] for rehab. ? ?We discussed dementia and its progressive, irreversible nature. She shares she has seen a significant decline in his mental faculties over the last two years. She reports he sleeps almost all day at home, even before he fell and had the hip surgery. ? ?We discussed his functional, nutritional, and mental statuses. I conveyed my concern that patient is not improving. Derek [name] says she knows he needs to get stronger to even get out of the hospital to go to rehab. She remains hopeful and relies on her faith in God to decide what will happen with her husband. ? ?Full code and full scope remains. ? ?PMT will continue to follow the patient throughout his hospitalization.  ? ?PE: ? ?Lethargic, will open eyes to verbal stimuli but quickly closes them, does not verbalize responses to me, UEs mild non pitting edema, left hip surgical site clear, dry, with dressng intact, MAETC ? ?35 minutes ? ?Derek Shinichi Anguiano, DNP, FNP-BC ?Palliative Medicine Team ?Team Phone # [phone] ? ?Greater than 50% of this time was spent counseling and coordinating care related to the above assessment and plan.  ?

## 2022-02-24 NOTE — Plan of Care (Signed)
  Problem: Coping: Goal: Level of anxiety will decrease Outcome: Progressing   Problem: Pain Managment: Goal: General experience of comfort will improve Outcome: Progressing   

## 2022-02-24 NOTE — Progress Notes (Signed)
Occupational Therapy Treatment ?Patient Details ?Name: Derek [name] ?MRN: [id number] ?DOB: 1945-12-23 ?Today's Date: 02/24/2022 ? ? ?History of present illness Derek [name] is a 76 y.o. male with medical history significant of CAD s/p CABG, DM2, HTN, OSA on CPAP, mild cognitive impairment. Presenting with dyspnea and fever. Recently admitted for hip fracture, s/p ORIF. Was sent to SNF for rehab on 5/10. While there he apparently became dyspneic and febrile. admitted with RLL pna ?  ?OT comments ? Patient was noted to have increased lethargy on this date impacting participation in session. Patient was noted to require increased assistance compared to evaluation on this date. Patients wife and nurse were educated on positioning recommendations to prevent edema build up. Patient's discharge plan remains appropriate at this time. OT will continue to follow acutely.  ? ?  ? ?Recommendations for follow up therapy are one component of a multi-disciplinary discharge planning process, led by the attending physician.  Recommendations may be updated based on patient status, additional functional criteria and insurance authorization. ?   ?Follow Up Recommendations ? Skilled nursing-short term rehab (<3 hours/day)  ?  ?Assistance Recommended at Discharge Frequent or constant Supervision/Assistance  ?Patient can return home with the following ? A lot of help with walking and/or transfers;A lot of help with bathing/dressing/bathroom;Assistance with cooking/housework;Direct supervision/assist for medications management;Direct supervision/assist for financial management;Assist for transportation;Help with stairs or ramp for entrance ?  ?Equipment Recommendations ? None recommended by OT  ?  ?Recommendations for Other Services   ? ?  ?Precautions / Restrictions Precautions ?Precautions: Fall ?Precaution Comments: monitor O2 on HFNC 7L/min ?Restrictions ?Weight Bearing Restrictions: No ?LLE Weight Bearing: Weight bearing as  tolerated ?Other Position/Activity Restrictions: per previous notes recent admission for L hip fx  ? ? ?  ? ?Mobility Bed Mobility ?Overal bed mobility: Needs Assistance ?Bed Mobility: Supine to Sit, Sit to Supine ?  ?  ?Supine to sit: Total assist, HOB elevated ?  ?  ?General bed mobility comments: patient was total assistance to attempt to sit on edge of bed today with less than 5% effort from patient. ?  ? ?  ?Balance Overall balance assessment: Needs assistance ?  ?Sitting balance-Leahy Scale: Zero ?  ?  ?  ?  ?   ? ?ADL either performed or assessed with clinical judgement  ? ?ADL Overall ADL's : Needs assistance/impaired ?  ?  ?Grooming: Moderate assistance;Bed level;Wash/dry face ?Grooming Details (indicate cue type and reason): patient was able to wipe mouth when asked but unable to lift arm to get eyes with physical assist provided to reach. ?  ?  ?  ?  ?  ?  ?  ?  ?General ADL Comments: patient attempted to sit on edge of bed with therapist wtih TD for BLE to get to edge of bed with TD for trunk control to attempt sitting up. patient put forth less than 5% of effort on this date which is a change from session on 5/15. patients wife reported that patient wanted to get up and walk today. patient would arouse minimally during session but when asked to assist with complete transition to edge of bed patient shook head no. patient was returned to supine at this time with max A x 2 for repositioning in bed. patient and wife were educated on positioning in bed to reduce BUE edema with slight edema noted in bilateral hands. patients wife verbalized understanding. patient BLE were positioned to float heels as well. with wife educated on importance  of positioning this way. patients wife verbalized understanding. patients nurse was educted on the same. nurse verbalized understanding. ?  ? ? ? ?Cognition Arousal/Alertness: Lethargic ?Behavior During Therapy: Flat affect ?  ?  ?  ?  ?  ?  ?  ?General Comments: patient  was able to arouse to his name but noted to be very soft of voice compared to session on 5/15. ?  ?  ?   ?Exercises   ?Shoulder Instructions   ?General Comments   ? ? ?Pertinent Vitals/ Pain       Pain Assessment ?Pain Assessment: Faces ?Faces Pain Scale: Hurts a little bit ?Pain Location: L hip ?Pain Descriptors / Indicators: Grimacing ?Pain Intervention(s): Monitored during session, Repositioned ? ?Home Living   ?Prior Functioning/Environment   ? ?Frequency ? Min 2X/week  ? ? ? ? ?  ?Progress Toward Goals ? ?OT Goals(current goals can now be found in the care plan section) ? Progress towards OT goals: Not progressing toward goals - comment (patients increased lethargy and decreased efforts on this date.) ? ?   ?Plan Discharge plan remains appropriate   ? ?Co-evaluation ? ? ?   ?  ?  ?  ?  ? ?  ?AM-PAC OT "6 Clicks" Daily Activity     ?Outcome Measure ? ? Help from another person eating meals?: Total ?Help from another person taking care of personal grooming?: Total ?Help from another person toileting, which includes using toliet, bedpan, or urinal?: Total ?Help from another person bathing (including washing, rinsing, drying)?: Total ?Help from another person to put on and taking off regular upper body clothing?: Total ?Help from another person to put on and taking off regular lower body clothing?: Total ?6 Click Score: 6 ? ?  ?End of Session   ? ?OT Visit Diagnosis: Other abnormalities of gait and mobility (R26.89);Pain ?  ?Activity Tolerance Patient limited by lethargy;Patient limited by fatigue ?  ?Patient Left in bed;with call bell/phone within reach;with family/visitor present ?  ?Nurse Communication Other (comment) (positioning recommendations) ?  ? ?   ? ?Time: 0938-1829 ?OT Time Calculation (min): 21 min ? ?Charges: OT General Charges ?$OT Visit: 1 Visit ?OT Treatments ?$Therapeutic Activity: 8-22 mins ? ?Tavita Eastham OTR/L, MS ?Acute Rehabilitation Department ?Office# [phone] ?Pager#  [phone] ? ? ?[place] ?02/24/2022, 2:23 PM ?

## 2022-02-24 NOTE — Progress Notes (Signed)
Speech Language Pathology Treatment: Dysphagia  ?Patient Details ?Name: Derek [name] ?MRN: [id number] ?DOB: Nov 24, 1945 ?Today's Date: 02/24/2022 ?Time: 9767-3419 ?SLP Time Calculation (min) (ACUTE ONLY): 16 min ? ?Assessment / Plan / Recommendation ?Clinical Impression ? Pt demonstrates gradually improving attention and initiation needed to eat and drink. He did not eat much of am tray, but SLP able to assist pt in self feeding 1/2 an ensure and 1/3 of a chocolate ice cream. There was no oral holding, but pt did need constant verbal and tactile cueing to initiate self feeding as he was not able to sustain attention to task for more than 1-2 seconds. SLP discussed techniques to stimulate pts attention and void oral holding such as sweet and cod/hot temps, verbal cueing, engaging pt socially, using hand over hand assist. No signs of aspiration noted.   ?HPI HPI: Pt is a 76 y.o. male who presented with dyspnea and fever. CXR 5/14: New airspace disease in the medial right base compatible with atelectasis or pneumonia. Per RN note 5/14, pt given coffee and "he began coughing and spit it up along with small amount of thick mucus." PMH: dementia, CAD s/p CABG, DM2, HTN, OSA on CPAP, mild cognitive impairment. ?  ?   ?SLP Plan ? Continue with current plan of care ? ?  ?  ?Recommendations for follow up therapy are one component of a multi-disciplinary discharge planning process, led by the attending physician.  Recommendations may be updated based on patient status, additional functional criteria and insurance authorization. ?  ? ?Recommendations  ?Diet recommendations: Thin liquid ?Liquids provided via: Cup;Straw ?Medication Administration: Whole meds with puree ?Supervision: Full supervision/cueing for compensatory strategies ?Compensations: Slow rate;Small sips/bites;Minimize environmental distractions;Other (Comment) ?Postural Changes and/or Swallow Maneuvers: Seated upright 90 degrees;Upright 30-60 min after meal  ?    ?    ?   ? ? ? ? Plan: Continue with current plan of care ? ? ? ? ?  ?  ? ? ?Derek [name], Katherene Ponto ? ?02/24/2022, 10:50 AM ?

## 2022-02-25 ENCOUNTER — Inpatient Hospital Stay (HOSPITAL_COMMUNITY): Payer: No Typology Code available for payment source

## 2022-02-25 DIAGNOSIS — S72002D Fracture of unspecified part of neck of left femur, subsequent encounter for closed fracture with routine healing: Secondary | ICD-10-CM | POA: Diagnosis not present

## 2022-02-25 DIAGNOSIS — J690 Pneumonitis due to inhalation of food and vomit: Secondary | ICD-10-CM | POA: Diagnosis not present

## 2022-02-25 DIAGNOSIS — J9601 Acute respiratory failure with hypoxia: Secondary | ICD-10-CM | POA: Diagnosis not present

## 2022-02-25 DIAGNOSIS — R748 Abnormal levels of other serum enzymes: Secondary | ICD-10-CM

## 2022-02-25 DIAGNOSIS — I10 Essential (primary) hypertension: Secondary | ICD-10-CM | POA: Diagnosis not present

## 2022-02-25 DIAGNOSIS — A419 Sepsis, unspecified organism: Secondary | ICD-10-CM | POA: Diagnosis not present

## 2022-02-25 DIAGNOSIS — R1310 Dysphagia, unspecified: Secondary | ICD-10-CM

## 2022-02-25 LAB — CULTURE, BLOOD (ROUTINE X 2)
Culture: NO GROWTH
Culture: NO GROWTH
Special Requests: ADEQUATE

## 2022-02-25 LAB — CBC WITH DIFFERENTIAL/PLATELET
Abs Immature Granulocytes: 0.64 10*3/uL — ABNORMAL HIGH (ref 0.00–0.07)
Basophils Absolute: 0.2 10*3/uL — ABNORMAL HIGH (ref 0.0–0.1)
Basophils Relative: 1 %
Eosinophils Absolute: 0.2 10*3/uL (ref 0.0–0.5)
Eosinophils Relative: 1 %
HCT: 35.8 % — ABNORMAL LOW (ref 39.0–52.0)
Hemoglobin: 12.2 g/dL — ABNORMAL LOW (ref 13.0–17.0)
Immature Granulocytes: 2 %
Lymphocytes Relative: 10 %
Lymphs Abs: 2.7 10*3/uL (ref 0.7–4.0)
MCH: 34 pg (ref 26.0–34.0)
MCHC: 34.1 g/dL (ref 30.0–36.0)
MCV: 99.7 fL (ref 80.0–100.0)
Monocytes Absolute: 2.4 10*3/uL — ABNORMAL HIGH (ref 0.1–1.0)
Monocytes Relative: 9 %
Neutro Abs: 20.4 10*3/uL — ABNORMAL HIGH (ref 1.7–7.7)
Neutrophils Relative %: 77 %
Platelets: 283 10*3/uL (ref 150–400)
RBC: 3.59 MIL/uL — ABNORMAL LOW (ref 4.22–5.81)
RDW: 14.6 % (ref 11.5–15.5)
WBC: 26.5 10*3/uL — ABNORMAL HIGH (ref 4.0–10.5)
nRBC: 0 % (ref 0.0–0.2)

## 2022-02-25 LAB — COMPREHENSIVE METABOLIC PANEL
ALT: 51 U/L — ABNORMAL HIGH (ref 0–44)
AST: 82 U/L — ABNORMAL HIGH (ref 15–41)
Albumin: 2.2 g/dL — ABNORMAL LOW (ref 3.5–5.0)
Alkaline Phosphatase: 113 U/L (ref 38–126)
Anion gap: 8 (ref 5–15)
BUN: 16 mg/dL (ref 8–23)
CO2: 23 mmol/L (ref 22–32)
Calcium: 9 mg/dL (ref 8.9–10.3)
Chloride: 116 mmol/L — ABNORMAL HIGH (ref 98–111)
Creatinine, Ser: 0.91 mg/dL (ref 0.61–1.24)
GFR, Estimated: >60 mL/min (ref >60)
Glucose, Bld: 110 mg/dL — ABNORMAL HIGH (ref 70–99)
Potassium: 3.3 mmol/L — ABNORMAL LOW (ref 3.5–5.1)
Sodium: 147 mmol/L — ABNORMAL HIGH (ref 135–145)
Total Bilirubin: 1.4 mg/dL — ABNORMAL HIGH (ref 0.3–1.2)
Total Protein: 6.2 g/dL — ABNORMAL LOW (ref 6.5–8.1)

## 2022-02-25 LAB — GLUCOSE, CAPILLARY
Glucose-Capillary: 108 mg/dL — ABNORMAL HIGH (ref 70–99)
Glucose-Capillary: 147 mg/dL — ABNORMAL HIGH (ref 70–99)
Glucose-Capillary: 120 mg/dL — ABNORMAL HIGH (ref 70–99)
Glucose-Capillary: 115 mg/dL — ABNORMAL HIGH (ref 70–99)

## 2022-02-25 LAB — MAGNESIUM: Magnesium: 2.2 mg/dL (ref 1.7–2.4)

## 2022-02-25 MED ORDER — FUROSEMIDE 10 MG/ML IJ SOLN
20.0000 mg | Freq: Once | INTRAMUSCULAR | Status: AC
  Administered 2022-02-25: 20 mg via INTRAVENOUS
  Filled 2022-02-25: qty 2

## 2022-02-25 MED ORDER — ONDANSETRON HCL 4 MG/2ML IJ SOLN: 4.0000 mg | Freq: Four times a day (QID) | INTRAMUSCULAR | Status: DC | PRN

## 2022-02-25 NOTE — Progress Notes (Signed)
PROGRESS NOTE    DOLORES MCGOVERN  JME:[id number] DOB: 1945-12-30 DOA: 02/20/2022 PCP: Lavone Orn, MD   Brief Narrative:  76 year old M with PMH of dementia, CAD/CABG, DM-2, HTN, OSA on CPAP, recent hospitalization for left hip fracture after mechanical fall for which he underwent closed reduction with percutaneous pin fixation on 02/12/22 and discharged to SNF on 02/17/22 presented with shortness of breath, fever, poor oral intake, dysphagia, lethargy and hypoxia to 80s and admitted for severe sepsis/respiratory failure/right lower lobe pneumonia concerning for aspiration pneumonia and started on IV antibiotics.  Palliative care consulted for goals of care discussion.  Assessment & Plan:   Severe sepsis: Present on admission Possible aspiration pneumonia Acute respiratory failure with hypoxia -Does not wear supplemental oxygen at home. Still requiring 7 L oxygen via nasal cannula.  Wean off as able. -Continue Unasyn. -Blood and urine cultures negative so far. -Aspiration precautions.  Diet as per SLP recommendations. -Will repeat chest x-ray today  Goals of care Physical deconditioning Decreased oral intake/moderate malnutrition -Palliative care evaluated the patient during this hospitalization.  Patient remains full code.  Overall prognosis is guarded to poor.  If condition does not improve, recommend hospice/comfort measures - will need to return back to SNF once medically stable  Dysphagia/odynophagia -Continue diet as per SLP recommendations.  Aspiration precaution.  Trial of Magic mouthwash with lidocaine  Dementia without behavioral disturbance -As per patient's wife, patient's cognition has been declining lately. -Fall/delirium precautions.  Avoid or minimize sedating medications  Recent closed left hip fracture -Status post surgical intervention on 02/12/2022.  Outpatient follow-up with orthopedics.  Weightbearing as tolerated.  Patient will need SNF placement after  discharge.  Continue PT eval -Continue Lovenox for DVT prophylaxis while inpatient and switch to full dose aspirin on discharge  Leukocytosis -Worsening.  Monitor  Hypernatremia -Possibly from poor intake and dehydration.  Continue IV fluids with dextrose.  Repeat a.m. labs  Elevated LFTs -Mildly elevated LFTs, improving.  Questionable cause.  Monitor.  Rheumatoid arthritis -Methotrexate on hold  Benign essential hypertension -Blood pressure stable.  Continue metoprolol.  Lasix on hold  CAD status post CABG in 2019 -No chest pain.  Outpatient follow-up with cardiology.  Continue metoprolol  Depression -Continue fluoxetine  Diabetes mellitus type 2 -A1c 5.1.  Jardiance, metformin on hold continue CBGs with SSI  Hyperlipidemia -Crestor on hold because of elevated LFTs  OSA on CPAP -Continue nightly CPAP  Hypokalemia -Continue replacement with IV fluids   DVT prophylaxis: lovenox Code Status:  Full code Family Communication: wife at bedside Disposition Plan: Status is: Inpatient Remains inpatient appropriate because: of severity of illness    Consultants: Palliative care  Procedures: None  Antimicrobials:  Anti-infectives (From admission, onward)    Start     Dose/Rate Route Frequency Ordered Stop   02/22/22 0800  Ampicillin-Sulbactam (UNASYN) 3 g in sodium chloride 0.9 % 100 mL IVPB        3 g 200 mL/hr over 30 Minutes Intravenous Every 6 hours 02/22/22 0707 02/27/22 0759   02/21/22 1800  ceFEPIme (MAXIPIME) 2 g in sodium chloride 0.9 % 100 mL IVPB  Status:  Discontinued        2 g 200 mL/hr over 30 Minutes Intravenous Every 8 hours 02/21/22 1210 02/22/22 0707   02/21/22 1000  ceFEPIme (MAXIPIME) 2 g in sodium chloride 0.9 % 100 mL IVPB  Status:  Discontinued        2 g 200 mL/hr over 30 Minutes Intravenous Every 12 hours 02/20/22  1329 02/20/22 1746   02/21/22 1000  metroNIDAZOLE (FLAGYL) IVPB 500 mg  Status:  Discontinued        500 mg 100 mL/hr over 60  Minutes Intravenous Every 12 hours 02/21/22 0751 02/22/22 0707   02/20/22 1900  ceFEPIme (MAXIPIME) 2 g in sodium chloride 0.9 % 100 mL IVPB  Status:  Discontinued        2 g 200 mL/hr over 30 Minutes Intravenous Every 12 hours 02/20/22 1746 02/21/22 1210   02/20/22 1345  vancomycin (VANCOREADY) IVPB 1500 mg/300 mL        1,500 mg 150 mL/hr over 120 Minutes Intravenous STAT 02/20/22 1336 02/20/22 1644   02/20/22 1030  cefTRIAXone (ROCEPHIN) 2 g in sodium chloride 0.9 % 100 mL IVPB  Status:  Discontinued        2 g 200 mL/hr over 30 Minutes Intravenous Every 24 hours 02/20/22 1023 02/20/22 1329   02/20/22 1030  azithromycin (ZITHROMAX) 500 mg in sodium chloride 0.9 % 250 mL IVPB  Status:  Discontinued        500 mg 250 mL/hr over 60 Minutes Intravenous Every 24 hours 02/20/22 1023 02/20/22 1329        Subjective: Patient seen and examined at bedside.  Wife at bedside.  Nursing staff reports increasing lethargy overnight.  No overnight seizures, vomiting, fever reported.  Oral intake has remained poor. Objective: Vitals:   02/24/22 2000 02/24/22 2253 02/25/22 0200 02/25/22 0456  BP: 115/85  120/72 (P) 123/76  Pulse:    (P) 99  Resp: (!) 26 (!) 22 (!) 27   Temp: 99.2 F (37.3 C)   (P) 98.5 F (36.9 C)  TempSrc: Oral   (P) Oral  SpO2: (!) 89% 93%  (P) 91%  Weight:      Height:        Intake/Output Summary (Last 24 hours) at 02/25/2022 0759 Last data filed at 02/25/2022 0300 Gross per 24 hour  Intake 2954.99 ml  Output 100 ml  Net 2854.99 ml    Filed Weights   02/20/22 1530  Weight: 69.2 kg    Examination:  General: Currently on 7 L oxygen via nasal cannula.  No distress.  Looks chronically ill and deconditioned. ENT/neck: No thyromegaly.  JVD is not elevated  respiratory: Decreased breath sounds at bases bilaterally with some crackles; no wheezing.  Intermittently tachypneic CVS: Currently rate controlled, S1-S2 heard Abdominal: Soft, nontender, slightly distended; no  organomegaly, bowel sounds are heard Extremities: Mild lower extremity edema; no cyanosis  CNS: Wakes up slightly, still very slow to respond and confused.  No focal neurologic deficit.  Moves extremities Lymph: No obvious lymphadenopathy Skin: No obvious ecchymosis/lesions  psych: Could not be assessed because of mental status.  Currently not agitated.   Musculoskeletal: No obvious joint swelling/deformity     Data Reviewed: I have personally reviewed following labs and imaging studies  CBC: Recent Labs  Lab 02/20/22 1017 02/21/22 0509 02/22/22 0447 02/23/22 0509 02/24/22 0506 02/25/22 0559  WBC 13.3* 12.9* 11.7* 16.6* 25.5* 26.5*  NEUTROABS 11.9*  --   --   --   --  PENDING  HGB 14.4 13.6 15.1 12.0* 12.1* 12.2*  HCT 40.9 41.6 45.0 35.8* 36.0* 35.8*  MCV 99.0 102.0* 98.9 100.3* 101.4* 99.7  PLT 338 361 302 325 260 449    Basic Metabolic Panel: Recent Labs  Lab 02/20/22 1540 02/21/22 0509 02/22/22 0447 02/23/22 0509 02/24/22 0506 02/25/22 0559  NA  --  144 140 143 144  147*  K  --  3.8 3.3* 3.2* 3.5 3.3*  CL  --  111 109 111 113* 116*  CO2  --  19* '22 25 23 23  '$ GLUCOSE  --  88 81 107* 128* 110*  BUN  --  '23 23 18 16 16  '$ CREATININE  --  0.83 0.78 0.84 0.81 0.91  CALCIUM  --  9.5 9.0 8.8* 8.8* 9.0  MG 2.3  --  2.1 2.1 1.9 2.2  PHOS  --   --  1.6*  --   --   --     GFR: Estimated Creatinine Clearance: 67.6 mL/min (by C-G formula based on SCr of 0.91 mg/dL). Liver Function Tests: Recent Labs  Lab 02/21/22 0509 02/22/22 0447 02/23/22 0509 02/24/22 0506 02/25/22 0559  AST 60* 143* 130* 103* 82*  ALT 31 61* 66* 59* 51*  ALKPHOS 92 92 101 98 113  BILITOT 1.4* 1.1 1.0 1.1 1.4*  PROT 6.7 6.0* 6.0* 5.8* 6.2*  ALBUMIN 2.7* 2.2* 2.3* 2.1* 2.2*    No results for input(s): LIPASE, AMYLASE in the last 168 hours. No results for input(s): AMMONIA in the last 168 hours. Coagulation Profile: Recent Labs  Lab 02/20/22 1017 02/21/22 0509  INR 1.4* 1.4*     Cardiac Enzymes: Recent Labs  Lab 02/22/22 0447  CKTOTAL 119    BNP (last 3 results) No results for input(s): PROBNP in the last 8760 hours. HbA1C: No results for input(s): HGBA1C in the last 72 hours. CBG: Recent Labs  Lab 02/23/22 1956 02/24/22 0717 02/24/22 1159 02/24/22 1640 02/24/22 2006  GLUCAP 122* 134* 194* 141* 118*    Lipid Profile: No results for input(s): CHOL, HDL, LDLCALC, TRIG, CHOLHDL, LDLDIRECT in the last 72 hours. Thyroid Function Tests: No results for input(s): TSH, T4TOTAL, FREET4, T3FREE, THYROIDAB in the last 72 hours. Anemia Panel: No results for input(s): VITAMINB12, FOLATE, FERRITIN, TIBC, IRON, RETICCTPCT in the last 72 hours.  Sepsis Labs: Recent Labs  Lab 02/20/22 1017 02/20/22 1310 02/21/22 0509 02/22/22 0447 02/23/22 0509  PROCALCITON  --   --  7.90 7.22 4.14  LATICACIDVEN 2.2* 1.6  --   --   --      Recent Results (from the past 240 hour(s))  Blood Culture (routine x 2)     Status: None   Collection Time: 02/20/22 10:27 AM   Specimen: BLOOD  Result Value Ref Range Status   Specimen Description   Final    BLOOD RIGHT ANTECUBITAL Performed at [facility], [place] [address], [place], [place] [address]    Special Requests   Final    BOTTLES DRAWN AEROBIC AND ANAEROBIC Blood Culture results may not be optimal due to an excessive volume of blood received in culture bottles Performed at [place] [address], [place], [place] [address]    Culture   Final    NO GROWTH 5 DAYS Performed at [facility], [place] [address], [place], [place] [address]    Report Status 02/25/2022 FINAL  Final  Resp Panel by RT-PCR (Flu A&B, Covid) Nasopharyngeal Swab     Status: None   Collection Time: 02/20/22 10:31 AM   Specimen: Nasopharyngeal Swab; Nasopharyngeal(NP) swabs in vial transport medium  Result Value Ref Range Status   SARS Coronavirus 2 by RT PCR NEGATIVE NEGATIVE Final    Comment:  (NOTE) SARS-CoV-2 target nucleic acids are NOT DETECTED.  The SARS-CoV-2 RNA is generally detectable in upper respiratory specimens during the  acute phase of infection. The lowest concentration of SARS-CoV-2 viral copies this assay can detect is 138 copies/mL. A negative result does not preclude SARS-Cov-2 infection and should not be used as the sole basis for treatment or other patient management decisions. A negative result may occur with  improper specimen collection/handling, submission of specimen other than nasopharyngeal swab, presence of viral mutation(s) within the areas targeted by this assay, and inadequate number of viral copies(<138 copies/mL). A negative result must be combined with clinical observations, patient history, and epidemiological information. The expected result is Negative.  Fact Sheet for Patients:  EntrepreneurPulse.com.au  Fact Sheet for Healthcare Providers:  IncredibleEmployment.be  This test is no t yet approved or cleared by the [place] FDA and  has been authorized for detection and/or diagnosis of SARS-CoV-2 by FDA under an Emergency Use Authorization (EUA). This EUA will remain  in effect (meaning this test can be used) for the duration of the COVID-19 declaration under Section 564(b)(1) of the Act, 21 U.S.C.section 360bbb-3(b)(1), unless the authorization is terminated  or revoked sooner.       Influenza A by PCR NEGATIVE NEGATIVE Final   Influenza B by PCR NEGATIVE NEGATIVE Final    Comment: (NOTE) The Xpert Xpress SARS-CoV-2/FLU/RSV plus assay is intended as an aid in the diagnosis of influenza from Nasopharyngeal swab specimens and should not be used as a sole basis for treatment. Nasal washings and aspirates are unacceptable for Xpert Xpress SARS-CoV-2/FLU/RSV testing.  Fact Sheet for Patients: EntrepreneurPulse.com.au  Fact Sheet for Healthcare  Providers: IncredibleEmployment.be  This test is not yet approved or cleared by the [place] FDA and has been authorized for detection and/or diagnosis of SARS-CoV-2 by FDA under an Emergency Use Authorization (EUA). This EUA will remain in effect (meaning this test can be used) for the duration of the COVID-19 declaration under Section 564(b)(1) of the Act, 21 U.S.C. section 360bbb-3(b)(1), unless the authorization is terminated or revoked.  Performed at [facility], [place] [address] [place], [place] [address]   Blood Culture (routine x 2)     Status: None   Collection Time: 02/20/22 10:40 AM   Specimen: BLOOD  Result Value Ref Range Status   Specimen Description   Final    BLOOD LEFT ANTECUBITAL Performed at [place] [address], [place], [place] [address]    Special Requests   Final    BOTTLES DRAWN AEROBIC AND ANAEROBIC Blood Culture adequate volume Performed at [place] [address] [place], [place] [address]    Culture   Final    NO GROWTH 5 DAYS Performed at [facility], [place] [address], [place], [place] [address]    Report Status 02/25/2022 FINAL  Final  MRSA Next Gen by PCR, Nasal     Status: None   Collection Time: 02/20/22  1:09 PM   Specimen: Nasal Mucosa; Nasal Swab  Result Value Ref Range Status   MRSA by PCR Next Gen NOT DETECTED NOT DETECTED Final    Comment: (NOTE) The GeneXpert MRSA Assay (FDA approved for NASAL specimens only), is one component of a comprehensive MRSA colonization surveillance program. It is not intended to diagnose MRSA infection nor to guide or monitor treatment for MRSA infections. Test performance is not FDA approved in patients less than 30 years old. Performed at [facility], [place] [address], [place], [place] [address]   Urine Culture     Status: None   Collection  Time: 02/20/22  2:46 PM   Specimen: In/Out  Cath Urine  Result Value Ref Range Status   Specimen Description   Final    IN/OUT CATH URINE Performed at [facility], [place] [address], [place], [place] [address]    Special Requests   Final    NONE Performed at [facility], [place] [address], [place], [place] [address]    Culture   Final    NO GROWTH Performed at [facility], [place] [address], [place], [place] [address]    Report Status 02/22/2022 FINAL  Final          Radiology Studies: No results found.      Scheduled Meds:  docusate sodium  100 mg Oral BID   enoxaparin (LOVENOX) injection  40 mg Subcutaneous Q24H   feeding supplement  237 mL Oral TID BM   FLUoxetine  20 mg Oral q morning   folic acid-pyridoxine-cyancobalamin  1 tablet Oral Daily   guaiFENesin  600 mg Oral BID   insulin aspart  0-9 Units Subcutaneous TID WC   magic mouthwash w/lidocaine  5 mL Oral TID   metoprolol tartrate  12.5 mg Oral BID   polyethylene glycol  17 g Oral Daily   Continuous Infusions:  ampicillin-sulbactam (UNASYN) IV 3 g (02/25/22 0202)   dextrose 5 % lactated ringers with kcl 50 mL/hr at 02/25/22 0724          Aline August, MD [facility] 02/25/2022, 7:59 AM

## 2022-02-25 NOTE — Progress Notes (Signed)
And under                                                                                                                                                                                               Palliative Care Progress Note, Assessment & Plan   Patient Name: Derek [name]       Date: 02/25/2022 DOB: August 20, 1946  Age: 76 y.o. MRN#: [id number] Attending Physician: Aline August, MD Primary Care Physician: Lavone Orn, MD Admit Date: 02/20/2022  Reason for Consultation/Follow-up: Establishing goals of care  Subjective: Pt is sitting in bed in no apparent distress.  He does not acknowledge my presence.  He is not able to make his wishes known.  When asked him where he was he says that he is in [place] at [facility] because he came by ambulance.  However, when asked other questions, he has the exact same response.  Wife and nephew are at bedside.  HPI: 76 y.o. male  with past medical history of dementia, CAD/CABG, DM2, HTN, OSA (CPAP) and left hip fracture with pin 02/12/22 (d/c 02/16/22 to SNF) admitted on 02/20/2022 with [facility] PNA. Pt was having SOB, fever, and poor PO intake at home. He is receiving IV antibiotics and attempting to ween oxygen from 6-9L [place] (no O2 use at home). He has increasing smonlence and decreased ability to participate in PT/OT therapies.       PMT was consulted to discuss code status and review GOC.  Summary of counseling/coordination of care: After reviewing the patient's chart and assessing the patient at bedside, I again spoke with patient's wife and in regards to prognosis and CODE STATUS.  And shares that she would not want drastic measures in order to prolong patient's life.  She also shares that if his time comes in the [place] wants to take him then she would want to allow that natural process.  I pointed out that these are contradictory statements and that we need further discussions to clarify patient's goals and CODE STATUS.  And shares she is overwhelmed  since she has seen such a rapid decline over the last several weeks.  She says she knows he was not doing well and sleeping more frequently over the last 2 years but that him seeming to "give up the fight" it is hard for her to see.  Therapeutic silence and active listening provided for her to share her thoughts and emotions regarding her husband's current health status.  I attempted to elicit goals important to the patient.  And shares that the patient never made his wishes known but that she would think  he would never want to live on a machine long-term.  I outlined that if patient was to have respiratory distress and to be placed on a ventilator how long would she be in agreement with keeping him on life support.  She shares that it would not be long-term.  She stated that she would want everything done to preserve his life and give him the best shot with all medically available options.  However, she states that if he were to be on a ventilator then she would not want him to live for "very long" since she knows he would not want to live as a vegetable.  Ongoing discussions and clarification needed.  [name] continues to remain hopeful that patient will recover but also shares she is realistic about him not improving.  I again outlined patient's physical, functional, nutritional, and cognitive decline.  Again highlighted that dementia is a progressive and nonreversible illness.  Coupled with his pneumonia the patient is not improving as hoped.  [name] remains hopeful and wants to continue to take it 1 day at a time.   Attending notified of our discussions.  Palliative medicine team will continue to follow the patient throughout his hospitalization.  Code Status: Full code  Prognosis: Unable to determine  Discharge Planning: To Be Determined  Care plan was discussed with patient, patient's wife, patient's nephew  Physical Exam Vitals reviewed.  Constitutional:      Comments: somnolent  HENT:      Head: Normocephalic and atraumatic.     Mouth/Throat:     Mouth: Mucous membranes are moist.  Cardiovascular:     Rate and Rhythm: Normal rate.     Pulses: Normal pulses.  Pulmonary:     Breath sounds: Rhonchi present.     Comments: Tullos in place Abdominal:     Palpations: Abdomen is soft.  Musculoskeletal:     Comments: Generalized weakness  Skin:    General: Skin is warm and dry.  Neurological:     Comments: Not interactive, responds to yes no questions but unable to verbalize more than a few words            Palliative Assessment/Data: 30%    Total Time 50 minutes  Greater than 50%  of this time was spent counseling and coordinating care related to the above assessment and plan.  Thank you for allowing the Palliative Medicine Team to assist in the care of this patient.  Olney Ilsa Iha, FNP-BC Palliative Medicine Team Team Phone # [phone]

## 2022-02-25 NOTE — Progress Notes (Signed)
PT Cancellation Note  Patient Details Name: Derek [name] MRN: [id number] DOB: 09-Jun-1946   Cancelled Treatment:     Pt unable to tolerate today, per chart review requiring increased supplemental oxygen and increased WBC.  Pt has been evaluated with rec for SNF.  Will continue to follow during his Acute Care stay.   Rica Koyanagi  PTA Acute  Rehabilitation Services Pager      [phone] Office      [phone]

## 2022-02-25 NOTE — Progress Notes (Signed)
Evening medications administered early as pt has history of progressive lethargy during nighttime. Pt lethargic and able to make simple 1-4 word conversation. Attempted to give medications crushed in applesauce but pt unable to swallow all of it and would pocket the majority. During mouth care and oral suctioning, parts of medication was suctioned.

## 2022-02-26 DIAGNOSIS — J9601 Acute respiratory failure with hypoxia: Secondary | ICD-10-CM | POA: Diagnosis not present

## 2022-02-26 DIAGNOSIS — J690 Pneumonitis due to inhalation of food and vomit: Secondary | ICD-10-CM | POA: Diagnosis not present

## 2022-02-26 DIAGNOSIS — R6520 Severe sepsis without septic shock: Secondary | ICD-10-CM | POA: Diagnosis not present

## 2022-02-26 DIAGNOSIS — A419 Sepsis, unspecified organism: Secondary | ICD-10-CM | POA: Diagnosis not present

## 2022-02-26 LAB — CBC WITH DIFFERENTIAL/PLATELET
Abs Immature Granulocytes: 0.71 10*3/uL — ABNORMAL HIGH (ref 0.00–0.07)
Basophils Absolute: 0.1 10*3/uL (ref 0.0–0.1)
Basophils Relative: 0 %
Eosinophils Absolute: 0.3 10*3/uL (ref 0.0–0.5)
Eosinophils Relative: 1 %
HCT: 36.1 % — ABNORMAL LOW (ref 39.0–52.0)
Hemoglobin: 12 g/dL — ABNORMAL LOW (ref 13.0–17.0)
Immature Granulocytes: 3 %
Lymphocytes Relative: 18 %
Lymphs Abs: 5.2 10*3/uL — ABNORMAL HIGH (ref 0.7–4.0)
MCH: 33.5 pg (ref 26.0–34.0)
MCHC: 33.2 g/dL (ref 30.0–36.0)
MCV: 100.8 fL — ABNORMAL HIGH (ref 80.0–100.0)
Monocytes Absolute: 2.3 10*3/uL — ABNORMAL HIGH (ref 0.1–1.0)
Monocytes Relative: 8 %
Neutro Abs: 19.7 10*3/uL — ABNORMAL HIGH (ref 1.7–7.7)
Neutrophils Relative %: 70 %
Platelets: 306 10*3/uL (ref 150–400)
RBC: 3.58 MIL/uL — ABNORMAL LOW (ref 4.22–5.81)
RDW: 14.7 % (ref 11.5–15.5)
WBC: 28.3 10*3/uL — ABNORMAL HIGH (ref 4.0–10.5)
nRBC: 0.1 % (ref 0.0–0.2)

## 2022-02-26 LAB — GLUCOSE, CAPILLARY
Glucose-Capillary: 100 mg/dL — ABNORMAL HIGH (ref 70–99)
Glucose-Capillary: 119 mg/dL — ABNORMAL HIGH (ref 70–99)
Glucose-Capillary: 103 mg/dL — ABNORMAL HIGH (ref 70–99)
Glucose-Capillary: 97 mg/dL (ref 70–99)

## 2022-02-26 LAB — COMPREHENSIVE METABOLIC PANEL
ALT: 44 U/L (ref 0–44)
AST: 75 U/L — ABNORMAL HIGH (ref 15–41)
Albumin: 2.2 g/dL — ABNORMAL LOW (ref 3.5–5.0)
Alkaline Phosphatase: 94 U/L (ref 38–126)
Anion gap: 7 (ref 5–15)
BUN: 17 mg/dL (ref 8–23)
CO2: 23 mmol/L (ref 22–32)
Calcium: 9.2 mg/dL (ref 8.9–10.3)
Chloride: 118 mmol/L — ABNORMAL HIGH (ref 98–111)
Creatinine, Ser: 0.9 mg/dL (ref 0.61–1.24)
GFR, Estimated: >60 mL/min (ref >60)
Glucose, Bld: 110 mg/dL — ABNORMAL HIGH (ref 70–99)
Potassium: 3.3 mmol/L — ABNORMAL LOW (ref 3.5–5.1)
Sodium: 148 mmol/L — ABNORMAL HIGH (ref 135–145)
Total Bilirubin: 1.1 mg/dL (ref 0.3–1.2)
Total Protein: 6.3 g/dL — ABNORMAL LOW (ref 6.5–8.1)

## 2022-02-26 LAB — AMMONIA: Ammonia: 33 umol/L (ref 9–35)

## 2022-02-26 LAB — VITAMIN B12: Vitamin B-12: 2018 pg/mL — ABNORMAL HIGH (ref 180–914)

## 2022-02-26 LAB — MAGNESIUM: Magnesium: 2.1 mg/dL (ref 1.7–2.4)

## 2022-02-26 LAB — TSH: TSH: 0.221 u[IU]/mL — ABNORMAL LOW (ref 0.350–4.500)

## 2022-02-26 MED ORDER — PIPERACILLIN-TAZOBACTAM 3.375 G IVPB
3.3750 g | Freq: Three times a day (TID) | INTRAVENOUS | Status: DC
  Administered 2022-02-26 – 2022-03-04 (×18): 3.375 g via INTRAVENOUS
  Filled 2022-02-26 (×15): qty 50

## 2022-02-26 MED ORDER — VANCOMYCIN HCL 1500 MG/300ML IV SOLN
1500.0000 mg | INTRAVENOUS | Status: DC
  Administered 2022-02-26 – 2022-02-28 (×3): 1500 mg via INTRAVENOUS
  Filled 2022-02-26 (×4): qty 300

## 2022-02-26 NOTE — Progress Notes (Signed)
Pt lethargic and able to follow simple commands. GCS 13. RN notified on-call provider and held evening medications due to lethargy and risk of aspiration.

## 2022-02-26 NOTE — Progress Notes (Signed)
Speech Language Pathology Treatment: Dysphagia  Patient Details Name: TEMPLE SPORER MRN: [id number] DOB: 02/02/1946 Today's Date: 02/26/2022 Time: 0938-1829 SLP Time Calculation (min) (ACUTE ONLY): 25 min  Assessment / Plan / Recommendation Clinical Impression  Pt demonstrates increased interest in drinking today, is able to lift cup and drink from cup edge with long consecutive sips. Though he doesn't have immediate coughing there are instances of decreased coordination. Attempted  mastication with a ritz cracker. Pt fed himself the cracker, and had an initial instance of munching mastication, but lost attention and orally held dry pieces of cracker without bolus formation despite cueing. Gave pt a liquid wash resulting in immediate coughing. Pt has not progressed with attention and swallowing enough to advance. In fact, given pts increased interest and impulsivity, recommend nectar thickened drinks to increase pts safety with PO over the weekend. Pt may still have ensure unthickened and also ok to be fed broth soups with a spoon. Will f/u for tolerance.  HPI HPI: Pt is a 76 y.o. male who presented with dyspnea and fever. CXR 5/14: New airspace disease in the medial right base compatible with atelectasis or pneumonia. Per RN note 5/14, pt given coffee and "he began coughing and spit it up along with small amount of thick mucus." PMH: dementia, CAD s/p CABG, DM2, HTN, OSA on CPAP, mild cognitive impairment.      SLP Plan  Continue with current plan of care      Recommendations for follow up therapy are one component of a multi-disciplinary discharge planning process, led by the attending physician.  Recommendations may be updated based on patient status, additional functional criteria and insurance authorization.    Recommendations  Diet recommendations: Nectar-thick liquid Liquids provided via: Cup;Straw Medication Administration: Whole meds with puree Supervision: Full  supervision/cueing for compensatory strategies Compensations: Slow rate;Small sips/bites;Minimize environmental distractions;Other (Comment) Postural Changes and/or Swallow Maneuvers: Seated upright 90 degrees;Upright 30-60 min after meal                Plan: Continue with current plan of care           Kenecia Barren, Katherene Ponto  02/26/2022, 1:47 PM

## 2022-02-26 NOTE — TOC Progression Note (Signed)
Transition of Care [facility]) - Progression Note    Patient Details  Name: Derek [name] MRN: [id number] Date of Birth: 12/18/1945  [facility] of [facility] CM/SW Contact  Morgan Keinath, Juliann Pulse, RN Phone Number: 02/26/2022, 1:55 PM  Clinical Narrative: From [facility]-noted on 02. DNR. Continue to follow.      Expected Discharge Plan: [place] (from [facility]) Barriers to Discharge: Continued Medical Work up  Expected Discharge Plan and Services Expected Discharge Plan: [place] (from [facility])   Discharge Planning Services: CM Consult Post Acute Care Choice: [place] Living arrangements for the past 2 months: Single Family Home                                       Social Determinants of Health (SDOH) Interventions    Readmission Risk Interventions     View : No data to display.

## 2022-02-26 NOTE — TOC Progression Note (Signed)
Transition of Care [facility]) - Progression Note    Patient Details  Name: Derek [name] MRN: [id number] Date of Birth: [birth year]  [facility] CM/SW Contact  Leeroy Cha, RN Phone Number: 02/26/2022, 8:41 AM  Clinical Narrative:    Following for toc needs and snf placement.   Expected Discharge Plan: [place] (from [facility]) Barriers to Discharge: No Barriers Identified  Expected Discharge Plan and Services Expected Discharge Plan: [place] (from [facility])   Discharge Planning Services: CM Consult Post Acute Care Choice: [place] Living arrangements for the past 2 months: Single Family Home                                       Social Determinants of Health (SDOH) Interventions    Readmission Risk Interventions     View : No data to display.

## 2022-02-26 NOTE — Progress Notes (Signed)
Added sterile water to high flow nasal cannula system.

## 2022-02-26 NOTE — Progress Notes (Signed)
PROGRESS NOTE    Derek [name]  WVP:[id number] DOB: January 22, 1946 DOA: 02/20/2022 PCP: Lavone Orn, MD   Brief Narrative:  76 year old M with PMH of dementia, CAD/CABG, DM-2, HTN, OSA on CPAP, recent hospitalization for left hip fracture after mechanical fall for which he underwent closed reduction with percutaneous pin fixation on 02/12/22 and discharged to SNF on 02/17/22 presented with shortness of breath, fever, poor oral intake, dysphagia, lethargy and hypoxia to 80s and admitted for severe sepsis/respiratory failure/right lower lobe pneumonia concerning for aspiration pneumonia and started on IV antibiotics.  Palliative care consulted for goals of care discussion.  Assessment & Plan:   Severe sepsis: Present on admission Possible aspiration pneumonia Acute respiratory failure with hypoxia -Does not wear supplemental oxygen at home. Still requiring 7 L oxygen via nasal cannula.  Wean off as able. -Currently on Unasyn.  Will switch to vancomycin and Zosyn considering worsening leukocytosis and chest x-ray findings.   -Blood and urine cultures negative so far. -Aspiration precautions.  Diet as per SLP recommendations. -Chest x-ray on 02/25/2022 showed worsening bilateral perihilar and right basilar opacities  Goals of care Physical deconditioning Decreased oral intake/moderate malnutrition -Palliative care evaluated the patient during this hospitalization.  Patient remains full code.  Overall prognosis is guarded to poor.   -Discussed with wife at bedside today regarding worsening condition of her husband and that his overall prognosis is poor.  She is agreeable to change his CODE STATUS to DNR.  Also agreeable to try switching antibiotics as above for another 24 to 48 hours to see if there is any improvement.  If not, she is thinking about hospice/comfort measures.  Dysphagia/odynophagia -Continue diet as per SLP recommendations.  Aspiration precaution.    Dementia without  behavioral disturbance -As per patient's wife, patient's cognition has been declining lately. -Fall/delirium precautions.  Avoid or minimize sedating medications  Recent closed left hip fracture -Status post surgical intervention on 02/12/2022.  Outpatient follow-up with orthopedics.  Weightbearing as tolerated.  Patient will need SNF placement after discharge.  Continue PT eval -Continue Lovenox for DVT prophylaxis while inpatient and switch to full dose aspirin on discharge  Leukocytosis -Worsening.  Antibiotics plan as above.  We will hold off on further investigations including blood cultures/CAT scan.  Hypernatremia -Possibly from poor intake and dehydration.  Continue IV fluids with dextrose.  Repeat a.m. labs  Elevated LFTs -Mildly elevated LFTs, improving.  Questionable cause.  Monitor.  Rheumatoid arthritis -Methotrexate on hold  Benign essential hypertension -Blood pressure stable.  Continue metoprolol.  Lasix on hold  CAD status post CABG in 2019 -No chest pain.  Outpatient follow-up with cardiology.  Continue metoprolol  Depression -Continue fluoxetine  Diabetes mellitus type 2 -A1c 5.1.  Jardiance, metformin on hold continue CBGs with SSI  Hyperlipidemia -Crestor on hold because of elevated LFTs  OSA on CPAP -Continue nightly CPAP  Hypokalemia -Continue replacement with IV fluids   DVT prophylaxis: lovenox Code Status:  Full code Family Communication: wife at bedside Disposition Plan: Status is: Inpatient Remains inpatient appropriate because: of severity of illness    Consultants: Palliative care  Procedures: None  Antimicrobials:  Anti-infectives (From admission, onward)    Start     Dose/Rate Route Frequency Ordered Stop   02/22/22 0800  Ampicillin-Sulbactam (UNASYN) 3 g in sodium chloride 0.9 % 100 mL IVPB        3 g 200 mL/hr over 30 Minutes Intravenous Every 6 hours 02/22/22 0707 02/27/22 0759   02/21/22 1800  ceFEPIme (  MAXIPIME) 2 g in  sodium chloride 0.9 % 100 mL IVPB  Status:  Discontinued        2 g 200 mL/hr over 30 Minutes Intravenous Every 8 hours 02/21/22 1210 02/22/22 0707   02/21/22 1000  ceFEPIme (MAXIPIME) 2 g in sodium chloride 0.9 % 100 mL IVPB  Status:  Discontinued        2 g 200 mL/hr over 30 Minutes Intravenous Every 12 hours 02/20/22 1329 02/20/22 1746   02/21/22 1000  metroNIDAZOLE (FLAGYL) IVPB 500 mg  Status:  Discontinued        500 mg 100 mL/hr over 60 Minutes Intravenous Every 12 hours 02/21/22 0751 02/22/22 0707   02/20/22 1900  ceFEPIme (MAXIPIME) 2 g in sodium chloride 0.9 % 100 mL IVPB  Status:  Discontinued        2 g 200 mL/hr over 30 Minutes Intravenous Every 12 hours 02/20/22 1746 02/21/22 1210   02/20/22 1345  vancomycin (VANCOREADY) IVPB 1500 mg/300 mL        1,500 mg 150 mL/hr over 120 Minutes Intravenous STAT 02/20/22 1336 02/20/22 1644   02/20/22 1030  cefTRIAXone (ROCEPHIN) 2 g in sodium chloride 0.9 % 100 mL IVPB  Status:  Discontinued        2 g 200 mL/hr over 30 Minutes Intravenous Every 24 hours 02/20/22 1023 02/20/22 1329   02/20/22 1030  azithromycin (ZITHROMAX) 500 mg in sodium chloride 0.9 % 250 mL IVPB  Status:  Discontinued        500 mg 250 mL/hr over 60 Minutes Intravenous Every 24 hours 02/20/22 1023 02/20/22 1329        Subjective: Patient seen and examined at bedside.  Wife at bedside.  No fever, seizures, vomiting reported.  Oral intake continues to remain poor.  Increasingly lethargic overnight as per nursing staff.   Objective: Vitals:   02/25/22 2045 02/26/22 0300 02/26/22 0400 02/26/22 0536  BP: 116/68  125/71   Pulse: (!) 102     Resp:  20 (!) 22   Temp: 99.7 F (37.6 C)   98.2 F (36.8 C)  TempSrc: Axillary   Axillary  SpO2: 91%     Weight:      Height:        Intake/Output Summary (Last 24 hours) at 02/26/2022 0823 Last data filed at 02/26/2022 0535 Gross per 24 hour  Intake 1597.4 ml  Output 1850 ml  Net -252.6 ml    Filed Weights    02/20/22 1530  Weight: 69.2 kg    Examination:  General: Still on 7 L oxygen via nasal cannula.  No distress.  Chronically ill and deconditioned looking elderly gentleman lying in bed.   ENT/neck: No elevated JVD.  No obvious masses  respiratory: Bilateral decreased breath sounds at bases with some scattered crackles; intermittently tachypneic CVS: Intermittently tachycardic; S1 and S2 are heard  abdominal: Soft, nontender, distended slightly; no organomegaly, bowel sounds are heard Extremities: Mild lower extremity edema present; no clubbing  CNS: Wakes up slightly, still very slow to respond and confused.  No focal neurologic deficit.  Moving extremities. Lymph: No palpable cervical lymphadenopathy Skin: No obvious ecchymosis/lesions/petechiae  psych: Extremely flat affect.  Not agitated currently  musculoskeletal: No obvious joint deformity/tenderness/swelling      Data Reviewed: I have personally reviewed following labs and imaging studies  CBC: Recent Labs  Lab 02/20/22 1017 02/21/22 0509 02/22/22 0447 02/23/22 0509 02/24/22 0506 02/25/22 0559 02/26/22 0458  WBC 13.3*   < >  11.7* 16.6* 25.5* 26.5* 28.3*  NEUTROABS 11.9*  --   --   --   --  20.4* PENDING  HGB 14.4   < > 15.1 12.0* 12.1* 12.2* 12.0*  HCT 40.9   < > 45.0 35.8* 36.0* 35.8* 36.1*  MCV 99.0   < > 98.9 100.3* 101.4* 99.7 100.8*  PLT 338   < > 302 325 260 283 306   < > = values in this interval not displayed.    Basic Metabolic Panel: Recent Labs  Lab 02/22/22 0447 02/23/22 0509 02/24/22 0506 02/25/22 0559 02/26/22 0458  NA 140 143 144 147* 148*  K 3.3* 3.2* 3.5 3.3* 3.3*  CL 109 111 113* 116* 118*  CO2 '22 25 23 23 23  '$ GLUCOSE 81 107* 128* 110* 110*  BUN '23 18 16 16 17  '$ CREATININE 0.78 0.84 0.81 0.91 0.90  CALCIUM 9.0 8.8* 8.8* 9.0 9.2  MG 2.1 2.1 1.9 2.2 2.1  PHOS 1.6*  --   --   --   --     GFR: Estimated Creatinine Clearance: 68.3 mL/min (by C-G formula based on SCr of 0.9  mg/dL). Liver Function Tests: Recent Labs  Lab 02/22/22 0447 02/23/22 0509 02/24/22 0506 02/25/22 0559 02/26/22 0458  AST 143* 130* 103* 82* 75*  ALT 61* 66* 59* 51* 44  ALKPHOS 92 101 98 113 94  BILITOT 1.1 1.0 1.1 1.4* 1.1  PROT 6.0* 6.0* 5.8* 6.2* 6.3*  ALBUMIN 2.2* 2.3* 2.1* 2.2* 2.2*    No results for input(s): LIPASE, AMYLASE in the last 168 hours. Recent Labs  Lab 02/26/22 0458  AMMONIA 33   Coagulation Profile: Recent Labs  Lab 02/20/22 1017 02/21/22 0509  INR 1.4* 1.4*    Cardiac Enzymes: Recent Labs  Lab 02/22/22 0447  CKTOTAL 119    BNP (last 3 results) No results for input(s): PROBNP in the last 8760 hours. HbA1C: No results for input(s): HGBA1C in the last 72 hours. CBG: Recent Labs  Lab 02/25/22 0812 02/25/22 1130 02/25/22 1706 02/25/22 2043 02/26/22 0740  GLUCAP 108* 147* 120* 115* 100*    Lipid Profile: No results for input(s): CHOL, HDL, LDLCALC, TRIG, CHOLHDL, LDLDIRECT in the last 72 hours. Thyroid Function Tests: Recent Labs    02/26/22 0458  TSH 0.221*   Anemia Panel: Recent Labs    02/26/22 0458  VITAMINB12 2,018*    Sepsis Labs: Recent Labs  Lab 02/20/22 1017 02/20/22 1310 02/21/22 0509 02/22/22 0447 02/23/22 0509  PROCALCITON  --   --  7.90 7.22 4.14  LATICACIDVEN 2.2* 1.6  --   --   --      Recent Results (from the past 240 hour(s))  Blood Culture (routine x 2)     Status: None   Collection Time: 02/20/22 10:27 AM   Specimen: BLOOD  Result Value Ref Range Status   Specimen Description   Final    BLOOD RIGHT ANTECUBITAL Performed at [facility] [place] [address] [place], [place] [address]    Special Requests   Final    BOTTLES DRAWN AEROBIC AND ANAEROBIC Blood Culture results may not be optimal due to an excessive volume of blood received in culture bottles Performed at [facility] [address] [place], [place] [address]    Culture   Final    NO GROWTH 5  DAYS Performed at [facility], [place] [address] [place], [place] [address]    Report Status 02/25/2022 FINAL  Final  Resp Panel by RT-PCR (Flu A&B, Covid) Nasopharyngeal Swab     Status: None   Collection Time: 02/20/22 10:31 AM   Specimen: Nasopharyngeal Swab; Nasopharyngeal(NP) swabs in vial transport medium  Result Value Ref Range Status   SARS Coronavirus 2 by RT PCR NEGATIVE NEGATIVE Final    Comment: (NOTE) SARS-CoV-2 target nucleic acids are NOT DETECTED.  The SARS-CoV-2 RNA is generally detectable in upper respiratory specimens during the acute phase of infection. The lowest concentration of SARS-CoV-2 viral copies this assay can detect is 138 copies/mL. A negative result does not preclude SARS-Cov-2 infection and should not be used as the sole basis for treatment or other patient management decisions. A negative result may occur with  improper specimen collection/handling, submission of specimen other than nasopharyngeal swab, presence of viral mutation(s) within the areas targeted by this assay, and inadequate number of viral copies(<138 copies/mL). A negative result must be combined with clinical observations, patient history, and epidemiological information. The expected result is Negative.  Fact Sheet for Patients:  EntrepreneurPulse.com.au  Fact Sheet for Healthcare Providers:  IncredibleEmployment.be  This test is no t yet approved or cleared by the [place] FDA and  has been authorized for detection and/or diagnosis of SARS-CoV-2 by FDA under an Emergency Use Authorization (EUA). This EUA will remain  in effect (meaning this test can be used) for the duration of the COVID-19 declaration under Section 564(b)(1) of the Act, 21 U.S.C.section 360bbb-3(b)(1), unless the authorization is terminated  or revoked sooner.       Influenza A by PCR NEGATIVE NEGATIVE Final   Influenza B by PCR NEGATIVE NEGATIVE Final     Comment: (NOTE) The Xpert Xpress SARS-CoV-2/FLU/RSV plus assay is intended as an aid in the diagnosis of influenza from Nasopharyngeal swab specimens and should not be used as a sole basis for treatment. Nasal washings and aspirates are unacceptable for Xpert Xpress SARS-CoV-2/FLU/RSV testing.  Fact Sheet for Patients: EntrepreneurPulse.com.au  Fact Sheet for Healthcare Providers: IncredibleEmployment.be  This test is not yet approved or cleared by the [place] FDA and has been authorized for detection and/or diagnosis of SARS-CoV-2 by FDA under an Emergency Use Authorization (EUA). This EUA will remain in effect (meaning this test can be used) for the duration of the COVID-19 declaration under Section 564(b)(1) of the Act, 21 U.S.C. section 360bbb-3(b)(1), unless the authorization is terminated or revoked.  Performed at [facility], [place] [address], [place], [place] [address]   Blood Culture (routine x 2)     Status: None   Collection Time: 02/20/22 10:40 AM   Specimen: BLOOD  Result Value Ref Range Status   Specimen Description   Final    BLOOD LEFT ANTECUBITAL Performed at [facility] [address], [place], [place] [address]    Special Requests   Final    BOTTLES DRAWN AEROBIC AND ANAEROBIC Blood Culture adequate volume Performed at [facility] [address], [place], [place] [address]    Culture   Final    NO GROWTH 5 DAYS Performed at [facility], [place] [address], [place], [place] [address]    Report Status 02/25/2022 FINAL  Final  MRSA Next Gen by PCR, Nasal     Status: None   Collection Time: 02/20/22  1:09 PM   Specimen: Nasal Mucosa; Nasal Swab  Result Value Ref Range Status   MRSA by PCR Next Gen NOT DETECTED NOT DETECTED Final    Comment: (NOTE) The  GeneXpert MRSA Assay (FDA approved for NASAL specimens only), is one component of a comprehensive MRSA  colonization surveillance program. It is not intended to diagnose MRSA infection nor to guide or monitor treatment for MRSA infections. Test performance is not FDA approved in patients less than 53 years old. Performed at [facility], [place] [address], [place], [place] [address]   Urine Culture     Status: None   Collection Time: 02/20/22  2:46 PM   Specimen: In/Out Cath Urine  Result Value Ref Range Status   Specimen Description   Final    IN/OUT CATH URINE Performed at [facility] [address] [place], [place] [address]    Special Requests   Final    NONE Performed at [facility], [place] [address] [place], [place] [address]    Culture   Final    NO GROWTH Performed at [facility], [place] [address], [place], [place] [address]    Report Status 02/22/2022 FINAL  Final          Radiology Studies: DG CHEST PORT 1 VIEW  Result Date: 02/25/2022 CLINICAL DATA:  Dyspnea EXAM: PORTABLE CHEST 1 VIEW COMPARISON:  02/20/2022 FINDINGS: Stable heart size. Prior sternotomy and CABG. Aortic atherosclerosis. Worsening bilateral perihilar and right basilar airspace opacities. No pleural effusion or pneumothorax. IMPRESSION: Worsening bilateral perihilar and right basilar airspace opacities. Electronically Signed   By: Davina Poke D.O.   On: 02/25/2022 08:39        Scheduled Meds:  docusate sodium  100 mg Oral BID   enoxaparin (LOVENOX) injection  40 mg Subcutaneous Q24H   feeding supplement  237 mL Oral TID BM   FLUoxetine  20 mg Oral q morning   folic acid-pyridoxine-cyancobalamin  1 tablet Oral Daily   guaiFENesin  600 mg Oral BID   insulin aspart  0-9 Units Subcutaneous TID WC   metoprolol tartrate  12.5 mg Oral BID   polyethylene glycol  17 g Oral Daily   Continuous Infusions:  ampicillin-sulbactam (UNASYN) IV 3 g (02/26/22 0159)   dextrose 5 % lactated ringers with kcl 50 mL/hr at 02/26/22 0247           Aline August, MD [facility] 02/26/2022, 8:23 AM

## 2022-02-26 NOTE — Progress Notes (Signed)
Pharmacy Antibiotic Note  Derek [name] is a 76 y.o. male who was recently hospitalized and discharged on 02/17/22 who presented to the ED on 5/13 from [facility] d/t tachycardia, hypoxia and lethargy. X-ray showed possible pneumonia. He was started on cefepime at admission. On 5/15 abx were de-escalated to IV Unasyn. Chest X-ray on 5/19 indicated worsening opacities and WBC up to 28.3. Pharmacy has been consulted to broaden abx to vancomycin and Zosyn considering worsening leukocytosis and chest x-ray findings.   Plan: -vancomycin 1500 mg Q24H for estimated AUC 493 -Zosyn 3.375 g Q8H infuse over 4 hours -Monitor renal function, clinical status   Height: '5\' 7"'$  (170.2 cm) Weight: 69.2 kg (152 lb 8 oz) IBW/kg (Calculated) : 66.1  Temp (24hrs), Avg:99.1 F (37.3 C), Min:98.2 F (36.8 C), Max:99.7 F (37.6 C)  Recent Labs  Lab 02/20/22 1017 02/20/22 1310 02/21/22 0509 02/22/22 0447 02/23/22 0509 02/24/22 0506 02/25/22 0559 02/26/22 0458  WBC 13.3*  --    < > 11.7* 16.6* 25.5* 26.5* 28.3*  CREATININE 1.04  --    < > 0.78 0.84 0.81 0.91 0.90  LATICACIDVEN 2.2* 1.6  --   --   --   --   --   --    < > = values in this interval not displayed.    Estimated Creatinine Clearance: 68.3 mL/min (by C-G formula based on SCr of 0.9 mg/dL).    No Known Allergies  Antimicrobials this admission: Unasyn 5/15>> 5/19 Azithromycin 5/13>> 5/13 Cefepime 5/13 >> 5/15 Ceftriaxone 5/13>> 5/13 Metronidazole 5/14 >> 5/14 Vancomycin 5/13>> 5/13  Dose adjustments this admission: 5/14 cefepime Q12H>>Q8H  Microbiology results: 5/13 BCx: No growth x 5 days 5/13 UCx: No growth   5/13 MRSA PCR: Negative  Thank you for allowing pharmacy to be a part of this patient's care.  Rodolph Bong, PharmD Candidate 02/26/2022 11:32 AM

## 2022-02-27 DIAGNOSIS — J690 Pneumonitis due to inhalation of food and vomit: Secondary | ICD-10-CM | POA: Diagnosis not present

## 2022-02-27 DIAGNOSIS — I10 Essential (primary) hypertension: Secondary | ICD-10-CM | POA: Diagnosis not present

## 2022-02-27 DIAGNOSIS — A419 Sepsis, unspecified organism: Secondary | ICD-10-CM | POA: Diagnosis not present

## 2022-02-27 DIAGNOSIS — J9601 Acute respiratory failure with hypoxia: Secondary | ICD-10-CM | POA: Diagnosis not present

## 2022-02-27 LAB — BASIC METABOLIC PANEL
Anion gap: 5 (ref 5–15)
BUN: 15 mg/dL (ref 8–23)
CO2: 24 mmol/L (ref 22–32)
Calcium: 9 mg/dL (ref 8.9–10.3)
Chloride: 117 mmol/L — ABNORMAL HIGH (ref 98–111)
Creatinine, Ser: 0.95 mg/dL (ref 0.61–1.24)
GFR, Estimated: >60 mL/min (ref >60)
Glucose, Bld: 99 mg/dL (ref 70–99)
Potassium: 3.4 mmol/L — ABNORMAL LOW (ref 3.5–5.1)
Sodium: 146 mmol/L — ABNORMAL HIGH (ref 135–145)

## 2022-02-27 LAB — CBC WITH DIFFERENTIAL/PLATELET
Abs Immature Granulocytes: 1.14 10*3/uL — ABNORMAL HIGH (ref 0.00–0.07)
Basophils Absolute: 0.3 10*3/uL — ABNORMAL HIGH (ref 0.0–0.1)
Basophils Relative: 1 %
Eosinophils Absolute: 0.5 10*3/uL (ref 0.0–0.5)
Eosinophils Relative: 2 %
HCT: 33.9 % — ABNORMAL LOW (ref 39.0–52.0)
Hemoglobin: 11.4 g/dL — ABNORMAL LOW (ref 13.0–17.0)
Immature Granulocytes: 4 %
Lymphocytes Relative: 22 %
Lymphs Abs: 6.5 10*3/uL — ABNORMAL HIGH (ref 0.7–4.0)
MCH: 33.9 pg (ref 26.0–34.0)
MCHC: 33.6 g/dL (ref 30.0–36.0)
MCV: 100.9 fL — ABNORMAL HIGH (ref 80.0–100.0)
Monocytes Absolute: 2.5 10*3/uL — ABNORMAL HIGH (ref 0.1–1.0)
Monocytes Relative: 8 %
Neutro Abs: 19 10*3/uL — ABNORMAL HIGH (ref 1.7–7.7)
Neutrophils Relative %: 63 %
Platelets: 349 10*3/uL (ref 150–400)
RBC: 3.36 MIL/uL — ABNORMAL LOW (ref 4.22–5.81)
RDW: 15.2 % (ref 11.5–15.5)
WBC: 29.9 10*3/uL — ABNORMAL HIGH (ref 4.0–10.5)
nRBC: 0.1 % (ref 0.0–0.2)

## 2022-02-27 LAB — GLUCOSE, CAPILLARY
Glucose-Capillary: 63 mg/dL — ABNORMAL LOW (ref 70–99)
Glucose-Capillary: 115 mg/dL — ABNORMAL HIGH (ref 70–99)
Glucose-Capillary: 111 mg/dL — ABNORMAL HIGH (ref 70–99)
Glucose-Capillary: 116 mg/dL — ABNORMAL HIGH (ref 70–99)

## 2022-02-27 LAB — MAGNESIUM: Magnesium: 2.2 mg/dL (ref 1.7–2.4)

## 2022-02-27 MED ORDER — DEXTROSE 50 % IV SOLN: 25.0000 g | Freq: Once | INTRAVENOUS | Status: AC

## 2022-02-27 MED ORDER — SODIUM CHLORIDE 0.9 % IV SOLN: INTRAVENOUS | Status: DC | PRN

## 2022-02-27 MED ORDER — DEXTROSE 50 % IV SOLN
INTRAVENOUS | Status: AC
  Administered 2022-02-27: 25 g via INTRAVENOUS
  Filled 2022-02-27: qty 50

## 2022-02-27 NOTE — Progress Notes (Signed)
Derek [name] had an episode of desaturation, he went from 92% on 6L to 83%. Titrated up to 13L, upper lobe with rhonchi. He was able to do some coughing and deep breathing. He stayed on 13L for about 10 mins, then I was able to titrate him down to the 6L

## 2022-02-27 NOTE — Progress Notes (Signed)
Hypoglycemic Event  CBG: 63  Treatment: D50 25 mL (12.5 gm)  Symptoms: None  Follow-up CBG: Time:0744 CBG Result:115  Possible Reasons for Event: Inadequate meal intake  Comments/MD notified: K. Starla Link, MD

## 2022-02-27 NOTE — Progress Notes (Signed)
PROGRESS NOTE    Derek [name]  CVE:[id number] DOB: [birth year] DOA: 02/20/2022 PCP: Lavone Orn, MD   Brief Narrative:  76 year old M with PMH of dementia, CAD/CABG, DM-2, HTN, OSA on CPAP, recent hospitalization for left hip fracture after mechanical fall for which he underwent closed reduction with percutaneous pin fixation on 02/12/22 and discharged to SNF on 02/17/22 presented with shortness of breath, fever, poor oral intake, dysphagia, lethargy and hypoxia to 80s and admitted for severe sepsis/respiratory failure/right lower lobe pneumonia concerning for aspiration pneumonia and started on IV antibiotics.  Palliative care consulted for goals of care discussion.  Assessment & Plan:   Severe sepsis: Present on admission Possible aspiration pneumonia Acute respiratory failure with hypoxia -Does not wear supplemental oxygen at home.  Respiratory status is worsening and currently requiring 9 L oxygen via high flow nasal cannula.  Wean off as able. -Antibiotics switched to vancomycin and Zosyn on 02/26/2022 considering worsening leukocytosis and chest x-ray findings.   -Blood and urine cultures negative so far. -Aspiration precautions.  Diet as per SLP recommendations. -Chest x-ray on 02/25/2022 showed worsening bilateral perihilar and right basilar opacities  Goals of care Physical deconditioning Decreased oral intake/moderate malnutrition -Palliative care evaluated the patient during this hospitalization.  Patient remains full code.  Overall prognosis is guarded to poor.   -CODE STATUS has been switched to DNR from 02/26/2022 onwards.  As per the wife, patient apparently is slightly better on 02/26/2022.  We will continue  with the same plan including same antibiotics for today.  dysphagia/odynophagia -Continue diet as per SLP recommendations.  Aspiration precaution.  Overall  oral intake is very poor.  Dementia without behavioral disturbance -As per patient's wife, patient's  cognition has been declining lately. -Fall/delirium precautions.  Avoid or minimize sedating medications  Recent closed left hip fracture -Status post surgical intervention on 02/12/2022.  Outpatient follow-up with orthopedics.  Weightbearing as tolerated.  Patient will need SNF placement after discharge.  Continue PT eval -Continue Lovenox for DVT prophylaxis while inpatient and switch to full dose aspirin on discharge  Leukocytosis -Worsening.  Antibiotics plan as above.  We will hold off on further investigations including blood cultures/CAT scan.  Hypernatremia -Possibly from poor intake and dehydration.  Improving.  Continue IV fluids with dextrose.  Repeat a.m. labs  Elevated LFTs -Mildly elevated LFTs, improving.  Questionable cause.  Monitor.  Rheumatoid arthritis -Methotrexate on hold  Benign essential hypertension -Blood pressure on the lower side.  Continue metoprolol.  Lasix on hold  CAD status post CABG in 2019 -No chest pain.  Outpatient follow-up with cardiology.  Continue metoprolol  Depression -Fluoxetine held because of mental status and poor oral intake.  Diabetes mellitus type 2 -A1c 5.1.  Jardiance, metformin on hold continue CBGs with SSI  Hyperlipidemia -Crestor on hold because of elevated LFTs  OSA on CPAP -Continue nightly CPAP  Hypokalemia -Continue replacement with IV fluids   DVT prophylaxis: lovenox Code Status:  Full code Family Communication: wife at bedside Disposition Plan: Status is: Inpatient Remains inpatient appropriate because: of severity of illness    Consultants: Palliative care  Procedures: None  Antimicrobials:  Anti-infectives (From admission, onward)    Start     Dose/Rate Route Frequency Ordered Stop   02/26/22 1100  piperacillin-tazobactam (ZOSYN) IVPB 3.375 g        3.375 g 12.5 mL/hr over 240 Minutes Intravenous Every 8 hours 02/26/22 1013     02/26/22 1100  vancomycin (VANCOREADY) IVPB 1500 mg/300 mL  1,500 mg 150 mL/hr over 120 Minutes Intravenous Every 24 hours 02/26/22 1013     02/22/22 0800  Ampicillin-Sulbactam (UNASYN) 3 g in sodium chloride 0.9 % 100 mL IVPB  Status:  Discontinued        3 g 200 mL/hr over 30 Minutes Intravenous Every 6 hours 02/22/22 0707 02/26/22 0946   02/21/22 1800  ceFEPIme (MAXIPIME) 2 g in sodium chloride 0.9 % 100 mL IVPB  Status:  Discontinued        2 g 200 mL/hr over 30 Minutes Intravenous Every 8 hours 02/21/22 1210 02/22/22 0707   02/21/22 1000  ceFEPIme (MAXIPIME) 2 g in sodium chloride 0.9 % 100 mL IVPB  Status:  Discontinued        2 g 200 mL/hr over 30 Minutes Intravenous Every 12 hours 02/20/22 1329 02/20/22 1746   02/21/22 1000  metroNIDAZOLE (FLAGYL) IVPB 500 mg  Status:  Discontinued        500 mg 100 mL/hr over 60 Minutes Intravenous Every 12 hours 02/21/22 0751 02/22/22 0707   02/20/22 1900  ceFEPIme (MAXIPIME) 2 g in sodium chloride 0.9 % 100 mL IVPB  Status:  Discontinued        2 g 200 mL/hr over 30 Minutes Intravenous Every 12 hours 02/20/22 1746 02/21/22 1210   02/20/22 1345  vancomycin (VANCOREADY) IVPB 1500 mg/300 mL        1,500 mg 150 mL/hr over 120 Minutes Intravenous STAT 02/20/22 1336 02/20/22 1644   02/20/22 1030  cefTRIAXone (ROCEPHIN) 2 g in sodium chloride 0.9 % 100 mL IVPB  Status:  Discontinued        2 g 200 mL/hr over 30 Minutes Intravenous Every 24 hours 02/20/22 1023 02/20/22 1329   02/20/22 1030  azithromycin (ZITHROMAX) 500 mg in sodium chloride 0.9 % 250 mL IVPB  Status:  Discontinued        500 mg 250 mL/hr over 60 Minutes Intravenous Every 24 hours 02/20/22 1023 02/20/22 1329        Subjective: Patient seen and examined at bedside.  Wife at bedside.  No seizures, agitation, fever or vomiting reported.  Overall oral intake slightly better yesterday as per the wife.   Remains lethargic mostly in the evenings.   Objective: Vitals:   02/26/22 1900 02/26/22 2122 02/26/22 2207 02/27/22 0443  BP: 118/78 108/70  108/70 111/75  Pulse:  100 (!) 107 85  Resp: (!) 26  (!) 27   Temp: 97.9 F (36.6 C) 98.7 F (37.1 C)  98.1 F (36.7 C)  TempSrc: Oral Oral  Oral  SpO2: 97% 94% 96% 97%  Weight:      Height:        Intake/Output Summary (Last 24 hours) at 02/27/2022 0747 Last data filed at 02/27/2022 0600 Gross per 24 hour  Intake 2201.45 ml  Output 500 ml  Net 1701.45 ml    Filed Weights   02/20/22 1530  Weight: 69.2 kg    Examination:  General: On 9 L oxygen via high flow nasal cannula.  No acute distress.  Chronically ill and deconditioned looking elderly gentleman lying in bed.   ENT/neck: No palpable thyromegaly.  JVD is not elevated  respiratory: Intermittent tachypnea present; decreased breath sounds at bases with some crackles, no wheezing  CVS: S1 and S2 heard; tachycardic intermittently  abdominal: Soft, nontender, still mildly distended; no organomegaly, bowel sounds heard  extremities: No cyanosis; lower extremity edema present  CNS: Still extremely slow to respond and confused; more  awake this morning.  No focal neurologic deficit.  He is able to move extremities  lymph: No obvious cervical lymphadenopathy Skin: No obvious petechiae/rashes  psych: No signs of agitation.  Flat affect  musculoskeletal: No obvious joint deformity/tenderness/swelling      Data Reviewed: I have personally reviewed following labs and imaging studies  CBC: Recent Labs  Lab 02/20/22 1017 02/21/22 0509 02/23/22 0509 02/24/22 0506 02/25/22 0559 02/26/22 0458 02/27/22 0532  WBC 13.3*   < > 16.6* 25.5* 26.5* 28.3* 29.9*  NEUTROABS 11.9*  --   --   --  20.4* 19.7* PENDING  HGB 14.4   < > 12.0* 12.1* 12.2* 12.0* 11.4*  HCT 40.9   < > 35.8* 36.0* 35.8* 36.1* 33.9*  MCV 99.0   < > 100.3* 101.4* 99.7 100.8* 100.9*  PLT 338   < > 325 260 283 306 349   < > = values in this interval not displayed.    Basic Metabolic Panel: Recent Labs  Lab 02/22/22 0447 02/23/22 0509 02/24/22 0506  02/25/22 0559 02/26/22 0458 02/27/22 0532  NA 140 143 144 147* 148* 146*  K 3.3* 3.2* 3.5 3.3* 3.3* 3.4*  CL 109 111 113* 116* 118* 117*  CO2 '22 25 23 23 23 24  '$ GLUCOSE 81 107* 128* 110* 110* 99  BUN '23 18 16 16 17 15  '$ CREATININE 0.78 0.84 0.81 0.91 0.90 0.95  CALCIUM 9.0 8.8* 8.8* 9.0 9.2 9.0  MG 2.1 2.1 1.9 2.2 2.1 2.2  PHOS 1.6*  --   --   --   --   --     GFR: Estimated Creatinine Clearance: 64.7 mL/min (by C-G formula based on SCr of 0.95 mg/dL). Liver Function Tests: Recent Labs  Lab 02/22/22 0447 02/23/22 0509 02/24/22 0506 02/25/22 0559 02/26/22 0458  AST 143* 130* 103* 82* 75*  ALT 61* 66* 59* 51* 44  ALKPHOS 92 101 98 113 94  BILITOT 1.1 1.0 1.1 1.4* 1.1  PROT 6.0* 6.0* 5.8* 6.2* 6.3*  ALBUMIN 2.2* 2.3* 2.1* 2.2* 2.2*    No results for input(s): LIPASE, AMYLASE in the last 168 hours. Recent Labs  Lab 02/26/22 0458  AMMONIA 33    Coagulation Profile: Recent Labs  Lab 02/20/22 1017 02/21/22 0509  INR 1.4* 1.4*    Cardiac Enzymes: Recent Labs  Lab 02/22/22 0447  CKTOTAL 119    BNP (last 3 results) No results for input(s): PROBNP in the last 8760 hours. HbA1C: No results for input(s): HGBA1C in the last 72 hours. CBG: Recent Labs  Lab 02/26/22 1152 02/26/22 1647 02/26/22 2121 02/27/22 0728 02/27/22 0744  GLUCAP 119* 103* 97 63* 115*    Lipid Profile: No results for input(s): CHOL, HDL, LDLCALC, TRIG, CHOLHDL, LDLDIRECT in the last 72 hours. Thyroid Function Tests: Recent Labs    02/26/22 0458  TSH 0.221*    Anemia Panel: Recent Labs    02/26/22 0458  VITAMINB12 2,018*    Sepsis Labs: Recent Labs  Lab 02/20/22 1017 02/20/22 1310 02/21/22 0509 02/22/22 0447 02/23/22 0509  PROCALCITON  --   --  7.90 7.22 4.14  LATICACIDVEN 2.2* 1.6  --   --   --      Recent Results (from the past 240 hour(s))  Blood Culture (routine x 2)     Status: None   Collection Time: 02/20/22 10:27 AM   Specimen: BLOOD  Result Value Ref  Range Status   Specimen Description   Final    BLOOD RIGHT  ANTECUBITAL Performed at [facility], [place] [address] [place], [place] [address]    Special Requests   Final    BOTTLES DRAWN AEROBIC AND ANAEROBIC Blood Culture results may not be optimal due to an excessive volume of blood received in culture bottles Performed at [place] [address], [place], [place] [address]    Culture   Final    NO GROWTH 5 DAYS Performed at [facility], [place] [address], [place], [place] [address]    Report Status 02/25/2022 FINAL  Final  Resp Panel by RT-PCR (Flu A&B, Covid) Nasopharyngeal Swab     Status: None   Collection Time: 02/20/22 10:31 AM   Specimen: Nasopharyngeal Swab; Nasopharyngeal(NP) swabs in vial transport medium  Result Value Ref Range Status   SARS Coronavirus 2 by RT PCR NEGATIVE NEGATIVE Final    Comment: (NOTE) SARS-CoV-2 target nucleic acids are NOT DETECTED.  The SARS-CoV-2 RNA is generally detectable in upper respiratory specimens during the acute phase of infection. The lowest concentration of SARS-CoV-2 viral copies this assay can detect is 138 copies/mL. A negative result does not preclude SARS-Cov-2 infection and should not be used as the sole basis for treatment or other patient management decisions. A negative result may occur with  improper specimen collection/handling, submission of specimen other than nasopharyngeal swab, presence of viral mutation(s) within the areas targeted by this assay, and inadequate number of viral copies(<138 copies/mL). A negative result must be combined with clinical observations, patient history, and epidemiological information. The expected result is Negative.  Fact Sheet for Patients:  EntrepreneurPulse.com.au  Fact Sheet for Healthcare Providers:  IncredibleEmployment.be  This test is no t yet approved or cleared by the [place] FDA and   has been authorized for detection and/or diagnosis of SARS-CoV-2 by FDA under an Emergency Use Authorization (EUA). This EUA will remain  in effect (meaning this test can be used) for the duration of the COVID-19 declaration under Section 564(b)(1) of the Act, 21 U.S.C.section 360bbb-3(b)(1), unless the authorization is terminated  or revoked sooner.       Influenza A by PCR NEGATIVE NEGATIVE Final   Influenza B by PCR NEGATIVE NEGATIVE Final    Comment: (NOTE) The Xpert Xpress SARS-CoV-2/FLU/RSV plus assay is intended as an aid in the diagnosis of influenza from Nasopharyngeal swab specimens and should not be used as a sole basis for treatment. Nasal washings and aspirates are unacceptable for Xpert Xpress SARS-CoV-2/FLU/RSV testing.  Fact Sheet for Patients: EntrepreneurPulse.com.au  Fact Sheet for Healthcare Providers: IncredibleEmployment.be  This test is not yet approved or cleared by the [place] FDA and has been authorized for detection and/or diagnosis of SARS-CoV-2 by FDA under an Emergency Use Authorization (EUA). This EUA will remain in effect (meaning this test can be used) for the duration of the COVID-19 declaration under Section 564(b)(1) of the Act, 21 U.S.C. section 360bbb-3(b)(1), unless the authorization is terminated or revoked.  Performed at [facility], [place] [address] [place], [place] [address]   Blood Culture (routine x 2)     Status: None   Collection Time: 02/20/22 10:40 AM   Specimen: BLOOD  Result Value Ref Range Status   Specimen Description   Final    BLOOD LEFT ANTECUBITAL Performed at [facility] [address], [place], [place] [address]    Special Requests   Final    BOTTLES DRAWN AEROBIC AND ANAEROBIC Blood Culture adequate volume Performed at [facility]  [place] [address] [place], [place] [address]    Culture   Final    NO GROWTH 5  DAYS Performed at [facility], [place] [address] [place], [place] [address]    Report Status 02/25/2022 FINAL  Final  MRSA Next Gen by PCR, Nasal     Status: None   Collection Time: 02/20/22  1:09 PM   Specimen: Nasal Mucosa; Nasal Swab  Result Value Ref Range Status   MRSA by PCR Next Gen NOT DETECTED NOT DETECTED Final    Comment: (NOTE) The GeneXpert MRSA Assay (FDA approved for NASAL specimens only), is one component of a comprehensive MRSA colonization surveillance program. It is not intended to diagnose MRSA infection nor to guide or monitor treatment for MRSA infections. Test performance is not FDA approved in patients less than 1 years old. Performed at [facility], [place] [address], [place], [place] [address]   Urine Culture     Status: None   Collection Time: 02/20/22  2:46 PM   Specimen: In/Out Cath Urine  Result Value Ref Range Status   Specimen Description   Final    IN/OUT CATH URINE Performed at [place] [facility] [address], [place], [place] [address]    Special Requests   Final    NONE Performed at [facility], [place] [address] [place], [place] [address]    Culture   Final    NO GROWTH Performed at [facility], [place] [address] [place], [place] [address]    Report Status 02/22/2022 FINAL  Final          Radiology Studies: DG CHEST PORT 1 VIEW  Result Date: 02/25/2022 CLINICAL DATA:  Dyspnea EXAM: PORTABLE CHEST 1 VIEW COMPARISON:  02/20/2022 FINDINGS: Stable heart size. Prior sternotomy and CABG. Aortic atherosclerosis. Worsening bilateral perihilar and right basilar airspace opacities. No pleural effusion or pneumothorax. IMPRESSION: Worsening bilateral perihilar and right basilar airspace opacities. Electronically Signed   By: Davina Poke D.O.   On: 02/25/2022 08:39        Scheduled Meds:  dextrose       dextrose  25 g Intravenous Once   docusate sodium  100 mg Oral BID    feeding supplement  237 mL Oral TID BM   folic acid-pyridoxine-cyancobalamin  1 tablet Oral Daily   guaiFENesin  600 mg Oral BID   insulin aspart  0-9 Units Subcutaneous TID WC   metoprolol tartrate  12.5 mg Oral BID   polyethylene glycol  17 g Oral Daily   Continuous Infusions:  dextrose 5 % lactated ringers with kcl 50 mL/hr at 02/26/22 2238   piperacillin-tazobactam (ZOSYN)  IV 3.375 g (02/27/22 0243)   vancomycin 1,500 mg (02/26/22 1147)          Aline August, MD [facility] 02/27/2022, 7:47 AM

## 2022-02-28 DIAGNOSIS — J690 Pneumonitis due to inhalation of food and vomit: Secondary | ICD-10-CM | POA: Diagnosis not present

## 2022-02-28 DIAGNOSIS — S72002D Fracture of unspecified part of neck of left femur, subsequent encounter for closed fracture with routine healing: Secondary | ICD-10-CM | POA: Diagnosis not present

## 2022-02-28 DIAGNOSIS — A419 Sepsis, unspecified organism: Secondary | ICD-10-CM | POA: Diagnosis not present

## 2022-02-28 DIAGNOSIS — J9601 Acute respiratory failure with hypoxia: Secondary | ICD-10-CM | POA: Diagnosis not present

## 2022-02-28 LAB — CBC WITH DIFFERENTIAL/PLATELET
Abs Immature Granulocytes: 0.78 10*3/uL — ABNORMAL HIGH (ref 0.00–0.07)
Basophils Absolute: 0.2 10*3/uL — ABNORMAL HIGH (ref 0.0–0.1)
Basophils Relative: 1 %
Eosinophils Absolute: 0.3 10*3/uL (ref 0.0–0.5)
Eosinophils Relative: 1 %
HCT: 29.1 % — ABNORMAL LOW (ref 39.0–52.0)
Hemoglobin: 9.7 g/dL — ABNORMAL LOW (ref 13.0–17.0)
Immature Granulocytes: 3 %
Lymphocytes Relative: 28 %
Lymphs Abs: 6.5 10*3/uL — ABNORMAL HIGH (ref 0.7–4.0)
MCH: 33.4 pg (ref 26.0–34.0)
MCHC: 33.3 g/dL (ref 30.0–36.0)
MCV: 100.3 fL — ABNORMAL HIGH (ref 80.0–100.0)
Monocytes Absolute: 1.9 10*3/uL — ABNORMAL HIGH (ref 0.1–1.0)
Monocytes Relative: 8 %
Neutro Abs: 13.4 10*3/uL — ABNORMAL HIGH (ref 1.7–7.7)
Neutrophils Relative %: 59 %
Platelets: 358 10*3/uL (ref 150–400)
RBC: 2.9 MIL/uL — ABNORMAL LOW (ref 4.22–5.81)
RDW: 15.3 % (ref 11.5–15.5)
WBC: 23 10*3/uL — ABNORMAL HIGH (ref 4.0–10.5)
nRBC: 0.2 % (ref 0.0–0.2)

## 2022-02-28 LAB — GLUCOSE, CAPILLARY
Glucose-Capillary: 86 mg/dL (ref 70–99)
Glucose-Capillary: 99 mg/dL (ref 70–99)
Glucose-Capillary: 98 mg/dL (ref 70–99)
Glucose-Capillary: 90 mg/dL (ref 70–99)

## 2022-02-28 LAB — COMPREHENSIVE METABOLIC PANEL
ALT: 41 U/L (ref 0–44)
AST: 74 U/L — ABNORMAL HIGH (ref 15–41)
Albumin: 1.8 g/dL — ABNORMAL LOW (ref 3.5–5.0)
Alkaline Phosphatase: 96 U/L (ref 38–126)
Anion gap: 5 (ref 5–15)
BUN: 12 mg/dL (ref 8–23)
CO2: 25 mmol/L (ref 22–32)
Calcium: 8.2 mg/dL — ABNORMAL LOW (ref 8.9–10.3)
Chloride: 111 mmol/L (ref 98–111)
Creatinine, Ser: 0.9 mg/dL (ref 0.61–1.24)
GFR, Estimated: >60 mL/min (ref >60)
Glucose, Bld: 226 mg/dL — ABNORMAL HIGH (ref 70–99)
Potassium: 4.5 mmol/L (ref 3.5–5.1)
Sodium: 141 mmol/L (ref 135–145)
Total Bilirubin: 0.9 mg/dL (ref 0.3–1.2)
Total Protein: 6.1 g/dL — ABNORMAL LOW (ref 6.5–8.1)

## 2022-02-28 LAB — MAGNESIUM: Magnesium: 1.9 mg/dL (ref 1.7–2.4)

## 2022-02-28 NOTE — Progress Notes (Signed)
PROGRESS NOTE    Derek [name]  CBU:[id number] DOB: 02-05-1946 DOA: 02/20/2022 PCP: Lavone Orn, MD   Brief Narrative:  76 year old M with PMH of dementia, CAD/CABG, DM-2, HTN, OSA on CPAP, recent hospitalization for left hip fracture after mechanical fall for which he underwent closed reduction with percutaneous pin fixation on 02/12/22 and discharged to SNF on 02/17/22 presented with shortness of breath, fever, poor oral intake, dysphagia, lethargy and hypoxia to 80s and admitted for severe sepsis/respiratory failure/right lower lobe pneumonia concerning for aspiration pneumonia and started on IV antibiotics.  Palliative care consulted for goals of care discussion.  Assessment & Plan:   Severe sepsis: Present on admission Possible aspiration pneumonia Acute respiratory failure with hypoxia -Does not wear supplemental oxygen at home.  Respiratory status is still tenuous and currently requiring 6 L oxygen via nasal cannula.  Wean off as able. -Antibiotics switched to vancomycin and Zosyn on 02/26/2022 considering worsening leukocytosis and chest x-ray findings.   -Blood and urine cultures negative so far. -Aspiration precautions.  Diet as per SLP recommendations. -Chest x-ray on 02/25/2022 showed worsening bilateral perihilar and right basilar opacities  Goals of care Physical deconditioning Decreased oral intake/moderate malnutrition -Palliative care evaluated the patient during this hospitalization.  Patient remains full code.  Overall prognosis is guarded to poor.   -CODE STATUS has been switched to DNR from 02/26/2022 onwards.  As per the wife, patient apparently is slightly better on 02/26/2022.  We will continue  with the same plan including same antibiotics for today.  dysphagia/odynophagia -Continue diet as per SLP recommendations.  Aspiration precaution.  Overall  oral intake is very poor.  Dementia without behavioral disturbance -As per patient's wife, patient's cognition has  been declining lately. -Fall/delirium precautions.  Avoid or minimize sedating medications  Recent closed left hip fracture -Status post surgical intervention on 02/12/2022.  Outpatient follow-up with orthopedics.  Weightbearing as tolerated.  Patient will need SNF placement after discharge.  Continue PT eval -Continue Lovenox for DVT prophylaxis while inpatient and switch to full dose aspirin on discharge  Leukocytosis Improving to 23 today.  Antibiotics plan as above.  We will hold off on further investigations including blood cultures/CAT scan.  Anemia of chronic disease -Hemoglobin currently stable.  Monitor intermittently  Hypernatremia -Possibly from poor intake and dehydration.  Sodium level pending today.  Continue IV fluids with dextrose.  Repeat a.m. labs  Elevated LFTs -Mildly elevated LFTs, improving.  Questionable cause.  Monitor.  Rheumatoid arthritis -Methotrexate on hold  Benign essential hypertension -Blood pressure on the lower side.  Continue metoprolol.  Lasix on hold  CAD status post CABG in 2019 -No chest pain.  Outpatient follow-up with cardiology.  Continue metoprolol  Depression -Fluoxetine held because of mental status and poor oral intake.  Diabetes mellitus type 2 -A1c 5.1.  Jardiance, metformin on hold continue CBGs with SSI  Hyperlipidemia -Crestor on hold because of elevated LFTs  OSA on CPAP -Continue nightly CPAP  Hypokalemia -Continue replacement with IV fluids.  Potassium level pending today   DVT prophylaxis: lovenox Code Status: DNR  family Communication: wife at bedside Disposition Plan: Status is: Inpatient Remains inpatient appropriate because: of severity of illness    Consultants: Palliative care  Procedures: None  Antimicrobials:  Anti-infectives (From admission, onward)    Start     Dose/Rate Route Frequency Ordered Stop   02/26/22 1100  piperacillin-tazobactam (ZOSYN) IVPB 3.375 g        3.375 g 12.5 mL/hr over  240 Minutes  Intravenous Every 8 hours 02/26/22 1013     02/26/22 1100  vancomycin (VANCOREADY) IVPB 1500 mg/300 mL        1,500 mg 150 mL/hr over 120 Minutes Intravenous Every 24 hours 02/26/22 1013     02/22/22 0800  Ampicillin-Sulbactam (UNASYN) 3 g in sodium chloride 0.9 % 100 mL IVPB  Status:  Discontinued        3 g 200 mL/hr over 30 Minutes Intravenous Every 6 hours 02/22/22 0707 02/26/22 0946   02/21/22 1800  ceFEPIme (MAXIPIME) 2 g in sodium chloride 0.9 % 100 mL IVPB  Status:  Discontinued        2 g 200 mL/hr over 30 Minutes Intravenous Every 8 hours 02/21/22 1210 02/22/22 0707   02/21/22 1000  ceFEPIme (MAXIPIME) 2 g in sodium chloride 0.9 % 100 mL IVPB  Status:  Discontinued        2 g 200 mL/hr over 30 Minutes Intravenous Every 12 hours 02/20/22 1329 02/20/22 1746   02/21/22 1000  metroNIDAZOLE (FLAGYL) IVPB 500 mg  Status:  Discontinued        500 mg 100 mL/hr over 60 Minutes Intravenous Every 12 hours 02/21/22 0751 02/22/22 0707   02/20/22 1900  ceFEPIme (MAXIPIME) 2 g in sodium chloride 0.9 % 100 mL IVPB  Status:  Discontinued        2 g 200 mL/hr over 30 Minutes Intravenous Every 12 hours 02/20/22 1746 02/21/22 1210   02/20/22 1345  vancomycin (VANCOREADY) IVPB 1500 mg/300 mL        1,500 mg 150 mL/hr over 120 Minutes Intravenous STAT 02/20/22 1336 02/20/22 1644   02/20/22 1030  cefTRIAXone (ROCEPHIN) 2 g in sodium chloride 0.9 % 100 mL IVPB  Status:  Discontinued        2 g 200 mL/hr over 30 Minutes Intravenous Every 24 hours 02/20/22 1023 02/20/22 1329   02/20/22 1030  azithromycin (ZITHROMAX) 500 mg in sodium chloride 0.9 % 250 mL IVPB  Status:  Discontinued        500 mg 250 mL/hr over 60 Minutes Intravenous Every 24 hours 02/20/22 1023 02/20/22 1329        Subjective: Patient seen and examined at bedside.  Wife at bedside.  No overnight agitation, seizures, fever or vomiting reported.  Oral intake was better yesterday, as per the wife. Objective: Vitals:    02/27/22 2000 02/27/22 2154 02/27/22 2318 02/28/22 0243  BP: 115/67 115/67    Pulse: 87 87 81   Resp: (!) 26  (!) 27   Temp: 98.6 F (37 C)   98 F (36.7 C)  TempSrc: Oral   Axillary  SpO2:   97% 97%  Weight:      Height:        Intake/Output Summary (Last 24 hours) at 02/28/2022 0738 Last data filed at 02/28/2022 0600 Gross per 24 hour  Intake 2083.41 ml  Output 1450 ml  Net 633.41 ml    Filed Weights   02/20/22 1530  Weight: 69.2 kg    Examination:  General: No distress.  Currently on 6 L oxygen via nasal cannula.  Chronically ill and deconditioned looking elderly gentleman lying in bed.   ENT/neck: No obvious thyromegaly.  No JVD elevation respiratory: Bilateral decreased breath sounds at bases with some scattered crackles and intermittently tachypneic  CVS: Rate controlled currently; S1-S2 heard  abdominal: Soft, nontender, distended some; no organomegaly, normal bowel sounds are heard  extremities: Bilateral lower extremity edema present; no clubbing  CNS: More awake; still very slow to respond and slightly confused.  No focal neurologic deficit.  Still able to move extremities lymph: No palpable obvious lymphadenopathy  skin: No obvious ecchymosis/lesions  psych: Does not participate in conversation much.  Currently not agitated musculoskeletal: No obvious joint deformity/tenderness/swelling      Data Reviewed: I have personally reviewed following labs and imaging studies  CBC: Recent Labs  Lab 02/24/22 0506 02/25/22 0559 02/26/22 0458 02/27/22 0532 02/28/22 0629  WBC 25.5* 26.5* 28.3* 29.9* 23.0*  NEUTROABS  --  20.4* 19.7* 19.0* PENDING  HGB 12.1* 12.2* 12.0* 11.4* 9.7*  HCT 36.0* 35.8* 36.1* 33.9* 29.1*  MCV 101.4* 99.7 100.8* 100.9* 100.3*  PLT 260 283 306 349 563    Basic Metabolic Panel: Recent Labs  Lab 02/22/22 0447 02/23/22 0509 02/24/22 0506 02/25/22 0559 02/26/22 0458 02/27/22 0532  NA 140 143 144 147* 148* 146*  K 3.3* 3.2* 3.5  3.3* 3.3* 3.4*  CL 109 111 113* 116* 118* 117*  CO2 '22 25 23 23 23 24  '$ GLUCOSE 81 107* 128* 110* 110* 99  BUN '23 18 16 16 17 15  '$ CREATININE 0.78 0.84 0.81 0.91 0.90 0.95  CALCIUM 9.0 8.8* 8.8* 9.0 9.2 9.0  MG 2.1 2.1 1.9 2.2 2.1 2.2  PHOS 1.6*  --   --   --   --   --     GFR: Estimated Creatinine Clearance: 64.7 mL/min (by C-G formula based on SCr of 0.95 mg/dL). Liver Function Tests: Recent Labs  Lab 02/22/22 0447 02/23/22 0509 02/24/22 0506 02/25/22 0559 02/26/22 0458  AST 143* 130* 103* 82* 75*  ALT 61* 66* 59* 51* 44  ALKPHOS 92 101 98 113 94  BILITOT 1.1 1.0 1.1 1.4* 1.1  PROT 6.0* 6.0* 5.8* 6.2* 6.3*  ALBUMIN 2.2* 2.3* 2.1* 2.2* 2.2*    No results for input(s): LIPASE, AMYLASE in the last 168 hours. Recent Labs  Lab 02/26/22 0458  AMMONIA 33    Coagulation Profile: No results for input(s): INR, PROTIME in the last 168 hours.  Cardiac Enzymes: Recent Labs  Lab 02/22/22 0447  CKTOTAL 119    BNP (last 3 results) No results for input(s): PROBNP in the last 8760 hours. HbA1C: No results for input(s): HGBA1C in the last 72 hours. CBG: Recent Labs  Lab 02/27/22 0728 02/27/22 0744 02/27/22 1154 02/27/22 1612 02/28/22 0729  GLUCAP 63* 115* 111* 116* 86    Lipid Profile: No results for input(s): CHOL, HDL, LDLCALC, TRIG, CHOLHDL, LDLDIRECT in the last 72 hours. Thyroid Function Tests: Recent Labs    02/26/22 0458  TSH 0.221*    Anemia Panel: Recent Labs    02/26/22 0458  VITAMINB12 2,018*    Sepsis Labs: Recent Labs  Lab 02/22/22 0447 02/23/22 0509  PROCALCITON 7.22 4.14     Recent Results (from the past 240 hour(s))  Blood Culture (routine x 2)     Status: None   Collection Time: 02/20/22 10:27 AM   Specimen: BLOOD  Result Value Ref Range Status   Specimen Description   Final    BLOOD RIGHT ANTECUBITAL Performed at [facility] [address] [place], [place] [address]    Special Requests   Final     BOTTLES DRAWN AEROBIC AND ANAEROBIC Blood Culture results may not be optimal due to an excessive volume of blood received in culture bottles Performed at [facility] [address] [place], [place] [address]    Culture  Final    NO GROWTH 5 DAYS Performed at [facility], [place] [address], [place], [place] [address]    Report Status 02/25/2022 FINAL  Final  Resp Panel by RT-PCR (Flu A&B, Covid) Nasopharyngeal Swab     Status: None   Collection Time: 02/20/22 10:31 AM   Specimen: Nasopharyngeal Swab; Nasopharyngeal(NP) swabs in vial transport medium  Result Value Ref Range Status   SARS Coronavirus 2 by RT PCR NEGATIVE NEGATIVE Final    Comment: (NOTE) SARS-CoV-2 target nucleic acids are NOT DETECTED.  The SARS-CoV-2 RNA is generally detectable in upper respiratory specimens during the acute phase of infection. The lowest concentration of SARS-CoV-2 viral copies this assay can detect is 138 copies/mL. A negative result does not preclude SARS-Cov-2 infection and should not be used as the sole basis for treatment or other patient management decisions. A negative result may occur with  improper specimen collection/handling, submission of specimen other than nasopharyngeal swab, presence of viral mutation(s) within the areas targeted by this assay, and inadequate number of viral copies(<138 copies/mL). A negative result must be combined with clinical observations, patient history, and epidemiological information. The expected result is Negative.  Fact Sheet for Patients:  EntrepreneurPulse.com.au  Fact Sheet for Healthcare Providers:  IncredibleEmployment.be  This test is no t yet approved or cleared by the [place] FDA and  has been authorized for detection and/or diagnosis of SARS-CoV-2 by FDA under an Emergency Use Authorization (EUA). This EUA will remain  in effect (meaning this test can be used) for the duration  of the COVID-19 declaration under Section 564(b)(1) of the Act, 21 U.S.C.section 360bbb-3(b)(1), unless the authorization is terminated  or revoked sooner.       Influenza A by PCR NEGATIVE NEGATIVE Final   Influenza B by PCR NEGATIVE NEGATIVE Final    Comment: (NOTE) The Xpert Xpress SARS-CoV-2/FLU/RSV plus assay is intended as an aid in the diagnosis of influenza from Nasopharyngeal swab specimens and should not be used as a sole basis for treatment. Nasal washings and aspirates are unacceptable for Xpert Xpress SARS-CoV-2/FLU/RSV testing.  Fact Sheet for Patients: EntrepreneurPulse.com.au  Fact Sheet for Healthcare Providers: IncredibleEmployment.be  This test is not yet approved or cleared by the [place] FDA and has been authorized for detection and/or diagnosis of SARS-CoV-2 by FDA under an Emergency Use Authorization (EUA). This EUA will remain in effect (meaning this test can be used) for the duration of the COVID-19 declaration under Section 564(b)(1) of the Act, 21 U.S.C. section 360bbb-3(b)(1), unless the authorization is terminated or revoked.  Performed at [facility], [place] [address], [place], [place] [address]   Blood Culture (routine x 2)     Status: None   Collection Time: 02/20/22 10:40 AM   Specimen: BLOOD  Result Value Ref Range Status   Specimen Description   Final    BLOOD LEFT ANTECUBITAL Performed at [facility] [address], [place], [place] [address]    Special Requests   Final    BOTTLES DRAWN AEROBIC AND ANAEROBIC Blood Culture adequate volume Performed at [facility] [address], [place], [place] [address]    Culture   Final    NO GROWTH 5 DAYS Performed at [facility], [place] [address] [place], [place] [address]    Report Status 02/25/2022 FINAL  Final  MRSA Next Gen by PCR, Nasal     Status: None   Collection Time: 02/20/22   1:09  PM   Specimen: Nasal Mucosa; Nasal Swab  Result Value Ref Range Status   MRSA by PCR Next Gen NOT DETECTED NOT DETECTED Final    Comment: (NOTE) The GeneXpert MRSA Assay (FDA approved for NASAL specimens only), is one component of a comprehensive MRSA colonization surveillance program. It is not intended to diagnose MRSA infection nor to guide or monitor treatment for MRSA infections. Test performance is not FDA approved in patients less than 57 years old. Performed at [facility], [place] [address], [place], [place] [address]   Urine Culture     Status: None   Collection Time: 02/20/22  2:46 PM   Specimen: In/Out Cath Urine  Result Value Ref Range Status   Specimen Description   Final    IN/OUT CATH URINE Performed at [facility] [address] [place], [place] [address]    Special Requests   Final    NONE Performed at [facility], [place] [address], [place], [place] [address]    Culture   Final    NO GROWTH Performed at [facility], [place] [address] [place], [place] [address]    Report Status 02/22/2022 FINAL  Final          Radiology Studies: No results found.      Scheduled Meds:  docusate sodium  100 mg Oral BID   feeding supplement  237 mL Oral TID BM   folic acid-pyridoxine-cyancobalamin  1 tablet Oral Daily   guaiFENesin  600 mg Oral BID   insulin aspart  0-9 Units Subcutaneous TID WC   metoprolol tartrate  12.5 mg Oral BID   polyethylene glycol  17 g Oral Daily   Continuous Infusions:  sodium chloride Stopped (02/27/22 1257)   dextrose 5 % lactated ringers with kcl 50 mL/hr at 02/27/22 1902   piperacillin-tazobactam (ZOSYN)  IV 3.375 g (02/28/22 0259)   vancomycin Stopped (02/27/22 1256)          Aline August, MD [facility] 02/28/2022, 7:38 AM

## 2022-02-28 NOTE — TOC Progression Note (Signed)
Transition of Care [facility]) - Progression Note    Patient Details  Name: Derek [name] MRN: [id number] Date of Birth: 01-05-1946  Transition of Care [facility] CM/SW Contact  Ross Ludwig, [place] Phone Number: 02/28/2022, 6:18 PM  Clinical Narrative:     Patient has [employer] besides [place].  His [employer] subscriber ID is [id number].  Patient was at [facility] under his [facility], will need [employer] authorization before patient can discharge to SNF once medically ready for discharge.  TOC to continue to follow patient's progress throughout discharge planning.   Expected Discharge Plan: [place] (from [facility]) Barriers to Discharge: Continued Medical Work up  Expected Discharge Plan and Services Expected Discharge Plan: [place] (from [facility])   Discharge Planning Services: CM Consult Post Acute Care Choice: [place] Living arrangements for the past 2 months: Single Family Home                                       Social Determinants of Health (SDOH) Interventions    Readmission Risk Interventions     View : No data to display.

## 2022-02-28 NOTE — Progress Notes (Signed)
After reviewing the patient's chart, I counseled with patient's dayshift nurse Amber.  Amber confirms patient is stable with no significant changes overnight.  Plan remains to continue with antibiotics, encourage PO intake and participation in PT/OT. Watchful waiting.  No acute PMT needs at this time.  Palliative medicine team will continue to follow the patient throughout his hospitalization.  [place] Ilsa Iha, FNP-BC Palliative Medicine Team Team Phone # [phone]  NO CHARGE

## 2022-03-01 DIAGNOSIS — J9601 Acute respiratory failure with hypoxia: Secondary | ICD-10-CM | POA: Diagnosis not present

## 2022-03-01 DIAGNOSIS — A419 Sepsis, unspecified organism: Secondary | ICD-10-CM | POA: Diagnosis not present

## 2022-03-01 DIAGNOSIS — J690 Pneumonitis due to inhalation of food and vomit: Secondary | ICD-10-CM | POA: Diagnosis not present

## 2022-03-01 DIAGNOSIS — I10 Essential (primary) hypertension: Secondary | ICD-10-CM | POA: Diagnosis not present

## 2022-03-01 DIAGNOSIS — S72002D Fracture of unspecified part of neck of left femur, subsequent encounter for closed fracture with routine healing: Secondary | ICD-10-CM | POA: Diagnosis not present

## 2022-03-01 DIAGNOSIS — Z66 Do not resuscitate: Secondary | ICD-10-CM

## 2022-03-01 LAB — CBC WITH DIFFERENTIAL/PLATELET
Abs Immature Granulocytes: 0.37 10*3/uL — ABNORMAL HIGH (ref 0.00–0.07)
Basophils Absolute: 0.2 10*3/uL — ABNORMAL HIGH (ref 0.0–0.1)
Basophils Relative: 1 %
Eosinophils Absolute: 0.2 10*3/uL (ref 0.0–0.5)
Eosinophils Relative: 1 %
HCT: 34.1 % — ABNORMAL LOW (ref 39.0–52.0)
Hemoglobin: 11.1 g/dL — ABNORMAL LOW (ref 13.0–17.0)
Immature Granulocytes: 2 %
Lymphocytes Relative: 26 %
Lymphs Abs: 5.8 10*3/uL — ABNORMAL HIGH (ref 0.7–4.0)
MCH: 32.6 pg (ref 26.0–34.0)
MCHC: 32.6 g/dL (ref 30.0–36.0)
MCV: 100.3 fL — ABNORMAL HIGH (ref 80.0–100.0)
Monocytes Absolute: 1.8 10*3/uL — ABNORMAL HIGH (ref 0.1–1.0)
Monocytes Relative: 8 %
Neutro Abs: 14 10*3/uL — ABNORMAL HIGH (ref 1.7–7.7)
Neutrophils Relative %: 62 %
Platelets: 407 10*3/uL — ABNORMAL HIGH (ref 150–400)
RBC: 3.4 MIL/uL — ABNORMAL LOW (ref 4.22–5.81)
RDW: 15.2 % (ref 11.5–15.5)
WBC: 22.3 10*3/uL — ABNORMAL HIGH (ref 4.0–10.5)
nRBC: 0.1 % (ref 0.0–0.2)

## 2022-03-01 LAB — BASIC METABOLIC PANEL
Anion gap: 6 (ref 5–15)
BUN: 12 mg/dL (ref 8–23)
CO2: 23 mmol/L (ref 22–32)
Calcium: 8.3 mg/dL — ABNORMAL LOW (ref 8.9–10.3)
Chloride: 109 mmol/L (ref 98–111)
Creatinine, Ser: 0.84 mg/dL (ref 0.61–1.24)
GFR, Estimated: >60 mL/min (ref >60)
Glucose, Bld: 88 mg/dL (ref 70–99)
Potassium: 3.3 mmol/L — ABNORMAL LOW (ref 3.5–5.1)
Sodium: 138 mmol/L (ref 135–145)

## 2022-03-01 LAB — GLUCOSE, CAPILLARY
Glucose-Capillary: 80 mg/dL (ref 70–99)
Glucose-Capillary: 85 mg/dL (ref 70–99)
Glucose-Capillary: 108 mg/dL — ABNORMAL HIGH (ref 70–99)
Glucose-Capillary: 92 mg/dL (ref 70–99)

## 2022-03-01 LAB — MAGNESIUM: Magnesium: 2 mg/dL (ref 1.7–2.4)

## 2022-03-01 NOTE — Progress Notes (Signed)
Physical Therapy Treatment Patient Details Name: Derek [name] MRN: [id number] DOB: 11/06/1945 Today's Date: 03/01/2022   History of Present Illness Derek [name] is a 76 y.o. male with medical history significant of CAD s/p CABG, DM2, HTN, OSA on CPAP, mild cognitive impairment. Presenting with dyspnea and fever. Recently admitted for hip fracture, s/p ORIF. Was sent to SNF for rehab on 5/10. While there he apparently became dyspneic and febrile. admitted with RLL pna    PT Comments    Pt NOT progressing.  Requires Total Assist + 2 just to briefly sit EOB.  General Comments: only brief moments of arousal.  Mostly lethargic.  Visibly weak.  Eyes closed.  Sleepy. General bed mobility comments: pt required Total Assist + 2 pt <5% self able to transition from supine to EOB.  Pt unable to support self sitting.  Poor collapsded posture, head downward and no self correction responce.  Slumped and still lethargic.  Answers some questions.  Profoundly weak. General transfer comment: unable to attempt any OOB transfer due to poor sitting tolerance.  [place] for any OOB transfer. Will update LPT poor progress.   Recommendations for follow up therapy are one component of a multi-disciplinary discharge planning process, led by the attending physician.  Recommendations may be updated based on patient status, additional functional criteria and insurance authorization.  Follow Up Recommendations  Skilled nursing-short term rehab (<3 hours/day)     Assistance Recommended at Discharge Frequent or constant Supervision/Assistance  Patient can return home with the following Two people to help with walking and/or transfers;Two people to help with bathing/dressing/bathroom;Direct supervision/assist for medications management;Help with stairs or ramp for entrance;Assist for transportation;Assistance with feeding;Direct supervision/assist for financial management;Assistance with cooking/housework    Equipment Recommendations  None recommended by PT    Recommendations for Other Services       Precautions / Restrictions Precautions Precautions: Fall Precaution Comments: monitor vitals Restrictions Weight Bearing Restrictions: No LLE Weight Bearing: Weight bearing as tolerated Other Position/Activity Restrictions: per previous notes recent admission for L hip fx     Mobility  Bed Mobility Overal bed mobility: Needs Assistance Bed Mobility: Supine to Sit, Sit to Supine     Supine to sit: Total assist, HOB elevated Sit to supine: Total assist, +2 for physical assistance, +2 for safety/equipment   General bed mobility comments: pt required Total Assist + 2 pt <5% self able to transition from supine to EOB.  Pt unable to support self sitting.  Poor collapsded posture, head downward and no self correction responce.  Slumped and still lethargic.  Answers some questions.  Profoundly weak.    Transfers                   General transfer comment: unable to attempt any OOB transfer due to poor sitting tolerance.  [place] for any OOB transfer.    Ambulation/Gait                   Stairs             Wheelchair Mobility    Modified Rankin (Stroke Patients Only)       Balance                                            Cognition Arousal/Alertness: Lethargic Behavior During Therapy: Flat affect  General Comments: only brief moments of arousal.  Mostly lethargic.  Visibly weak.  Eyes closed.  Sleepy.        Exercises      General Comments        Pertinent Vitals/Pain Pain Assessment Pain Assessment: No/denies pain    Home Living                          Prior Function            PT Goals (current goals can now be found in the care plan section) Progress towards PT goals: Progressing toward goals    Frequency    Min 2X/week      PT Plan  Current plan remains appropriate    Co-evaluation              AM-PAC PT "6 Clicks" Mobility   Outcome Measure  Help needed turning from your back to your side while in a flat bed without using bedrails?: Total Help needed moving from lying on your back to sitting on the side of a flat bed without using bedrails?: Total Help needed moving to and from a bed to a chair (including a wheelchair)?: Total Help needed standing up from a chair using your arms (e.g., wheelchair or bedside chair)?: Total Help needed to walk in hospital room?: Total Help needed climbing 3-5 steps with a railing? : Total 6 Click Score: 6    End of Session Equipment Utilized During Treatment: Gait belt Activity Tolerance: Patient limited by fatigue;Patient limited by lethargy Patient left: in bed;with call bell/phone within reach;with bed alarm set;with family/visitor present Nurse Communication: Mobility status PT Visit Diagnosis: Muscle weakness (generalized) (M62.81);Difficulty in walking, not elsewhere classified (R26.2)     Time: 4742-5956 PT Time Calculation (min) (ACUTE ONLY): 12 min  Charges:  $Therapeutic Activity: 8-22 mins                     Rica Koyanagi  PTA Acute  Rehabilitation Services Pager      [phone] Office      [phone]

## 2022-03-01 NOTE — Progress Notes (Signed)
OT Cancellation Note  Patient Details Name: RAYNALD ROUILLARD MRN: [id number] DOB: 03/30/1946   Cancelled Treatment:    Reason Eval/Treat Not Completed: Patient not medically ready Patient was noted to be on CPAP this AM with patient currently in [place]. OT to continue to follow and check back as schedule will allow.  Jackelyn Poling OTR/L, [facility] Acute Rehabilitation Department Office# [phone] Pager# [phone]  03/01/2022, 10:58 AM

## 2022-03-01 NOTE — Progress Notes (Signed)
SLP Cancellation Note  Patient Details Name: Derek [name] MRN: [id number] DOB: [birth year]   Cancelled treatment:       Reason Eval/Treat Not Completed: Patient's level of consciousness;Fatigue/lethargy limiting ability to participate Patient would open eyes briefly to voice but start falling back asleep. Per NT, patient has not been alert enough this morning for any PO's. SLP will f/u next date.  Sonia Baller, MA, CCC-SLP Speech Therapy

## 2022-03-01 NOTE — Progress Notes (Signed)
Palliative Care Progress Note, Assessment & Plan   Patient Name: Derek [name]       Date: 03/01/2022 DOB: [birth year]  Age: 76 y.o. MRN#: [id number] Attending Physician: Aline August, MD Primary Care Physician: Lavone Orn, MD Admit Date: 02/20/2022  Reason for Consultation/Follow-up: Establishing goals of care  Subjective: Patient is lying in bed in no apparent distress.  He does not awaken as I enter the room.  He does not acknowledge my presence and is unable to make his wishes known.  He does respond to quite loud verbal stimuli by opening his eyes but then quickly returns them closed.  Wife is at bedside.  HPI: 76 y.o. male  with past medical history of dementia, CAD/CABG, DM2, HTN, OSA (CPAP) and left hip fracture with pin 02/12/22 (d/c 02/16/22 to SNF) admitted on 02/20/2022 with RLL PNA. Pt was having SOB, fever, and poor PO intake at home. He is receiving IV antibiotics and attempting to ween oxygen from 6-9L [place] (no O2 use at home). He has increasing smonlence and decreased ability to participate in PT/OT therapies.       PMT was consulted to discuss code status and review GOC.  Summary of counseling/coordination of care: After reviewing patient's chart and assessing patient, I spoke with Derek [name], patient's wife. Discussed continued decline in patient's functional, physical, cognitive, and nutritional abilities. Wife continues to remain hopeful that patient will get stronger.  She says he seems to have "given up". Discussed tht patient's dementia has likely progressed and that he does not hasve the cognitive capacity to continue to participate in therapies.  Discussed that pt is not a candidate for SNF/rehab if he continues to not be able to participate in PT/OT in hospital. Introduced concept of  hospice at home and hospice IPU. Reviewed that if patient continues to not eat/drink, improve in functional ability, then his prognosis remains very poor. Wife appreicate my honesty but is still wanting to remain hopeful that husband will recover.   She did ask appropriate questions regarding hospice. She does not want to make any changes to his plan of care today. However, she is going to speak with her family about plans moving forward.  PMT will continue to follow the patient throughout his hospitalization.   Code Status: DNR  Prognosis: < 6 months  Discharge Planning: To Be Determined  Care plan was discussed with patient, patient's wife Derek [name], Archivist  Physical Exam Vitals reviewed.  Constitutional:      General: He is not in acute distress.    Appearance: He is not toxic-appearing.  HENT:     Head: Normocephalic.     Mouth/Throat:     Mouth: Mucous membranes are moist.  Cardiovascular:     Rate and Rhythm: Normal rate.     Pulses: Normal pulses.  Pulmonary:     Effort: Pulmonary effort is normal.     Comments: [place] in place Abdominal:     Palpations: Abdomen is soft.  Musculoskeletal:     Comments: UE and LE weakness  Skin:    General: Skin is warm and dry.  Neurological:     Comments: Whispers yes/no responses, remains with eyes closed and appears to  be sleeping throughout entirety of interaction            Palliative Assessment/Data: 30%    Total Time 35 minutes  Greater than 50%  of this time was spent counseling and coordinating care related to the above assessment and plan.  Thank you for allowing the Palliative Medicine Team to assist in the care of this patient.  [place] Ilsa Iha, FNP-BC Palliative Medicine Team Team Phone # [phone]

## 2022-03-01 NOTE — Progress Notes (Signed)
Pharmacy Antibiotic Note  Derek [name] is a 76 y.o. male who was recently hospitalized and discharged on 02/17/22 who presented to the ED on 5/13 from [facility] d/t tachycardia, hypoxia and lethargy. X-ray showed possible pneumonia. He was started on cefepime at admission. On 5/15 abx were de-escalated to IV Unasyn. Chest X-ray on 5/19 indicated worsening opacities and WBC up to 28.3. Pharmacy has been consulted to broaden abx to vancomycin and Zosyn considering worsening leukocytosis and chest x-ray findings.   Today, vancomycin has been discontinued   Plan: -Zosyn 3.375 g Q8H infuse over 4 hours -Monitor renal function, clinical status   Height: '5\' 7"'$  (170.2 cm) Weight: 69.2 kg (152 lb 8 oz) IBW/kg (Calculated) : 66.1  Temp (24hrs), Avg:99.2 F (37.3 C), Min:98.4 F (36.9 C), Max:100.7 F (38.2 C)  Recent Labs  Lab 02/25/22 0559 02/26/22 0458 02/27/22 0532 02/28/22 0629 03/01/22 0401  WBC 26.5* 28.3* 29.9* 23.0* 22.3*  CREATININE 0.91 0.90 0.95 0.90 0.84     Estimated Creatinine Clearance: 73.2 mL/min (by C-G formula based on SCr of 0.84 mg/dL).    No Known Allergies    Pharmacy has been assisting with dosing of Zosyn   Dosage will likely remain stable at above dose  and need for further dosage adjustment appears unlikely at present.    Will sign off at this time.  Please reconsult if a change in clinical status warrants re-evaluation of dosage.    Thank you for allowing pharmacy to be a part of this patient's care.   Royetta Asal, PharmD, BCPS 03/01/2022 12:16 PM

## 2022-03-01 NOTE — Progress Notes (Signed)
PROGRESS NOTE    Derek [name]  ZOX:[id number] DOB: Jun 19, 1946 DOA: 02/20/2022 PCP: Lavone Orn, MD   Brief Narrative:  76 year old M with PMH of dementia, CAD/CABG, DM-2, HTN, OSA on CPAP, recent hospitalization for left hip fracture after mechanical fall for which he underwent closed reduction with percutaneous pin fixation on 02/12/22 and discharged to SNF on 02/17/22 presented with shortness of breath, fever, poor oral intake, dysphagia, lethargy and hypoxia to 80s and admitted for severe sepsis/respiratory failure/right lower lobe pneumonia concerning for aspiration pneumonia and started on IV antibiotics.  Palliative care consulted for goals of care discussion.  Assessment & Plan:   Severe sepsis: Present on admission Possible aspiration pneumonia Acute respiratory failure with hypoxia -Does not wear supplemental oxygen at home.  Respiratory status is significantly improving over the last couple of days; currently down to 2 L oxygen via nasal cannula.  Wean off as able. -Antibiotics switched to vancomycin and Zosyn on 02/26/2022 considering worsening leukocytosis and chest x-ray findings.  Will DC vancomycin.  Continue Zosyn for now. -Blood and urine cultures negative so far. -Aspiration precautions.  Diet as per SLP recommendations. -Chest x-ray on 02/25/2022 showed worsening bilateral perihilar and right basilar opacities  Goals of care Physical deconditioning Decreased oral intake/moderate malnutrition -Palliative care evaluated the patient during this hospitalization.  Patient remains full code.  Overall prognosis is guarded to poor.   -CODE STATUS has been switched to DNR from 02/26/2022 onwards.  Wife had stated on 02/28/2022 that patient has shown some improvement in oral intake over the last couple of days.  dysphagia/odynophagia -Continue diet as per SLP recommendations.  Aspiration precaution.  Overall  oral intake is slightly better but still not that good  yet.  Dementia without behavioral disturbance -As per patient's wife, patient's cognition has been declining lately. -Fall/delirium precautions.  Avoid or minimize sedating medications  Recent closed left hip fracture -Status post surgical intervention on 02/12/2022.  Outpatient follow-up with orthopedics.  Weightbearing as tolerated.  Patient will need SNF placement after discharge.  Continue PT eval -Continue Lovenox for DVT prophylaxis while inpatient and switch to full dose aspirin on discharge  Leukocytosis -Slightly improving to 22.3.  Antibiotics plan as above.  We will hold off on further investigations including blood cultures/CAT scan.  Anemia of chronic disease -Hemoglobin currently stable.  Monitor intermittently  Hypernatremia -Possibly from poor intake and dehydration.  Improved.  Continue IV fluids with dextrose.  Repeat a.m. labs  Elevated LFTs -Mildly elevated LFTs, improving.  Questionable cause.  Monitor.  Rheumatoid arthritis -Methotrexate on hold  Benign essential hypertension -Blood pressure on the lower side.  Continue metoprolol.  Lasix on hold  CAD status post CABG in 2019 -No chest pain.  Outpatient follow-up with cardiology.  Continue metoprolol  Depression -Fluoxetine held because of mental status and poor oral intake.  Diabetes mellitus type 2 -A1c 5.1.  Jardiance, metformin on hold continue CBGs with SSI  Hyperlipidemia -Crestor on hold because of elevated LFTs  OSA on CPAP -Continue nightly CPAP  Hypokalemia -Continue replacement with IV fluids.    DVT prophylaxis: lovenox Code Status: DNR  family Communication: wife at bedside on 02/28/2022 Disposition Plan: Status is: Inpatient Remains inpatient appropriate because: of severity of illness    Consultants: Palliative care  Procedures: None  Antimicrobials:  Anti-infectives (From admission, onward)    Start     Dose/Rate Route Frequency Ordered Stop   02/26/22 1100   piperacillin-tazobactam (ZOSYN) IVPB 3.375 g  3.375 g 12.5 mL/hr over 240 Minutes Intravenous Every 8 hours 02/26/22 1013     02/26/22 1100  vancomycin (VANCOREADY) IVPB 1500 mg/300 mL        1,500 mg 150 mL/hr over 120 Minutes Intravenous Every 24 hours 02/26/22 1013     02/22/22 0800  Ampicillin-Sulbactam (UNASYN) 3 g in sodium chloride 0.9 % 100 mL IVPB  Status:  Discontinued        3 g 200 mL/hr over 30 Minutes Intravenous Every 6 hours 02/22/22 0707 02/26/22 0946   02/21/22 1800  ceFEPIme (MAXIPIME) 2 g in sodium chloride 0.9 % 100 mL IVPB  Status:  Discontinued        2 g 200 mL/hr over 30 Minutes Intravenous Every 8 hours 02/21/22 1210 02/22/22 0707   02/21/22 1000  ceFEPIme (MAXIPIME) 2 g in sodium chloride 0.9 % 100 mL IVPB  Status:  Discontinued        2 g 200 mL/hr over 30 Minutes Intravenous Every 12 hours 02/20/22 1329 02/20/22 1746   02/21/22 1000  metroNIDAZOLE (FLAGYL) IVPB 500 mg  Status:  Discontinued        500 mg 100 mL/hr over 60 Minutes Intravenous Every 12 hours 02/21/22 0751 02/22/22 0707   02/20/22 1900  ceFEPIme (MAXIPIME) 2 g in sodium chloride 0.9 % 100 mL IVPB  Status:  Discontinued        2 g 200 mL/hr over 30 Minutes Intravenous Every 12 hours 02/20/22 1746 02/21/22 1210   02/20/22 1345  vancomycin (VANCOREADY) IVPB 1500 mg/300 mL        1,500 mg 150 mL/hr over 120 Minutes Intravenous STAT 02/20/22 1336 02/20/22 1644   02/20/22 1030  cefTRIAXone (ROCEPHIN) 2 g in sodium chloride 0.9 % 100 mL IVPB  Status:  Discontinued        2 g 200 mL/hr over 30 Minutes Intravenous Every 24 hours 02/20/22 1023 02/20/22 1329   02/20/22 1030  azithromycin (ZITHROMAX) 500 mg in sodium chloride 0.9 % 250 mL IVPB  Status:  Discontinued        500 mg 250 mL/hr over 60 Minutes Intravenous Every 24 hours 02/20/22 1023 02/20/22 1329        Subjective: Patient seen and examined at bedside. No seizures, agitation, fever or chest pain reported.  Oral intake still poor as  per nursing staff  objective: Vitals:   02/28/22 1700 02/28/22 2000 03/01/22 0000 03/01/22 0400  BP:      Pulse:      Resp:      Temp: 98.6 F (37 C) 99.1 F (37.3 C) 98.4 F (36.9 C) 98.5 F (36.9 C)  TempSrc: Axillary Oral Axillary Axillary  SpO2:  94% 95% 93%  Weight:      Height:        Intake/Output Summary (Last 24 hours) at 03/01/2022 0734 Last data filed at 03/01/2022 6256 Gross per 24 hour  Intake 902.41 ml  Output 2350 ml  Net -1447.59 ml    Filed Weights   02/20/22 1530  Weight: 69.2 kg    Examination:  General: Currently on 2 L oxygen via nasal cannula.  No acute distress.  Chronically ill and deconditioned looking elderly gentleman lying in bed.   ENT/neck: No palpable thyromegaly or JVD elevation  respiratory: Decreased breath sounds at bases bilaterally with scattered crackles, no wheezing  CVS: S1 and S2 are heard; currently rate controlled abdominal: Soft, nontender, still slightly distended; no organomegaly, bowel sounds are heard  extremities: No  cyanosis; trace lower extremity edema present bilaterally  CNS: Awake, very slow to respond, still confused.  No focal neurologic deficit.  Moves extremities slightly lymph: No palpable cervical lymphadenopathy  skin: No obvious petechiae/ecchymosis psych: No signs of agitation.  Affect is extremely flat.   Musculoskeletal: No obvious joint deformity/tenderness/swelling      Data Reviewed: I have personally reviewed following labs and imaging studies  CBC: Recent Labs  Lab 02/25/22 0559 02/26/22 0458 02/27/22 0532 02/28/22 0629 03/01/22 0401  WBC 26.5* 28.3* 29.9* 23.0* 22.3*  NEUTROABS 20.4* 19.7* 19.0* 13.4* 14.0*  HGB 12.2* 12.0* 11.4* 9.7* 11.1*  HCT 35.8* 36.1* 33.9* 29.1* 34.1*  MCV 99.7 100.8* 100.9* 100.3* 100.3*  PLT 283 306 349 358 407*    Basic Metabolic Panel: Recent Labs  Lab 02/25/22 0559 02/26/22 0458 02/27/22 0532 02/28/22 0629 03/01/22 0401  NA 147* 148* 146* 141 138   K 3.3* 3.3* 3.4* 4.5 3.3*  CL 116* 118* 117* 111 109  CO2 '23 23 24 25 23  '$ GLUCOSE 110* 110* 99 226* 88  BUN '16 17 15 12 12  '$ CREATININE 0.91 0.90 0.95 0.90 0.84  CALCIUM 9.0 9.2 9.0 8.2* 8.3*  MG 2.2 2.1 2.2 1.9 2.0    GFR: Estimated Creatinine Clearance: 73.2 mL/min (by C-G formula based on SCr of 0.84 mg/dL). Liver Function Tests: Recent Labs  Lab 02/23/22 0509 02/24/22 0506 02/25/22 0559 02/26/22 0458 02/28/22 0629  AST 130* 103* 82* 75* 74*  ALT 66* 59* 51* 44 41  ALKPHOS 101 98 113 94 96  BILITOT 1.0 1.1 1.4* 1.1 0.9  PROT 6.0* 5.8* 6.2* 6.3* 6.1*  ALBUMIN 2.3* 2.1* 2.2* 2.2* 1.8*    No results for input(s): LIPASE, AMYLASE in the last 168 hours. Recent Labs  Lab 02/26/22 0458  AMMONIA 33    Coagulation Profile: No results for input(s): INR, PROTIME in the last 168 hours.  Cardiac Enzymes: No results for input(s): CKTOTAL, CKMB, CKMBINDEX, TROPONINI in the last 168 hours.  BNP (last 3 results) No results for input(s): PROBNP in the last 8760 hours. HbA1C: No results for input(s): HGBA1C in the last 72 hours. CBG: Recent Labs  Lab 02/27/22 1612 02/28/22 0729 02/28/22 1135 02/28/22 1607 02/28/22 2139  GLUCAP 116* 86 99 98 90    Lipid Profile: No results for input(s): CHOL, HDL, LDLCALC, TRIG, CHOLHDL, LDLDIRECT in the last 72 hours. Thyroid Function Tests: No results for input(s): TSH, T4TOTAL, FREET4, T3FREE, THYROIDAB in the last 72 hours.  Anemia Panel: No results for input(s): VITAMINB12, FOLATE, FERRITIN, TIBC, IRON, RETICCTPCT in the last 72 hours.  Sepsis Labs: Recent Labs  Lab 02/23/22 0509  PROCALCITON 4.14     Recent Results (from the past 240 hour(s))  Blood Culture (routine x 2)     Status: None   Collection Time: 02/20/22 10:27 AM   Specimen: BLOOD  Result Value Ref Range Status   Specimen Description   Final    BLOOD RIGHT ANTECUBITAL Performed at [facility] [address] [place], [place]  [address]    Special Requests   Final    BOTTLES DRAWN AEROBIC AND ANAEROBIC Blood Culture results may not be optimal due to an excessive volume of blood received in culture bottles Performed at [facility] [address] [place], [place] [address]    Culture   Final    NO GROWTH 5 DAYS Performed at [facility], [place] [address], [place],  [address]  Report Status 02/25/2022 FINAL  Final  Resp Panel by RT-PCR (Flu A&B, Covid) Nasopharyngeal Swab     Status: None   Collection Time: 02/20/22 10:31 AM   Specimen: Nasopharyngeal Swab; Nasopharyngeal(NP) swabs in vial transport medium  Result Value Ref Range Status   SARS Coronavirus 2 by RT PCR NEGATIVE NEGATIVE Final    Comment: (NOTE) SARS-CoV-2 target nucleic acids are NOT DETECTED.  The SARS-CoV-2 RNA is generally detectable in upper respiratory specimens during the acute phase of infection. The lowest concentration of SARS-CoV-2 viral copies this assay can detect is 138 copies/mL. A negative result does not preclude SARS-Cov-2 infection and should not be used as the sole basis for treatment or other patient management decisions. A negative result may occur with  improper specimen collection/handling, submission of specimen other than nasopharyngeal swab, presence of viral mutation(s) within the areas targeted by this assay, and inadequate number of viral copies(<138 copies/mL). A negative result must be combined with clinical observations, patient history, and epidemiological information. The expected result is Negative.  Fact Sheet for Patients:  EntrepreneurPulse.com.au  Fact Sheet for Healthcare Providers:  IncredibleEmployment.be  This test is no t yet approved or cleared by the [place] FDA and  has been authorized for detection and/or diagnosis of SARS-CoV-2 by FDA under an Emergency Use Authorization (EUA). This EUA will remain  in effect (meaning  this test can be used) for the duration of the COVID-19 declaration under Section 564(b)(1) of the Act, 21 U.S.C.section 360bbb-3(b)(1), unless the authorization is terminated  or revoked sooner.       Influenza A by PCR NEGATIVE NEGATIVE Final   Influenza B by PCR NEGATIVE NEGATIVE Final    Comment: (NOTE) The Xpert Xpress SARS-CoV-2/FLU/RSV plus assay is intended as an aid in the diagnosis of influenza from Nasopharyngeal swab specimens and should not be used as a sole basis for treatment. Nasal washings and aspirates are unacceptable for Xpert Xpress SARS-CoV-2/FLU/RSV testing.  Fact Sheet for Patients: EntrepreneurPulse.com.au  Fact Sheet for Healthcare Providers: IncredibleEmployment.be  This test is not yet approved or cleared by the [place] FDA and has been authorized for detection and/or diagnosis of SARS-CoV-2 by FDA under an Emergency Use Authorization (EUA). This EUA will remain in effect (meaning this test can be used) for the duration of the COVID-19 declaration under Section 564(b)(1) of the Act, 21 U.S.C. section 360bbb-3(b)(1), unless the authorization is terminated or revoked.  Performed at [facility], [place] [address], [place], [place] [address]   Blood Culture (routine x 2)     Status: None   Collection Time: 02/20/22 10:40 AM   Specimen: BLOOD  Result Value Ref Range Status   Specimen Description   Final    BLOOD LEFT ANTECUBITAL Performed at [facility] [address], [place], [place] [address]    Special Requests   Final    BOTTLES DRAWN AEROBIC AND ANAEROBIC Blood Culture adequate volume Performed at [facility] [address], [place], [place] [address]    Culture   Final    NO GROWTH 5 DAYS Performed at [facility], [place] [address] [place], [place] [address]    Report Status 02/25/2022 FINAL  Final  MRSA Next Gen by PCR, Nasal      Status: None   Collection Time: 02/20/22  1:09 PM   Specimen: Nasal Mucosa; Nasal Swab  Result Value Ref Range Status   MRSA by PCR Next Gen NOT DETECTED NOT  DETECTED Final    Comment: (NOTE) The GeneXpert MRSA Assay (FDA approved for NASAL specimens only), is one component of a comprehensive MRSA colonization surveillance program. It is not intended to diagnose MRSA infection nor to guide or monitor treatment for MRSA infections. Test performance is not FDA approved in patients less than 37 years old. Performed at [facility], [place] [address] [place], [place] [address]   Urine Culture     Status: None   Collection Time: 02/20/22  2:46 PM   Specimen: In/Out Cath Urine  Result Value Ref Range Status   Specimen Description   Final    IN/OUT CATH URINE Performed at [facility] [address] [place], [place] [address]    Special Requests   Final    NONE Performed at [facility], [place] [address], [place], [place] [address]    Culture   Final    NO GROWTH Performed at [facility], [place] [address], [place], [place] [address]    Report Status 02/22/2022 FINAL  Final          Radiology Studies: No results found.      Scheduled Meds:  docusate sodium  100 mg Oral BID   feeding supplement  237 mL Oral TID BM   folic acid-pyridoxine-cyancobalamin  1 tablet Oral Daily   guaiFENesin  600 mg Oral BID   insulin aspart  0-9 Units Subcutaneous TID WC   metoprolol tartrate  12.5 mg Oral BID   polyethylene glycol  17 g Oral Daily   Continuous Infusions:  sodium chloride Stopped (02/27/22 1257)   dextrose 5 % lactated ringers with kcl 50 mL/hr at 02/28/22 1828   piperacillin-tazobactam (ZOSYN)  IV 3.375 g (03/01/22 0328)   vancomycin Stopped (02/28/22 1146)          Aline August, MD [facility] 03/01/2022, 7:34 AM

## 2022-03-02 ENCOUNTER — Inpatient Hospital Stay (HOSPITAL_COMMUNITY): Payer: No Typology Code available for payment source

## 2022-03-02 DIAGNOSIS — J9601 Acute respiratory failure with hypoxia: Secondary | ICD-10-CM | POA: Diagnosis not present

## 2022-03-02 DIAGNOSIS — A419 Sepsis, unspecified organism: Secondary | ICD-10-CM | POA: Diagnosis not present

## 2022-03-02 DIAGNOSIS — F0390 Unspecified dementia without behavioral disturbance: Secondary | ICD-10-CM | POA: Diagnosis not present

## 2022-03-02 DIAGNOSIS — J690 Pneumonitis due to inhalation of food and vomit: Secondary | ICD-10-CM | POA: Diagnosis not present

## 2022-03-02 DIAGNOSIS — R6520 Severe sepsis without septic shock: Secondary | ICD-10-CM | POA: Diagnosis not present

## 2022-03-02 LAB — CBC WITH DIFFERENTIAL/PLATELET
Abs Immature Granulocytes: 0.23 10*3/uL — ABNORMAL HIGH (ref 0.00–0.07)
Basophils Absolute: 0.1 10*3/uL (ref 0.0–0.1)
Basophils Relative: 1 %
Eosinophils Absolute: 0.1 10*3/uL (ref 0.0–0.5)
Eosinophils Relative: 1 %
HCT: 34.5 % — ABNORMAL LOW (ref 39.0–52.0)
Hemoglobin: 11.7 g/dL — ABNORMAL LOW (ref 13.0–17.0)
Immature Granulocytes: 1 %
Lymphocytes Relative: 26 %
Lymphs Abs: 5.1 10*3/uL — ABNORMAL HIGH (ref 0.7–4.0)
MCH: 33.2 pg (ref 26.0–34.0)
MCHC: 33.9 g/dL (ref 30.0–36.0)
MCV: 98 fL (ref 80.0–100.0)
Monocytes Absolute: 2.2 10*3/uL — ABNORMAL HIGH (ref 0.1–1.0)
Monocytes Relative: 12 %
Neutro Abs: 11.4 10*3/uL — ABNORMAL HIGH (ref 1.7–7.7)
Neutrophils Relative %: 59 %
Platelets: 384 10*3/uL (ref 150–400)
RBC: 3.52 MIL/uL — ABNORMAL LOW (ref 4.22–5.81)
RDW: 15.2 % (ref 11.5–15.5)
WBC: 19.2 10*3/uL — ABNORMAL HIGH (ref 4.0–10.5)
nRBC: 0 % (ref 0.0–0.2)

## 2022-03-02 LAB — BASIC METABOLIC PANEL
Anion gap: 5 (ref 5–15)
BUN: 11 mg/dL (ref 8–23)
CO2: 23 mmol/L (ref 22–32)
Calcium: 8.3 mg/dL — ABNORMAL LOW (ref 8.9–10.3)
Chloride: 108 mmol/L (ref 98–111)
Creatinine, Ser: 0.85 mg/dL (ref 0.61–1.24)
GFR, Estimated: >60 mL/min (ref >60)
Glucose, Bld: 86 mg/dL (ref 70–99)
Potassium: 3.6 mmol/L (ref 3.5–5.1)
Sodium: 136 mmol/L (ref 135–145)

## 2022-03-02 LAB — GLUCOSE, CAPILLARY
Glucose-Capillary: 93 mg/dL (ref 70–99)
Glucose-Capillary: 103 mg/dL — ABNORMAL HIGH (ref 70–99)
Glucose-Capillary: 92 mg/dL (ref 70–99)
Glucose-Capillary: 101 mg/dL — ABNORMAL HIGH (ref 70–99)

## 2022-03-02 LAB — MAGNESIUM: Magnesium: 2 mg/dL (ref 1.7–2.4)

## 2022-03-02 MED ORDER — PROSOURCE PLUS PO LIQD
30.0000 mL | Freq: Two times a day (BID) | ORAL | Status: DC
  Administered 2022-03-03 – 2022-03-05 (×5): 30 mL via ORAL
  Filled 2022-03-02 (×5): qty 30

## 2022-03-02 NOTE — Progress Notes (Signed)
Nutrition Follow-up  DOCUMENTATION CODES:   Non-severe (moderate) malnutrition in context of chronic illness  INTERVENTION:   -Ensure Plus High Protein po BID, each supplement provides 350 kcal and 20 grams of protein.   -Prosource Plus PO BID, each provides 100 kcals and 15g protein  NUTRITION DIAGNOSIS:   Moderate Malnutrition related to chronic illness (dementia) as evidenced by moderate fat depletion, moderate muscle depletion.  Ongoing.  GOAL:   Patient will meet greater than or equal to 90% of their needs  Not meeting.  MONITOR:   PO intake, Supplement acceptance, Labs, Weight trends, I & O's  ASSESSMENT:   76 year old M with PMH of dementia, CAD/CABG, DM-2, HTN, OSA on CPAP, recent hospitalization for left hip fracture after mechanical fall for which he underwent closed reduction with percutaneous pin fixation on 5/5 and discharged to SNF on 5/10 returning with shortness of breath, fever, poor p.o. intake, solid dysphagia, lethargy and hypoxia to 80s, and admitted for severe sepsis with acute hypoxic respiratory failure due to RLL pneumonia.  Pt very lethargic. Alert/oriented x 1. Currently consuming 25% of full liquid meals. SLP to assess again. Mental status/fatigue has been a major barrier. Accepting at least 2 Ensures daily. Will add Prosource supplements for added protein.  Admission weight: 152 lbs.  Medications: Colace, FOLTX, Miralax  Labs reviewed: CBGs: 80-108  Diet Order:   Diet Order             Diet full liquid Room service appropriate? Yes; Fluid consistency: Thin  Diet effective now                   EDUCATION NEEDS:   Not appropriate for education at this time  Skin:  Skin Integrity Issues:: Stage I, Stage II Stage II: thigh, left buttocks Incisions: 5/5 left hip  Last BM:  5/22  Height:   Ht Readings from Last 1 Encounters:  02/20/22 '5\' 7"'$  (1.702 m)    Weight:   Wt Readings from Last 1 Encounters:  02/20/22 69.2 kg     BMI:  Body mass index is 23.88 kg/m.  Estimated Nutritional Needs:   Kcal:  1800-2000  Protein:  85-100g  Fluid:  2L/day  Clayton Bibles, MS, RD, LDN Inpatient Clinical Dietitian Contact information available via Amion

## 2022-03-02 NOTE — Progress Notes (Signed)
PROGRESS NOTE  Derek [name] NFA:[id number] DOB: 1946-09-01 DOA: 02/20/2022 PCP: Lavone Orn, MD   LOS: 10 days   Brief Narrative / Interim history: 76 year old M with PMH of dementia, CAD/CABG, DM-2, HTN, OSA on CPAP, recent hospitalization for left hip fracture after mechanical fall for which he underwent closed reduction with percutaneous pin fixation on 02/12/22 and discharged to SNF on 02/17/22 presented with shortness of breath, fever, poor oral intake, dysphagia, lethargy and hypoxia to 80s and admitted for severe sepsis/respiratory failure/right lower lobe pneumonia concerning for aspiration pneumonia and started on IV antibiotics.  Palliative care consulted for goals of care discussion.  Subjective / 24h Interval events: Appears very weak this morning.  He has no complaints  Assesement and Plan: Principal Problem:   Severe sepsis due to aspiration pneumonia Active Problems:   Acute respiratory failure with hypoxia (HCC)   Aspiration pneumonia (HCC)   Goals of care, counseling/discussion   Closed left hip fracture (HCC)   Dementia without behavioral disturbance (HCC)   Dysphagia and odynophagia   Decreased oral intake   Elevated liver enzymes   History of coronary artery bypass graft   OSA on CPAP   Controlled NIDDM-2 with hyperlipidemia   CAD s/p CABG in 2019 at [facility]   Benign essential hypertension   Rheumatoid arthritis (HCC)   Malnutrition of moderate degree   Hypokalemia and hypophosphatemia   Hyperbilirubinemia   Hypokalemia   Hypophosphatemia   Principal problem Severe sepsis due to possible aspiration pneumonia, acute respiratory failure with hypoxia -patient was admitted to the hospital with respiratory failure thought to be due to aspiration pneumonia.  He is currently on 2 L, wean off as able.  He was initially placed on Unasyn, but given worsening leukocytosis and worsening chest x-ray he was switched to Zosyn on 5/19.  Continue for now.  Leukocytosis  improving.  Cultures negative so far  Active problems Goals of care, physical deconditioning, decreased oral intake/moderate malnutrition -Palliative care evaluated the patient during this hospitalization.  He is currently DNR.  Prognosis appears to be quite poor especially given his decline for the past several weeks that resulted in his fall and hip fracture 2 weeks ago.  Dysphagia/odynophagia -Continue diet as per SLP recommendations.  Aspiration precaution.  Overall  oral intake is slightly better but still not that good yet.   Dementia without behavioral disturbance -As per patient's wife, patient's cognition has been declining lately.  Recent closed left hip fracture -Status post surgical intervention on 02/12/2022 by Dr Lucia Gaskins. Continue Lovenox for DVT prophylaxis while inpatient and switch to full dose aspirin on discharge   Leukocytosis-continues to improve today.  Continue antibiotics   Anemia of chronic disease -Hemoglobin currently stable.  Monitor intermittently   Hypernatremia -resolved.  Give a trial off IV fluids today   Elevated LFTs -Mildly elevated LFTs, improving.  Repeat in the morning   Rheumatoid arthritis -Methotrexate on hold   Benign essential hypertension -Lasix on hold, continue metoprolol.  Blood pressure still on the soft side  CAD status post CABG in 2019 -No chest pain.  Outpatient follow-up with cardiology.  Continue metoprolol  Depression -Fluoxetine held because of mental status and poor oral intake.   Diabetes mellitus type 2 -A1c 5.1.  Jardiance, metformin on hold continue CBGs with SSI   Hyperlipidemia -Crestor on hold because of elevated LFTs   OSA on CPAP -Continue nightly CPAP   Hypokalemia -potassium normal at 3.6 this morning.  Recheck periodically  Scheduled Meds:  docusate  sodium  100 mg Oral BID   feeding supplement  237 mL Oral TID BM   folic acid-pyridoxine-cyancobalamin  1 tablet Oral Daily   guaiFENesin  600 mg Oral BID   insulin  aspart  0-9 Units Subcutaneous TID WC   metoprolol tartrate  12.5 mg Oral BID   polyethylene glycol  17 g Oral Daily   Continuous Infusions:  sodium chloride Stopped (02/27/22 1257)   dextrose 5 % lactated ringers with kcl 50 mL/hr at 03/01/22 1549   piperacillin-tazobactam (ZOSYN)  IV 3.375 g (03/02/22 0315)   PRN Meds:.sodium chloride, acetaminophen **OR** acetaminophen, HYDROcodone-acetaminophen, lip balm, ondansetron (ZOFRAN) IV, senna-docusate, sodium phosphate  Diet Orders (From admission, onward)     Start     Ordered   02/26/22 1812  Diet full liquid Room service appropriate? Yes; Fluid consistency: Thin  Diet effective now       Comments: Ok to have un-thickened ensure and to have thin soup if spoon fed.  Question Answer Comment  Room service appropriate? Yes   Fluid consistency: Thin      02/26/22 1812            DVT prophylaxis: SCDs Start: 02/20/22 1530   Lab Results  Component Value Date   PLT 384 03/02/2022      Code Status: DNR  Family Communication: wife at bedside   Status is: Inpatient  Remains inpatient appropriate because: IV antibiotics, leukocytosis   Level of care: Progressive  Consultants:   Procedures:  none  Objective: Vitals:   03/01/22 1800 03/01/22 2105 03/01/22 2200 03/02/22 0300  BP:  98/64 (!) 88/59 111/71  Pulse:  81    Resp:  '20 19 18  '$ Temp: 97.8 F (36.6 C) 98.9 F (37.2 C)  98.8 F (37.1 C)  TempSrc:  Axillary  Axillary  SpO2:      Weight:      Height:        Intake/Output Summary (Last 24 hours) at 03/02/2022 1010 Last data filed at 03/02/2022 0500 Gross per 24 hour  Intake 1215.3 ml  Output 1200 ml  Net 15.3 ml   Wt Readings from Last 3 Encounters:  02/20/22 69.2 kg  02/11/22 75.8 kg  06/16/18 82.7 kg    Examination:  Constitutional: NAD Eyes: no scleral icterus ENMT: Mucous membranes are moist.  Neck: normal, supple Respiratory: Shallow respirations, no wheezing Cardiovascular: Regular rate  and rhythm, no murmurs / rubs / gallops.  Abdomen: non distended, no tenderness. Bowel sounds positive.  Musculoskeletal: no clubbing / cyanosis.  Skin: no rashes Neurologic: Generalized weakness present, no focal deficits  Data Reviewed: I have independently reviewed following labs and imaging studies   CBC Recent Labs  Lab 02/26/22 0458 02/27/22 0532 02/28/22 0629 03/01/22 0401 03/02/22 0448  WBC 28.3* 29.9* 23.0* 22.3* 19.2*  HGB 12.0* 11.4* 9.7* 11.1* 11.7*  HCT 36.1* 33.9* 29.1* 34.1* 34.5*  PLT 306 349 358 407* 384  MCV 100.8* 100.9* 100.3* 100.3* 98.0  MCH 33.5 33.9 33.4 32.6 33.2  MCHC 33.2 33.6 33.3 32.6 33.9  RDW 14.7 15.2 15.3 15.2 15.2  LYMPHSABS 5.2* 6.5* 6.5* 5.8* 5.1*  MONOABS 2.3* 2.5* 1.9* 1.8* 2.2*  EOSABS 0.3 0.5 0.3 0.2 0.1  BASOSABS 0.1 0.3* 0.2* 0.2* 0.1    Recent Labs  Lab 02/24/22 0506 02/25/22 0559 02/26/22 0458 02/27/22 0532 02/28/22 0629 03/01/22 0401 03/02/22 0448  NA 144 147* 148* 146* 141 138 136  K 3.5 3.3* 3.3* 3.4* 4.5 3.3* 3.6  CL  113* 116* 118* 117* 111 109 108  CO2 '23 23 23 24 25 23 23  '$ GLUCOSE 128* 110* 110* 99 226* 88 86  BUN '16 16 17 15 12 12 11  '$ CREATININE 0.81 0.91 0.90 0.95 0.90 0.84 0.85  CALCIUM 8.8* 9.0 9.2 9.0 8.2* 8.3* 8.3*  AST 103* 82* 75*  --  74*  --   --   ALT 59* 51* 44  --  41  --   --   ALKPHOS 98 113 94  --  96  --   --   BILITOT 1.1 1.4* 1.1  --  0.9  --   --   ALBUMIN 2.1* 2.2* 2.2*  --  1.8*  --   --   MG 1.9 2.2 2.1 2.2 1.9 2.0 2.0  TSH  --   --  0.221*  --   --   --   --   AMMONIA  --   --  33  --   --   --   --     ------------------------------------------------------------------------------------------------------------------ No results for input(s): CHOL, HDL, LDLCALC, TRIG, CHOLHDL, LDLDIRECT in the last 72 hours.  Lab Results  Component Value Date   HGBA1C 5.4 02/12/2022    ------------------------------------------------------------------------------------------------------------------ No results for input(s): TSH, T4TOTAL, T3FREE, THYROIDAB in the last 72 hours.  Invalid input(s): FREET3  Cardiac Enzymes No results for input(s): CKMB, TROPONINI, MYOGLOBIN in the last 168 hours.  Invalid input(s): CK ------------------------------------------------------------------------------------------------------------------    Component Value Date/Time   BNP 210.3 (H) 02/20/2022 1017    CBG: Recent Labs  Lab 03/01/22 0742 03/01/22 1130 03/01/22 1606 03/01/22 2314 03/02/22 0730  GLUCAP 80 85 108* 92 93    Recent Results (from the past 240 hour(s))  Blood Culture (routine x 2)     Status: None   Collection Time: 02/20/22 10:27 AM   Specimen: BLOOD  Result Value Ref Range Status   Specimen Description   Final    BLOOD RIGHT ANTECUBITAL Performed at [facility], [place] [address] [place], [place] [address]    Special Requests   Final    BOTTLES DRAWN AEROBIC AND ANAEROBIC Blood Culture results may not be optimal due to an excessive volume of blood received in culture bottles Performed at [place] [address], [place], [place] [address]    Culture   Final    NO GROWTH 5 DAYS Performed at [facility], [place] [address], [place], [place] [address]    Report Status 02/25/2022 FINAL  Final  Resp Panel by RT-PCR (Flu A&B, Covid) Nasopharyngeal Swab     Status: None   Collection Time: 02/20/22 10:31 AM   Specimen: Nasopharyngeal Swab; Nasopharyngeal(NP) swabs in vial transport medium  Result Value Ref Range Status   SARS Coronavirus 2 by RT PCR NEGATIVE NEGATIVE Final    Comment: (NOTE) SARS-CoV-2 target nucleic acids are NOT DETECTED.  The SARS-CoV-2 RNA is generally detectable in upper respiratory specimens during the acute phase of infection. The lowest concentration of SARS-CoV-2 viral copies this  assay can detect is 138 copies/mL. A negative result does not preclude SARS-Cov-2 infection and should not be used as the sole basis for treatment or other patient management decisions. A negative result may occur with  improper specimen collection/handling, submission of specimen other than nasopharyngeal swab, presence of viral mutation(s) within the areas targeted by this assay, and inadequate number of viral copies(<138 copies/mL). A negative result must be combined with clinical observations, patient  history, and epidemiological information. The expected result is Negative.  Fact Sheet for Patients:  EntrepreneurPulse.com.au  Fact Sheet for Healthcare Providers:  IncredibleEmployment.be  This test is no t yet approved or cleared by the [place] FDA and  has been authorized for detection and/or diagnosis of SARS-CoV-2 by FDA under an Emergency Use Authorization (EUA). This EUA will remain  in effect (meaning this test can be used) for the duration of the COVID-19 declaration under Section 564(b)(1) of the Act, 21 U.S.C.section 360bbb-3(b)(1), unless the authorization is terminated  or revoked sooner.       Influenza A by PCR NEGATIVE NEGATIVE Final   Influenza B by PCR NEGATIVE NEGATIVE Final    Comment: (NOTE) The Xpert Xpress SARS-CoV-2/FLU/RSV plus assay is intended as an aid in the diagnosis of influenza from Nasopharyngeal swab specimens and should not be used as a sole basis for treatment. Nasal washings and aspirates are unacceptable for Xpert Xpress SARS-CoV-2/FLU/RSV testing.  Fact Sheet for Patients: EntrepreneurPulse.com.au  Fact Sheet for Healthcare Providers: IncredibleEmployment.be  This test is not yet approved or cleared by the [place] FDA and has been authorized for detection and/or diagnosis of SARS-CoV-2 by FDA under an Emergency Use Authorization (EUA). This EUA will  remain in effect (meaning this test can be used) for the duration of the COVID-19 declaration under Section 564(b)(1) of the Act, 21 U.S.C. section 360bbb-3(b)(1), unless the authorization is terminated or revoked.  Performed at [facility], [place] [address] [place], [place] [address]   Blood Culture (routine x 2)     Status: None   Collection Time: 02/20/22 10:40 AM   Specimen: BLOOD  Result Value Ref Range Status   Specimen Description   Final    BLOOD LEFT ANTECUBITAL Performed at [place] [address], [place], [place] [address]    Special Requests   Final    BOTTLES DRAWN AEROBIC AND ANAEROBIC Blood Culture adequate volume Performed at [place] [address], [place], [place] [address]    Culture   Final    NO GROWTH 5 DAYS Performed at [facility], [place] [address] [place], [place] [address]    Report Status 02/25/2022 FINAL  Final  MRSA Next Gen by PCR, Nasal     Status: None   Collection Time: 02/20/22  1:09 PM   Specimen: Nasal Mucosa; Nasal Swab  Result Value Ref Range Status   MRSA by PCR Next Gen NOT DETECTED NOT DETECTED Final    Comment: (NOTE) The GeneXpert MRSA Assay (FDA approved for NASAL specimens only), is one component of a comprehensive MRSA colonization surveillance program. It is not intended to diagnose MRSA infection nor to guide or monitor treatment for MRSA infections. Test performance is not FDA approved in patients less than 73 years old. Performed at [facility], [place] [address], [place], [place] [address]   Urine Culture     Status: None   Collection Time: 02/20/22  2:46 PM   Specimen: In/Out Cath Urine  Result Value Ref Range Status   Specimen Description   Final    IN/OUT CATH URINE Performed at [place] [address], [place], [place] [address]    Special Requests   Final    NONE Performed at [facility], [place] [address], [place], [place] [address]    Culture   Final    NO GROWTH Performed at [facility],  [address] [place], [place] [address]    Report Status 02/22/2022 FINAL  Final     Radiology Studies: No results found.   Marzetta Board, MD, PhD [facility]  Between 7 am - 7 pm I am available, please contact me via Amion (for emergencies) or Securechat (non urgent messages)  Between 7 pm - 7 am I am not available, please contact night coverage MD/APP via Amion

## 2022-03-02 NOTE — Progress Notes (Signed)
Palliative Care Progress Note, Assessment & Plan   Patient Name: Derek [name]       Date: 03/02/2022 DOB: December 14, 1945  Age: 76 y.o. MRN#: [id number] Attending Physician: Caren Griffins, MD Primary Care Physician: Lavone Orn, MD Admit Date: 02/20/2022  Reason for Consultation/Follow-up: Establishing goals of care  Subjective: Patient is sitting in bed in no apparent distress.  He does not awaken, acknowledge my presence, and is not able to make his wishes known.  He responds to physical stimuli by opening his eyes and then quickly closing them.  Wife is at bedside attempting to get patient to drink some iced tea.  HPI: 76 y.o. male  with past medical history of dementia, CAD/CABG, DM2, HTN, OSA (CPAP) and left hip fracture with pin 02/12/22 (d/c 02/16/22 to SNF) admitted on 02/20/2022 with RLL PNA. Pt was having SOB, fever, and poor PO intake at home. He is receiving IV antibiotics and attempting to ween oxygen from 6-9L [place] (no O2 use at home). He has increasing smonlence and decreased ability to participate in PT/OT therapies.       PMT was consulted to discuss code status and review GOC.  Summary of counseling/coordination of care: After reviewing the patient's chart and assessing the patient at bedside, I again spoke with patient's wife about diagnosis and prognosis.  I again reviewed that patient's functional, cognitive, and nutritional status continued to decline despite our best efforts of encouragement and therapies.  Wife continues to say she wants to take it 1 day at a time and that it is in God's hands.  She shares he certainly is not himself since he used to be a man that talked a lot and now he is barely speaking a few words.  She continues to focus on the idea that if he eats and is able to  get up and walk and he will get better.  I gently tried to have her understand that he is unable to do at that this time but she continues to want to defer larger discussions to a later date.  She again referenced speaking with me and her family at the same time.  I again offered to set up a goals of care/family meeting at her family's convenience.  She said she would be in touch with me sometime tomorrow.  As discussed yesterday, I gave patient's wife a copy of Hard choices for loving people.  Encouraged her to read the sections regarding palliative and hospice care as they may apply to her husband's current health status.  DNR remains.  Goal continues to be for patient to rehab despite his continuing decline.  PMT will continue to be available to the patient throughout his hospitalization.  I counseled with patient's nurse who shares that wife continues to feed patient liquids that are not nectar thick.  Discussed continued education and encouragement for wife to follow parameters of recommended diet to avoid aspiration.  Code Status: DNR  Prognosis: < 6 months  Discharge Planning: To Be Determined  Recommendations/Plan: Ongoing discussion with wife in regards to patient's continued decline - continue goals of care discussions  Care plan was discussed with patient, patient's wife, RN Anderson [place]  Physical  Exam Vitals reviewed.  Constitutional:      General: He is not in acute distress.    Appearance: He is not toxic-appearing.  HENT:     Head: Normocephalic.     Mouth/Throat:     Mouth: Mucous membranes are moist.  Cardiovascular:     Rate and Rhythm: Normal rate.     Pulses: Normal pulses.  Pulmonary:     Effort: Pulmonary effort is normal.  Abdominal:     Palpations: Abdomen is soft.  Skin:    General: Skin is warm and dry.     Comments: Surgical staples to left hip - clean, dry, no erythema  Neurological:     Comments: nonverbal            Palliative Assessment/Data:   20%    Total Time 25 minutes  Greater than 50%  of this time was spent counseling and coordinating care related to the above assessment and plan.  Thank you for allowing the Palliative Medicine Team to assist in the care of this patient.  Kismet Ilsa Iha, FNP-BC Palliative Medicine Team Team Phone # [phone]

## 2022-03-02 NOTE — Progress Notes (Signed)
Occupational Therapy Treatment Patient Details Name: Derek [name] MRN: [id number] DOB: 08-16-1946 Today's Date: 03/02/2022   History of present illness Derek [name] is a 76 y.o. male with medical history significant of CAD s/p CABG, DM2, HTN, OSA on CPAP, mild cognitive impairment. Presenting with dyspnea and fever. Recently admitted for hip fracture, s/p ORIF. Was sent to SNF for rehab on 5/10. While there he apparently became dyspneic and febrile. admitted with RLL pna.   OT comments  Patient unable to show any significant progress compared to previous session, but did engage a little in self care and therapeutic exercises at bed level. EOB and OOB activities contraindicated by RN due to low BP/RED MEWS.  Pt washed face with Minimal assist and did not show much AROM during BUE exercises, but did show intact reach laterally and across midline when asked to reach and grasp common ADL items.  Encouraged pt's spouse to try this reach and return activity with him as he was significantly more engaged with this.  Patient remains limited by lethargy and cognitive deficits, profound generalized weakness and decreased activity tolerance with low BP along with deficits noted below. Pt continues to demonstrate guarded rehab potential and could benefit from continued skilled OT to increase safety and independence with ADLs and functional transfers to allow pt to return home safely and reduce caregiver burden and fall risk.    Recommendations for follow up therapy are one component of a multi-disciplinary discharge planning process, led by the attending physician.  Recommendations may be updated based on patient status, additional functional criteria and insurance authorization.    Follow Up Recommendations  Skilled nursing-short term rehab (<3 hours/day) (Will continue to assess)    Assistance Recommended at Discharge Frequent or constant Supervision/Assistance  Patient can return home with the  following  A lot of help with walking and/or transfers;A lot of help with bathing/dressing/bathroom;Assistance with cooking/housework;Direct supervision/assist for medications management;Direct supervision/assist for financial management;Assist for transportation;Help with stairs or ramp for entrance   Equipment Recommendations  None recommended by OT    Recommendations for Other Services      Precautions / Restrictions Precautions Precautions: Fall Precaution Comments: monitor vitals Restrictions Weight Bearing Restrictions: Yes LLE Weight Bearing: Weight bearing as tolerated Other Position/Activity Restrictions: per previous notes recent admission for L hip fx       Mobility Bed Mobility Overal bed mobility:  (Deferred per Nsg. See below)             General bed mobility comments: Worked on 3 attempts for pt to use lower bed rails to pull upper body into long sitting with Max As. No palpation of pt effort until 3rd attempt when OT did feel pt try to pull with UEs and active trunk, but pt endured this <1 second then realsed upper body back to bed.    Transfers                         Balance                                           ADL either performed or assessed with clinical judgement   ADL Overall ADL's : Needs assistance/impaired     Grooming: Bed level;Wash/dry face;Minimal assistance Grooming Details (indicate cue type and reason): Pt wiped mouth and eyes with RT hand after setup  and cues to initiate. Min As to wipe forhead. Pt then dried face and able to reach forehead.                   Toilet Transfer Details (indicate cue type and reason): Bed level only approved by Nrg due to RED MEWS/soft BP                Extremity/Trunk Assessment Upper Extremity Assessment Upper Extremity Assessment: Difficult to assess due to impaired cognition (Inconsistent. Very floppy UEs during HEP but more active movement noted during  liht ADLs.)            Vision   Additional Comments: Keeping eyes closed frequently but opens to cues. Able to track object held in front of him in different plances and reach for objects with cues.   Perception     Praxis      Cognition Arousal/Alertness: Lethargic Behavior During Therapy: Flat affect Overall Cognitive Status: Impaired/Different from baseline Area of Impairment: Orientation, Following commands, Awareness, Attention                 Orientation Level: Disoriented to, Time, Place, Situation (Only verbalized name) Current Attention Level: Focused   Following Commands: Follows one step commands inconsistently   Awareness: Intellectual            Exercises General Exercises - Upper Extremity Shoulder Flexion: AAROM, PROM, Supine, 10 reps, Both Elbow Flexion: Supine, 10 reps, Both, AAROM, PROM Elbow Extension: Both, 10 reps, Supine, AAROM, PROM Digit Composite Flexion: AAROM, PROM, 10 reps, Supine Composite Extension: PROM, AAROM, 10 reps, Supine    Shoulder Instructions       General Comments      Pertinent Vitals/ Pain       Pain Assessment Pain Assessment: Faces Faces Pain Scale: No hurt  Home Living                                          Prior Functioning/Environment              Frequency  Min 2X/week        Progress Toward Goals  OT Goals(current goals can now be found in the care plan section)  Progress towards OT goals: Not progressing toward goals - comment (Remains lethargic with minimal partcipation)  Acute Rehab OT Goals Patient Stated Goal: Per spouse, for pt to work with therapy and eventually get OOB OT Goal Formulation: With family Time For Goal Achievement: 03/08/22 Potential to Achieve Goals:  (Guarded)  Plan Discharge plan remains appropriate    Co-evaluation                 AM-PAC OT "6 Clicks" Daily Activity     Outcome Measure   Help from another person eating meals?:  Total Help from another person taking care of personal grooming?: A Lot Help from another person toileting, which includes using toliet, bedpan, or urinal?: Total Help from another person bathing (including washing, rinsing, drying)?: Total Help from another person to put on and taking off regular upper body clothing?: Total Help from another person to put on and taking off regular lower body clothing?: Total 6 Click Score: 7    End of Session Equipment Utilized During Treatment: Oxygen  OT Visit Diagnosis: Other abnormalities of gait and mobility (R26.89);Pain   Activity Tolerance Patient limited by lethargy;Patient limited by fatigue  Patient Left in bed;with call bell/phone within reach;with family/visitor present   Nurse Communication Other (comment) (Pt's level of arousal and participation with OT)        Time: 5176-1607 OT Time Calculation (min): 17 min  Charges: OT General Charges $OT Visit: 1 Visit OT Treatments $Therapeutic Exercise: 8-22 mins  Anderson Malta, [place] Office: [phone] 03/02/2022  Julien Girt 03/02/2022, 2:35 PM

## 2022-03-03 DIAGNOSIS — R6520 Severe sepsis without septic shock: Secondary | ICD-10-CM | POA: Diagnosis not present

## 2022-03-03 DIAGNOSIS — A419 Sepsis, unspecified organism: Secondary | ICD-10-CM | POA: Diagnosis not present

## 2022-03-03 LAB — GLUCOSE, CAPILLARY
Glucose-Capillary: 72 mg/dL (ref 70–99)
Glucose-Capillary: 108 mg/dL — ABNORMAL HIGH (ref 70–99)
Glucose-Capillary: 189 mg/dL — ABNORMAL HIGH (ref 70–99)
Glucose-Capillary: 138 mg/dL — ABNORMAL HIGH (ref 70–99)

## 2022-03-03 LAB — CBC
HCT: 33.3 % — ABNORMAL LOW (ref 39.0–52.0)
Hemoglobin: 11.2 g/dL — ABNORMAL LOW (ref 13.0–17.0)
MCH: 32.7 pg (ref 26.0–34.0)
MCHC: 33.6 g/dL (ref 30.0–36.0)
MCV: 97.1 fL (ref 80.0–100.0)
Platelets: 397 10*3/uL (ref 150–400)
RBC: 3.43 MIL/uL — ABNORMAL LOW (ref 4.22–5.81)
RDW: 15 % (ref 11.5–15.5)
WBC: 14.2 10*3/uL — ABNORMAL HIGH (ref 4.0–10.5)
nRBC: 0 % (ref 0.0–0.2)

## 2022-03-03 LAB — COMPREHENSIVE METABOLIC PANEL
ALT: 37 U/L (ref 0–44)
AST: 68 U/L — ABNORMAL HIGH (ref 15–41)
Albumin: 1.9 g/dL — ABNORMAL LOW (ref 3.5–5.0)
Alkaline Phosphatase: 87 U/L (ref 38–126)
Anion gap: 8 (ref 5–15)
BUN: 11 mg/dL (ref 8–23)
CO2: 23 mmol/L (ref 22–32)
Calcium: 8.4 mg/dL — ABNORMAL LOW (ref 8.9–10.3)
Chloride: 105 mmol/L (ref 98–111)
Creatinine, Ser: 0.8 mg/dL (ref 0.61–1.24)
GFR, Estimated: >60 mL/min (ref >60)
Glucose, Bld: 76 mg/dL (ref 70–99)
Potassium: 3.5 mmol/L (ref 3.5–5.1)
Sodium: 136 mmol/L (ref 135–145)
Total Bilirubin: 0.7 mg/dL (ref 0.3–1.2)
Total Protein: 7.3 g/dL (ref 6.5–8.1)

## 2022-03-03 LAB — MAGNESIUM: Magnesium: 2 mg/dL (ref 1.7–2.4)

## 2022-03-03 MED ORDER — POTASSIUM CHLORIDE 20 MEQ PO PACK
40.0000 meq | PACK | Freq: Once | ORAL | Status: AC
  Administered 2022-03-03: 40 meq via ORAL
  Filled 2022-03-03: qty 2

## 2022-03-03 MED ORDER — GUAIFENESIN 100 MG/5ML PO LIQD
15.0000 mL | Freq: Four times a day (QID) | ORAL | Status: DC
  Administered 2022-03-03 – 2022-03-05 (×6): 15 mL via ORAL
  Filled 2022-03-03 (×6): qty 20

## 2022-03-03 MED ORDER — DOCUSATE SODIUM 50 MG/5ML PO LIQD
100.0000 mg | Freq: Two times a day (BID) | ORAL | Status: DC
  Administered 2022-03-03 – 2022-03-05 (×4): 100 mg via ORAL
  Filled 2022-03-03 (×4): qty 10

## 2022-03-03 NOTE — TOC Progression Note (Signed)
Transition of Care [facility]) - Progression Note    Patient Details  Name: Derek [name] MRN: [id number] Date of Birth: 1946-01-16  [facility] CM/SW Contact  Rolene Andrades, Juliann Pulse, RN Phone Number: 03/03/2022, 1:40 PM  Clinical Narrative: Zadie Cleverly plan Josem Kaufmann ID# [id number] for return [place] w/palliative care services.     Expected Discharge Plan: [place] (from [facility]) Barriers to Discharge: [employer]  Expected Discharge Plan and Services Expected Discharge Plan: [place] (from [facility])   Discharge Planning Services: CM Consult Post Acute Care Choice: [place] Living arrangements for the past 2 months: Single Family Home                                       Social Determinants of Health (SDOH) Interventions    Readmission Risk Interventions     View : No data to display.

## 2022-03-03 NOTE — Progress Notes (Signed)
Occupational Therapy Treatment Patient Details Name: Derek [name] MRN: [id number] DOB: 1946-06-19 Today's Date: 03/03/2022   History of present illness Derek [name] is a 76 y.o. male with medical history significant of CAD s/p CABG, DM2, HTN, OSA on CPAP, mild cognitive impairment. Presenting with dyspnea and fever. Recently admitted for hip fracture, s/p ORIF. Was sent to SNF for rehab on 5/10. While there he apparently became dyspneic and febrile. admitted with RLL pna.   OT comments  Patient more alert today and wanting to get out of bed. Patient able to state his name and knows he is in the hospital. Patient  on 4 L HFNC. Patient max assist to transfer to edge of bed and had some initial imbalance at edge of bed needing min assist. Sat at EOB approximately 5 minutes and did well with verbal or tactile cues to maintain balance. Patient mod x 2 to stand and transfer to recliner. BP dropped to 97/62 and o2 sat to 85% but patient recovered nicely in chair. Patient encouraged to perform LB movement to work on strengthening lower extremities. Patient and spouse verbalized understanding. Cont POC.   Recommendations for follow up therapy are one component of a multi-disciplinary discharge planning process, led by the attending physician.  Recommendations may be updated based on patient status, additional functional criteria and insurance authorization.    Follow Up Recommendations  Skilled nursing-short term rehab (<3 hours/day)    Assistance Recommended at Discharge    Patient can return home with the following  A lot of help with bathing/dressing/bathroom;Assistance with cooking/housework;Direct supervision/assist for medications management;Direct supervision/assist for financial management;Assist for transportation;Help with stairs or ramp for entrance;Two people to help with walking and/or transfers   Equipment Recommendations  Other (comment) (Defer to next venue)     Recommendations for Other Services      Precautions / Restrictions Precautions Precautions: Fall Precaution Comments: monitor vitals -sat and BP Restrictions Weight Bearing Restrictions: No LLE Weight Bearing: Weight bearing as tolerated Other Position/Activity Restrictions: per previous notes recent admission for L hip fx       Mobility Bed Mobility Overal bed mobility: Needs Assistance Bed Mobility: Supine to Sit     Supine to sit: Max assist, HOB elevated     General bed mobility comments: Max assist to transfer to edge of bed.    Transfers Overall transfer level: Needs assistance Equipment used: Rolling walker (2 wheels) Transfers: Sit to/from Stand, Bed to chair/wheelchair/BSC Sit to Stand: Mod assist, +2 physical assistance, From elevated surface Stand pivot transfers: Mod assist, +2 physical assistance         General transfer comment: Mod x 2 to with patient holding on to walker to stand. Mod x 2 to pivot to recilner. Gave patient time and tactile cues to attempt to take steps but patient unable to take step with LLE.     Balance Overall balance assessment: Needs assistance Sitting-balance support: No upper extremity supported, Feet supported Sitting balance-Leahy Scale: Fair Sitting balance - Comments: Initally min assist due to leaning to the right and at times back - but advanced to min guard   Standing balance support: During functional activity Standing balance-Leahy Scale: Poor Standing balance comment: reliant on external assistance                           ADL either performed or assessed with clinical judgement   ADL  Extremity/Trunk Assessment Upper Extremity Assessment Upper Extremity Assessment: Generalized weakness            Vision   Vision Assessment?: No apparent visual deficits   Perception     Praxis      Cognition Arousal/Alertness:  Awake/alert Behavior During Therapy: WFL for tasks assessed/performed Overall Cognitive Status: Within Functional Limits for tasks assessed                                 General Comments: Alert to self and hospital. Able to softly answer questions.        Exercises      Shoulder Instructions       General Comments      Pertinent Vitals/ Pain       Pain Assessment Pain Assessment: No/denies pain  Home Living                                          Prior Functioning/Environment              Frequency  Min 2X/week        Progress Toward Goals  OT Goals(current goals can now be found in the care plan section)  Progress towards OT goals: Progressing toward goals  Acute Rehab OT Goals Patient Stated Goal: to get to chair OT Goal Formulation: With family Time For Goal Achievement: 03/08/22 Potential to Achieve Goals: [place] Discharge plan remains appropriate    Co-evaluation                 AM-PAC OT "6 Clicks" Daily Activity     Outcome Measure   Help from another person eating meals?: A Lot Help from another person taking care of personal grooming?: A Little Help from another person toileting, which includes using toliet, bedpan, or urinal?: Total Help from another person bathing (including washing, rinsing, drying)?: Total Help from another person to put on and taking off regular upper body clothing?: A Lot Help from another person to put on and taking off regular lower body clothing?: Total 6 Click Score: 10    End of Session Equipment Utilized During Treatment: Oxygen;Rolling walker (2 wheels)  OT Visit Diagnosis: Other abnormalities of gait and mobility (R26.89);Pain   Activity Tolerance Patient tolerated treatment well   Patient Left in chair;with call bell/phone within reach;with chair alarm set   Nurse Communication Mobility status        Time: 9892-1194 OT Time Calculation (min): 21  min  Charges: OT General Charges $OT Visit: 1 Visit OT Treatments $Therapeutic Activity: 8-22 mins  Derl Barrow, OTR/L [place]  Office [phone] Pager: [place] 03/03/2022, 3:17 PM

## 2022-03-03 NOTE — Progress Notes (Signed)
     Derek [name] is a 76 y.o. male   Orthopaedic diagnosis: Status post CRPP of left valgus impacted femoral neck fracture on 02/12/2022  Subjective: Patient resting comfortably.  He has not done much physical therapy up out of the bed per family.  Denies pain in his left hip.  Objectyive: Vitals:   03/03/22 0700 03/03/22 0800  BP: 96/61 101/65  Pulse:    Resp: 17 17  Temp:    SpO2: 95% 96%     Exam: Awake and alert Respirations even and unlabored No acute distress  Left hip with well-healed surgical incisions.  Staples in place.  No significant pain with range of motion of the hip.  Imaging: X-rays of the pelvis and left hip were reviewed.  Stable fixation.  No hardware failure.  Assessment: Just over 2 weeks status post CRPP of left hip fracture   Plan: He seems to be recovering well from his hip fracture.  He is weightbearing as tolerated on the left hip.  Staples can be removed and small dressing placed if needed.  We will plan to see him in the office in 4-6 weeks for repeat x-rays of the left hip.     Radene Journey, MD

## 2022-03-03 NOTE — Progress Notes (Signed)
Staples removed on L hip per order. Skin clean, dry and intact. Pt tolerated well.

## 2022-03-03 NOTE — Progress Notes (Signed)
PROGRESS NOTE  Derek [name] HYI:[id number] DOB: Oct 05, 1946 DOA: 02/20/2022 PCP: Lavone Orn, MD   LOS: 11 days   Brief Narrative / Interim history: 76 year old M with PMH of dementia, CAD/CABG, DM-2, HTN, OSA on CPAP, recent hospitalization for left hip fracture after mechanical fall for which he underwent closed reduction with percutaneous pin fixation on 02/12/22 and discharged to SNF on 02/17/22 presented with shortness of breath, fever, poor oral intake, dysphagia, lethargy and hypoxia to 80s and admitted for severe sepsis/respiratory failure/right lower lobe pneumonia concerning for aspiration pneumonia and started on IV antibiotics.  Palliative care consulted for goals of care discussion.  Subjective / 24h Interval events: Appears more alert today, stronger  Assesement and Plan: Principal Problem:   Severe sepsis due to aspiration pneumonia Active Problems:   Acute respiratory failure with hypoxia (HCC)   Aspiration pneumonia (HCC)   Goals of care, counseling/discussion   Closed left hip fracture (HCC)   Dementia without behavioral disturbance (HCC)   Dysphagia and odynophagia   Decreased oral intake   Elevated liver enzymes   History of coronary artery bypass graft   OSA on CPAP   Controlled NIDDM-2 with hyperlipidemia   CAD s/p CABG in 2019 at [facility]   Benign essential hypertension   Rheumatoid arthritis (HCC)   Malnutrition of moderate degree   Hypokalemia and hypophosphatemia   Hyperbilirubinemia   Hypokalemia   Hypophosphatemia   Principal problem Severe sepsis due to possible aspiration pneumonia, acute respiratory failure with hypoxia -patient was admitted to the hospital with respiratory failure thought to be due to aspiration pneumonia.  He is currently on 2 L, wean off as able.  He was initially placed on Unasyn, but given worsening leukocytosis and worsening chest x-ray he was switched to Zosyn on 5/19.  Continue for now.  WBC improved to 14 K.  Cultures  remain negative so far  Active problems Goals of care, physical deconditioning, decreased oral intake/moderate malnutrition -Palliative care evaluated the patient during this hospitalization.  He is currently DNR.  Prognosis appears to be quite poor especially given his decline for the past several weeks that resulted in his fall and hip fracture 2 weeks ago.  Dysphagia/odynophagia -Continue diet as per SLP recommendations.  Aspiration precaution.  Hopefully he will eat more today as he is more alert   Dementia without behavioral disturbance -As per patient's wife, patient's cognition has been declining lately.  Recent closed left hip fracture -Status post surgical intervention on 02/12/2022 by Dr Lucia Gaskins. Continue Lovenox for DVT prophylaxis while inpatient and switch to full dose aspirin on discharge.  Appreciate orthopedic surgery follow-up, remove staples today   Leukocytosis-continues to improve today.  Continue antibiotics   Anemia of chronic disease -Hemoglobin currently stable.  Monitor intermittently   Hypernatremia -resolved.  Monitor off IV fluids, sodium stable   Elevated LFTs -Mildly elevated LFTs, improving.  Repeat this morning stable   Rheumatoid arthritis -Methotrexate on hold   Benign essential hypertension -Lasix on hold, continue metoprolol.  Blood pressure still on the soft side, asymptomatic  CAD status post CABG in 2019 -No chest pain.  Outpatient follow-up with cardiology.  Continue metoprolol  Depression -Fluoxetine held because of mental status and poor oral intake.   Diabetes mellitus type 2 -A1c 5.1.  Jardiance, metformin on hold continue CBGs with SSI   Hyperlipidemia -Crestor on hold because of elevated LFTs   OSA on CPAP -Continue nightly CPAP   Hypokalemia -potassium normal at 3.5 this morning.  Recheck periodically  Scheduled Meds:  (feeding supplement) PROSource Plus  30 mL Oral BID BM   docusate sodium  100 mg Oral BID   feeding supplement  237 mL  Oral TID BM   folic acid-pyridoxine-cyancobalamin  1 tablet Oral Daily   guaiFENesin  600 mg Oral BID   insulin aspart  0-9 Units Subcutaneous TID WC   metoprolol tartrate  12.5 mg Oral BID   polyethylene glycol  17 g Oral Daily   Continuous Infusions:  sodium chloride Stopped (02/27/22 1257)   piperacillin-tazobactam (ZOSYN)  IV 3.375 g (03/03/22 1004)   PRN Meds:.sodium chloride, acetaminophen **OR** acetaminophen, HYDROcodone-acetaminophen, lip balm, ondansetron (ZOFRAN) IV, senna-docusate, sodium phosphate  Diet Orders (From admission, onward)     Start     Ordered   02/26/22 1812  Diet full liquid Room service appropriate? Yes; Fluid consistency: Thin  Diet effective now       Comments: Ok to have un-thickened ensure and to have thin soup if spoon fed.  Question Answer Comment  Room service appropriate? Yes   Fluid consistency: Thin      02/26/22 1812            DVT prophylaxis: SCDs Start: 02/20/22 1530   Lab Results  Component Value Date   PLT 397 03/03/2022      Code Status: DNR  Family Communication: wife at bedside   Status is: Inpatient  Remains inpatient appropriate because: IV antibiotics, leukocytosis   Level of care: Progressive  Consultants:   Procedures:  none  Objective: Vitals:   03/02/22 2121 03/03/22 0646 03/03/22 0700 03/03/22 0800  BP: 111/64  96/61 101/65  Pulse: 87     Resp: '17 20 17 17  '$ Temp: 97.8 F (36.6 C) 98.7 F (37.1 C)    TempSrc: Oral Oral    SpO2: 95%  95% 96%  Weight:      Height:        Intake/Output Summary (Last 24 hours) at 03/03/2022 1155 Last data filed at 03/03/2022 0900 Gross per 24 hour  Intake 535.67 ml  Output 1700 ml  Net -1164.33 ml    Wt Readings from Last 3 Encounters:  02/20/22 69.2 kg  02/11/22 75.8 kg  06/16/18 82.7 kg    Examination:  Constitutional: NAD Eyes: lids and conjunctivae normal, no scleral icterus ENMT: mmm Neck: normal, supple Respiratory: clear to auscultation  bilaterally, no wheezing, no crackles. Normal respiratory effort.  Cardiovascular: Regular rate and rhythm, no murmurs / rubs / gallops.  Abdomen: soft, no distention, no tenderness. Bowel sounds positive.  Skin: no rashes Neurologic: no focal deficits, equal strength  Data Reviewed: I have independently reviewed following labs and imaging studies   CBC Recent Labs  Lab 02/26/22 0458 02/27/22 0532 02/28/22 0629 03/01/22 0401 03/02/22 0448 03/03/22 0441  WBC 28.3* 29.9* 23.0* 22.3* 19.2* 14.2*  HGB 12.0* 11.4* 9.7* 11.1* 11.7* 11.2*  HCT 36.1* 33.9* 29.1* 34.1* 34.5* 33.3*  PLT 306 349 358 407* 384 397  MCV 100.8* 100.9* 100.3* 100.3* 98.0 97.1  MCH 33.5 33.9 33.4 32.6 33.2 32.7  MCHC 33.2 33.6 33.3 32.6 33.9 33.6  RDW 14.7 15.2 15.3 15.2 15.2 15.0  LYMPHSABS 5.2* 6.5* 6.5* 5.8* 5.1*  --   MONOABS 2.3* 2.5* 1.9* 1.8* 2.2*  --   EOSABS 0.3 0.5 0.3 0.2 0.1  --   BASOSABS 0.1 0.3* 0.2* 0.2* 0.1  --      Recent Labs  Lab 02/25/22 0559 02/26/22 0458 02/27/22 0532 02/28/22 0629 03/01/22 0401  03/02/22 0448 03/03/22 0538  NA 147* 148* 146* 141 138 136 136  K 3.3* 3.3* 3.4* 4.5 3.3* 3.6 3.5  CL 116* 118* 117* 111 109 108 105  CO2 '23 23 24 25 23 23 23  '$ GLUCOSE 110* 110* 99 226* 88 86 76  BUN '16 17 15 12 12 11 11  '$ CREATININE 0.91 0.90 0.95 0.90 0.84 0.85 0.80  CALCIUM 9.0 9.2 9.0 8.2* 8.3* 8.3* 8.4*  AST 82* 75*  --  74*  --   --  68*  ALT 51* 44  --  41  --   --  37  ALKPHOS 113 94  --  96  --   --  87  BILITOT 1.4* 1.1  --  0.9  --   --  0.7  ALBUMIN 2.2* 2.2*  --  1.8*  --   --  1.9*  MG 2.2 2.1 2.2 1.9 2.0 2.0 2.0  TSH  --  0.221*  --   --   --   --   --   AMMONIA  --  33  --   --   --   --   --      ------------------------------------------------------------------------------------------------------------------ No results for input(s): CHOL, HDL, LDLCALC, TRIG, CHOLHDL, LDLDIRECT in the last 72 hours.  Lab Results  Component Value Date   HGBA1C 5.4  02/12/2022   ------------------------------------------------------------------------------------------------------------------ No results for input(s): TSH, T4TOTAL, T3FREE, THYROIDAB in the last 72 hours.  Invalid input(s): FREET3  Cardiac Enzymes No results for input(s): CKMB, TROPONINI, MYOGLOBIN in the last 168 hours.  Invalid input(s): CK ------------------------------------------------------------------------------------------------------------------    Component Value Date/Time   BNP 210.3 (H) 02/20/2022 1017    CBG: Recent Labs  Lab 03/02/22 1124 03/02/22 1657 03/02/22 2146 03/03/22 0726 03/03/22 1120  GLUCAP 103* 92 101* 72 108*     No results found for this or any previous visit (from the past 240 hour(s)).    Radiology Studies: DG HIP UNILAT WITH PELVIS 2-3 VIEWS LEFT  Result Date: 03/02/2022 CLINICAL DATA:  Hip fracture. EXAM: DG HIP (WITH OR WITHOUT PELVIS) 2-3V LEFT COMPARISON:  Left hip radiographs 02/11/2022 FINDINGS: Interval ORIF of the previously seen left proximal femoral subcapital fracture with 3 longitudinal screws. Normal alignment. Moderate left femoroacetabular joint space narrowing. Status post total right hip arthroplasty. No perihardware lucency is seen to indicate hardware failure or loosening. Diffuse decreased bone mineralization. Left hip lateral surgical skin staples. Left hip surgical drain. IMPRESSION: Interval ORIF of the proximal left femur with normal alignment. Electronically Signed   By: Yvonne Kendall M.D.   On: 03/02/2022 13:53     Marzetta Board, MD, PhD [facility]  Between 7 am - 7 pm I am available, please contact me via Amion (for emergencies) or Securechat (non urgent messages)  Between 7 pm - 7 am I am not available, please contact night coverage MD/APP via Amion

## 2022-03-03 NOTE — Progress Notes (Signed)
Speech Language Pathology Treatment: Dysphagia  Patient Details Name: Derek [name] MRN: [id number] DOB: February 02, 1946 Today's Date: 03/03/2022 Time: 2229-7989 SLP Time Calculation (min) (ACUTE ONLY): 40 min  Assessment / Plan / Recommendation Clinical Impression  Today pt seen for skilled SLP to address dysphagia goals.  Pt more alert today and feeding self with hand over hand assist.  Note white coating on tongue - suspect may be oral candidiasis.  Pt winces with each swallow - reports gustatory changes but denies odynophagia.  SLP faciliated session by assisting pt with po intake using hand over hand- Intake observed includes thin water, Ensure, Ice cream and graham cracker bolus.  Prolonged mastication of solids observed with minimal oral retention - which icecream faciliated to clear.  He only accepted a few bites/sips - stating he was "done".  Pt is dysphonic and his current oxygen use is new with this admit.  He is continuing to cough - more with thin liquids more than thicker consistencies.    Pt's wife reports he has coughed with intake for several months prior to injury - and intake has been poor.  Cognitive based oral dysphagia present - for which strategies can compensate however concern for pharyngeal deficits also present.  Will proceed with MBS next date to allow evaluation of anatomoy and physiology of swallow function to determine potential helpful compensation strategies.  Pt and wife agreeable to plan.  Hopefully if pt has oral candidiasis and is treated, his poor intake may improve slightly.  Advanced diet to dys2/nectar with allowance for thin water after mouth care.  Using teach back, pt and wife educated to recommendations and agreeable.  Wife has used oral suction with pt and comfortable independently with its use.    HPI HPI: Pt is a 76 y.o. male who presented with dyspnea and fever. CXR 5/14: New airspace disease in the medial right base compatible with atelectasis or  pneumonia. Per RN note 5/14, pt given coffee and "he began coughing and spit it up along with small amount of thick mucus." PMH: dementia, CAD s/p CABG, DM2, HTN, OSA on CPAP, mild cognitive impairment.  Pt has been on full liquids, nectar - but has been receiving thin liquids.  SLP follow up for dysphagia management, indication for instrumental swallow evaluation.  Pt is s/p palliative meeting with goal of full treatment - code status is now DNR.      SLP Plan  Continue with current plan of care;MBS      Recommendations for follow up therapy are one component of a multi-disciplinary discharge planning process, led by the attending physician.  Recommendations may be updated based on patient status, additional functional criteria and insurance authorization.    Recommendations  Diet recommendations: Dysphagia 2 (fine chop);Nectar-thick liquid (thin water frazier water protocol) Liquids provided via: Cup;Straw Medication Administration: Whole meds with puree Supervision: Full supervision/cueing for compensatory strategies Compensations: Slow rate;Small sips/bites;Minimize environmental distractions;Other (Comment) Postural Changes and/or Swallow Maneuvers: Seated upright 90 degrees;Upright 30-60 min after meal                Oral Care Recommendations: Oral care BID;Staff/trained caregiver to provide oral care Follow Up Recommendations: Other (comment) Assistance recommended at discharge: Frequent or constant Supervision/Assistance SLP Visit Diagnosis: Dysphagia, unspecified (R13.10) Plan: Continue with current plan of care;MBS         [name], MS [place] Office [phone] Pager [phone]   [name]  03/03/2022, 5:10 PM

## 2022-03-04 ENCOUNTER — Inpatient Hospital Stay (HOSPITAL_COMMUNITY): Payer: No Typology Code available for payment source

## 2022-03-04 DIAGNOSIS — R6520 Severe sepsis without septic shock: Secondary | ICD-10-CM | POA: Diagnosis not present

## 2022-03-04 DIAGNOSIS — A419 Sepsis, unspecified organism: Secondary | ICD-10-CM | POA: Diagnosis not present

## 2022-03-04 LAB — GLUCOSE, CAPILLARY
Glucose-Capillary: 105 mg/dL — ABNORMAL HIGH (ref 70–99)
Glucose-Capillary: 115 mg/dL — ABNORMAL HIGH (ref 70–99)
Glucose-Capillary: 140 mg/dL — ABNORMAL HIGH (ref 70–99)
Glucose-Capillary: 111 mg/dL — ABNORMAL HIGH (ref 70–99)

## 2022-03-04 LAB — BASIC METABOLIC PANEL
Anion gap: 5 (ref 5–15)
BUN: 13 mg/dL (ref 8–23)
CO2: 25 mmol/L (ref 22–32)
Calcium: 8.4 mg/dL — ABNORMAL LOW (ref 8.9–10.3)
Chloride: 107 mmol/L (ref 98–111)
Creatinine, Ser: 0.81 mg/dL (ref 0.61–1.24)
GFR, Estimated: >60 mL/min (ref >60)
Glucose, Bld: 112 mg/dL — ABNORMAL HIGH (ref 70–99)
Potassium: 3.7 mmol/L (ref 3.5–5.1)
Sodium: 137 mmol/L (ref 135–145)

## 2022-03-04 LAB — CBC
HCT: 33.7 % — ABNORMAL LOW (ref 39.0–52.0)
Hemoglobin: 11.4 g/dL — ABNORMAL LOW (ref 13.0–17.0)
MCH: 33.2 pg (ref 26.0–34.0)
MCHC: 33.8 g/dL (ref 30.0–36.0)
MCV: 98.3 fL (ref 80.0–100.0)
Platelets: 469 10*3/uL — ABNORMAL HIGH (ref 150–400)
RBC: 3.43 MIL/uL — ABNORMAL LOW (ref 4.22–5.81)
RDW: 15 % (ref 11.5–15.5)
WBC: 10 10*3/uL (ref 4.0–10.5)
nRBC: 0 % (ref 0.0–0.2)

## 2022-03-04 MED ORDER — NYSTATIN 100000 UNIT/ML MT SUSP
5.0000 mL | Freq: Four times a day (QID) | OROMUCOSAL | Status: DC
  Administered 2022-03-04 – 2022-03-05 (×5): 500000 [IU] via OROMUCOSAL
  Filled 2022-03-04 (×5): qty 5

## 2022-03-04 MED ORDER — AMOXICILLIN-POT CLAVULANATE 400-57 MG/5ML PO SUSR
875.0000 mg | Freq: Two times a day (BID) | ORAL | Status: DC
  Administered 2022-03-04 – 2022-03-05 (×3): 875 mg via ORAL
  Filled 2022-03-04 (×3): qty 10.9

## 2022-03-04 NOTE — Progress Notes (Signed)
PROGRESS NOTE  Derek [name] DOB: [birth year] DOA: 02/20/2022 PCP: Lavone Orn, MD   LOS: 12 days   Brief Narrative / Interim history: 76 year old M with PMH of dementia, CAD/CABG, DM-2, HTN, OSA on CPAP, recent hospitalization for left hip fracture after mechanical fall for which he underwent closed reduction with percutaneous pin fixation on 02/12/22 and discharged to SNF on 02/17/22 presented with shortness of breath, fever, poor oral intake, dysphagia, lethargy and hypoxia to 80s and admitted for severe sepsis/respiratory failure/right lower lobe pneumonia concerning for aspiration pneumonia and started on IV antibiotics.  Palliative care consulted for goals of care discussion.  Subjective / 24h Interval events: Alert, denies any shortness of breath.  Complains of a whitish thrush in his mouth  Assesement and Plan: Principal Problem:   Severe sepsis due to aspiration pneumonia Active Problems:   Acute respiratory failure with hypoxia (HCC)   Aspiration pneumonia (HCC)   Goals of care, counseling/discussion   Closed left hip fracture (HCC)   Dementia without behavioral disturbance (HCC)   Dysphagia and odynophagia   Decreased oral intake   Elevated liver enzymes   History of coronary artery bypass graft   OSA on CPAP   Controlled NIDDM-2 with hyperlipidemia   CAD s/p CABG in 2019 at [facility]   Benign essential hypertension   Rheumatoid arthritis (HCC)   Malnutrition of moderate degree   Hypokalemia and hypophosphatemia   Hyperbilirubinemia   Hypokalemia   Hypophosphatemia   Principal problem Severe sepsis due to possible aspiration pneumonia, acute respiratory failure with hypoxia -patient was admitted to the hospital with respiratory failure thought to be due to aspiration pneumonia.  He is currently on 2 L, wean off as able.  He was initially placed on Unasyn, but given worsening leukocytosis and worsening chest x-ray he was switched to Zosyn on 5/19.  White  count has now normalized.  Converted to Augmentin today.  Cultures have remained negative so far.  Active problems Goals of care, physical deconditioning, decreased oral intake/moderate malnutrition -Palliative care evaluated the patient during this hospitalization.  He is currently DNR.  Prognosis appears to be quite poor especially given his decline for the past several weeks that resulted in his fall and hip fracture 2 weeks ago.  Continue palliative care discussions  Dysphagia/odynophagia -Continue diet as per SLP recommendations.  Aspiration precaution.  He started to eat a little bit more now that his infection is better controlled  Oral thrush-add nystatin today   Dementia without behavioral disturbance -As per patient's wife, patient's cognition has been declining lately.  Recent closed left hip fracture -Status post surgical intervention on 02/12/2022 by Dr Lucia Gaskins. Continue Lovenox for DVT prophylaxis while inpatient and switch to full dose aspirin on discharge.  Orthopedic surgery evaluated in the hospital, staples were removed 9/24.   Leukocytosis-resolved.  Transition to Augmentin   Anemia of chronic disease -Hemoglobin currently stable.  Monitor intermittently   Hypernatremia -resolved.  Monitor off IV fluids, sodium stable   Elevated LFTs -LFTs mildly elevated, stable overall   Rheumatoid arthritis -Methotrexate on hold   Benign essential hypertension -Lasix on hold, continue metoprolol.  Blood pressure remains on the soft side, probably hold Lasix on discharge  CAD status post CABG in 2019 -No chest pain.  Outpatient follow-up with cardiology.  Continue metoprolol  Depression -Fluoxetine held because of mental status and poor oral intake.   Diabetes mellitus type 2 -A1c 5.1.  Jardiance, metformin on hold continue CBGs with SSI   Hyperlipidemia -  Crestor on hold because of elevated LFTs   OSA on CPAP -Continue nightly CPAP   Hypokalemia -potassium remains normal at 3.7  today  Scheduled Meds:  (feeding supplement) PROSource Plus  30 mL Oral BID BM   amoxicillin-clavulanate  875 mg Oral Q12H   docusate  100 mg Oral BID   feeding supplement  237 mL Oral TID BM   folic acid-pyridoxine-cyancobalamin  1 tablet Oral Daily   guaiFENesin  15 mL Oral QID   insulin aspart  0-9 Units Subcutaneous TID WC   metoprolol tartrate  12.5 mg Oral BID   nystatin  5 mL Mouth/Throat QID   polyethylene glycol  17 g Oral Daily   Continuous Infusions:  sodium chloride Stopped (02/27/22 1257)   PRN Meds:.sodium chloride, acetaminophen **OR** acetaminophen, HYDROcodone-acetaminophen, lip balm, ondansetron (ZOFRAN) IV, senna-docusate, sodium phosphate  Diet Orders (From admission, onward)     Start     Ordered   03/03/22 1659  DIET DYS 2 Room service appropriate? Yes; Fluid consistency: Nectar Thick  Diet effective now       Comments: Thin soups OK via tsp  Question Answer Comment  Room service appropriate? Yes   Fluid consistency: Nectar Thick      03/03/22 1658            DVT prophylaxis: SCDs Start: 02/20/22 1530   Lab Results  Component Value Date   PLT 469 (H) 03/04/2022      Code Status: DNR  Family Communication: wife at bedside   Status is: Inpatient  Remains inpatient appropriate because: IV antibiotics, leukocytosis   Level of care: Progressive  Consultants:   Procedures:  none  Objective: Vitals:   03/03/22 1700 03/03/22 1800 03/03/22 1900 03/03/22 2000  BP: '99/61 95/64 98/67 '$ 100/65  Pulse:      Resp:      Temp:    99.3 F (37.4 C)  TempSrc:    Oral  SpO2:    93%  Weight:      Height:        Intake/Output Summary (Last 24 hours) at 03/04/2022 0940 Last data filed at 03/04/2022 0600 Gross per 24 hour  Intake 628.58 ml  Output 1650 ml  Net -1021.42 ml    Wt Readings from Last 3 Encounters:  02/20/22 69.2 kg  02/11/22 75.8 kg  06/16/18 82.7 kg    Examination:  Constitutional: NAD Eyes: lids and conjunctivae  normal, no scleral icterus ENMT: mmm Neck: normal, supple Respiratory: clear to auscultation bilaterally, no wheezing, no crackles.  Weak respiratory effort Cardiovascular: Regular rate and rhythm, no murmurs / rubs / gallops.  Abdomen: soft, no distention, no tenderness. Bowel sounds positive.  Skin: no rashes Neurologic: no focal deficits, equal strength  Data Reviewed: I have independently reviewed following labs and imaging studies   CBC Recent Labs  Lab 02/26/22 0458 02/27/22 0532 02/28/22 0629 03/01/22 0401 03/02/22 0448 03/03/22 0441 03/04/22 0455  WBC 28.3* 29.9* 23.0* 22.3* 19.2* 14.2* 10.0  HGB 12.0* 11.4* 9.7* 11.1* 11.7* 11.2* 11.4*  HCT 36.1* 33.9* 29.1* 34.1* 34.5* 33.3* 33.7*  PLT 306 349 358 407* 384 397 469*  MCV 100.8* 100.9* 100.3* 100.3* 98.0 97.1 98.3  MCH 33.5 33.9 33.4 32.6 33.2 32.7 33.2  MCHC 33.2 33.6 33.3 32.6 33.9 33.6 33.8  RDW 14.7 15.2 15.3 15.2 15.2 15.0 15.0  LYMPHSABS 5.2* 6.5* 6.5* 5.8* 5.1*  --   --   MONOABS 2.3* 2.5* 1.9* 1.8* 2.2*  --   --  EOSABS 0.3 0.5 0.3 0.2 0.1  --   --   BASOSABS 0.1 0.3* 0.2* 0.2* 0.1  --   --      Recent Labs  Lab 02/26/22 0458 02/27/22 0532 02/28/22 0629 03/01/22 0401 03/02/22 0448 03/03/22 0538 03/04/22 0455  NA 148* 146* 141 138 136 136 137  K 3.3* 3.4* 4.5 3.3* 3.6 3.5 3.7  CL 118* 117* 111 109 108 105 107  CO2 '23 24 25 23 23 23 25  '$ GLUCOSE 110* 99 226* 88 86 76 112*  BUN '17 15 12 12 11 11 13  '$ CREATININE 0.90 0.95 0.90 0.84 0.85 0.80 0.81  CALCIUM 9.2 9.0 8.2* 8.3* 8.3* 8.4* 8.4*  AST 75*  --  74*  --   --  68*  --   ALT 44  --  41  --   --  37  --   ALKPHOS 94  --  96  --   --  87  --   BILITOT 1.1  --  0.9  --   --  0.7  --   ALBUMIN 2.2*  --  1.8*  --   --  1.9*  --   MG 2.1 2.2 1.9 2.0 2.0 2.0  --   TSH 0.221*  --   --   --   --   --   --   AMMONIA 33  --   --   --   --   --   --       ------------------------------------------------------------------------------------------------------------------ No results for input(s): CHOL, HDL, LDLCALC, TRIG, CHOLHDL, LDLDIRECT in the last 72 hours.  Lab Results  Component Value Date   HGBA1C 5.4 02/12/2022   ------------------------------------------------------------------------------------------------------------------ No results for input(s): TSH, T4TOTAL, T3FREE, THYROIDAB in the last 72 hours.  Invalid input(s): FREET3  Cardiac Enzymes No results for input(s): CKMB, TROPONINI, MYOGLOBIN in the last 168 hours.  Invalid input(s): CK ------------------------------------------------------------------------------------------------------------------    Component Value Date/Time   BNP 210.3 (H) 02/20/2022 1017    CBG: Recent Labs  Lab 03/03/22 0726 03/03/22 1120 03/03/22 1657 03/03/22 2131 03/04/22 0738  GLUCAP 72 108* 189* 138* 105*     No results found for this or any previous visit (from the past 240 hour(s)).    Radiology Studies: No results found.   Marzetta Board, MD, PhD [facility]  Between 7 am - 7 pm I am available, please contact me via Amion (for emergencies) or Securechat (non urgent messages)  Between 7 pm - 7 am I am not available, please contact night coverage MD/APP via Amion

## 2022-03-04 NOTE — TOC Progression Note (Addendum)
Transition of Care [facility]) - Progression Note    Patient Details  Name: Derek [name] MRN: [id number] Date of Birth: [birth year]  Transition of Care [facility]) CM/SW Contact  Karmello Abercrombie, Juliann Pulse, RN Phone Number: 03/04/2022, 10:19 AM  Clinical Narrative: Updated clinicial sent to [employer]-OT notes from yesterday-documented patient more alert.-awaiting auth.   -2:26p-Received auth until 5/30 #[id number] for [address] w/Palliative Care Services.    Expected Discharge Plan: [place] (from [facility]) Barriers to Discharge: [employer]  Expected Discharge Plan and Services Expected Discharge Plan: [place] (from [facility])   Discharge Planning Services: CM Consult Post Acute Care Choice: [place] Living arrangements for the past 2 months: Single Family Home                                       Social Determinants of Health (SDOH) Interventions    Readmission Risk Interventions     View : No data to display.

## 2022-03-04 NOTE — Progress Notes (Signed)
Speech Language Pathology Treatment: Dysphagia  Patient Details Name: Derek [name] MRN: [id number] DOB: 04/10/1946 Today's Date: 03/04/2022 Time: 5102-5852 SLP Time Calculation (min) (ACUTE ONLY): 10 min  Assessment / Plan / Recommendation Clinical Impression  Follow up session to educate wife to results of MBS conducted earlier today - given she was not able to be present for actual test.   SlP reviewed flouro loops with pt and wife - informing why swallow precautions, mitigation strategies are needed.   Using teach back and demonstration reviewed swallow/respiration timing.  Pt admits to issues with swallowing solids - transiting solids into pharynx - thus advised to continue to use puree as found helpful in xray dept.  Encouraged pt to use his IS BEFORE MEALS rather than after due to increased aspiration risk.  Cough noted with pt swallowing saliva- suspect potential secretion aspiration.  Free water between meals continues to be recommended to maximize QOL and hydration - importance of oral care BEFORE reviewed using teach back.  Per RN, pt to potentially dc tomorrow.    Recommend follow up SLP at SNF with respiratory muscle strength training to maximize laryngeal closure and airway proteciton.  Wife and pt agreeable to plan and using teach back able to state 2 key factors regarding dysphagia.    HPI HPI: Pt is a 76 y.o. male who presented with dyspnea and fever. CXR 5/14: New airspace disease in the medial right base compatible with atelectasis or pneumonia. Per RN note 5/14, pt given coffee and "he began coughing and spit it up along with small amount of thick mucus." PMH: dementia, CAD s/p CABG, DM2, HTN, OSA on CPAP, mild cognitive impairment.  Pt has been on full liquids, nectar - but has been receiving thin liquids.  SLP follow up for dysphagia management, indication for instrumental swallow evaluation.  Pt is s/p palliative meeting with goal of full treatment - code status is now  DNR.      SLP Plan  Continue with current plan of care      Recommendations for follow up therapy are one component of a multi-disciplinary discharge planning process, led by the attending physician.  Recommendations may be updated based on patient status, additional functional criteria and insurance authorization.    Recommendations  Diet recommendations: Dysphagia 2 (fine chop);Nectar-thick liquid (tsps all thin ok, water protocol) Supervision: Full supervision/cueing for compensatory strategies Compensations: Slow rate;Small sips/bites;Minimize environmental distractions;Other (Comment) (stop po if short of breath or coughing) Postural Changes and/or Swallow Maneuvers: Seated upright 90 degrees;Upright 30-60 min after meal                Oral Care Recommendations: Oral care BID;Staff/trained caregiver to provide oral care Follow Up Recommendations: Skilled nursing-short term rehab (<3 hours/day) Assistance recommended at discharge: Frequent or constant Supervision/Assistance SLP Visit Diagnosis: Dysphagia, oropharyngeal phase (R13.12) Plan: Continue with current plan of care          Derek Lime, MS [place] Office [phone] Pager [phone]  Derek [name]  03/04/2022, 5:07 PM

## 2022-03-04 NOTE — Progress Notes (Signed)
Physical Therapy Treatment Patient Details Name: Derek [name] MRN: [id number] DOB: 1946/01/18 Today's Date: 03/04/2022   History of Present Illness Derek [name] is a 76 y.o. male with medical history significant of CAD s/p CABG, DM2, HTN, OSA on CPAP, mild cognitive impairment. Presenting with dyspnea and fever. Recently admitted for hip fracture, s/p ORIF. Was sent to SNF for rehab on 5/10. While there he apparently became dyspneic and febrile. admitted with RLL pna.    PT Comments    General Comments: AxO x 2 (improved) following all commands and able to express needs.  "hungry" wants a hamburger with mustard.  Wants to watch "westerns" on TV.  This is the best I have seen him. Assisted OOB was difficult.  General bed mobility comments: increased initiation and self attempt but is very weak.  Required Mod Assist for upper body and Max Assist to complete scooting to EOB using bed pad.  Still unable to self sit EOB present with severe LEFT lean and inability to self correct with MAX c/o weakness. General transfer comment: pt was too weak to attempt traditional sit to stand with walker.  So performed SPS "Bear Hug" 1/4 pivot from elevated bed to recliner. Pt briefly able to support his weight but mostly profoundly weak.  Will need + 2 assist to attempt gait. Pt will need [facility].    Recommendations for follow up therapy are one component of a multi-disciplinary discharge planning process, led by the attending physician.  Recommendations may be updated based on patient status, additional functional criteria and insurance authorization.  Follow Up Recommendations  Skilled nursing-short term rehab (<3 hours/day)     Assistance Recommended at Discharge Frequent or constant Supervision/Assistance  Patient can return home with the following Two people to help with walking and/or transfers;Two people to help with bathing/dressing/bathroom;Direct supervision/assist for medications  management;Help with stairs or ramp for entrance;Assist for transportation;Assistance with feeding;Direct supervision/assist for financial management;Assistance with cooking/housework   Equipment Recommendations  None recommended by PT    Recommendations for Other Services       Precautions / Restrictions Precautions Precautions: Fall Precaution Comments: monitor vitals -sat and BP + Asp PNA Thicken Liquids Restrictions Weight Bearing Restrictions: No LLE Weight Bearing: Weight bearing as tolerated Other Position/Activity Restrictions: per previous notes recent admission for L hip fx     Mobility  Bed Mobility Overal bed mobility: Needs Assistance Bed Mobility: Supine to Sit     Supine to sit: Mod assist, Max assist     General bed mobility comments: increased initiation and self attempt but is very weak.  Required Mod Assist for upper body and Max Assist to complete scooting to EOB using bed pad.  Still unable to self sit EOB present with severe LEFT lean and inability to self correct with MAX c/o weakness.    Transfers Overall transfer level: Needs assistance Equipment used: None Transfers: Bed to chair/wheelchair/BSC   Stand pivot transfers: Max assist         General transfer comment: pt was too weak to attempt traditional sit to stand with walker.  So performed SPS "Bear Hug" 1/4 pivot from elevated bed to recliner.    Ambulation/Gait               General Gait Details: transfers only due to profound weakness   Stairs             Wheelchair Mobility    Modified Rankin (Stroke Patients Only)  Balance                                            Cognition Arousal/Alertness: Awake/alert Behavior During Therapy: WFL for tasks assessed/performed Overall Cognitive Status: Within Functional Limits for tasks assessed                                 General Comments: AxO x 2 (improved) following all commands  and able to express needs.  "hungry" wants a hamburger with mustard.  Wants to watch "westerns" on TV.  This is the best I have seen him.        Exercises      General Comments        Pertinent Vitals/Pain Pain Assessment Pain Assessment: No/denies pain    Home Living                          Prior Function            PT Goals (current goals can now be found in the care plan section) Progress towards PT goals: Progressing toward goals    Frequency    Min 2X/week      PT Plan Current plan remains appropriate    Co-evaluation              AM-PAC PT "6 Clicks" Mobility   Outcome Measure  Help needed turning from your back to your side while in a flat bed without using bedrails?: A Lot Help needed moving from lying on your back to sitting on the side of a flat bed without using bedrails?: A Lot Help needed moving to and from a bed to a chair (including a wheelchair)?: A Lot Help needed standing up from a chair using your arms (e.g., wheelchair or bedside chair)?: A Lot Help needed to walk in hospital room?: Total Help needed climbing 3-5 steps with a railing? : Total 6 Click Score: 10    End of Session Equipment Utilized During Treatment: Gait belt Activity Tolerance: Patient limited by fatigue Patient left: in chair;with call bell/phone within reach Nurse Communication: Mobility status;Need for lift equipment PT Visit Diagnosis: Muscle weakness (generalized) (M62.81);Difficulty in walking, not elsewhere classified (R26.2)     Time: 9741-6384 PT Time Calculation (min) (ACUTE ONLY): 25 min  Charges:  $Therapeutic Activity: 23-37 mins                     {Jozelynn Danielson  PTA Acute  Rehabilitation Services Pager      [phone] Office      [phone]

## 2022-03-04 NOTE — Progress Notes (Signed)
Modified Barium Swallow Progress Note  Patient Details  Name: Derek [name] MRN: [id number] Date of Birth: 22-Jul-1946  Today's Date: 03/04/2022  Modified Barium Swallow completed.  Full report located under Chart Review in the Imaging Section.  Brief recommendations include the following:  Clinical Impression  Patient presents with moderate oral and mild pharyngeal dysphagia c/b decreased oral control with premature spillage of all boluses into pharynx.  Prolonged mastication with solids noted -with cracker pooling at vallecula requiring pudding to elicit swallow after pt with great effort did not trigger swallow. Patient's pharyngeal swallow is delayed triggering at pyriform sinus region - allowing at least laryngeal penetration of thin via cup/straw.  Pt may have aspirated with thin (likely) - as he performed reflexive cough and shoulder often blocked view of larynx/trachea.  No aspiration or penetration with thin via tsp, nectar, puree or cracker.  Suspect his oropharyngeal dysphagia is contributing to poor intake -  as wife reports poor intake prior to hip fx.  Recommend follow previously prescribed diet with strict precautions.  Of note, pt desaturating during study - with oxygen saturation at mid 80s and he appears weaker today.  Did not test chin tuck posture as pt had difficulty with current positioning.  Concern for dysphagia impacting nutrition and airway protection adequacy is present -Pt following up with palliative per notes.   Plan is in place for aspiration mitigation.  Educated pt to findings using live video feed - for clinical reasoning.  Messaged RN following exam.  Will follow up for pt/family education and reinforcement of effective compensations.   Swallow Evaluation Recommendations       SLP Diet Recommendations: Dysphagia 2 (Fine chop) solids;Nectar thick liquid (thin via tsp ok, thin water ok between meals after mouth care)       Medication Administration: Whole  meds with puree   Supervision: Staff to assist with self feeding   Compensations: Slow rate;Small sips/bites;Minimize environmental distractions;Other (Comment) (stop and rest if dyspenic)   Postural Changes: Seated upright at 90 degrees;Remain semi-upright after after feeds/meals (Comment)   Oral Care Recommendations: Oral care BID      [name], MS [facility] SLP Acute Rehab Services Office [phone] Pager [phone]   [name] 03/04/2022,10:59 AM

## 2022-03-05 DIAGNOSIS — A419 Sepsis, unspecified organism: Secondary | ICD-10-CM | POA: Diagnosis not present

## 2022-03-05 DIAGNOSIS — I10 Essential (primary) hypertension: Secondary | ICD-10-CM | POA: Diagnosis not present

## 2022-03-05 DIAGNOSIS — I2510 Atherosclerotic heart disease of native coronary artery without angina pectoris: Secondary | ICD-10-CM | POA: Diagnosis not present

## 2022-03-05 DIAGNOSIS — R41841 Cognitive communication deficit: Secondary | ICD-10-CM | POA: Diagnosis not present

## 2022-03-05 DIAGNOSIS — M069 Rheumatoid arthritis, unspecified: Secondary | ICD-10-CM | POA: Diagnosis not present

## 2022-03-05 DIAGNOSIS — J9601 Acute respiratory failure with hypoxia: Secondary | ICD-10-CM | POA: Diagnosis not present

## 2022-03-05 DIAGNOSIS — G4733 Obstructive sleep apnea (adult) (pediatric): Secondary | ICD-10-CM | POA: Diagnosis not present

## 2022-03-05 DIAGNOSIS — R1310 Dysphagia, unspecified: Secondary | ICD-10-CM | POA: Diagnosis not present

## 2022-03-05 DIAGNOSIS — F0390 Unspecified dementia without behavioral disturbance: Secondary | ICD-10-CM | POA: Diagnosis not present

## 2022-03-05 DIAGNOSIS — R262 Difficulty in walking, not elsewhere classified: Secondary | ICD-10-CM | POA: Diagnosis not present

## 2022-03-05 DIAGNOSIS — Z9181 History of falling: Secondary | ICD-10-CM | POA: Diagnosis not present

## 2022-03-05 DIAGNOSIS — F32A Depression, unspecified: Secondary | ICD-10-CM | POA: Diagnosis not present

## 2022-03-05 DIAGNOSIS — Z8619 Personal history of other infectious and parasitic diseases: Secondary | ICD-10-CM | POA: Diagnosis not present

## 2022-03-05 DIAGNOSIS — R5381 Other malaise: Secondary | ICD-10-CM | POA: Diagnosis not present

## 2022-03-05 DIAGNOSIS — M6281 Muscle weakness (generalized): Secondary | ICD-10-CM | POA: Diagnosis not present

## 2022-03-05 DIAGNOSIS — S72002D Fracture of unspecified part of neck of left femur, subsequent encounter for closed fracture with routine healing: Secondary | ICD-10-CM | POA: Diagnosis not present

## 2022-03-05 DIAGNOSIS — R498 Other voice and resonance disorders: Secondary | ICD-10-CM | POA: Diagnosis not present

## 2022-03-05 DIAGNOSIS — J690 Pneumonitis due to inhalation of food and vomit: Secondary | ICD-10-CM | POA: Diagnosis not present

## 2022-03-05 DIAGNOSIS — E118 Type 2 diabetes mellitus with unspecified complications: Secondary | ICD-10-CM | POA: Diagnosis not present

## 2022-03-05 DIAGNOSIS — R296 Repeated falls: Secondary | ICD-10-CM | POA: Diagnosis not present

## 2022-03-05 DIAGNOSIS — R6520 Severe sepsis without septic shock: Secondary | ICD-10-CM | POA: Diagnosis not present

## 2022-03-05 DIAGNOSIS — S72402A Unspecified fracture of lower end of left femur, initial encounter for closed fracture: Secondary | ICD-10-CM | POA: Diagnosis not present

## 2022-03-05 DIAGNOSIS — R2681 Unsteadiness on feet: Secondary | ICD-10-CM | POA: Diagnosis not present

## 2022-03-05 DIAGNOSIS — G3184 Mild cognitive impairment, so stated: Secondary | ICD-10-CM | POA: Diagnosis not present

## 2022-03-05 DIAGNOSIS — Z8673 Personal history of transient ischemic attack (TIA), and cerebral infarction without residual deficits: Secondary | ICD-10-CM | POA: Diagnosis not present

## 2022-03-05 DIAGNOSIS — R1312 Dysphagia, oropharyngeal phase: Secondary | ICD-10-CM | POA: Diagnosis not present

## 2022-03-05 DIAGNOSIS — E440 Moderate protein-calorie malnutrition: Secondary | ICD-10-CM | POA: Diagnosis not present

## 2022-03-05 LAB — BASIC METABOLIC PANEL
Anion gap: 5 (ref 5–15)
BUN: 10 mg/dL (ref 8–23)
CO2: 26 mmol/L (ref 22–32)
Calcium: 8.5 mg/dL — ABNORMAL LOW (ref 8.9–10.3)
Chloride: 105 mmol/L (ref 98–111)
Creatinine, Ser: 0.73 mg/dL (ref 0.61–1.24)
GFR, Estimated: >60 mL/min (ref >60)
Glucose, Bld: 103 mg/dL — ABNORMAL HIGH (ref 70–99)
Potassium: 3.5 mmol/L (ref 3.5–5.1)
Sodium: 136 mmol/L (ref 135–145)

## 2022-03-05 LAB — CBC
HCT: 32.4 % — ABNORMAL LOW (ref 39.0–52.0)
Hemoglobin: 10.9 g/dL — ABNORMAL LOW (ref 13.0–17.0)
MCH: 32.8 pg (ref 26.0–34.0)
MCHC: 33.6 g/dL (ref 30.0–36.0)
MCV: 97.6 fL (ref 80.0–100.0)
Platelets: 430 10*3/uL — ABNORMAL HIGH (ref 150–400)
RBC: 3.32 MIL/uL — ABNORMAL LOW (ref 4.22–5.81)
RDW: 14.8 % (ref 11.5–15.5)
WBC: 9.5 10*3/uL (ref 4.0–10.5)
nRBC: 0 % (ref 0.0–0.2)

## 2022-03-05 LAB — GLUCOSE, CAPILLARY
Glucose-Capillary: 109 mg/dL — ABNORMAL HIGH (ref 70–99)
Glucose-Capillary: 147 mg/dL — ABNORMAL HIGH (ref 70–99)

## 2022-03-05 LAB — MAGNESIUM: Magnesium: 2.3 mg/dL (ref 1.7–2.4)

## 2022-03-05 MED ORDER — AMOXICILLIN-POT CLAVULANATE 400-57 MG/5ML PO SUSR: 875.0000 mg | Freq: Two times a day (BID) | ORAL | 0 refills | Status: AC

## 2022-03-05 MED ORDER — NYSTATIN 100000 UNIT/ML MT SUSP: 5.0000 mL | Freq: Four times a day (QID) | OROMUCOSAL | 0 refills | Status: AC

## 2022-03-05 MED ORDER — FUROSEMIDE 20 MG PO TABS: 10.0000 mg | ORAL_TABLET | Freq: Every day | ORAL | Status: AC | PRN

## 2022-03-05 MED ORDER — HYDROCODONE-ACETAMINOPHEN 5-325 MG PO TABS: 1.0000 | ORAL_TABLET | Freq: Four times a day (QID) | ORAL | 0 refills | Status: DC | PRN

## 2022-03-05 NOTE — TOC Transition Note (Addendum)
Transition of Care [facility]) - CM/SW Discharge Note   Patient Details  Name: Derek [name] MRN: [id number] Date of Birth: 1945-12-05  [facility] CM/SW Contact:  Dessa Phi, RN Phone Number: 03/05/2022, 9:16 AM   Clinical Narrative:d/c today [address] w/palliative care services-rep [facility] aware. Awaiting rm#,tel# report prior PTAR.    -11:42a-Going to rm#[id number],report tel#[phone]. PTAR called. No further CM needs.   Final next level of care: Skilled Nursing Facility Barriers to Discharge: No Barriers Identified   Patient Goals and CMS Choice Patient states their goals for this hospitalization and ongoing recovery are:: unable to state CMS Medicare.gov Compare Post Acute Care list provided to:: Patient Represenative (must comment) (wife) Choice offered to / list presented to : Spouse  Discharge Placement   Existing PASRR number confirmed : 02/15/22          Patient chooses bed at: [facility] Patient to be transferred to facility by: [place] Name of family member notified: Ann-spouse Patient and family notified of of transfer: 03/05/22  Discharge Plan and Services   Discharge Planning Services: CM Consult Post Acute Care Choice: [place]                               Social Determinants of Health (SDOH) Interventions     Readmission Risk Interventions     View : No data to display.

## 2022-03-05 NOTE — Discharge Summary (Signed)
Physician Discharge Summary  Derek [name] SAY:[id number] DOB: 1946-10-11 DOA: 02/20/2022  PCP: Lavone Orn, MD  Admit date: 02/20/2022 Discharge date: 03/05/2022  Admitted From: SNF Disposition:  SNF  Recommendations for Outpatient Follow-up:  Follow up with PCP in 1-2 weeks Palliative follow-up at SNF Please ensure strict aspiration precautions, elevate head of bed, supervision with eating  Home Health: none Equipment/Devices: none  Discharge Condition: stable CODE STATUS: DNR Diet Orders (From admission, onward)     Start     Ordered   03/03/22 1659  DIET DYS 2 Room service appropriate? Yes; Fluid consistency: Nectar Thick  Diet effective now       Comments: Thin soups OK via tsp  Question Answer Comment  Room service appropriate? Yes   Fluid consistency: Nectar Thick      03/03/22 1658            HPI: Per admitting MD, Derek [name] is a 76 y.o. male with medical history significant of CAD s/p CABG, DM2, HTN, OSA on CPAP, mild cognitive impairment. Presenting with dyspnea and fever. Recently admitted for hip fracture. Was sent to SNF for rehab on 5/10. While there he apparently became dyspneic and febrile. He had become increasingly lethargic. When he was examined this morning, he was noted to be hypoxic into the 80s. The SNF staff became concerned and called for EMS.   Hospital Course / Discharge diagnoses: Principal Problem:   Severe sepsis due to aspiration pneumonia Active Problems:   Acute respiratory failure with hypoxia (HCC)   Aspiration pneumonia (HCC)   Goals of care, counseling/discussion   Closed left hip fracture (HCC)   Dementia without behavioral disturbance (HCC)   Dysphagia and odynophagia   Decreased oral intake   Elevated liver enzymes   History of coronary artery bypass graft   OSA on CPAP   Controlled NIDDM-2 with hyperlipidemia   CAD s/p CABG in 2019 at [facility]   Benign essential hypertension   Rheumatoid arthritis (HCC)    Malnutrition of moderate degree   Hypokalemia and hypophosphatemia   Hyperbilirubinemia   Hypokalemia   Hypophosphatemia   Principal problem Severe sepsis due to possible aspiration pneumonia, acute respiratory failure with hypoxia -patient was admitted to the hospital with respiratory failure thought to be due to aspiration pneumonia.  He is currently on 2 L, wean off as able.  He was initially placed on Unasyn, but given worsening leukocytosis and worsening chest x-ray he was switched to Zosyn on 5/19.  Following antibiotics change, he has recovered, his white count has normalized, he remained afebrile and stable.  He was converted to Augmentin, continue for 2 additional days to complete a total of 10-day course.  Please follow strict aspiration precautions  Active problems Goals of care, physical deconditioning, decreased oral intake/moderate malnutrition -Palliative care evaluated the patient during this hospitalization.  He is currently DNR.  Prognosis appears to be quite poor especially given his decline for the past several weeks that resulted in his fall and hip fracture 2 weeks ago.  Continue palliative care discussions at SNF Dysphagia/odynophagia -Continue diet as per SLP recommendations.  Aspiration precaution.   Oral thrush-add nystatin, continue for 6 additional days for a week course Dementia without behavioral disturbance -As per patient's wife, patient's cognition has been declining lately. Recent closed left hip fracture -Status post surgical intervention on 02/12/2022 by Dr Lucia Gaskins. Continue Lovenox for DVT prophylaxis while inpatient and switch to full dose aspirin on discharge.  Orthopedic surgery evaluated in  the hospital, staples were removed 5/24. Leukocytosis-resolved.  Transition to Augmentin Anemia of chronic disease -Hemoglobin currently stable.  Monitor intermittently Hypernatremia -resolved.  Monitor off IV fluids, sodium stable Elevated LFTs -LFTs mildly elevated, stable  overall Rheumatoid arthritis -continue home medications  Benign essential hypertension -Lasix on hold, continue metoprolol.  Blood pressure remains on the soft side, Lasix changed to as needed on discharge CAD status post CABG in 2019 -No chest pain.  Outpatient follow-up with cardiology.  Continue metoprolol Depression -Fluoxetine held because of poor oral intake Diabetes mellitus type 2 -A1c 5.1.  Jardiance, metformin on hold continue CBGs with SSI Hyperlipidemia -continue home medications  OSA on CPAP -Continue nightly CPAP Hypokalemia -potassium stabilized after repletion     Discharge Instructions   Allergies as of 03/05/2022   No Known Allergies      Medication List     STOP taking these medications    FLUoxetine 20 MG capsule Commonly known as: PROZAC       TAKE these medications    acetaminophen 650 MG CR tablet Commonly known as: TYLENOL Take 650 mg by mouth 2 (two) times daily.   amoxicillin-clavulanate 400-57 MG/5ML suspension Commonly known as: AUGMENTIN Take 10.9 mLs (875 mg total) by mouth every 12 (twelve) hours for 2 days.   aspirin 325 MG tablet Commonly known as: Bayer Aspirin Take 1 tablet (325 mg total) by mouth daily.   docusate sodium 100 MG capsule Commonly known as: COLACE Take 1 capsule (100 mg total) by mouth 2 (two) times daily.   empagliflozin 25 MG Tabs tablet Commonly known as: JARDIANCE Take 12.5 mg by mouth every morning.   feeding supplement Liqd Take 237 mLs by mouth 2 (two) times daily between meals.   Folic Acid-Vit U9-NAT F57 2.5-25-1 MG Tabs tablet Commonly known as: FOLBEE Take 1 tablet by mouth daily.   furosemide 20 MG tablet Commonly known as: LASIX Take 0.5 tablets (10 mg total) by mouth daily as needed for fluid or edema. What changed:  when to take this reasons to take this   HYDROcodone-acetaminophen 5-325 MG tablet Commonly known as: NORCO/VICODIN Take 1 tablet by mouth every 6 (six) hours as needed  for moderate pain or severe pain.   metFORMIN 500 MG 24 hr tablet Commonly known as: GLUCOPHAGE-XR Take 1,000 mg by mouth daily with breakfast.   methotrexate 2.5 MG tablet Commonly known as: RHEUMATREX Take 10 mg by mouth 2 (two) times a week. Friday & Saturday   metoprolol succinate 25 MG 24 hr tablet Commonly known as: TOPROL-XL Take 12.5 mg by mouth every morning.   nystatin 100000 UNIT/ML suspension Commonly known as: MYCOSTATIN Use as directed 5 mLs (500,000 Units total) in the mouth or throat 4 (four) times daily for 6 days.   polyethylene glycol 17 g packet Commonly known as: MIRALAX / GLYCOLAX Take 17 g by mouth daily.   rosuvastatin 20 MG tablet Commonly known as: CRESTOR Take 20 mg by mouth at bedtime.         Consultations: Palliative   Subjective: - no chest pain, shortness of breath, no abdominal pain, nausea or vomiting.   Discharge Exam: BP 98/66   Pulse 78   Temp 98.6 F (37 C) (Oral)   Resp (!) 23   Ht '5\' 7"'$  (1.702 m)   Wt 69.2 kg   SpO2 92%   BMI 23.88 kg/m   General: Pt is alert, awake, not in acute distress Cardiovascular: RRR, S1/S2 +, no rubs, no gallops  Respiratory: CTA bilaterally, no wheezing, no rhonchi Abdominal: Soft, NT, ND, bowel sounds + Extremities: no edema, no cyanosis    The results of significant diagnostics from this hospitalization (including imaging, microbiology, ancillary and laboratory) are listed below for reference.     Microbiology: No results found for this or any previous visit (from the past 240 hour(s)).   Labs: Basic Metabolic Panel: Recent Labs  Lab 02/28/22 0629 03/01/22 0401 03/02/22 0448 03/03/22 0538 03/04/22 0455 03/05/22 0526  NA 141 138 136 136 137 136  K 4.5 3.3* 3.6 3.5 3.7 3.5  CL 111 109 108 105 107 105  CO2 '25 23 23 23 25 26  '$ GLUCOSE 226* 88 86 76 112* 103*  BUN '12 12 11 11 13 10  '$ CREATININE 0.90 0.84 0.85 0.80 0.81 0.73  CALCIUM 8.2* 8.3* 8.3* 8.4* 8.4* 8.5*  MG 1.9 2.0  2.0 2.0  --  2.3   Liver Function Tests: Recent Labs  Lab 02/28/22 0629 03/03/22 0538  AST 74* 68*  ALT 41 37  ALKPHOS 96 87  BILITOT 0.9 0.7  PROT 6.1* 7.3  ALBUMIN 1.8* 1.9*   CBC: Recent Labs  Lab 02/27/22 0532 02/28/22 0629 03/01/22 0401 03/02/22 0448 03/03/22 0441 03/04/22 0455 03/05/22 0526  WBC 29.9* 23.0* 22.3* 19.2* 14.2* 10.0 9.5  NEUTROABS 19.0* 13.4* 14.0* 11.4*  --   --   --   HGB 11.4* 9.7* 11.1* 11.7* 11.2* 11.4* 10.9*  HCT 33.9* 29.1* 34.1* 34.5* 33.3* 33.7* 32.4*  MCV 100.9* 100.3* 100.3* 98.0 97.1 98.3 97.6  PLT 349 358 407* 384 397 469* 430*   CBG: Recent Labs  Lab 03/04/22 0738 03/04/22 1140 03/04/22 1646 03/04/22 2129 03/05/22 0719  GLUCAP 105* 115* 140* 111* 109*   Hgb A1c No results for input(s): HGBA1C in the last 72 hours. Lipid Profile No results for input(s): CHOL, HDL, LDLCALC, TRIG, CHOLHDL, LDLDIRECT in the last 72 hours. Thyroid function studies No results for input(s): TSH, T4TOTAL, T3FREE, THYROIDAB in the last 72 hours.  Invalid input(s): FREET3 Urinalysis    Component Value Date/Time   COLORURINE YELLOW 02/20/2022 1446   APPEARANCEUR HAZY (A) 02/20/2022 1446   LABSPEC 1.027 02/20/2022 1446   PHURINE 5.0 02/20/2022 1446   GLUCOSEU >=500 (A) 02/20/2022 1446   HGBUR MODERATE (A) 02/20/2022 1446   BILIRUBINUR NEGATIVE 02/20/2022 1446   KETONESUR 20 (A) 02/20/2022 1446   PROTEINUR 100 (A) 02/20/2022 1446   UROBILINOGEN 1.0 06/06/2012 1037   NITRITE NEGATIVE 02/20/2022 1446   LEUKOCYTESUR NEGATIVE 02/20/2022 1446    FURTHER DISCHARGE INSTRUCTIONS:   Get Medicines reviewed and adjusted: Please take all your medications with you for your next visit with your Primary MD   Laboratory/radiological data: Please request your Primary MD to go over all hospital tests and procedure/radiological results at the follow up, please ask your Primary MD to get all Hospital records sent to his/her office.   In some cases, they will  be blood work, cultures and biopsy results pending at the time of your discharge. Please request that your primary care M.D. goes through all the records of your hospital data and follows up on these results.   Also Note the following: If you experience worsening of your admission symptoms, develop shortness of breath, life threatening emergency, suicidal or homicidal thoughts you must seek medical attention immediately by calling 911 or calling your MD immediately  if symptoms less severe.   You must read complete instructions/literature along with all the possible adverse reactions/side  effects for all the Medicines you take and that have been prescribed to you. Take any new Medicines after you have completely understood and accpet all the possible adverse reactions/side effects.    Do not drive when taking Pain medications or sleeping medications (Benzodaizepines)   Do not take more than prescribed Pain, Sleep and Anxiety Medications. It is not advisable to combine anxiety,sleep and pain medications without talking with your primary care practitioner   Special Instructions: If you have smoked or chewed Tobacco  in the last 2 yrs please stop smoking, stop any regular Alcohol  and or any Recreational drug use.   Wear Seat belts while driving.   Please note: You were cared for by a hospitalist during your hospital stay. Once you are discharged, your primary care physician will handle any further medical issues. Please note that NO REFILLS for any discharge medications will be authorized once you are discharged, as it is imperative that you return to your primary care physician (or establish a relationship with a primary care physician if you do not have one) for your post hospital discharge needs so that they can reassess your need for medications and monitor your lab values.  Time coordinating discharge: 40 minutes  SIGNED:  Marzetta Board, MD, PhD 03/05/2022, 7:46 AM

## 2022-03-05 NOTE — Progress Notes (Signed)
RN has attempted x 3 to call report to [facility]. RN has left number for call facility to call back for report

## 2022-03-05 NOTE — Progress Notes (Signed)
Derek [name] has called back and report given to this facility. Pt discharged to SNF via ambulance.

## 2022-03-09 DIAGNOSIS — E440 Moderate protein-calorie malnutrition: Secondary | ICD-10-CM | POA: Diagnosis not present

## 2022-03-09 DIAGNOSIS — G4733 Obstructive sleep apnea (adult) (pediatric): Secondary | ICD-10-CM | POA: Diagnosis not present

## 2022-03-09 DIAGNOSIS — J690 Pneumonitis due to inhalation of food and vomit: Secondary | ICD-10-CM | POA: Diagnosis not present

## 2022-03-09 DIAGNOSIS — R5381 Other malaise: Secondary | ICD-10-CM | POA: Diagnosis not present

## 2022-03-09 DIAGNOSIS — I2510 Atherosclerotic heart disease of native coronary artery without angina pectoris: Secondary | ICD-10-CM | POA: Diagnosis not present

## 2022-03-09 DIAGNOSIS — J9601 Acute respiratory failure with hypoxia: Secondary | ICD-10-CM | POA: Diagnosis not present

## 2022-03-09 DIAGNOSIS — Z8619 Personal history of other infectious and parasitic diseases: Secondary | ICD-10-CM | POA: Diagnosis not present

## 2022-03-09 DIAGNOSIS — I10 Essential (primary) hypertension: Secondary | ICD-10-CM | POA: Diagnosis not present

## 2022-03-09 DIAGNOSIS — R262 Difficulty in walking, not elsewhere classified: Secondary | ICD-10-CM | POA: Diagnosis not present

## 2022-03-16 DIAGNOSIS — M069 Rheumatoid arthritis, unspecified: Secondary | ICD-10-CM | POA: Diagnosis not present

## 2022-03-16 DIAGNOSIS — R296 Repeated falls: Secondary | ICD-10-CM | POA: Diagnosis not present

## 2022-03-16 DIAGNOSIS — G4733 Obstructive sleep apnea (adult) (pediatric): Secondary | ICD-10-CM | POA: Diagnosis not present

## 2022-03-16 DIAGNOSIS — G3184 Mild cognitive impairment, so stated: Secondary | ICD-10-CM | POA: Diagnosis not present

## 2022-03-16 DIAGNOSIS — R2681 Unsteadiness on feet: Secondary | ICD-10-CM | POA: Diagnosis not present

## 2022-03-16 DIAGNOSIS — F32A Depression, unspecified: Secondary | ICD-10-CM | POA: Diagnosis not present

## 2022-03-16 DIAGNOSIS — S72402A Unspecified fracture of lower end of left femur, initial encounter for closed fracture: Secondary | ICD-10-CM | POA: Diagnosis not present

## 2022-03-16 DIAGNOSIS — J690 Pneumonitis due to inhalation of food and vomit: Secondary | ICD-10-CM | POA: Diagnosis not present

## 2022-03-16 DIAGNOSIS — E440 Moderate protein-calorie malnutrition: Secondary | ICD-10-CM | POA: Diagnosis not present

## 2022-03-19 DIAGNOSIS — E118 Type 2 diabetes mellitus with unspecified complications: Secondary | ICD-10-CM | POA: Diagnosis not present

## 2022-03-19 DIAGNOSIS — G4733 Obstructive sleep apnea (adult) (pediatric): Secondary | ICD-10-CM | POA: Diagnosis not present

## 2022-03-19 DIAGNOSIS — R1310 Dysphagia, unspecified: Secondary | ICD-10-CM | POA: Diagnosis not present

## 2022-03-19 DIAGNOSIS — I2510 Atherosclerotic heart disease of native coronary artery without angina pectoris: Secondary | ICD-10-CM | POA: Diagnosis not present

## 2022-03-19 DIAGNOSIS — F0390 Unspecified dementia without behavioral disturbance: Secondary | ICD-10-CM | POA: Diagnosis not present

## 2022-03-19 DIAGNOSIS — I10 Essential (primary) hypertension: Secondary | ICD-10-CM | POA: Diagnosis not present

## 2022-03-19 DIAGNOSIS — M069 Rheumatoid arthritis, unspecified: Secondary | ICD-10-CM | POA: Diagnosis not present

## 2022-03-19 DIAGNOSIS — J690 Pneumonitis due to inhalation of food and vomit: Secondary | ICD-10-CM | POA: Diagnosis not present

## 2022-03-19 DIAGNOSIS — J9601 Acute respiratory failure with hypoxia: Secondary | ICD-10-CM | POA: Diagnosis not present

## 2022-03-20 DIAGNOSIS — M069 Rheumatoid arthritis, unspecified: Secondary | ICD-10-CM | POA: Diagnosis not present

## 2022-03-20 DIAGNOSIS — R2681 Unsteadiness on feet: Secondary | ICD-10-CM | POA: Diagnosis not present

## 2022-03-20 DIAGNOSIS — E440 Moderate protein-calorie malnutrition: Secondary | ICD-10-CM | POA: Diagnosis not present

## 2022-03-20 DIAGNOSIS — F32A Depression, unspecified: Secondary | ICD-10-CM | POA: Diagnosis not present

## 2022-03-20 DIAGNOSIS — J690 Pneumonitis due to inhalation of food and vomit: Secondary | ICD-10-CM | POA: Diagnosis not present

## 2022-03-20 DIAGNOSIS — G3184 Mild cognitive impairment, so stated: Secondary | ICD-10-CM | POA: Diagnosis not present

## 2022-03-20 DIAGNOSIS — G4733 Obstructive sleep apnea (adult) (pediatric): Secondary | ICD-10-CM | POA: Diagnosis not present

## 2022-03-20 DIAGNOSIS — R296 Repeated falls: Secondary | ICD-10-CM | POA: Diagnosis not present

## 2022-03-20 DIAGNOSIS — S72402A Unspecified fracture of lower end of left femur, initial encounter for closed fracture: Secondary | ICD-10-CM | POA: Diagnosis not present

## 2022-03-22 DIAGNOSIS — R296 Repeated falls: Secondary | ICD-10-CM | POA: Diagnosis not present

## 2022-03-22 DIAGNOSIS — G3184 Mild cognitive impairment, so stated: Secondary | ICD-10-CM | POA: Diagnosis not present

## 2022-03-22 DIAGNOSIS — R2681 Unsteadiness on feet: Secondary | ICD-10-CM | POA: Diagnosis not present

## 2022-03-22 DIAGNOSIS — J690 Pneumonitis due to inhalation of food and vomit: Secondary | ICD-10-CM | POA: Diagnosis not present

## 2022-03-22 DIAGNOSIS — S72402A Unspecified fracture of lower end of left femur, initial encounter for closed fracture: Secondary | ICD-10-CM | POA: Diagnosis not present

## 2022-03-22 DIAGNOSIS — F32A Depression, unspecified: Secondary | ICD-10-CM | POA: Diagnosis not present

## 2022-03-22 DIAGNOSIS — M069 Rheumatoid arthritis, unspecified: Secondary | ICD-10-CM | POA: Diagnosis not present

## 2022-03-22 DIAGNOSIS — E440 Moderate protein-calorie malnutrition: Secondary | ICD-10-CM | POA: Diagnosis not present

## 2022-03-22 DIAGNOSIS — G4733 Obstructive sleep apnea (adult) (pediatric): Secondary | ICD-10-CM | POA: Diagnosis not present

## 2022-03-25 DIAGNOSIS — Z8701 Personal history of pneumonia (recurrent): Secondary | ICD-10-CM | POA: Diagnosis not present

## 2022-03-25 DIAGNOSIS — Z8781 Personal history of (healed) traumatic fracture: Secondary | ICD-10-CM | POA: Diagnosis not present

## 2022-03-25 DIAGNOSIS — R5381 Other malaise: Secondary | ICD-10-CM | POA: Diagnosis not present

## 2022-04-02 DIAGNOSIS — R1312 Dysphagia, oropharyngeal phase: Secondary | ICD-10-CM | POA: Diagnosis not present

## 2022-04-02 DIAGNOSIS — F4312 Post-traumatic stress disorder, chronic: Secondary | ICD-10-CM | POA: Diagnosis not present

## 2022-04-06 DIAGNOSIS — Z8701 Personal history of pneumonia (recurrent): Secondary | ICD-10-CM | POA: Diagnosis not present

## 2022-04-18 DIAGNOSIS — M6281 Muscle weakness (generalized): Secondary | ICD-10-CM | POA: Diagnosis not present

## 2022-04-18 DIAGNOSIS — R262 Difficulty in walking, not elsewhere classified: Secondary | ICD-10-CM | POA: Diagnosis not present

## 2022-05-07 DIAGNOSIS — F4312 Post-traumatic stress disorder, chronic: Secondary | ICD-10-CM | POA: Diagnosis not present

## 2022-05-07 DIAGNOSIS — R1312 Dysphagia, oropharyngeal phase: Secondary | ICD-10-CM | POA: Diagnosis not present

## 2022-05-12 DIAGNOSIS — E1169 Type 2 diabetes mellitus with other specified complication: Secondary | ICD-10-CM | POA: Diagnosis not present

## 2022-05-12 DIAGNOSIS — Z8701 Personal history of pneumonia (recurrent): Secondary | ICD-10-CM | POA: Diagnosis not present

## 2022-05-12 DIAGNOSIS — Z87898 Personal history of other specified conditions: Secondary | ICD-10-CM | POA: Diagnosis not present

## 2022-05-12 DIAGNOSIS — M109 Gout, unspecified: Secondary | ICD-10-CM | POA: Diagnosis not present

## 2022-05-12 DIAGNOSIS — M069 Rheumatoid arthritis, unspecified: Secondary | ICD-10-CM | POA: Diagnosis not present

## 2022-05-12 DIAGNOSIS — G4733 Obstructive sleep apnea (adult) (pediatric): Secondary | ICD-10-CM | POA: Diagnosis not present

## 2022-05-12 DIAGNOSIS — I10 Essential (primary) hypertension: Secondary | ICD-10-CM | POA: Diagnosis not present

## 2022-05-19 DIAGNOSIS — M6281 Muscle weakness (generalized): Secondary | ICD-10-CM | POA: Diagnosis not present

## 2022-05-19 DIAGNOSIS — R262 Difficulty in walking, not elsewhere classified: Secondary | ICD-10-CM | POA: Diagnosis not present

## 2022-05-20 DIAGNOSIS — M0579 Rheumatoid arthritis with rheumatoid factor of multiple sites without organ or systems involvement: Secondary | ICD-10-CM | POA: Diagnosis not present

## 2022-05-20 DIAGNOSIS — Z6823 Body mass index (BMI) 23.0-23.9, adult: Secondary | ICD-10-CM | POA: Diagnosis not present

## 2022-05-20 DIAGNOSIS — M1A09X0 Idiopathic chronic gout, multiple sites, without tophus (tophi): Secondary | ICD-10-CM | POA: Diagnosis not present

## 2022-06-19 DIAGNOSIS — R262 Difficulty in walking, not elsewhere classified: Secondary | ICD-10-CM | POA: Diagnosis not present

## 2022-06-19 DIAGNOSIS — M6281 Muscle weakness (generalized): Secondary | ICD-10-CM | POA: Diagnosis not present

## 2022-08-23 DIAGNOSIS — M0579 Rheumatoid arthritis with rheumatoid factor of multiple sites without organ or systems involvement: Secondary | ICD-10-CM | POA: Diagnosis not present

## 2023-02-12 ENCOUNTER — Emergency Department (HOSPITAL_COMMUNITY): Payer: No Typology Code available for payment source

## 2023-02-12 ENCOUNTER — Observation Stay (HOSPITAL_COMMUNITY): Payer: No Typology Code available for payment source

## 2023-02-12 ENCOUNTER — Observation Stay (HOSPITAL_COMMUNITY)
Admission: EM | Admit: 2023-02-12 | Discharge: 2023-02-13 | Disposition: A | Discharge status: Home Health | Payer: No Typology Code available for payment source | Attending: Internal Medicine | Admitting: Internal Medicine

## 2023-02-12 ENCOUNTER — Other Ambulatory Visit: Payer: Self-pay

## 2023-02-12 ENCOUNTER — Encounter (HOSPITAL_COMMUNITY): Payer: Self-pay

## 2023-02-12 DIAGNOSIS — I10 Essential (primary) hypertension: Secondary | ICD-10-CM | POA: Diagnosis present

## 2023-02-12 DIAGNOSIS — E1169 Type 2 diabetes mellitus with other specified complication: Secondary | ICD-10-CM | POA: Diagnosis not present

## 2023-02-12 DIAGNOSIS — Z96641 Presence of right artificial hip joint: Secondary | ICD-10-CM | POA: Diagnosis not present

## 2023-02-12 DIAGNOSIS — F0390 Unspecified dementia without behavioral disturbance: Secondary | ICD-10-CM | POA: Diagnosis present

## 2023-02-12 DIAGNOSIS — Z951 Presence of aortocoronary bypass graft: Secondary | ICD-10-CM | POA: Diagnosis not present

## 2023-02-12 DIAGNOSIS — R569 Unspecified convulsions: Secondary | ICD-10-CM | POA: Diagnosis not present

## 2023-02-12 DIAGNOSIS — E876 Hypokalemia: Secondary | ICD-10-CM | POA: Diagnosis not present

## 2023-02-12 DIAGNOSIS — M069 Rheumatoid arthritis, unspecified: Secondary | ICD-10-CM | POA: Diagnosis present

## 2023-02-12 DIAGNOSIS — R9431 Abnormal electrocardiogram [ECG] [EKG]: Secondary | ICD-10-CM | POA: Insufficient documentation

## 2023-02-12 DIAGNOSIS — Z7984 Long term (current) use of oral hypoglycemic drugs: Secondary | ICD-10-CM | POA: Insufficient documentation

## 2023-02-12 DIAGNOSIS — R55 Syncope and collapse: Principal | ICD-10-CM | POA: Diagnosis present

## 2023-02-12 DIAGNOSIS — G4733 Obstructive sleep apnea (adult) (pediatric): Secondary | ICD-10-CM

## 2023-02-12 DIAGNOSIS — I2510 Atherosclerotic heart disease of native coronary artery without angina pectoris: Secondary | ICD-10-CM | POA: Diagnosis not present

## 2023-02-12 DIAGNOSIS — E785 Hyperlipidemia, unspecified: Secondary | ICD-10-CM | POA: Insufficient documentation

## 2023-02-12 DIAGNOSIS — Z79899 Other long term (current) drug therapy: Secondary | ICD-10-CM | POA: Diagnosis not present

## 2023-02-12 DIAGNOSIS — M109 Gout, unspecified: Secondary | ICD-10-CM | POA: Diagnosis present

## 2023-02-12 LAB — COMPREHENSIVE METABOLIC PANEL
ALT: 29 U/L (ref 0–44)
AST: 42 U/L — ABNORMAL HIGH (ref 15–41)
Albumin: 3.3 g/dL — ABNORMAL LOW (ref 3.5–5.0)
Alkaline Phosphatase: 62 U/L (ref 38–126)
Anion gap: 11 (ref 5–15)
BUN: 14 mg/dL (ref 8–23)
CO2: 25 mmol/L (ref 22–32)
Calcium: 9.1 mg/dL (ref 8.9–10.3)
Chloride: 104 mmol/L (ref 98–111)
Creatinine, Ser: 1.26 mg/dL — ABNORMAL HIGH (ref 0.61–1.24)
GFR, Estimated: 60 mL/min — ABNORMAL LOW (ref >60)
Glucose, Bld: 112 mg/dL — ABNORMAL HIGH (ref 70–99)
Potassium: 4 mmol/L (ref 3.5–5.1)
Sodium: 140 mmol/L (ref 135–145)
Total Bilirubin: 0.4 mg/dL (ref 0.3–1.2)
Total Protein: 7.4 g/dL (ref 6.5–8.1)

## 2023-02-12 LAB — CBC
HCT: 43.8 % (ref 39.0–52.0)
Hemoglobin: 14.5 g/dL (ref 13.0–17.0)
MCH: 35.2 pg — ABNORMAL HIGH (ref 26.0–34.0)
MCHC: 33.1 g/dL (ref 30.0–36.0)
MCV: 106.3 fL — ABNORMAL HIGH (ref 80.0–100.0)
Platelets: 185 10*3/uL (ref 150–400)
RBC: 4.12 MIL/uL — ABNORMAL LOW (ref 4.22–5.81)
RDW: 14.7 % (ref 11.5–15.5)
WBC: 7.5 10*3/uL (ref 4.0–10.5)
nRBC: 0.3 % — ABNORMAL HIGH (ref 0.0–0.2)

## 2023-02-12 LAB — CBG MONITORING, ED: Glucose-Capillary: 98 mg/dL (ref 70–99)

## 2023-02-12 LAB — TROPONIN I (HIGH SENSITIVITY)
Troponin I (High Sensitivity): 5 ng/L (ref <18)
Troponin I (High Sensitivity): 4 ng/L (ref <18)

## 2023-02-12 LAB — GLUCOSE, CAPILLARY
Glucose-Capillary: 85 mg/dL (ref 70–99)
Glucose-Capillary: 180 mg/dL — ABNORMAL HIGH (ref 70–99)

## 2023-02-12 MED ORDER — ONDANSETRON HCL 4 MG PO TABS: 4.0000 mg | ORAL_TABLET | Freq: Four times a day (QID) | ORAL | Status: DC | PRN

## 2023-02-12 MED ORDER — ONDANSETRON HCL 4 MG/2ML IJ SOLN: 4.0000 mg | Freq: Four times a day (QID) | INTRAMUSCULAR | Status: DC | PRN

## 2023-02-12 MED ORDER — FLUOXETINE HCL 20 MG PO CAPS
20.0000 mg | ORAL_CAPSULE | Freq: Every day | ORAL | Status: DC
  Administered 2023-02-12 – 2023-02-13 (×2): 20 mg via ORAL
  Filled 2023-02-12 (×2): qty 1

## 2023-02-12 MED ORDER — ASPIRIN 81 MG PO TBEC
81.0000 mg | DELAYED_RELEASE_TABLET | Freq: Every day | ORAL | Status: DC
  Administered 2023-02-12 – 2023-02-13 (×2): 81 mg via ORAL
  Filled 2023-02-12 (×2): qty 1

## 2023-02-12 MED ORDER — ALLOPURINOL 300 MG PO TABS
300.0000 mg | ORAL_TABLET | Freq: Every day | ORAL | Status: DC
  Administered 2023-02-12 – 2023-02-13 (×2): 300 mg via ORAL
  Filled 2023-02-12 (×2): qty 1

## 2023-02-12 MED ORDER — TRAZODONE HCL 50 MG PO TABS: 50.0000 mg | ORAL_TABLET | Freq: Every evening | ORAL | Status: DC | PRN

## 2023-02-12 MED ORDER — ENOXAPARIN SODIUM 40 MG/0.4ML IJ SOSY
40.0000 mg | PREFILLED_SYRINGE | INTRAMUSCULAR | Status: DC
  Administered 2023-02-12: 40 mg via SUBCUTANEOUS
  Filled 2023-02-12: qty 0.4

## 2023-02-12 MED ORDER — ROSUVASTATIN CALCIUM 20 MG PO TABS
20.0000 mg | ORAL_TABLET | Freq: Every day | ORAL | Status: DC
  Administered 2023-02-12: 20 mg via ORAL
  Filled 2023-02-12: qty 1

## 2023-02-12 MED ORDER — ACETAMINOPHEN 650 MG RE SUPP: 650.0000 mg | Freq: Four times a day (QID) | RECTAL | Status: DC | PRN

## 2023-02-12 MED ORDER — SODIUM CHLORIDE 0.9% FLUSH
3.0000 mL | Freq: Two times a day (BID) | INTRAVENOUS | Status: DC
  Administered 2023-02-12 – 2023-02-13 (×3): 3 mL via INTRAVENOUS

## 2023-02-12 MED ORDER — INSULIN ASPART 100 UNIT/ML IJ SOLN
0.0000 [IU] | Freq: Three times a day (TID) | INTRAMUSCULAR | Status: DC
  Administered 2023-02-13: 3 [IU] via SUBCUTANEOUS

## 2023-02-12 MED ORDER — ACETAMINOPHEN 325 MG PO TABS: 650.0000 mg | ORAL_TABLET | Freq: Four times a day (QID) | ORAL | Status: DC | PRN

## 2023-02-12 MED ORDER — METOPROLOL SUCCINATE ER 25 MG PO TB24
12.5000 mg | ORAL_TABLET | Freq: Every morning | ORAL | Status: DC
  Administered 2023-02-13: 12.5 mg via ORAL
  Filled 2023-02-12: qty 1

## 2023-02-12 NOTE — ED Triage Notes (Signed)
Pt BIB [facility] from home d/t a syncopal episode. Wife was helping w shower, pt did not look so good, wife had him sitting down on the floor, then lost consciousness. No fall. No injuries noted.  Initial palpated pressure 80. Pt was more altered en route w EMS. Hx TIA.   given by EMS  BP 138/74 Brady 46- 52 which is normal for pt per wife 100% on RA  Cbg 123

## 2023-02-12 NOTE — Progress Notes (Signed)
EEG complete - results pending 

## 2023-02-12 NOTE — Progress Notes (Signed)
Per rn pt is moving 3e13

## 2023-02-12 NOTE — Progress Notes (Signed)
Pt states he does have a Home CPAP unit but does not use it. Pt declined hospital CPAP unit. Pt does not want nocturnal CPAP therapy during this hospital admission. No unit in room. No distress @ this time on RA. RT to follow.

## 2023-02-12 NOTE — ED Provider Notes (Signed)
[facility]ENC[facility]PARTMENT AT [facility] Provider Note   CSN: [id number] Arrival date & time: 02/12/23  1004     History  Chief Complaint  Patient presents with   Loss of Consciousness    Derek [name] is a 77 y.o. male.  HPI Level 5 caveat secondary to syncopal episode and patient does not remember what occurred History obtained from EMS and nurse 77 year old male history of TIA, diabetes, coronary artery disease presents today via EMS with report that he had a syncopal episode.  They report that his wife was helping him in the shower when he became very weak and seemed to be leaning back.  She lowered him to the ground and he lost consciousness.  She reported that he did not fall and there were no injuries.  EMS reports that his initial blood pressure was 80.  He was altered per EMS and route but was awake and able to speak to them.  BG reported to be 123. Wife now at bedside.  She states that patient was in his usual state of health this morning.  He did not have any breakfast but this was usual.  She was giving a shower when he began to lean backwards.  She asked him if he was okay and he continued to lean backwards.  She pivoted him and sat him down on the seat in the shower.  He then lost consciousness.  She states there are some twitching of the left side of his mouth.  The entire event lasted around 3 minutes and then he was awake and able to follow directions although he was somewhat less responsive than his usual self.  She states he has had some district similar episode.  She denies that he has had any chest pain, fever, fall or injury.     Home Medications Prior to Admission medications   Medication Sig Start Date End Date Taking? Authorizing Provider  acetaminophen (TYLENOL) 650 MG CR tablet Take 650 mg by mouth 2 (two) times daily.    [provider]  docusate sodium (COLACE) 100 MG capsule Take 1 capsule (100 mg total) by mouth 2 (two) times  daily. 02/16/22   Rai, Ripudeep K, MD  empagliflozin (JARDIANCE) 25 MG TABS tablet Take 12.5 mg by mouth every morning.    [provider]  feeding supplement (ENSURE ENLIVE / ENSURE PLUS) LIQD Take 237 mLs by mouth 2 (two) times daily between meals. 02/17/22   Lewie Chamber, MD  Folic Acid-Vit B6-Vit B12 (FOLBEE) 2.5-25-1 MG TABS tablet Take 1 tablet by mouth daily.    [provider]  furosemide (LASIX) 20 MG tablet Take 0.5 tablets (10 mg total) by mouth daily as needed for fluid or edema. 03/05/22   Leatha Gilding, MD  HYDROcodone-acetaminophen (NORCO/VICODIN) 5-325 MG tablet Take 1 tablet by mouth every 6 (six) hours as needed for moderate pain or severe pain. 03/05/22 03/05/23  Leatha Gilding, MD  metFORMIN (GLUCOPHAGE-XR) 500 MG 24 hr tablet Take 1,000 mg by mouth daily with breakfast. 08/03/21   [provider]  methotrexate (RHEUMATREX) 2.5 MG tablet Take 10 mg by mouth 2 (two) times a week. Friday & Saturday    [provider]  metoprolol succinate (TOPROL-XL) 25 MG 24 hr tablet Take 12.5 mg by mouth every morning. 02/08/20   [provider]  polyethylene glycol (MIRALAX / GLYCOLAX) 17 g packet Take 17 g by mouth daily. 02/16/22   Cathren Harsh, MD  rosuvastatin (CRESTOR) 20 MG tablet Take 20 mg by mouth at bedtime. 12/23/21   [provider]      Allergies    Patient has no known allergies.    Review of Systems   Review of Systems  Physical Exam Updated Vital Signs BP 118/68   Pulse (!) 52   Temp 98.1 F (36.7 C) (Oral)   Resp (!) 25   SpO2 100%  Physical Exam Vitals reviewed.  Constitutional:      Appearance: Normal appearance.  HENT:     Head: Normocephalic and atraumatic.     Right Ear: External ear normal.     Left Ear: External ear normal.     Nose: Nose normal.     Mouth/Throat:     Pharynx: Oropharynx is clear.  Eyes:     Extraocular Movements: Extraocular movements intact.     Pupils: Pupils are equal,  round, and reactive to light.  Cardiovascular:     Rate and Rhythm: Normal rate and regular rhythm.     Pulses: Normal pulses.     Heart sounds: Normal heart sounds.  Pulmonary:     Breath sounds: Normal breath sounds.  Abdominal:     General: Abdomen is flat. Bowel sounds are normal.     Palpations: Abdomen is soft.  Musculoskeletal:        General: Normal range of motion.     Cervical back: Normal range of motion.  Skin:    General: Skin is warm and dry.     Capillary Refill: Capillary refill takes less than 2 seconds.  Neurological:     General: No focal deficit present.     Mental Status: He is alert.     Cranial Nerves: No cranial nerve deficit.     Motor: No weakness.  Psychiatric:        Mood and Affect: Mood normal.     ED Results / Procedures / Treatments   Labs (all labs ordered are listed, but only abnormal results are displayed) Labs Reviewed  CBC - Abnormal; Notable for the following components:      Result Value   RBC 4.12 (*)    MCV 106.3 (*)    MCH 35.2 (*)    nRBC 0.3 (*)    All other components within normal limits  COMPREHENSIVE METABOLIC PANEL - Abnormal; Notable for the following components:   Glucose, Bld 112 (*)    Creatinine, Ser 1.26 (*)    Albumin 3.3 (*)    AST 42 (*)    GFR, Estimated 60 (*)    All other components within normal limits  CBG MONITORING, ED  TROPONIN I (HIGH SENSITIVITY)  TROPONIN I (HIGH SENSITIVITY)    EKG EKG Interpretation  Date/Time:  Saturday Feb 12 2023 10:17:29 EDT Ventricular Rate:  55 PR Interval:  215 QRS Duration: 91 QT Interval:  695 QTC Calculation: 665 R Axis:   -74 Text Interpretation: Sinus rhythm Borderline prolonged PR interval Left anterior fascicular block Anterior infarct, old Nonspecific T abnormalities, lateral leads Prolonged QT interval Confirmed by Margarita Grizzle [id number]) on 02/12/2023 10:58:07 AM  Radiology DG Chest Port 1 View  Result Date: 02/12/2023 CLINICAL DATA:  Syncope. EXAM:  PORTABLE CHEST 1 VIEW COMPARISON:  Chest radiographs dated Feb 25, 2022 FINDINGS: The heart is enlarged with evidence of prior coronary artery bypass grafting. Low lung volumes with left basilar opacity suggesting atelectasis and/or small effusion. Thoracic spondylosis. IMPRESSION: Low lung volumes with left basilar opacity suggesting atelectasis and/or  small effusion. Stable cardiomegaly. Electronically Signed   By: Larose Hires D.O.   On: 02/12/2023 11:45   CT Head Wo Contrast  Result Date: 02/12/2023 CLINICAL DATA:  77 year old male with altered mental status. EXAM: CT HEAD WITHOUT CONTRAST TECHNIQUE: Contiguous axial images were obtained from the base of the skull through the vertex without intravenous contrast. RADIATION DOSE REDUCTION: This exam was performed according to the departmental dose-optimization program which includes automated exposure control, adjustment of the mA and/or kV according to patient size and/or use of iterative reconstruction technique. COMPARISON:  None Available. FINDINGS: Brain: No midline shift, ventriculomegaly, mass effect, evidence of mass lesion, intracranial hemorrhage or evidence of cortically based acute infarction. Confluent and widespread bilateral cerebral white matter hypodensity. Small area of chronic appearing cortical encephalomalacia right middle frontal gyrus (series 5, image 26). Deep gray matter nuclei, brainstem and cerebellum relatively spared. Vascular: Calcified atherosclerosis at the skull base. No suspicious intracranial vascular hyperdensity. Skull: No acute osseous abnormality identified. Sinuses/Orbits: Scattered mild paranasal sinus mucosal thickening or small mucous retention cysts. Tympanic cavities and mastoids are clear. Other: Calcified scalp vessel atherosclerosis. No acute orbit or scalp soft tissue finding. IMPRESSION: 1. No acute intracranial abnormality identified. 2. 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 3. Advanced calcified atherosclerosis, including scalp vessel involvement frequently seen with End stage renal disease. Electronically Signed   By: Odessa Fleming M.D.   On: 02/12/2023 11:29    Procedures .Critical Care  Performed by: Margarita Grizzle, MD Authorized by: Margarita Grizzle, MD   Critical care provider statement:    Critical care time (minutes):  30   Critical care was necessary to treat or prevent imminent or life-threatening deterioration of the following conditions:  Shock   Critical care was time spent personally by me on the following activities:  Development of treatment plan with patient or surrogate, discussions with consultants, evaluation of patient's response to treatment, examination of patient, ordering and review of laboratory studies, ordering and review of radiographic studies, ordering and performing treatments and interventions, pulse oximetry, re-evaluation of patient's condition and review of old charts     Medications Ordered in ED Medications - No data to display  ED Course/ Medical Decision Making/ A&P Clinical Course as of 02/12/23 1411  Sat Feb 12, 2023  1340 X-Soni Kegel reviewed and interpreted per radiologist interpretation basilar opacity suggesting atelectasis or small effusion [DR]  1341 Head CT reviewed interpreted without evidence of acute bleeding or mass radiologist interpretation notes chronic appearing anterior division right MCA territory infarct [DR]  1341 CBC reviewed and interpreted within normal limits Complete metabolic panel reviewed and interpreted significant for mild increased creatinine at 1.26 otherwise essentially within normal limits [DR]    Clinical Course User Index [DR] Margarita Grizzle, MD                             Medical Decision Making Amount and/or Complexity of Data Reviewed Labs: ordered. Radiology: ordered.   77 year old man who had episode of loss of consciousness in the shower today.   He had some hypotension afterwards which has rebounded without acute intervention. DDX includes but is not limited to 1 arrhythmia versus seizure, versus TIA, versus hypoglycemia, versus infection and sepsis versus acute coronary syndrome EKG without evidence of ST elevation MI and first troponin normal doubt that patient has acute coronary syndrome.  He is not currently complaining of any chest  pain Patient is afebrile with normal white blood cell count although he does have some area on his chest x-Velna Hedgecock that could be infiltrate versus atelectasis.  Patient is not currently coughing or dyspneic.  Doubt acute infection Per report patient's blood sugar has been normal.  He is diabetic.  It is possible that it was low and then increased prior to the first check.  However has remained stable here. Patient does have history of coronary artery disease and arrhythmia could have initiated similar symptoms.  There is no report of him being in arrhythmia prior to arrival here.  On the monitor here he is repent made in a normal sinus rhythm. Plan admission for ongoing evaluation and treatment. Patient was previously cared for by Dr. Kirby Funk who has since retired.  He is now going to the [place] for his primary care.  Care discussed with Dr. Ophelia Charter who will see for admission        Final Clinical Impression(s) / ED Diagnoses Final diagnoses:  Syncope and collapse    Rx / DC Orders ED Discharge Orders     None         Margarita Grizzle, MD 02/12/23 1411

## 2023-02-12 NOTE — ED Notes (Signed)
ED TO INPATIENT HANDOFF REPORT  ED Nurse Name and Phone #: Brett Canales [phone]  S Name/Age/Gender Derek [name] 77 y.o. male Room/Bed: 035C/035C  Code Status   Code Status: Prior  Home/SNF/Other Home Patient oriented to: self, place, time, and situation Is this baseline? Yes   Triage Complete: Triage complete  Chief Complaint Syncope and collapse [R55]  Triage Note Pt BIB [facility] from home d/t a syncopal episode. Wife was helping w shower, pt did not look so good, wife had him sitting down on the floor, then lost consciousness. No fall. No injuries noted.  Initial palpated pressure 80. Pt was more altered en route w EMS. Hx TIA.   given by EMS  BP 138/74 Huston Foley 46- 52 which is normal for pt per wife 100% on RA  Cbg 123    Allergies No Known Allergies  Level of Care/Admitting Diagnosis ED Disposition     ED Disposition  Admit   Condition  --   Comment  Hospital Area: [facility] [[id number]]  Level of Care: Telemetry Medical [104]  May place patient in observation at [facility] or [facility] if equivalent level of care is available:: Yes  Covid Evaluation: Asymptomatic - no recent exposure (last 10 days) testing not required  Diagnosis: Syncope and collapse [780.2.ICD-9-CM]  Admitting Physician: Jonah Blue [[id number]]  Attending Physician: Jonah Blue [[id number]]          B Medical/Surgery History 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   H/O: upper GI bleed 01/2001    related to nonsteroidal use, CLO test +(Med Tx- Prilosec, Amoxicillin, & Biaxin)   Hypertension    Insomnia    Mental disorder    PTSD   Peptic ulcer disease    history of gastric ulcer   [place] ([place] [facility])    [facility]   Sarcoidosis    late 1970s   Scleritis    Sleep apnea    cpap with O2 4L for 4 yrs   Tubular adenoma 2004    Past Surgical History:  Procedure Laterality Date   CARDIAC CATHETERIZATION     CORONARY ARTERY BYPASS GRAFT  12/02/2017   at [facility] Dr Ty Hilts Surgeon   EYE SURGERY  05   growth lft   HIP PINNING,CANNULATED Left 02/12/2022   Procedure: CANNULATED HIP PINNING;  Surgeon: Terance Hart, MD;  Location: [facility] ORS;  Service: Orthopedics;  Laterality: Left;   TOTAL HIP ARTHROPLASTY  06/13/2012   TOTAL HIP ARTHROPLASTY  06/13/2012   Procedure: TOTAL HIP ARTHROPLASTY ANTERIOR APPROACH;  Surgeon: Velna Ochs, MD;  Location: [facility] OR;  Service: Orthopedics;  Laterality: Right;  right anterior approach total hip arthroplasty     A IV Location/Drains/Wounds Patient Lines/Drains/Airways Status     Active Line/Drains/Airways     Name Placement date Placement time Site Days   Peripheral IV 02/12/23 18 G Anterior;Distal;Left;Upper Arm 02/12/23  1114  Arm  less than 1   External Urinary Catheter 02/20/22  2213  --  357   Incision 06/13/12 Hip Right 06/13/12  1646  -- 3896   Incision (Closed) 02/12/22 Hip Left 02/12/22  1237  -- 365   Pressure Injury 02/25/22 Buttocks Left;Mid Stage 2 -  Partial thickness loss of dermis presenting as a shallow open injury with a red, pink wound  bed without slough. Small, shallow, open, red area on left buttocks towards midline 02/25/22  0541  -- 352   Pressure Injury 02/28/22 Thigh Posterior;Proximal;Right;Lower Stage 2 -  Partial thickness loss of dermis presenting as a shallow open injury with a red, pink wound bed without slough. 02/28/22  1400  -- 349   Pressure Injury 03/04/22 Sacrum Mid Stage 2 -  Partial thickness loss of dermis presenting as a shallow open injury with a red, pink wound bed without slough. 03/04/22  0006  -- 345            Intake/Output Last 24 hours No intake or output data in the 24 hours ending 02/12/23 1500  Labs/Imaging Results for orders placed or performed during the hospital encounter of 02/12/23 (from the past 48 hour(s))   Troponin I (High Sensitivity)     Status: None   Collection Time: 02/12/23 11:11 AM  Result Value Ref Range   Troponin I (High Sensitivity) 5 <18 ng/L    Comment: (NOTE) Elevated high sensitivity troponin I (hsTnI) values and significant  changes across serial measurements may suggest ACS but many other  chronic and acute conditions are known to elevate hsTnI results.  Refer to the "Links" section for chest pain algorithms and additional  guidance. Performed at [facility], [address], [place], [place] [address]   CBG monitoring, ED     Status: None   Collection Time: 02/12/23 11:11 AM  Result Value Ref Range   Glucose-Capillary 98 70 - 99 mg/dL    Comment: Glucose reference range applies only to samples taken after fasting for at least 8 hours.  CBC     Status: Abnormal   Collection Time: 02/12/23 11:11 AM  Result Value Ref Range   WBC 7.5 4.0 - 10.5 K/uL   RBC 4.12 (L) 4.22 - 5.81 MIL/uL   Hemoglobin 14.5 13.0 - 17.0 g/dL   HCT 60.4 54.0 - 98.1 %   MCV 106.3 (H) 80.0 - 100.0 fL   MCH 35.2 (H) 26.0 - 34.0 pg   MCHC 33.1 30.0 - 36.0 g/dL   RDW 19.1 47.8 - 29.5 %   Platelets 185 150 - 400 K/uL   nRBC 0.3 (H) 0.0 - 0.2 %    Comment: Performed at [facility], [address], [place], [place] [address]  Comprehensive metabolic panel     Status: Abnormal   Collection Time: 02/12/23 11:11 AM  Result Value Ref Range   Sodium 140 135 - 145 mmol/L   Potassium 4.0 3.5 - 5.1 mmol/L   Chloride 104 98 - 111 mmol/L   CO2 25 22 - 32 mmol/L   Glucose, Bld 112 (H) 70 - 99 mg/dL    Comment: Glucose reference range applies only to samples taken after fasting for at least 8 hours.   BUN 14 8 - 23 mg/dL   Creatinine, Ser 8.65 (H) 0.61 - 1.24 mg/dL   Calcium 9.1 8.9 - 78.4 mg/dL   Total Protein 7.4 6.5 - 8.1 g/dL   Albumin 3.3 (L) 3.5 - 5.0 g/dL   AST 42 (H) 15 - 41 U/L   ALT 29 0 - 44 U/L   Alkaline Phosphatase 62 38 - 126 U/L   Total Bilirubin 0.4 0.3 - 1.2 mg/dL    GFR, Estimated 60 (L) >60 mL/min    Comment: (NOTE) Calculated using the CKD-EPI Creatinine Equation (2021)    Anion gap 11 5 - 15    Comment:  Performed at [facility], [address], [place], [place] [address]  Troponin I (High Sensitivity)     Status: None   Collection Time: 02/12/23  1:33 PM  Result Value Ref Range   Troponin I (High Sensitivity) 4 <18 ng/L    Comment: (NOTE) Elevated high sensitivity troponin I (hsTnI) values and significant  changes across serial measurements may suggest ACS but many other  chronic and acute conditions are known to elevate hsTnI results.  Refer to the "Links" section for chest pain algorithms and additional  guidance. Performed at [facility], [address] [place], [place] [address]    DG Chest Port 1 View  Result Date: 02/12/2023 CLINICAL DATA:  Syncope. EXAM: PORTABLE CHEST 1 VIEW COMPARISON:  Chest radiographs dated Feb 25, 2022 FINDINGS: The heart is enlarged with evidence of prior coronary artery bypass grafting. Low lung volumes with left basilar opacity suggesting atelectasis and/or small effusion. Thoracic spondylosis. IMPRESSION: Low lung volumes with left basilar opacity suggesting atelectasis and/or small effusion. Stable cardiomegaly. Electronically Signed   By: Larose Hires D.O.   On: 02/12/2023 11:45   CT Head Wo Contrast  Result Date: 02/12/2023 CLINICAL DATA:  77 year old male with altered mental status. EXAM: CT HEAD WITHOUT CONTRAST TECHNIQUE: Contiguous axial images were obtained from the base of the skull through the vertex without intravenous contrast. RADIATION DOSE REDUCTION: This exam was performed according to the departmental dose-optimization program which includes automated exposure control, adjustment of the mA and/or kV according to patient size and/or use of iterative reconstruction technique. COMPARISON:  None Available. FINDINGS: Brain: No midline shift, ventriculomegaly, mass effect, evidence of mass  lesion, intracranial hemorrhage or evidence of cortically based acute infarction. Confluent and widespread bilateral cerebral white matter hypodensity. Small area of chronic appearing cortical encephalomalacia right middle frontal gyrus (series 5, image 26). Deep gray matter nuclei, brainstem and cerebellum relatively spared. Vascular: Calcified atherosclerosis at the skull base. No suspicious intracranial vascular hyperdensity. Skull: No acute osseous abnormality identified. Sinuses/Orbits: Scattered mild paranasal sinus mucosal thickening or small mucous retention cysts. Tympanic cavities and mastoids are clear. Other: Calcified scalp vessel atherosclerosis. No acute orbit or scalp soft tissue finding. IMPRESSION: 1. No acute intracranial abnormality identified. 2. 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 3. Advanced calcified atherosclerosis, including scalp vessel involvement frequently seen with End stage renal disease. Electronically Signed   By: Odessa Fleming M.D.   On: 02/12/2023 11:29    Pending Labs Unresulted Labs (From admission, onward)    None       Vitals/Pain Today's Vitals   02/12/23 1020 02/12/23 1045 02/12/23 1300 02/12/23 1459  BP: 128/76 132/72 118/68   Pulse: (!) 55 (!) 57 (!) 52   Resp: (!) 22 16 (!) 25   Temp: 98.1 F (36.7 C)   97.9 F (36.6 C)  TempSrc: Oral   Oral  SpO2: 100% 100% 100%     Isolation Precautions No active isolations  Medications Medications - No data to display  Mobility walks with person assist     Focused Assessments Neuro Assessment Handoff:  Swallow screen pass?  Cardiac Rhythm: Sinus bradycardia       Neuro Assessment: Within Defined Limits Neuro Checks:      Has TPA been given? No If patient is a Neuro Trauma and patient is going to OR before floor call report to 4N Charge nurse: [phone] or [phone]   R Recommendations: See Admitting  Provider  Note  Report given to:   Additional Notes: Needs bathroom assistance

## 2023-02-12 NOTE — H&P (Signed)
History and Physical    Patient: Derek [name] WUJ:[id number] DOB: 1946-02-17 DOA: 02/12/2023 DOS: the patient was seen and examined on 02/12/2023 PCP: [facility]  Patient coming from: Home - lives with wife; NOK: Wife, [phone]   Chief Complaint: Syncope  HPI: Derek [name] is a 77 y.o. male with medical history significant of CAD, DM, HTN, PTSD, dementia, sarcoidosis, and OSA on CPAP presenting with syncope. This AM, they were doing the usual routine.  While in the shower, he seemed to be drifting backwards.  She pushed him forward and he leaned back again.  She turned him around and seated him on the bench.  He leaned back against the wall and his mouth looked twisted, chewing and shaking.  LE not involved, but upper extremities, looked like he would gag.  It lasted no longer than 3-5 minutes.  He seemed to come to his senses some, no communication much.  She called 911 and he was able to follow 911 directions.    ER Course:  Wife helped his shower, he had pre-syncope and then syncope.  Episode lasted 3 minutes, not post-ictal.  EMS found BP 80.       Review of Systems: As mentioned in the history of present illness. All other systems reviewed and are negative. 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   H/O: upper GI bleed 01/2001    related to nonsteroidal use, CLO test +(Med Tx- Prilosec, Amoxicillin, & Biaxin)   Hypertension    Insomnia    Mental disorder    PTSD   Peptic ulcer disease    history of gastric ulcer   [facility] ([facility])    [facility]   Sarcoidosis    late 1970s   Scleritis    Sleep apnea    cpap with O2 4L for 4 yrs   Tubular adenoma 2004   Past Surgical History:  Procedure Laterality Date   CARDIAC CATHETERIZATION     CORONARY ARTERY BYPASS GRAFT  12/02/2017   at [facility] Dr Ty Hilts Surgeon   EYE  SURGERY  05   growth lft   HIP PINNING,CANNULATED Left 02/12/2022   Procedure: CANNULATED HIP PINNING;  Surgeon: Terance Hart, MD;  Location: [facility];  Service: Orthopedics;  Laterality: Left;   TOTAL HIP ARTHROPLASTY  06/13/2012   TOTAL HIP ARTHROPLASTY  06/13/2012   Procedure: TOTAL HIP ARTHROPLASTY ANTERIOR APPROACH;  Surgeon: Velna Ochs, MD;  Location: [facility] OR;  Service: Orthopedics;  Laterality: Right;  right anterior approach total hip arthroplasty   Social History:  reports that he has never smoked. He has never used smokeless tobacco. He reports current alcohol use. He reports that he does not use drugs.  No Known Allergies  Family History  Problem Relation Age of Onset   Stroke Mother    Hypertension Mother    Hypertension Father    Hypertension Brother    Gout Brother    Deep vein thrombosis Brother     Prior to Admission medications   Medication Sig Start Date End Date Taking? Authorizing Provider  acetaminophen (TYLENOL) 650 MG CR tablet Take 650 mg by mouth 2 (two) times daily.    [provider]  docusate sodium (COLACE) 100 MG capsule Take 1 capsule (100 mg total) by mouth 2 (two) times daily.  02/16/22   Rai, Ripudeep K, MD  empagliflozin (JARDIANCE) 25 MG TABS tablet Take 12.5 mg by mouth every morning.    [provider]  feeding supplement (ENSURE ENLIVE / ENSURE PLUS) LIQD Take 237 mLs by mouth 2 (two) times daily between meals. 02/17/22   Lewie Chamber, MD  Folic Acid-Vit B6-Vit B12 (FOLBEE) 2.5-25-1 MG TABS tablet Take 1 tablet by mouth daily.    [provider]  furosemide (LASIX) 20 MG tablet Take 0.5 tablets (10 mg total) by mouth daily as needed for fluid or edema. 03/05/22   Leatha Gilding, MD  HYDROcodone-acetaminophen (NORCO/VICODIN) 5-325 MG tablet Take 1 tablet by mouth every 6 (six) hours as needed for moderate pain or severe pain. 03/05/22 03/05/23  Leatha Gilding, MD  metFORMIN (GLUCOPHAGE-XR) 500 MG 24 hr tablet Take  1,000 mg by mouth daily with breakfast. 08/03/21   [provider]  methotrexate (RHEUMATREX) 2.5 MG tablet Take 10 mg by mouth 2 (two) times a week. Friday & Saturday    [provider]  metoprolol succinate (TOPROL-XL) 25 MG 24 hr tablet Take 12.5 mg by mouth every morning. 02/08/20   [provider]  polyethylene glycol (MIRALAX / GLYCOLAX) 17 g packet Take 17 g by mouth daily. 02/16/22   Rai, Ripudeep K, MD  rosuvastatin (CRESTOR) 20 MG tablet Take 20 mg by mouth at bedtime. 12/23/21   [provider]    Physical Exam: Vitals:   02/12/23 1020 02/12/23 1045 02/12/23 1300 02/12/23 1459  BP: 128/76 132/72 118/68   Pulse: (!) 55 (!) 57 (!) 52   Resp: (!) 22 16 (!) 25   Temp: 98.1 F (36.7 C)   97.9 F (36.6 C)  TempSrc: Oral   Oral  SpO2: 100% 100% 100%    General:  Appears calm and comfortable and is in NAD Eyes:  EOMI, normal lids, iris ENT:  grossly normal hearing, lips & tongue, mmm Neck:  no LAD, masses or thyromegaly Cardiovascular:  RRR, no r/g, 3-4/6 systolic murmur. No LE edema.  Respiratory:   CTA bilaterally with no wheezes/rales/rhonchi.  Normal respiratory effort. Abdomen:  soft, NT, ND Back:   normal alignment, no CVAT Skin:  no rash or induration seen on limited exam Musculoskeletal:  grossly normal tone BUE/BLE, good ROM, no bony abnormality Psychiatric:  grossly normal mood and affect with ?cognitive slowing, speech fluent and appropriate, AOx3 Neurologic:  CN 2-12 grossly intact, moves all extremities in coordinated fashion   Radiological Exams on Admission: Independently reviewed - see discussion in A/P where applicable  DG Chest Port 1 View  Result Date: 02/12/2023 CLINICAL DATA:  Syncope. EXAM: PORTABLE CHEST 1 VIEW COMPARISON:  Chest radiographs dated Feb 25, 2022 FINDINGS: The heart is enlarged with evidence of prior coronary artery bypass grafting. Low lung volumes with left basilar opacity suggesting atelectasis and/or  small effusion. Thoracic spondylosis. IMPRESSION: Low lung volumes with left basilar opacity suggesting atelectasis and/or small effusion. Stable cardiomegaly. Electronically Signed   By: Larose Hires D.O.   On: 02/12/2023 11:45   CT Head Wo Contrast  Result Date: 02/12/2023 CLINICAL DATA:  77 year old male with altered mental status. EXAM: CT HEAD WITHOUT CONTRAST TECHNIQUE: Contiguous axial images were obtained from the base of the skull through the vertex without intravenous contrast. RADIATION DOSE REDUCTION: This exam was performed according to the departmental dose-optimization program which includes automated exposure control, adjustment of the mA and/or kV according to patient size and/or use of iterative reconstruction technique.  COMPARISON:  None Available. FINDINGS: Brain: No midline shift, ventriculomegaly, mass effect, evidence of mass lesion, intracranial hemorrhage or evidence of cortically based acute infarction. Confluent and widespread bilateral cerebral white matter hypodensity. Small area of chronic appearing cortical encephalomalacia right middle frontal gyrus (series 5, image 26). Deep gray matter nuclei, brainstem and cerebellum relatively spared. Vascular: Calcified atherosclerosis at the skull base. No suspicious intracranial vascular hyperdensity. Skull: No acute osseous abnormality identified. Sinuses/Orbits: Scattered mild paranasal sinus mucosal thickening or small mucous retention cysts. Tympanic cavities and mastoids are clear. Other: Calcified scalp vessel atherosclerosis. No acute orbit or scalp soft tissue finding. IMPRESSION: 1. No acute intracranial abnormality identified. 2. 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 3. Advanced calcified atherosclerosis, including scalp vessel involvement frequently seen with End stage renal disease. Electronically Signed   By: Odessa Fleming M.D.   On: 02/12/2023 11:29    EKG:  Independently reviewed.  Sinus bradycardia with rate 55; prolonged QTc 665; low voltage; nonspecific ST changes with no evidence of acute ischemia   Labs on Admission: I have personally reviewed the available labs and imaging studies at the time of the admission.  Pertinent labs:    Glucose 112 BUN 14/Creatinine 1.26/GFR 60 Albumin 3.3 HS troponin 5 Unremarkable CBC   Assessment and Plan: Principal Problem:   Syncope and collapse Active Problems:   Dementia without behavioral disturbance (HCC)   History of coronary artery bypass graft   OSA on CPAP   Controlled NIDDM-2 with hyperlipidemia   Gout   Benign essential hypertension   Rheumatoid arthritis (HCC)    Syncope -Patient seemingly normal prior to getting into the shower and then had pre-cursor symptoms followed by true syncope with ? Convulsive activty -Likely seizure vs. Vasovagal -However, he also has an impressive murmur without recent echo -This patient is at moderate/high risk for serious outcome and thus should be observed overnight on telemetry in the hospital. -Orthostatic vital signs now and in AM -EEG ordered -2d echo is ordered -Neuro checks  -PT/OT eval and treat  Prolonged QT -Will attempt to avoid QT-prolonging medications such as PPI, nausea meds, SSRIs -Repeat EKG in AM   CAD -h/o CABG -No report of CP -Continue ASA -Checking echo  DM -Recent A1c was 5.6, good control -hold Glucophage, Jardiance -Cover with moderate-scale SSI   HTN -Continue Toprol XL  HLD -Continue rosuvastatin  PTSD/dementia -SLP evaluation for cognitive/language assessment -Continue trazodone -Delirium precautions  OSA -Continue CPAP  Gout -Continue allopurinol  RA -Hold methotrexate for now but likely ok to resume soon     Advance Care Planning:   Code Status: Full Code - Code status was discussed with the patient and wife at the time of admission.  The patient would want to receive full resuscitative  measures at this time.   Consults: PT/OT/SLP; TOC team  DVT Prophylaxis: Lovenox  Family Communication: Wife was present throughout evaluation  Severity of Illness: The appropriate patient status for this patient is OBSERVATION. Observation status is judged to be reasonable and necessary in order to provide the required intensity of service to ensure the patient's safety. The patient's presenting symptoms, physical exam findings, and initial radiographic and laboratory data in the context of their medical condition is felt to place them at decreased risk for further clinical deterioration. Furthermore, it is anticipated that the patient will be medically stable for discharge from the hospital within 2 midnights of admission.   Author: Jonah Blue, MD  02/12/2023 3:24 PM  For on call review [email or web address].

## 2023-02-12 NOTE — Procedures (Signed)
Patient Name: Derek [name]  MRN: [id number]  Epilepsy Attending: Charlsie Quest  Referring Physician/Provider: Jonah Blue, MD  Date: 02/12/2023 Duration: 21.27 mins  Patient history: 77yo M with syncope getting eeg to evaluate for seizure  Level of alertness: Awake  AEDs during EEG study: None  Technical aspects: This EEG study was done with scalp electrodes positioned according to the 10-20 International system of electrode placement. Electrical activity was reviewed with band pass filter of 1-70Hz , sensitivity of 7 uV/mm, display speed of 32mm/sec with a 60Hz  notched filter applied as appropriate. EEG data were recorded continuously and digitally stored.  Video monitoring was available and reviewed as appropriate.  Description: The posterior dominant rhythm consists of 8 Hz activity of moderate voltage (25-35 uV) seen predominantly in posterior head regions, symmetric and reactive to eye opening and eye closing. EEG showed intermittent 2-3hz  Hz theta-delta slowing in left and right temporal region. Physiologic photic driving was not seen during photic stimulation.  Hyperventilation was not performed.     ABNORMALITY - Intermittent slow, left and right temporal region  IMPRESSION: This study is suggestive of non specific cortical dysfunction arising from left and right temporal region. No seizures or epileptiform discharges were seen throughout the recording.  [employer] Annabelle Harman

## 2023-02-13 ENCOUNTER — Observation Stay (HOSPITAL_BASED_OUTPATIENT_CLINIC_OR_DEPARTMENT_OTHER): Payer: No Typology Code available for payment source

## 2023-02-13 ENCOUNTER — Other Ambulatory Visit (HOSPITAL_COMMUNITY): Payer: No Typology Code available for payment source

## 2023-02-13 DIAGNOSIS — R55 Syncope and collapse: Secondary | ICD-10-CM

## 2023-02-13 DIAGNOSIS — E1169 Type 2 diabetes mellitus with other specified complication: Secondary | ICD-10-CM | POA: Diagnosis not present

## 2023-02-13 DIAGNOSIS — Z951 Presence of aortocoronary bypass graft: Secondary | ICD-10-CM | POA: Diagnosis not present

## 2023-02-13 DIAGNOSIS — F0390 Unspecified dementia without behavioral disturbance: Secondary | ICD-10-CM | POA: Diagnosis not present

## 2023-02-13 LAB — ECHOCARDIOGRAM COMPLETE
AR max vel: 1.01 cm2
AV Area VTI: 1.13 cm2
AV Area mean vel: 1.15 cm2
AV Mean grad: 11 mmHg
AV Peak grad: 21.2 mmHg
Ao pk vel: 2.3 m/s
Area-P 1/2: 3.34 cm2
Height: 67 in
S' Lateral: 2.6 cm
Weight: 2716.07 oz

## 2023-02-13 LAB — GLUCOSE, CAPILLARY
Glucose-Capillary: 103 mg/dL — ABNORMAL HIGH (ref 70–99)
Glucose-Capillary: 162 mg/dL — ABNORMAL HIGH (ref 70–99)

## 2023-02-13 LAB — CBC
HCT: 39.3 % (ref 39.0–52.0)
Hemoglobin: 13.2 g/dL (ref 13.0–17.0)
MCH: 35.1 pg — ABNORMAL HIGH (ref 26.0–34.0)
MCHC: 33.6 g/dL (ref 30.0–36.0)
MCV: 104.5 fL — ABNORMAL HIGH (ref 80.0–100.0)
Platelets: 179 10*3/uL (ref 150–400)
RBC: 3.76 MIL/uL — ABNORMAL LOW (ref 4.22–5.81)
RDW: 14.3 % (ref 11.5–15.5)
WBC: 6.7 10*3/uL (ref 4.0–10.5)
nRBC: 0.3 % — ABNORMAL HIGH (ref 0.0–0.2)

## 2023-02-13 LAB — BASIC METABOLIC PANEL
Anion gap: 6 (ref 5–15)
BUN: 15 mg/dL (ref 8–23)
CO2: 25 mmol/L (ref 22–32)
Calcium: 8.8 mg/dL — ABNORMAL LOW (ref 8.9–10.3)
Chloride: 104 mmol/L (ref 98–111)
Creatinine, Ser: 0.99 mg/dL (ref 0.61–1.24)
GFR, Estimated: >60 mL/min (ref >60)
Glucose, Bld: 169 mg/dL — ABNORMAL HIGH (ref 70–99)
Potassium: 3.2 mmol/L — ABNORMAL LOW (ref 3.5–5.1)
Sodium: 135 mmol/L (ref 135–145)

## 2023-02-13 MED ORDER — PERFLUTREN LIPID MICROSPHERE
1.0000 mL | INTRAVENOUS | Status: AC | PRN
  Administered 2023-02-13: 4 mL via INTRAVENOUS

## 2023-02-13 MED ORDER — POTASSIUM CHLORIDE CRYS ER 20 MEQ PO TBCR
40.0000 meq | EXTENDED_RELEASE_TABLET | Freq: Once | ORAL | Status: AC
  Administered 2023-02-13: 40 meq via ORAL
  Filled 2023-02-13: qty 2

## 2023-02-13 NOTE — Discharge Summary (Signed)
,.Physician Discharge Summary  Derek [name]:[id number] DOB: January 30, 1946 DOA: 02/12/2023  PCP: [facility]  Admit date: 02/12/2023 Discharge date: 02/13/2023 Recommendations for Outpatient Follow-up:  Follow up with PCP in 1 weeks-call for appointment Follow-up with neurology as outpatient Please obtain BMP/CBC in one week Follow up with neurology in 1 wk  Discharge Dispo: Home Discharge Condition: Stable Code Status:   Code Status: Full Code Diet recommendation:  Diet Order             Diet heart healthy/carb modified Room service appropriate? Yes; Fluid consistency: Thin  Diet effective now                    Brief/Interim Summary: 77 y.o.f w/ significant of CAD,DM,HTN,PTSD,dementia, sarcoidosis, and OSA not using home CPAP presented with syncope- described as while in the shower, he seemed to be drifting backwards his wife pushed him forward and he leaned back again then she turned him around and seated him on the bench>he leaned back against the wall and his mouth looked twisted, chewing and shaking. LE not involved, but upper extremities only and looked like he would gag.  It lasted no longer than 3-5 minutes.He seemed to come to his senses some, no communication much so brought to ED In the ED heart rate in 50s BP stable, not hypoxic.  Labs with elevated creatinine 1.2 otherwise fairly stable findings negative troponin x 2. 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 Patient presentation concerning for presyncope/syncope, but no postictal so admission requested for further management.  Seen by PT OT orthostatic vitals negative, able to ambulate without any problem, alert awake oriented x 3 nonfocal on exam. Echo pending once resulted and if no acute finding patient can be discharged home with instruction for  follow-up with PCP and neurology as outpatient, plan of care discussed with the wife and patient and they are  in agreement    Discharge Diagnoses:  Principal Problem:   Syncope and collapse Active Problems:   Dementia without behavioral disturbance (HCC)   History of coronary artery bypass graft   OSA on CPAP   Controlled NIDDM-2 with hyperlipidemia   Gout   Benign essential hypertension   Rheumatoid arthritis (HCC)  Syncope/presyncope:Seizure versus vasovagal syncope.  CT head imaging labs with mild dehydration no other acute findings. EEG: Intermittent slow, left and right temporal region:suggestive of non specific cortical dysfunction arising from left and right temporal region. No seizures or epileptiform discharges were seen throughout the recording.Seen by PT OT orthostatic vitals negative, able to ambulate without any problem, alert awake oriented x 3 nonfocal on exam. Echo pending once resulted and if no acute finding patient can be discharged home with instruction for follow-up with PCP and neurology as outpatient, plan of care discussed with the wife and patient and they are  in agreement Echo came back ok, no new changes.  Hypokalemia:Repleted  Dementia mild PTSD history: On trazodone, continue delirium precaution fall precaution.  He is AAOx4.  Advised to follow-up with neurology as outpatient.  Prolonged Qtc: Avoid QT prolonging medication.  Electrolytes are stable. Repeat EKG> qtc 412 improved.  CAD with history of CABG HTN HLD: Troponin negative x 2, no chest pain.  BP is well-controlled.  Continue on Toprol, statin, home aspirin  Diabetes mellitus: Recent A1c of 5.6 on Glucophage and Jardiance holding for now> resume upon discharge  OSA; Does not use CPAP at home, refused here.  Gout: Continue allopurinol   RA: Hold methotrexate for now but likely ok to resume soon   Consults: none Subjective: Aaox4, no complaints doing well  Discharge  Exam: Vitals:   02/13/23 0727 02/13/23 1111  BP: (!) 154/68 114/63  Pulse: (!) 56 (!) 55  Resp: 17 16  Temp: 98.2 F (36.8 C) 98 F (36.7 C)  SpO2: 97%    General: Pt is alert, awake, not in acute distress Cardiovascular: RRR, S1/S2 +, no rubs, no gallops Respiratory: CTA bilaterally, no wheezing, no rhonchi Abdominal: Soft, NT, ND, bowel sounds + Extremities: no edema, no cyanosis  Discharge Instructions  Discharge Instructions     Discharge instructions   Complete by: As directed    Please call call MD or return to ER for similar or worsening recurring problem that brought you to hospital or if any fever,nausea/vomiting,abdominal pain, uncontrolled pain, chest pain,  shortness of breath or any other alarming symptoms.  Please follow-up your doctor as instructed in a week time and call the office for appointment.  Please avoid alcohol, smoking, or any other illicit substance and maintain healthy habits including taking your regular medications as prescribed.  You were cared for by a hospitalist during your hospital stay. If you have any questions about your discharge medications or the care you received while you were in the hospital after you are discharged, you can call the unit and ask to speak with the hospitalist on call if the hospitalist that took care of you is not available.  Once you are discharged, your primary care physician will handle any further medical issues. Please note that NO REFILLS for any discharge medications will be authorized once you are discharged, as it is imperative that you return to your primary care physician (or establish a relationship with a primary care physician if you do not have one) for your aftercare needs so that they can reassess your need for medications and monitor your lab values   Increase activity slowly   Complete by: As directed       Allergies as of 02/13/2023   No Known Allergies      Medication List     TAKE these  medications    allopurinol 300 MG tablet Commonly known as: ZYLOPRIM Take 300 mg by mouth daily.   aspirin EC 81 MG tablet Take 81 mg by mouth daily. Swallow whole.   cholecalciferol 25 MCG (1000 UNIT) tablet Commonly known as: VITAMIN D3 Take 1,000 Units by mouth daily.   empagliflozin 25 MG Tabs tablet Commonly known as: JARDIANCE Take 12.5 mg by mouth every morning.   feeding supplement Liqd Take 237 mLs by mouth 2 (two) times daily between meals.   FLUoxetine 20 MG capsule Commonly known as: PROZAC Take 20 mg by mouth daily.   furosemide 20 MG tablet Commonly known as: LASIX Take 0.5 tablets (10 mg total) by mouth daily as needed for fluid or edema. What changed:  how much to take when to take this   metFORMIN 500 MG 24 hr tablet Commonly known as: GLUCOPHAGE-XR Take 1,000 mg by mouth 2 (two) times daily with a meal.   methotrexate 2.5 MG tablet Commonly known as: RHEUMATREX Take 10 mg by mouth 2 (two) times a week. Friday & Saturday   metoprolol succinate 25 MG 24 hr tablet Commonly known as: TOPROL-XL Take 12.5 mg by mouth every morning.   rosuvastatin 20 MG tablet Commonly known  as: CRESTOR Take 20 mg by mouth at bedtime.   traZODone 50 MG tablet Commonly known as: DESYREL Take 50 mg by mouth at bedtime as needed for sleep.        Follow-up Information     [facility] Follow up in 1 week(s).   Contact information: [address] [facility] [place] [address] [phone]                No Known Allergies  The results of significant diagnostics from this hospitalization (including imaging, microbiology, ancillary and laboratory) are listed below for reference.    Microbiology: No results found for this or any previous visit (from the past 240 hour(s)).  Procedures/Studies: ECHOCARDIOGRAM COMPLETE  Result Date: 02/13/2023    ECHOCARDIOGRAM REPORT   Patient Name:   Derek [name] Date of Exam: 02/13/2023  Medical Rec #:  [id number]           Height:       67.0 in Accession #:    [id number]          Weight:       169.8 lb Date of Birth:  30-Sep-1946           BSA:          1.886 m Patient Age:    76 years            BP:           114/63 mmHg Patient Gender: M                   HR:           52 bpm. Exam Location:  Inpatient Procedure: 2D Echo, Color Doppler, Cardiac Doppler and Intracardiac            Opacification Agent Indications:    R55 Syncope  History:        Patient has prior history of Echocardiogram examinations, most                 recent 12/02/2017. Prior CABG; Risk Factors:Hypertension,                 Diabetes, Dyslipidemia and Sleep Apnea.  Sonographer:    Irving Burton Senior RDCS Referring Phys: Jonah Blue  Sonographer Comments: Definity used to assess for apical thrombus IMPRESSIONS  1. Left ventricular ejection fraction, by estimation, is 65 to 70%. The left ventricle has normal function. The left ventricle has no regional wall motion abnormalities. Left ventricular diastolic parameters are consistent with Grade I diastolic dysfunction (impaired relaxation).  2. Right ventricular systolic function is low normal. The right ventricular size is normal. Tricuspid regurgitation signal is inadequate for assessing PA pressure.  3. The mitral valve is degenerative. Trivial mitral valve regurgitation. Moderate mitral annular calcification.  4. The aortic valve is tricuspid. There is moderate calcification of the aortic valve. There is moderate thickening of the aortic valve. Aortic valve regurgitation is mild. Mild aortic valve stenosis. Aortic valve mean gradient measures 11.0 mmHg. Aortic valve Vmax measures 2.30 m/s.  5. The inferior vena cava is normal in size with greater than 50% respiratory variability, suggesting right atrial pressure of 3 mmHg.  6. PFO present at time of CABG by intraoperative TEE report. Evidence of atrial level shunting detected by color flow Doppler. Comparison(s): Compared to prior  echo report in 2019, there is no significant change. FINDINGS  Left Ventricle: Left ventricular ejection fraction, by estimation, is 65 to 70%. The left  ventricle has normal function. The left ventricle has no regional wall motion abnormalities. Definity contrast agent was given IV to delineate the left ventricular  endocardial borders. The left ventricular internal cavity size was normal in size. There is borderline left ventricular hypertrophy. Left ventricular diastolic parameters are consistent with Grade I diastolic dysfunction (impaired relaxation). Right Ventricle: The right ventricular size is normal. No increase in right ventricular wall thickness. Right ventricular systolic function is low normal. Tricuspid regurgitation signal is inadequate for assessing PA pressure. Left Atrium: Left atrial size was normal in size. Right Atrium: Right atrial size was normal in size. Pericardium: Trivial pericardial effusion is present. Presence of epicardial fat layer. Mitral Valve: The mitral valve is degenerative in appearance. There is moderate thickening of the mitral valve leaflet(s). There is moderate calcification of the mitral valve leaflet(s). Moderate mitral annular calcification. Trivial mitral valve regurgitation. Tricuspid Valve: The tricuspid valve is grossly normal. Tricuspid valve regurgitation is trivial. Aortic Valve: The aortic valve is tricuspid. There is moderate calcification of the aortic valve. There is moderate thickening of the aortic valve. Aortic valve regurgitation is mild. Mild aortic stenosis is present. Aortic valve mean gradient measures 11.0 mmHg. Aortic valve peak gradient measures 21.2 mmHg. Aortic valve area, by VTI measures 1.13 cm. Pulmonic Valve: The pulmonic valve was grossly normal. Pulmonic valve regurgitation is trivial. Aorta: The aortic root and ascending aorta are structurally normal, with no evidence of dilitation. Venous: The inferior vena cava is normal in size with  greater than 50% respiratory variability, suggesting right atrial pressure of 3 mmHg. IAS/Shunts: Evidence of atrial level shunting detected by color flow Doppler.  LEFT VENTRICLE PLAX 2D LVIDd:         4.10 cm   Diastology LVIDs:         2.60 cm   LV e' medial:    7.29 cm/s LV PW:         1.00 cm   LV E/e' medial:  10.1 LV IVS:        1.10 cm   LV e' lateral:   8.92 cm/s LVOT diam:     1.80 cm   LV E/e' lateral: 8.3 LV SV:         53 LV SV Index:   28 LVOT Area:     2.54 cm  RIGHT VENTRICLE RV S prime:     11.30 cm/s TAPSE (M-mode): 1.4 cm LEFT ATRIUM             Index        RIGHT ATRIUM           Index LA diam:        3.60 cm 1.91 cm/m   RA Area:     13.70 cm LA Vol (A2C):   32.5 ml 17.23 ml/m  RA Volume:   28.60 ml  15.16 ml/m LA Vol (A4C):   36.5 ml 19.35 ml/m LA Biplane Vol: 37.2 ml 19.72 ml/m  AORTIC VALVE AV Area (Vmax):    1.01 cm AV Area (Vmean):   1.15 cm AV Area (VTI):     1.13 cm AV Vmax:           230.00 cm/s AV Vmean:          140.000 cm/s AV VTI:            0.465 m AV Peak Grad:      21.2 mmHg AV Mean Grad:      11.0 mmHg LVOT Vmax:  91.40 cm/s LVOT Vmean:        63.000 cm/s LVOT VTI:          0.207 m LVOT/AV VTI ratio: 0.45  AORTA Ao Root diam: 3.00 cm Ao Asc diam:  3.50 cm MITRAL VALVE MV Area (PHT): 3.34 cm    SHUNTS MV Decel Time: 227 msec    Systemic VTI:  0.21 m MV E velocity: 73.90 cm/s  Systemic Diam: 1.80 cm MV A velocity: 95.70 cm/s MV E/A ratio:  0.77 Laurance Flatten MD Electronically signed by Laurance Flatten MD Signature Date/Time: 02/13/2023/2:19:34 PM    Final    EEG adult  Result Date: 02/12/2023 Charlsie Quest, MD     02/12/2023  6:54 PM Patient Name: Derek [name] MRN: [id number] Epilepsy Attending: Charlsie Quest Referring Physician/Provider: Jonah Blue, MD Date: 02/12/2023 Duration: 21.27 mins Patient history: 77yo M with syncope getting eeg to evaluate for seizure Level of alertness: Awake AEDs during EEG study: None Technical aspects: This EEG  study was done with scalp electrodes positioned according to the 10-20 International system of electrode placement. Electrical activity was reviewed with band pass filter of 1-70Hz , sensitivity of 7 uV/mm, display speed of 89mm/sec with a 60Hz  notched filter applied as appropriate. EEG data were recorded continuously and digitally stored.  Video monitoring was available and reviewed as appropriate. Description: The posterior dominant rhythm consists of 8 Hz activity of moderate voltage (25-35 uV) seen predominantly in posterior head regions, symmetric and reactive to eye opening and eye closing. EEG showed intermittent 2-3hz  Hz theta-delta slowing in left and right temporal region. Physiologic photic driving was not seen during photic stimulation.  Hyperventilation was not performed.   ABNORMALITY - Intermittent slow, left and right temporal region IMPRESSION: This study is suggestive of non specific cortical dysfunction arising from left and right temporal region. No seizures or epileptiform discharges were seen throughout the recording. Charlsie Quest   DG Chest Port 1 View  Result Date: 02/12/2023 CLINICAL DATA:  Syncope. EXAM: PORTABLE CHEST 1 VIEW COMPARISON:  Chest radiographs dated Feb 25, 2022 FINDINGS: The heart is enlarged with evidence of prior coronary artery bypass grafting. Low lung volumes with left basilar opacity suggesting atelectasis and/or small effusion. Thoracic spondylosis. IMPRESSION: Low lung volumes with left basilar opacity suggesting atelectasis and/or small effusion. Stable cardiomegaly. Electronically Signed   By: Larose Hires D.O.   On: 02/12/2023 11:45   CT Head Wo Contrast  Result Date: 02/12/2023 CLINICAL DATA:  77 year old male with altered mental status. EXAM: CT HEAD WITHOUT CONTRAST TECHNIQUE: Contiguous axial images were obtained from the base of the skull through the vertex without intravenous contrast. RADIATION DOSE REDUCTION: This exam was performed according to the  departmental dose-optimization program which includes automated exposure control, adjustment of the mA and/or kV according to patient size and/or use of iterative reconstruction technique. COMPARISON:  None Available. FINDINGS: Brain: No midline shift, ventriculomegaly, mass effect, evidence of mass lesion, intracranial hemorrhage or evidence of cortically based acute infarction. Confluent and widespread bilateral cerebral white matter hypodensity. Small area of chronic appearing cortical encephalomalacia right middle frontal gyrus (series 5, image 26). Deep gray matter nuclei, brainstem and cerebellum relatively spared. Vascular: Calcified atherosclerosis at the skull base. No suspicious intracranial vascular hyperdensity. Skull: No acute osseous abnormality identified. Sinuses/Orbits: Scattered mild paranasal sinus mucosal thickening or small mucous retention cysts. Tympanic cavities and mastoids are clear. Other: Calcified scalp vessel atherosclerosis. No acute orbit or scalp soft tissue finding. IMPRESSION: 1. No  acute intracranial abnormality identified. 2. 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 3. Advanced calcified atherosclerosis, including scalp vessel involvement frequently seen with End stage renal disease. Electronically Signed   By: Odessa Fleming M.D.   On: 02/12/2023 11:29    Labs: BNP (last 3 results) Recent Labs    02/20/22 1017  BNP 210.3*   Basic Metabolic Panel: Recent Labs  Lab 02/12/23 1111 02/13/23 0037  NA 140 135  K 4.0 3.2*  CL 104 104  CO2 25 25  GLUCOSE 112* 169*  BUN 14 15  CREATININE 1.26* 0.99  CALCIUM 9.1 8.8*   Liver Function Tests: Recent Labs  Lab 02/12/23 1111  AST 42*  ALT 29  ALKPHOS 62  BILITOT 0.4  PROT 7.4  ALBUMIN 3.3*   Recent Labs  Lab 02/12/23 1111 02/13/23 0037  WBC 7.5 6.7  HGB 14.5 13.2  HCT 43.8 39.3  MCV 106.3* 104.5*  PLT 185 179   Recent Labs  Lab  02/12/23 1111 02/12/23 1654 02/12/23 2135 02/13/23 0629 02/13/23 1108  GLUCAP 98 85 180* 103* 162*  No results for input(s): "VITAMINB12", "FOLATE", "FERRITIN", "TIBC", "IRON", "RETICCTPCT" in the last 72 hours. Urinalysis    Component Value Date/Time   COLORURINE YELLOW 02/20/2022 1446   APPEARANCEUR HAZY (A) 02/20/2022 1446   LABSPEC 1.027 02/20/2022 1446   PHURINE 5.0 02/20/2022 1446   GLUCOSEU >=500 (A) 02/20/2022 1446   HGBUR MODERATE (A) 02/20/2022 1446   BILIRUBINUR NEGATIVE 02/20/2022 1446   KETONESUR 20 (A) 02/20/2022 1446   PROTEINUR 100 (A) 02/20/2022 1446   UROBILINOGEN 1.0 06/06/2012 1037   NITRITE NEGATIVE 02/20/2022 1446   LEUKOCYTESUR NEGATIVE 02/20/2022 1446   Sepsis Labs Recent Labs  Lab 02/12/23 1111 02/13/23 0037  WBC 7.5 6.7   Microbiology No results found for this or any previous visit (from the past 240 hour(s)).  Time coordinating discharge: 25 minutes  SIGNED: Lanae Boast, MD  [facility] 02/13/2023, 3:45 PM  If 7PM-7AM, please contact night-coverage [email or web address]

## 2023-02-13 NOTE — Progress Notes (Signed)
Echocardiogram 2D Echocardiogram has been performed.  Warren Lacy Lester Platas RDCS 02/13/2023, 2:07 PM

## 2023-02-13 NOTE — Evaluation (Signed)
Occupational Therapy Evaluation Patient Details Name: Derek [name] MRN: [id number] DOB: Oct 14, 1945 Today's Date: 02/13/2023   History of Present Illness PT is a 77 y/o M presenting to ED on 5/4 with syncopal episode, CT head negative. PMH includes CAD, DM, HTN, PTSD, dementia, sarciodosis, OSA on CPAP   Clinical Impression   Pt reports needing assist at baseline from spouse for ADLs/IADLs, uses RW for mobility. Pt lives with spouse and has an aide 3 days/week x6 hrs a day. Pt currently A &Ox4, needing clarification from spouse for PLOF/home setup. Pt needing setup -mod A for ADLs, min guard for bed mobility, and min guard for transfers with RW. Pt denies symptoms and is mildly orthostatic during session (see below). Pt presenting with impairments listed below, will follow acutely. Recommend HHOT at d/c.  BP supine 126/64 (84) HR 56 BP seated 120/68 (85) HR 57 BP standing 110/76 (86) HR 74 BP standing after short distance ambulation 101/73 (82 ) HR 67 BP seated end of session 120/80 (90) HR 58      Recommendations for follow up therapy are one component of a multi-disciplinary discharge planning process, led by the attending physician.  Recommendations may be updated based on patient status, additional functional criteria and insurance authorization.   Assistance Recommended at Discharge Frequent or constant Supervision/Assistance  Patient can return home with the following A little help with walking and/or transfers;A lot of help with bathing/dressing/bathroom;Assistance with cooking/housework;Direct supervision/assist for medications management;Direct supervision/assist for financial management;Assist for transportation;Help with stairs or ramp for entrance    Functional Status Assessment  Patient has had a recent decline in their functional status and demonstrates the ability to make significant improvements in function in a reasonable and predictable amount of time.  Equipment  Recommendations  BSC/3in1    Recommendations for Other Services PT consult     Precautions / Restrictions Precautions Precautions: Fall Restrictions Weight Bearing Restrictions: No      Mobility Bed Mobility Overal bed mobility: Needs Assistance Bed Mobility: Supine to Sit     Supine to sit: Min guard          Transfers Overall transfer level: Needs assistance Equipment used: Rolling walker (2 wheels) Transfers: Sit to/from Stand Sit to Stand: Min guard                  Balance Overall balance assessment: Needs assistance Sitting-balance support: Feet supported Sitting balance-Leahy Scale: Poor Sitting balance - Comments: posterior lean with attempting to don socks in figure 4 Postural control: Posterior lean Standing balance support: During functional activity, Reliant on assistive device for balance Standing balance-Leahy Scale: Poor Standing balance comment: reliant on RW support                           ADL either performed or assessed with clinical judgement   ADL Overall ADL's : Needs assistance/impaired Eating/Feeding: Set up;Sitting   Grooming: Set up;Wash/dry face;Standing Grooming Details (indicate cue type and reason): standing at sink Upper Body Bathing: Min guard;Sitting   Lower Body Bathing: Moderate assistance;Sitting/lateral leans   Upper Body Dressing : Sitting;Minimal assistance   Lower Body Dressing: Moderate assistance;Sitting/lateral leans Lower Body Dressing Details (indicate cue type and reason): donning socks seated EOB Toilet Transfer: Min guard;Ambulation;Rolling walker (2 wheels);Regular Teacher, adult education Details (indicate cue type and reason): simulated via functional mobility Toileting- Clothing Manipulation and Hygiene: Minimal assistance       Functional mobility during ADLs: Min guard;Rolling  walker (2 wheels)       Vision   Vision Assessment?: No apparent visual deficits     Perception  Perception Perception Tested?: No   Praxis Praxis Praxis tested?: Not tested    Pertinent Vitals/Pain Pain Assessment Pain Assessment: No/denies pain     Hand Dominance Right   Extremity/Trunk Assessment Upper Extremity Assessment Upper Extremity Assessment: Generalized weakness   Lower Extremity Assessment Lower Extremity Assessment: Defer to PT evaluation   Cervical / Trunk Assessment Cervical / Trunk Assessment: Normal   Communication Communication Communication: No difficulties   Cognition Arousal/Alertness: Awake/alert Behavior During Therapy: WFL for tasks assessed/performed, Flat affect Overall Cognitive Status: No family/caregiver present to determine baseline cognitive functioning                                 General Comments: baseline dementia, A& O x4, follows commands appropriately, needed clarification from spouse for PLOF     General Comments  VSS on RA, spouse present, see note for orthostatic vitals    Exercises     Shoulder Instructions      Home Living Family/patient expects to be discharged to:: Private residence Living Arrangements: Spouse/significant other Available Help at Discharge: Family;Available 24 hours/day Type of Home: House Home Access: Stairs to enter [employer] of Steps: 3 Entrance Stairs-Rails: Left Home Layout: Two level;Bed/bath upstairs Alternate Level Stairs-Number of Steps: flight, stair lift   Bathroom Shower/Tub: Producer, television/film/video: Handicapped height Bathroom Accessibility: Yes   Home Equipment: Agricultural consultant (2 wheels);Shower seat   Additional Comments: has aide 3 days/week for ~6 hrs/day      Prior Functioning/Environment Prior Level of Function : Independent/Modified Independent             Mobility Comments: RW for mobility ADLs Comments: spouse assists with ADLs, spouse is present 24/7 and does not leave pt home alone        OT Problem List: Decreased  strength;Decreased range of motion;Decreased activity tolerance;Impaired balance (sitting and/or standing);Decreased safety awareness;Decreased cognition;Cardiopulmonary status limiting activity      OT Treatment/Interventions: Therapeutic exercise;Self-care/ADL training;Energy conservation;DME and/or AE instruction;Therapeutic activities;Patient/family education;Balance training;Cognitive remediation/compensation    OT Goals(Current goals can be found in the care plan section) Acute Rehab OT Goals Patient Stated Goal: none stated OT Goal Formulation: With patient Time For Goal Achievement: 02/27/23 Potential to Achieve Goals: Good ADL Goals Pt Will Perform Upper Body Dressing: with modified independence;sitting Pt Will Perform Lower Body Dressing: with supervision;sitting/lateral leans;sit to/from stand Pt Will Transfer to Toilet: with modified independence;ambulating;regular height toilet Pt Will Perform Tub/Shower Transfer: Shower transfer;with modified independence;ambulating;shower seat;rolling walker  OT Frequency: Min 1X/week    Co-evaluation              AM-PAC OT "6 Clicks" Daily Activity     Outcome Measure Help from another person eating meals?: A Little Help from another person taking care of personal grooming?: A Little Help from another person toileting, which includes using toliet, bedpan, or urinal?: A Little Help from another person bathing (including washing, rinsing, drying)?: A Lot Help from another person to put on and taking off regular upper body clothing?: A Little Help from another person to put on and taking off regular lower body clothing?: A Lot 6 Click Score: 16   End of Session Equipment Utilized During Treatment: Gait belt;Rolling walker (2 wheels) Nurse Communication: Mobility status  Activity Tolerance: Patient tolerated treatment  well Patient left: in bed;with call bell/phone within reach;with bed alarm set;with family/visitor present  OT  Visit Diagnosis: Unsteadiness on feet (R26.81);Other abnormalities of gait and mobility (R26.89);Muscle weakness (generalized) (M62.81)                Time: 6045-4098 OT Time Calculation (min): 26 min Charges:  OT General Charges $OT Visit: 1 Visit OT Evaluation $OT Eval Moderate Complexity: 1 Mod OT Treatments $Self Care/Home Management : 8-22 mins  Carver Fila, OTD, OTR/L SecureChat Preferred Acute Rehab [phone]  Kassi Esteve K Koonce 02/13/2023, 8:40 AM

## 2023-02-13 NOTE — Hospital Course (Addendum)
76 y.o.f w/ significant of CAD,DM,HTN,PTSD,dementia, sarcoidosis, and OSA not using home CPAP presented with syncope- described as while in the shower, he seemed to be drifting backwards his wife pushed him forward and he leaned back again then she turned him around and seated him on the bench>he leaned back against the wall and his mouth looked twisted, chewing and shaking. LE not involved, but upper extremities only and looked like he would gag.  It lasted no longer than 3-5 minutes.He seemed to come to his senses some, no communication much so brought to ED In the ED heart rate in 50s BP stable, not hypoxic.  Labs with elevated creatinine 1.2 otherwise fairly stable findings negative troponin x 2. 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 Patient presentation concerning for presyncope/syncope, but no postictal so admission requested for further management.  Seen by PT OT orthostatic vitals negative, able to ambulate without any problem, alert awake oriented x 3 nonfocal on exam. Echo pending once resulted and if no acute finding patient can be discharged home with instruction for follow-up with PCP and neurology as outpatient, plan of care discussed with the wife and patient and they are  in agreement

## 2023-02-13 NOTE — Evaluation (Signed)
Physical Therapy Evaluation & Discharge Patient Details Name: Derek [name] MRN: [id number] DOB: November 05, 1945 Today's Date: 02/13/2023  History of Present Illness  Pt is a 77 y/o male admitted 02/12/23 with syncopal episode, CT head negative. PMH includes CAD, DM, HTN, PTSD, dementia, sarciodosis, OSA on CPAP.   Clinical Impression  Patient evaluated by Physical Therapy with no further acute PT needs identified. PTA, pt ambulatory with RW, has 24/7 supervision from wife and weekly PCA assist. Today, pt moving well with RW at supervision-level; asymptomatic with mobility. All education has been completed and the patient has no further questions. Wife requesting follow up with HHPT services. Acute PT is signing off. Thank you for this referral.    HR 50s-60s Post-ambulation BP 123/74 (90)    Recommendations for follow up therapy are one component of a multi-disciplinary discharge planning process, led by the attending physician.  Recommendations may be updated based on patient status, additional functional criteria and insurance authorization.  Assistance Recommended at Discharge Frequent or constant Supervision/Assistance  Patient can return home with the following  A little help with bathing/dressing/bathroom;Assistance with cooking/housework;Assist for transportation;Direct supervision/assist for medications management;Direct supervision/assist for financial management;Help with stairs or ramp for entrance    Equipment Recommendations None recommended by PT  Recommendations for Other Services       Functional Status Assessment       Precautions / Restrictions Precautions Precautions: Fall Restrictions Weight Bearing Restrictions: No      Mobility  Bed Mobility Overal bed mobility: Modified Independent Bed Mobility: Supine to Sit, Sit to Supine                Transfers Overall transfer level: Needs assistance Equipment used: Rolling walker (2 wheels) Transfers: Sit  to/from Stand Sit to Stand: Supervision                Ambulation/Gait Ambulation/Gait assistance: Supervision Gait Distance (Feet): 160 Feet Assistive device: Rolling walker (2 wheels) Gait Pattern/deviations: Step-through pattern, Decreased stride length Gait velocity: Decreased     General Gait Details: slow, steady gait with RW and min guard for balance, progressing to supervision; pt denies feeling dizzy or lightheaded  Stairs            Wheelchair Mobility    Modified Rankin (Stroke Patients Only)       Balance Overall balance assessment: Needs assistance Sitting-balance support: Feet supported, No upper extremity supported Sitting balance-Leahy Scale: Fair     Standing balance support: No upper extremity supported, During functional activity, Reliant on assistive device for balance Standing balance-Leahy Scale: Fair Standing balance comment: can static stand without UE support; static and dynamic stability improved with RW                             Pertinent Vitals/Pain Pain Assessment Pain Assessment: No/denies pain    Home Living Family/patient expects to be discharged to:: Private residence Living Arrangements: Spouse/significant other Available Help at Discharge: Family;Personal care attendant;Available 24 hours/day Type of Home: House Home Access: Stairs to enter Entrance Stairs-Rails: Left Entrance Stairs-Number of Steps: 3 Alternate Level Stairs-Number of Steps: flight, stair lift Home Layout: Two level;Bed/bath upstairs Home Equipment: Rolling Walker (2 wheels);Shower seat;Other (comment) (walker with seat) Additional Comments: has aide 3 days/week for ~6 hrs/day    Prior Function Prior Level of Function : Needs assist  Cognitive Assist : ADLs (cognitive);Mobility (cognitive)           Mobility  Comments: mod indep ambulating with RW, supervision from wife and/or aide; primarily sedentary despite wife's encouragement to  walk more ADLs Comments: spouse assists with ADLs; spouse is present 24/7 and does not leave pt home alone     Hand Dominance   Dominant Hand: Right    Extremity/Trunk Assessment   Upper Extremity Assessment Upper Extremity Assessment: Overall WFL for tasks assessed    Lower Extremity Assessment Lower Extremity Assessment: Overall WFL for tasks assessed (BLE strength 4-5/5)    Cervical / Trunk Assessment Cervical / Trunk Assessment: Normal  Communication   Communication: No difficulties  Cognition Arousal/Alertness: Awake/alert Behavior During Therapy: WFL for tasks assessed/performed Overall Cognitive Status: History of cognitive impairments - at baseline                                 General Comments: h/o dementia. pleasant and following simple commands well        General Comments General comments (skin integrity, edema, etc.): spouse present and supportive. HR 50s-60s during session, post ambulation BP 123/74 (90). pt preparing for d/c home, spouse requesting HHPT services; no DME needs. educ pt on importance of more frequent mobility at home, including walking with wife daily    Exercises     Assessment/Plan    PT Assessment All further PT needs can be met in the next venue of care  PT Problem List Decreased activity tolerance;Decreased balance;Decreased mobility;Cardiopulmonary status limiting activity       PT Treatment Interventions      PT Goals (Current goals can be found in the Care Plan section)  Acute Rehab PT Goals PT Goal Formulation: All assessment and education complete, DC therapy    Frequency       Co-evaluation               AM-PAC PT "6 Clicks" Mobility  Outcome Measure Help needed turning from your back to your side while in a flat bed without using bedrails?: None Help needed moving from lying on your back to sitting on the side of a flat bed without using bedrails?: None Help needed moving to and from a bed to a  chair (including a wheelchair)?: A Little Help needed standing up from a chair using your arms (e.g., wheelchair or bedside chair)?: A Little Help needed to walk in hospital room?: A Little Help needed climbing 3-5 steps with a railing? : A Little 6 Click Score: 20    End of Session Equipment Utilized During Treatment: Gait belt Activity Tolerance: Patient tolerated treatment well Patient left: in bed;with call bell/phone within reach;with family/visitor present Nurse Communication: Mobility status PT Visit Diagnosis: Other abnormalities of gait and mobility (R26.89)    Time: 4098-1191 PT Time Calculation (min) (ACUTE ONLY): 18 min   Charges:   PT Evaluation $PT Eval Low Complexity: 1 Low         Ina Homes, PT, DPT Acute Rehabilitation Services  Personal: Secure Chat Rehab Office: [phone]  Malachy Chamber 02/13/2023, 4:03 PM

## 2023-02-13 NOTE — TOC Transition Note (Addendum)
Transition of Care [facility]) - CM/SW Discharge Note   Patient Details  Name: Derek [name] MRN: [id number] Date of Birth: March 13, 1946  Transition of Care [facility] CM/SW Contact:  Lawerance Sabal, RN Phone Number: 02/13/2023, 4:11 PM   Clinical Narrative:     Sherron Monday w patient at bedside.  He declines 3/1, would like HH services, MD to place orders.  Referral made to [facility] as patient had no preference.  [facility] No other CM needs identified  Final next level of care: Home w Home Health Services Barriers to Discharge: No Barriers Identified   Patient Goals and CMS Choice CMS Medicare.gov Compare Post Acute Care list provided to:: Patient Choice offered to / list presented to : Patient  Discharge Placement                         Discharge Plan and Services Additional resources added to the After Visit Summary for                            [facility] Arranged: PT, OT [facility] Agency: [facility] Date [facility] Agency Contacted: 02/13/23 Time HH Agency Contacted: 1610 Representative spoke with at [facility] Agency: Rivka Barbara  Social Determinants of Health (SDOH) Interventions SDOH Screenings   Food Insecurity: No Food Insecurity (02/12/2023)  Housing: Low Risk  (02/12/2023)  Transportation Needs: No Transportation Needs (02/12/2023)  Utilities: Not At Risk (02/12/2023)  Physical Activity: Inactive (03/07/2018)  Stress: No Stress Concern Present (03/07/2018)  Tobacco Use: Low Risk  (02/12/2023)     Readmission Risk Interventions     No data to display

## 2023-02-13 NOTE — Plan of Care (Signed)
  Problem: Education: Goal: Ability to describe self-care measures that may prevent or decrease complications (Diabetes Survival Skills Education) will improve Outcome: Progressing Goal: Individualized Educational Video(s) Outcome: Progressing   Problem: Coping: Goal: Ability to adjust to condition or change in health will improve Outcome: Progressing   Problem: Fluid Volume: Goal: Ability to maintain a balanced intake and output will improve Outcome: Progressing   Problem: Health Behavior/Discharge Planning: Goal: Ability to identify and utilize available resources and services will improve Outcome: Progressing Goal: Ability to manage health-related needs will improve Outcome: Progressing   Problem: Metabolic: Goal: Ability to maintain appropriate glucose levels will improve Outcome: Progressing   Problem: Nutritional: Goal: Maintenance of adequate nutrition will improve Outcome: Progressing Goal: Progress toward achieving an optimal weight will improve Outcome: Progressing   Problem: Skin Integrity: Goal: Risk for impaired skin integrity will decrease Outcome: Progressing   Problem: Tissue Perfusion: Goal: Adequacy of tissue perfusion will improve Outcome: Progressing   Problem: Education: Goal: Knowledge of condition and prescribed therapy will improve Outcome: Progressing   Problem: Cardiac: Goal: Will achieve and/or maintain adequate cardiac output Outcome: Progressing   Problem: Physical Regulation: Goal: Complications related to the disease process, condition or treatment will be avoided or minimized Outcome: Progressing   Problem: Education: Goal: Knowledge of General Education information will improve Description: Including pain rating scale, medication(s)/side effects and non-pharmacologic comfort measures Outcome: Progressing   Problem: Health Behavior/Discharge Planning: Goal: Ability to manage health-related needs will improve Outcome:  Progressing   Problem: Clinical Measurements: Goal: Ability to maintain clinical measurements within normal limits will improve Outcome: Progressing Goal: Will remain free from infection Outcome: Progressing Goal: Diagnostic test results will improve Outcome: Progressing Goal: Respiratory complications will improve Outcome: Progressing Goal: Cardiovascular complication will be avoided Outcome: Progressing   Problem: Activity: Goal: Risk for activity intolerance will decrease Outcome: Progressing   Problem: Nutrition: Goal: Adequate nutrition will be maintained Outcome: Progressing   Problem: Coping: Goal: Level of anxiety will decrease Outcome: Progressing   Problem: Elimination: Goal: Will not experience complications related to bowel motility Outcome: Progressing Goal: Will not experience complications related to urinary retention Outcome: Progressing   Problem: Pain Managment: Goal: General experience of comfort will improve Outcome: Progressing   Problem: Safety: Goal: Ability to remain free from injury will improve Outcome: Progressing   Problem: Skin Integrity: Goal: Risk for impaired skin integrity will decrease Outcome: Progressing   

## 2023-02-21 ENCOUNTER — Other Ambulatory Visit: Payer: Self-pay

## 2023-02-21 ENCOUNTER — Observation Stay (HOSPITAL_COMMUNITY)
Admission: EM | Admit: 2023-02-21 | Discharge: 2023-02-22 | Disposition: A | Discharge status: Home Health | Payer: No Typology Code available for payment source | Attending: Family Medicine | Admitting: Family Medicine

## 2023-02-21 ENCOUNTER — Encounter (HOSPITAL_COMMUNITY): Payer: Self-pay

## 2023-02-21 ENCOUNTER — Emergency Department (HOSPITAL_COMMUNITY): Payer: No Typology Code available for payment source

## 2023-02-21 DIAGNOSIS — F0150 Vascular dementia without behavioral disturbance: Secondary | ICD-10-CM | POA: Diagnosis not present

## 2023-02-21 DIAGNOSIS — R531 Weakness: Secondary | ICD-10-CM | POA: Diagnosis present

## 2023-02-21 DIAGNOSIS — I10 Essential (primary) hypertension: Secondary | ICD-10-CM | POA: Diagnosis present

## 2023-02-21 DIAGNOSIS — N179 Acute kidney failure, unspecified: Secondary | ICD-10-CM | POA: Diagnosis not present

## 2023-02-21 DIAGNOSIS — Z951 Presence of aortocoronary bypass graft: Secondary | ICD-10-CM | POA: Insufficient documentation

## 2023-02-21 DIAGNOSIS — I2510 Atherosclerotic heart disease of native coronary artery without angina pectoris: Secondary | ICD-10-CM | POA: Diagnosis present

## 2023-02-21 DIAGNOSIS — M069 Rheumatoid arthritis, unspecified: Secondary | ICD-10-CM | POA: Diagnosis present

## 2023-02-21 DIAGNOSIS — Z7984 Long term (current) use of oral hypoglycemic drugs: Secondary | ICD-10-CM | POA: Diagnosis not present

## 2023-02-21 DIAGNOSIS — Z7982 Long term (current) use of aspirin: Secondary | ICD-10-CM | POA: Insufficient documentation

## 2023-02-21 DIAGNOSIS — M109 Gout, unspecified: Secondary | ICD-10-CM | POA: Diagnosis present

## 2023-02-21 DIAGNOSIS — R112 Nausea with vomiting, unspecified: Secondary | ICD-10-CM | POA: Diagnosis present

## 2023-02-21 DIAGNOSIS — E119 Type 2 diabetes mellitus without complications: Secondary | ICD-10-CM | POA: Diagnosis not present

## 2023-02-21 DIAGNOSIS — Z79899 Other long term (current) drug therapy: Secondary | ICD-10-CM | POA: Insufficient documentation

## 2023-02-21 DIAGNOSIS — Z96641 Presence of right artificial hip joint: Secondary | ICD-10-CM | POA: Diagnosis not present

## 2023-02-21 DIAGNOSIS — F0390 Unspecified dementia without behavioral disturbance: Secondary | ICD-10-CM | POA: Diagnosis present

## 2023-02-21 LAB — URINALYSIS, ROUTINE W REFLEX MICROSCOPIC
Bilirubin Urine: NEGATIVE
Glucose, UA: NEGATIVE mg/dL
Ketones, ur: NEGATIVE mg/dL
Leukocytes,Ua: NEGATIVE
Nitrite: NEGATIVE
Protein, ur: 100 mg/dL — AB
Specific Gravity, Urine: 1.025 (ref 1.005–1.030)
pH: 5 (ref 5.0–8.0)

## 2023-02-21 LAB — BASIC METABOLIC PANEL
Anion gap: 12 (ref 5–15)
BUN: 18 mg/dL (ref 8–23)
CO2: 21 mmol/L — ABNORMAL LOW (ref 22–32)
Calcium: 9.2 mg/dL (ref 8.9–10.3)
Chloride: 98 mmol/L (ref 98–111)
Creatinine, Ser: 1.64 mg/dL — ABNORMAL HIGH (ref 0.61–1.24)
GFR, Estimated: 44 mL/min — ABNORMAL LOW (ref >60)
Glucose, Bld: 162 mg/dL — ABNORMAL HIGH (ref 70–99)
Potassium: 3.9 mmol/L (ref 3.5–5.1)
Sodium: 131 mmol/L — ABNORMAL LOW (ref 135–145)

## 2023-02-21 LAB — CBG MONITORING, ED: Glucose-Capillary: 152 mg/dL — ABNORMAL HIGH (ref 70–99)

## 2023-02-21 LAB — CBC
HCT: 43.5 % (ref 39.0–52.0)
Hemoglobin: 14.7 g/dL (ref 13.0–17.0)
MCH: 34.9 pg — ABNORMAL HIGH (ref 26.0–34.0)
MCHC: 33.8 g/dL (ref 30.0–36.0)
MCV: 103.3 fL — ABNORMAL HIGH (ref 80.0–100.0)
Platelets: 292 10*3/uL (ref 150–400)
RBC: 4.21 MIL/uL — ABNORMAL LOW (ref 4.22–5.81)
RDW: 14.1 % (ref 11.5–15.5)
WBC: 8.9 10*3/uL (ref 4.0–10.5)
nRBC: 0 % (ref 0.0–0.2)

## 2023-02-21 LAB — HEPATIC FUNCTION PANEL
ALT: 30 U/L (ref 0–44)
AST: 46 U/L — ABNORMAL HIGH (ref 15–41)
Albumin: 3.5 g/dL (ref 3.5–5.0)
Alkaline Phosphatase: 66 U/L (ref 38–126)
Bilirubin, Direct: 0.2 mg/dL (ref 0.0–0.2)
Indirect Bilirubin: 0.7 mg/dL (ref 0.3–0.9)
Total Bilirubin: 0.9 mg/dL (ref 0.3–1.2)
Total Protein: 8.1 g/dL (ref 6.5–8.1)

## 2023-02-21 MED ORDER — LACTATED RINGERS IV SOLN: INTRAVENOUS | Status: DC

## 2023-02-21 MED ORDER — LACTATED RINGERS IV BOLUS
1000.0000 mL | Freq: Once | INTRAVENOUS | Status: AC
  Administered 2023-02-21: 1000 mL via INTRAVENOUS

## 2023-02-21 NOTE — H&P (Signed)
History and Physical    Derek [name] BMW:[id number] DOB: 1945-12-06 DOA: 02/21/2023  PCP: [facility]  Patient coming from: Home  I have personally briefly reviewed patient's old medical records in [facility]  Chief Complaint: Generalized weakness  HPI: Derek [name] is a 77 y.o. male with medical history significant for CAD s/p CABG 2019, T2DM, HTN, HLD, RA, gout, history of GI bleed due to gastric ulcer, PTSD, mild vascular dementia who presented to the ED for evaluation of generalized weakness.  History is supplemented by spouse at bedside.  Patient recently admitted 02/12/2023-02/13/2023 for syncope evaluation.  Workup included CT head, EEG, echo were reassuring.  Patient was able to ambulate with PT/OT without any difficulty.  He was alert and oriented x 4 with nonfocal exam at time of discharge.  Since discharge he has had poor oral intake with low appetite.  Today while patient was sitting on the couch his wife noticed that he started leaning over to the side and just appeared generally weak.  He has had a functional decline since he has been home.  She says she checked his temperature at home and it was 100.1 F.  He did not have any abdominal pain, nausea, or vomiting prior to arrival however at time of admitting evaluation he has now started to throw up.  Emesis is nonbloody.  He has generalized abdominal pain as well.  His wife states that he has had some intermittent loose stools recently.  Patient otherwise denies chest pain, dyspnea, dysuria.  ED Course  Labs/Imaging on admission: I have personally reviewed following labs and imaging studies.  Initial vitals showed BP 164/89, pulse 78, RR 18, temp 98.3 F, SpO2 96% on room air.  Labs show WBC 8.9, hemoglobin 14.7, platelets 292,000, sodium 131, potassium 3.9, bicarb 21, BUN 18, creatinine 1.64 (0.99 on 02/13/2023), serum glucose 162, urinalysis negative for UTI.  2 view chest x-ray shows mildly  decreased lung volumes with mild bibasilar subsegmental atelectasis.  Patient was given 1 L LR.  The hospitalist service was consulted to admit for further evaluation and management.  Review of Systems: All systems reviewed and are negative except as documented in history of present illness above.   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   H/O: upper GI bleed 01/2001    related to nonsteroidal use, CLO test +(Med Tx- Prilosec, Amoxicillin, & Biaxin)   Hypertension    Insomnia    Mental disorder    PTSD   Peptic ulcer disease    history of gastric ulcer   [facility]   Sarcoidosis    late 1970s   Scleritis    Sleep apnea    cpap with O2 4L for 4 yrs   Tubular adenoma 2004    Past Surgical History:  Procedure Laterality Date   CARDIAC CATHETERIZATION     CORONARY ARTERY BYPASS GRAFT  12/02/2017   at [facility] Dr Ty Hilts Surgeon   EYE SURGERY  05   growth lft   HIP PINNING,CANNULATED Left 02/12/2022   Procedure: CANNULATED HIP PINNING;  Surgeon: Terance Hart, MD;  Location: [facility];  Service: Orthopedics;  Laterality: Left;   TOTAL HIP ARTHROPLASTY  06/13/2012   TOTAL HIP ARTHROPLASTY  06/13/2012   Procedure: TOTAL HIP ARTHROPLASTY  ANTERIOR APPROACH;  Surgeon: Velna Ochs, MD;  Location: [facility] OR;  Service: Orthopedics;  Laterality: Right;  right anterior approach total hip arthroplasty    Social History:  reports that he has never smoked. He has never used smokeless tobacco. He reports current alcohol use. He reports that he does not use drugs.  No Known Allergies  Family History  Problem Relation Age of Onset   Stroke Mother    Hypertension Mother    Hypertension Father    Hypertension Brother    Gout Brother    Deep vein thrombosis Brother      Prior to Admission medications   Medication Sig Start Date End  Date Taking? Authorizing Provider  allopurinol (ZYLOPRIM) 300 MG tablet Take 300 mg by mouth daily.   Yes [provider]  aspirin EC 81 MG tablet Take 81 mg by mouth daily. Swallow whole.   Yes [provider]  cholecalciferol (VITAMIN D3) 25 MCG (1000 UNIT) tablet Take 1,000 Units by mouth daily.   Yes [provider]  feeding supplement (BOOST HIGH PROTEIN) LIQD Take 1 Container by mouth daily.   Yes [provider]  FLUoxetine (PROZAC) 20 MG capsule Take 20 mg by mouth daily.   Yes [provider]  furosemide (LASIX) 20 MG tablet Take 0.5 tablets (10 mg total) by mouth daily as needed for fluid or edema. Patient taking differently: Take 20 mg by mouth daily. 03/05/22  Yes Leatha Gilding, MD  metFORMIN (GLUCOPHAGE-XR) 500 MG 24 hr tablet Take 1,000 mg by mouth daily with breakfast. 08/03/21  Yes [provider]  methotrexate (RHEUMATREX) 2.5 MG tablet Take 10 mg by mouth 2 (two) times a week. Friday & Saturday   Yes [provider]  metoprolol succinate (TOPROL-XL) 25 MG 24 hr tablet Take 12.5 mg by mouth every morning. 02/08/20  Yes [provider]  rosuvastatin (CRESTOR) 20 MG tablet Take 20 mg by mouth daily. 12/23/21  Yes [provider]    Physical Exam: Vitals:   02/21/23 1822 02/21/23 1826 02/21/23 2150  BP: (!) 140/86  (!) 164/89  Pulse: 80  78  Resp: 16  18  Temp: 99.7 F (37.6 C)  98.3 F (36.8 C)  TempSrc: Oral    SpO2: 97%  96%  Weight:  76.7 kg   Height:  5\' 7"  (1.702 m)    Constitutional: Resting in bed, actively vomiting Eyes: EOMI, lids and conjunctivae normal ENMT: Mucous membranes are moist. Posterior pharynx clear of any exudate or lesions.Normal dentition.  Neck: normal, supple, no masses. Respiratory: clear to auscultation bilaterally, no wheezing, no crackles. Normal respiratory effort. No accessory muscle use.  Cardiovascular: Regular rate and rhythm, systolic murmur present. No  extremity edema. 2+ pedal pulses. Abdomen: Generalized tenderness to palpation, no masses palpated.  Musculoskeletal: no clubbing / cyanosis. No joint deformity upper and lower extremities. Good ROM, no contractures. Normal muscle tone.  Skin: no rashes, lesions, ulcers. No induration Neurologic: Sensation intact. Strength 5/5 in all 4.  Psychiatric: Alert and oriented to person, place, situation, but not year.  EKG: Personally reviewed. Sinus rhythm, rate 76, no acute ischemic changes.  Assessment/Plan Principal Problem:   AKI (acute kidney injury) (HCC) Active Problems:   Dementia without behavioral disturbance (HCC)   Nausea and vomiting   Generalized weakness   Gout   CAD s/p CABG in 2019 at [facility]   Benign essential hypertension   Rheumatoid arthritis (HCC)   Derek [name] is a 77  y.o. male with medical history significant for CAD s/p CABG 2019, T2DM, HTN, HLD, RA, gout, history of GI bleed due to gastric ulcer, PTSD, mild vascular dementia who is admitted with acute kidney injury.  Assessment and Plan: Acute kidney injury: Likely due to hypovolemia from poor oral intake.  Creatinine 1.64 compared to baseline 0.9-1.0. -Continue IV fluid hydration -Avoid NSAIDs, hold Lasix -Repeat labs in a.m.  Nausea and vomiting: New symptoms while in the ED.  Has generalized abdominal pain to palpation.  Chronic mild elevation in AST noted otherwise LFTs within normal limits. -Keep n.p.o. for now -Obtain KUB -Check lipase -IV antiemetics as needed  Generalized weakness: No focal deficit.  Likely deconditioning in setting of poor oral intake.  PT/OT eval.  Vascular dementia: Appears to be mild without behavioral disturbance.  Continue Prozac for mood.  CAD s/p CABG: Stable, denies chest pain.  Continue aspirin and statin.  Hypertension: Continue Toprol-XL.  Type 2 diabetes: Hold metformin.  Hyperlipidemia: Continue rosuvastatin.  Rheumatoid arthritis: Hold  methotrexate for now.  Gout: Continue allopurinol.  OSA: Not currently using CPAP.  Spouse states that he had a sleep study last month through the [place] but has not heard results yet.   DVT prophylaxis: enoxaparin (LOVENOX) injection 40 mg Start: 02/22/23 2200 Code Status: Full code, confirmed with patient on admission Family Communication: Spouse at bedside Disposition Plan: From home, dispo pending clinical progress Consults called: None Severity of Illness: The appropriate patient status for this patient is OBSERVATION. Observation status is judged to be reasonable and necessary in order to provide the required intensity of service to ensure the patient's safety. The patient's presenting symptoms, physical exam findings, and initial radiographic and laboratory data in the context of their medical condition is felt to place them at decreased risk for further clinical deterioration. Furthermore, it is anticipated that the patient will be medically stable for discharge from the hospital within 2 midnights of admission.   Darreld Mclean MD [facility]  If 7PM-7AM, please contact night-coverage [email or web address]  02/22/2023, 12:29 AM

## 2023-02-21 NOTE — ED Provider Notes (Signed)
EMERGENCY DEPARTMENT AT [facility] Provider Note  CSN: [id number] Arrival date & time: 02/21/23 1811  Chief Complaint(s) Weakness  HPI Derek [name] is a 77 y.o. male with PMH CAD, T2DM, HTN, PTSD, dementia, sarcoidosis, OSA recent hospital discharge on 02/13/2023 after a reassuring syncope workup who presents emergency room for evaluation of generalized weakness and fatigue.  History obtained from patient's wife who states that since hospital discharge, his functional status has started to decline and he is not eating or drinking.  She states that physical therapy did evaluate him in the hospital and that his overall strength has declined significantly since that evaluation.  She is worried she is unable to care for him at home.  Here in the emergency department, patient denies chest pain, shortness of breath, abdominal pain, nausea, vomiting or other systemic symptoms.   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   H/O: upper GI bleed 01/2001    related to nonsteroidal use, CLO test +(Med Tx- Prilosec, Amoxicillin, & Biaxin)   Hypertension    Insomnia    Mental disorder    PTSD   Peptic ulcer disease    history of gastric ulcer   [facility] ([facility]   Sarcoidosis    late 1970s   Scleritis    Sleep apnea    cpap with O2 4L for 4 yrs   Tubular adenoma 2004   Patient Active Problem List   Diagnosis Date Noted   Syncope and collapse 02/12/2023   Hypokalemia 02/22/2022   Hypophosphatemia 02/22/2022   Elevated liver enzymes 02/22/2022   Dysphagia and odynophagia 02/21/2022   Decreased oral intake 02/21/2022   Goals of care, counseling/discussion 02/21/2022   Aspiration pneumonia (HCC) 02/20/2022   Hypokalemia and hypophosphatemia 02/20/2022   Hyperbilirubinemia 02/20/2022   Severe sepsis due  to aspiration pneumonia 02/20/2022   Acute respiratory failure with hypoxia (HCC) 02/20/2022   Malnutrition of moderate degree 02/17/2022   Closed left hip fracture (HCC) 02/11/2022   Depression 02/11/2022   History of TIA (transient ischemic attack) 02/11/2022   History of coronary artery bypass graft 02/11/2022   Controlled NIDDM-2 with hyperlipidemia 02/11/2022   Gout 02/11/2022   Primary open-angle glaucoma, bilateral, indeterminate stage 02/11/2022   CAD s/p CABG in 2019 at [facility] 02/11/2022   Dementia without behavioral disturbance (HCC) 02/11/2022   Post-traumatic stress disorder, chronic 02/11/2022   OSA on CPAP 05/01/2021   Benign essential hypertension 05/01/2021   Rheumatoid arthritis (HCC) 05/01/2021   DJD (degenerative joint disease) of hip 06/13/2012    Class: Chronic   Home Medication(s) Prior to Admission medications   Medication Sig Start Date End Date Taking? Authorizing Provider  allopurinol (ZYLOPRIM) 300 MG tablet Take 300 mg by mouth daily.   Yes [provider]  aspirin EC 81 MG tablet Take 81 mg by mouth daily. Swallow whole.   Yes [provider]  cholecalciferol (VITAMIN D3) 25 MCG (1000 UNIT) tablet Take 1,000 Units by mouth daily.   Yes [provider]  feeding supplement (BOOST HIGH PROTEIN) LIQD Take 1 Container by mouth daily.   Yes [provider]  FLUoxetine (PROZAC) 20 MG capsule Take 20 mg by mouth daily.   Yes [provider]  furosemide (LASIX) 20 MG tablet Take  0.5 tablets (10 mg total) by mouth daily as needed for fluid or edema. Patient taking differently: Take 20 mg by mouth daily. 03/05/22  Yes Leatha Gilding, MD  metFORMIN (GLUCOPHAGE-XR) 500 MG 24 hr tablet Take 1,000 mg by mouth daily with breakfast. 08/03/21  Yes [provider]  methotrexate (RHEUMATREX) 2.5 MG tablet Take 10 mg by mouth 2 (two) times a week. Friday & Saturday   Yes [provider]  metoprolol succinate  (TOPROL-XL) 25 MG 24 hr tablet Take 12.5 mg by mouth every morning. 02/08/20  Yes [provider]  rosuvastatin (CRESTOR) 20 MG tablet Take 20 mg by mouth daily. 12/23/21  Yes [provider]                                                                                                                                    Past Surgical History Past Surgical History:  Procedure Laterality Date   CARDIAC CATHETERIZATION     CORONARY ARTERY BYPASS GRAFT  12/02/2017   at [facility] Dr Ty Hilts Surgeon   EYE SURGERY  05   growth lft   HIP PINNING,CANNULATED Left 02/12/2022   Procedure: CANNULATED HIP PINNING;  Surgeon: Terance Hart, MD;  Location: [facility];  Service: Orthopedics;  Laterality: Left;   TOTAL HIP ARTHROPLASTY  06/13/2012   TOTAL HIP ARTHROPLASTY  06/13/2012   Procedure: TOTAL HIP ARTHROPLASTY ANTERIOR APPROACH;  Surgeon: Velna Ochs, MD;  Location: [facility] OR;  Service: Orthopedics;  Laterality: Right;  right anterior approach total hip arthroplasty   Family History Family History  Problem Relation Age of Onset   Stroke Mother    Hypertension Mother    Hypertension Father    Hypertension Brother    Gout Brother    Deep vein thrombosis Brother     Social History Social History   Tobacco Use   Smoking status: Never   Smokeless tobacco: Never   Tobacco comments:    occ alcohol  Vaping Use   Vaping Use: Never used  Substance Use Topics   Alcohol use: Yes    Comment: occasional   Drug use: No   Allergies Patient has no known allergies.  Review of Systems Review of Systems  Constitutional:  Positive for fatigue.    Physical Exam Vital Signs  I have reviewed the triage vital signs BP (!) 164/89 (BP Location: Right Arm)   Pulse 78   Temp 98.3 F (36.8 C)   Resp 18   Ht 5\' 7"  (1.702 m)   Wt 76.7 kg   SpO2 96%   BMI 26.47 kg/m   Physical Exam Constitutional:      General: He is not in acute distress.    Appearance: Normal appearance.   HENT:     Head: Normocephalic and atraumatic.     Nose: No congestion or rhinorrhea.  Eyes:     General:  Right eye: No discharge.        Left eye: No discharge.     Extraocular Movements: Extraocular movements intact.     Pupils: Pupils are equal, round, and reactive to light.  Cardiovascular:     Rate and Rhythm: Normal rate and regular rhythm.     Heart sounds: No murmur heard. Pulmonary:     Effort: No respiratory distress.     Breath sounds: No wheezing or rales.  Abdominal:     General: There is no distension.     Tenderness: There is no abdominal tenderness.  Musculoskeletal:        General: Normal range of motion.     Cervical back: Normal range of motion.  Skin:    General: Skin is warm and dry.  Neurological:     General: No focal deficit present.     Mental Status: He is alert.     ED Results and Treatments Labs (all labs ordered are listed, but only abnormal results are displayed) Labs Reviewed  BASIC METABOLIC PANEL - Abnormal; Notable for the following components:      Result Value   Sodium 131 (*)    CO2 21 (*)    Glucose, Bld 162 (*)    Creatinine, Ser 1.64 (*)    GFR, Estimated 44 (*)    All other components within normal limits  CBC - Abnormal; Notable for the following components:   RBC 4.21 (*)    MCV 103.3 (*)    MCH 34.9 (*)    All other components within normal limits  URINALYSIS, ROUTINE W REFLEX MICROSCOPIC - Abnormal; Notable for the following components:   APPearance HAZY (*)    Hgb urine dipstick SMALL (*)    Protein, ur 100 (*)    Bacteria, UA RARE (*)    Crystals PRESENT (*)    All other components within normal limits  CBG MONITORING, ED - Abnormal; Notable for the following components:   Glucose-Capillary 152 (*)    All other components within normal limits  TSH  HEPATIC FUNCTION PANEL                                                                                                                          Radiology No  results found.  Pertinent labs & imaging results that were available during my care of the patient were reviewed by me and considered in my medical decision making (see MDM for details).  Medications Ordered in ED Medications  lactated ringers bolus 1,000 mL (has no administration in time range)  lactated ringers infusion (has no administration in time range)  Procedures Procedures  (including critical care time)  Medical Decision Making / ED Course   This patient presents to the ED for concern of fatigue, this involves an extensive number of treatment options, and is a complaint that carries with it a high risk of complications and morbidity.  The differential diagnosis includes deconditioning, dehydration, intra-abdominal infection, progression of underlying dementia, pneumonia, UTI, electrolyte abnormality  MDM: Patient emergency room for evaluation of generalized fatigue and weakness.  Physical exam is largely unremarkable.  Laboratory evaluation with a mild hyponatremia 131, creatinine 1.64 which is an elevation from hospital discharge (0.99).  Urinalysis without evidence of infection.  Fluid resuscitation begun with lactated Ringer's.  As the patient is decreasing from a functional status and is showing evidence of acute kidney injury likely secondary to dehydration, patient require hospital admission for rehydration.  Patient then admitted.   Additional history obtained: -Additional history obtained from wife -External records from outside source obtained and reviewed including: Chart review including previous notes, labs, imaging, consultation notes   Lab Tests: -I ordered, reviewed, and interpreted labs.   The pertinent results include:   Labs Reviewed  BASIC METABOLIC PANEL - Abnormal; Notable for the following components:      Result Value    Sodium 131 (*)    CO2 21 (*)    Glucose, Bld 162 (*)    Creatinine, Ser 1.64 (*)    GFR, Estimated 44 (*)    All other components within normal limits  CBC - Abnormal; Notable for the following components:   RBC 4.21 (*)    MCV 103.3 (*)    MCH 34.9 (*)    All other components within normal limits  URINALYSIS, ROUTINE W REFLEX MICROSCOPIC - Abnormal; Notable for the following components:   APPearance HAZY (*)    Hgb urine dipstick SMALL (*)    Protein, ur 100 (*)    Bacteria, UA RARE (*)    Crystals PRESENT (*)    All other components within normal limits  CBG MONITORING, ED - Abnormal; Notable for the following components:   Glucose-Capillary 152 (*)    All other components within normal limits  TSH  HEPATIC FUNCTION PANEL      EKG   EKG Interpretation  Date/Time:  Monday Feb 21 2023 18:20:12 EDT Ventricular Rate:  76 PR Interval:  178 QRS Duration: 76 QT Interval:  430 QTC Calculation: 483 R Axis:   -56 Text Interpretation: Normal sinus rhythm T wave abnormality, consider inferolateral ischemia Abnormal ECG When compared with ECG of 13-Feb-2023 05:27, PREVIOUS ECG IS PRESENT Confirmed by Margene Cherian ([id number]) on 02/21/2023 10:09:32 PM          Medicines ordered and prescription drug management: Meds ordered this encounter  Medications   lactated ringers bolus 1,000 mL   lactated ringers infusion    -I have reviewed the patients home medicines and have made adjustments as needed  Critical interventions none   Cardiac Monitoring: The patient was maintained on a cardiac monitor.  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   H/O: upper GI bleed 01/2001    related to nonsteroidal use, CLO test +(Med Tx- Prilosec, Amoxicillin, & Biaxin)   Hypertension    Insomnia    Mental disorder    PTSD   Peptic ulcer disease    history of gastric ulcer   [facility]   Sarcoidosis    late 1970s   Scleritis    Sleep apnea    cpap with O2 4L for 4 yrs   Tubular adenoma 2004      Dispostion: I considered admission for this patient, and due to decreased functional status and dehydration patient require hospital admission     Final Clinical Impression(s) / ED Diagnoses Final diagnoses:  Weakness     @PCDICTATION @    Glendora Score, MD 02/21/23 2325

## 2023-02-21 NOTE — ED Notes (Signed)
Patient to X-ray

## 2023-02-21 NOTE — ED Triage Notes (Signed)
Wife reports he was just discharged and has not been right since leaving the hospital.  Reports he had another seizure like episode today.  Reports had a low grade fever  Reports no appetitie.  Reports he has not been himself since being discharged.

## 2023-02-22 ENCOUNTER — Observation Stay (HOSPITAL_COMMUNITY): Payer: No Typology Code available for payment source

## 2023-02-22 DIAGNOSIS — R112 Nausea with vomiting, unspecified: Secondary | ICD-10-CM | POA: Diagnosis present

## 2023-02-22 DIAGNOSIS — N179 Acute kidney failure, unspecified: Secondary | ICD-10-CM | POA: Diagnosis present

## 2023-02-22 DIAGNOSIS — R531 Weakness: Secondary | ICD-10-CM

## 2023-02-22 LAB — BASIC METABOLIC PANEL
Anion gap: 10 (ref 5–15)
BUN: 17 mg/dL (ref 8–23)
CO2: 21 mmol/L — ABNORMAL LOW (ref 22–32)
Calcium: 9.1 mg/dL (ref 8.9–10.3)
Chloride: 101 mmol/L (ref 98–111)
Creatinine, Ser: 1.49 mg/dL — ABNORMAL HIGH (ref 0.61–1.24)
GFR, Estimated: 49 mL/min — ABNORMAL LOW (ref >60)
Glucose, Bld: 155 mg/dL — ABNORMAL HIGH (ref 70–99)
Potassium: 4.1 mmol/L (ref 3.5–5.1)
Sodium: 132 mmol/L — ABNORMAL LOW (ref 135–145)

## 2023-02-22 LAB — CBC
HCT: 41.7 % (ref 39.0–52.0)
Hemoglobin: 14.2 g/dL (ref 13.0–17.0)
MCH: 34.6 pg — ABNORMAL HIGH (ref 26.0–34.0)
MCHC: 34.1 g/dL (ref 30.0–36.0)
MCV: 101.7 fL — ABNORMAL HIGH (ref 80.0–100.0)
Platelets: 247 10*3/uL (ref 150–400)
RBC: 4.1 MIL/uL — ABNORMAL LOW (ref 4.22–5.81)
RDW: 14 % (ref 11.5–15.5)
WBC: 9.9 10*3/uL (ref 4.0–10.5)
nRBC: 0 % (ref 0.0–0.2)

## 2023-02-22 LAB — TSH: TSH: 1.619 u[IU]/mL (ref 0.350–4.500)

## 2023-02-22 LAB — LIPASE, BLOOD: Lipase: 32 U/L (ref 11–51)

## 2023-02-22 MED ORDER — ALLOPURINOL 300 MG PO TABS
300.0000 mg | ORAL_TABLET | Freq: Every day | ORAL | Status: DC
  Administered 2023-02-22: 300 mg via ORAL
  Filled 2023-02-22: qty 1

## 2023-02-22 MED ORDER — FLUOXETINE HCL 20 MG PO CAPS
20.0000 mg | ORAL_CAPSULE | Freq: Every day | ORAL | Status: DC
  Administered 2023-02-22: 20 mg via ORAL
  Filled 2023-02-22: qty 1

## 2023-02-22 MED ORDER — ACETAMINOPHEN 650 MG RE SUPP: 650.0000 mg | Freq: Four times a day (QID) | RECTAL | Status: DC | PRN

## 2023-02-22 MED ORDER — LACTATED RINGERS IV SOLN: INTRAVENOUS | Status: AC

## 2023-02-22 MED ORDER — ROSUVASTATIN CALCIUM 20 MG PO TABS
20.0000 mg | ORAL_TABLET | Freq: Every day | ORAL | Status: DC
  Administered 2023-02-22: 20 mg via ORAL
  Filled 2023-02-22: qty 1

## 2023-02-22 MED ORDER — ENOXAPARIN SODIUM 40 MG/0.4ML IJ SOSY: 40.0000 mg | PREFILLED_SYRINGE | INTRAMUSCULAR | Status: DC

## 2023-02-22 MED ORDER — SENNOSIDES-DOCUSATE SODIUM 8.6-50 MG PO TABS: 1.0000 | ORAL_TABLET | Freq: Every evening | ORAL | Status: DC | PRN

## 2023-02-22 MED ORDER — ONDANSETRON HCL 4 MG/2ML IJ SOLN
4.0000 mg | Freq: Four times a day (QID) | INTRAMUSCULAR | Status: DC | PRN
  Administered 2023-02-22: 4 mg via INTRAVENOUS
  Filled 2023-02-22 (×2): qty 2

## 2023-02-22 MED ORDER — METOPROLOL SUCCINATE ER 25 MG PO TB24
12.5000 mg | ORAL_TABLET | Freq: Every morning | ORAL | Status: DC
  Administered 2023-02-22: 12.5 mg via ORAL
  Filled 2023-02-22: qty 1

## 2023-02-22 MED ORDER — ACETAMINOPHEN 325 MG PO TABS: 650.0000 mg | ORAL_TABLET | Freq: Four times a day (QID) | ORAL | Status: DC | PRN

## 2023-02-22 MED ORDER — ASPIRIN 81 MG PO TBEC
81.0000 mg | DELAYED_RELEASE_TABLET | Freq: Every day | ORAL | Status: DC
  Administered 2023-02-22: 81 mg via ORAL
  Filled 2023-02-22: qty 1

## 2023-02-22 NOTE — Discharge Summary (Signed)
Physician Discharge Summary   Patient: Derek [name] MRN: [id number] DOB: [birth year]  Admit date:     02/21/2023  Discharge date: 02/22/23  Discharge Physician: Jacquelin Hawking, MD   PCP: [facility]   Recommendations at discharge:  PCP follow-up Repeat BMP in 3-5 days  Discharge Diagnoses: Principal Problem:   AKI (acute kidney injury) [facility]) Active Problems:   Dementia without behavioral disturbance (HCC)   Nausea and vomiting   Generalized weakness   Gout   CAD s/p CABG in 2019 at [facility]   Benign essential hypertension   Rheumatoid arthritis (HCC)  Resolved Problems:   * No resolved hospital problems. *  Hospital Course: Derek [name] is a 77 y.o. male with medical history significant for CAD s/p CABG 2019, T2DM, HTN, HLD, RA, gout, history of GI bleed due to gastric ulcer, PTSD, mild vascular dementia who is admitted with acute kidney injury secondary to nausea/vomiting. Patient started on IV fluids with improvement of creatinine. Nausea/vomiting resolved.  Assessment and Plan:  Acute kidney injury Baseline creatinine of about 0.8. Creatinine of 1.64 on admission secondary to dehydration from nausea and vomiting. IV fluids started with 4-hour improvement of creatinine to 1.49. Patient with resolution of nausea/vomiting.  Nausea and vomiting Unclear etiology. Resolved.  Generalized weakness Per history. PT/OT consulted and recommended home health services.  Vascular dementia Noted. Stable.  CAD s/p CABG Noted. Stable. No symptoms. Continue aspirin.  Primary hypertension Continue metoprolol succinate  Diabetes mellitus, type 2 Well controlled from last hemoglobin A1C of 5.4%.  Hyperlipidemia Continue Crestor  Rheumatoid arthritis Noted. Resume methotrexate on discharge.  Gout Continue home allopurinol.  OSA Noted. Not currently on CPAP.   Consultants: None Procedures performed: None  Disposition: Home health Diet recommendation:  Cardiac and Carb modified diet   DISCHARGE MEDICATION: Allergies as of 02/22/2023   No Known Allergies      Medication List     TAKE these medications    allopurinol 300 MG tablet Commonly known as: ZYLOPRIM Take 300 mg by mouth daily.   aspirin EC 81 MG tablet Take 81 mg by mouth daily. Swallow whole.   cholecalciferol 25 MCG (1000 UNIT) tablet Commonly known as: VITAMIN D3 Take 1,000 Units by mouth daily.   feeding supplement Liqd Take 1 Container by mouth daily.   FLUoxetine 20 MG capsule Commonly known as: PROZAC Take 20 mg by mouth daily.   furosemide 20 MG tablet Commonly known as: LASIX Take 0.5 tablets (10 mg total) by mouth daily as needed for fluid or edema. What changed:  how much to take when to take this   metFORMIN 500 MG 24 hr tablet Commonly known as: GLUCOPHAGE-XR Take 1,000 mg by mouth daily with breakfast.   methotrexate 2.5 MG tablet Commonly known as: RHEUMATREX Take 10 mg by mouth 2 (two) times a week. Friday & Saturday   metoprolol succinate 25 MG 24 hr tablet Commonly known as: TOPROL-XL Take 12.5 mg by mouth every morning.   rosuvastatin 20 MG tablet Commonly known as: CRESTOR Take 20 mg by mouth daily.        Discharge Exam: BP (!) 144/72 (BP Location: Left Arm)   Pulse (!) 59   Temp 98.4 F (36.9 C)   Resp 18   Ht 5\' 6"  (1.676 m)   Wt 77 kg   SpO2 95%   BMI 27.40 kg/m   General exam: Appears calm and comfortable Respiratory system: Clear to auscultation. Respiratory effort normal. Cardiovascular system: S1 &  S2 heard, RRR. 3/6 systolic murmur Gastrointestinal system: Abdomen is nondistended, soft and nontender.  Normal bowel sounds heard. Central nervous system: Alert and oriented. No focal neurological deficits. Musculoskeletal: No edema. No calf tenderness Skin: No cyanosis. No rashes Psychiatry: Judgement and insight appear normal. Mood & affect appropriate.   Condition at discharge: stable  The results of  significant diagnostics from this hospitalization (including imaging, microbiology, ancillary and laboratory) are listed below for reference.   Imaging Studies: DG Abd 1 View  Result Date: 02/22/2023 CLINICAL DATA:  Nausea and vomiting EXAM: ABDOMEN - 1 VIEW COMPARISON:  None Available. FINDINGS: Scattered large and small bowel gas is noted. No obstructive changes are seen. Postsurgical changes are noted in the hips bilaterally. No acute bony abnormality is seen. IMPRESSION: No acute abnormality noted. Electronically Signed   By: Alcide Clever M.D.   On: 02/22/2023 00:41   DG Chest 2 View  Result Date: 02/21/2023 CLINICAL DATA:  Dyspnea. EXAM: CHEST - 2 VIEW COMPARISON:  Chest radiographs 02/12/2023 and 02/25/2022 and 02/17/2022 FINDINGS: Status post median sternotomy. Cardiac silhouette is at the upper limits of normal size. Moderate atherosclerotic calcifications within the aortic arch. Mildly decreased lung volumes. Mild bibasilar subsegmental atelectasis. No pleural effusion pneumothorax. Mild multilevel degenerative disc changes of the thoracic spine. IMPRESSION: Mildly decreased lung volumes with mild bibasilar subsegmental atelectasis. Electronically Signed   By: Neita Garnet M.D.   On: 02/21/2023 23:21   ECHOCARDIOGRAM COMPLETE  Result Date: 02/13/2023    ECHOCARDIOGRAM REPORT   Patient Name:   Derek [name] Date of Exam: 02/13/2023 Medical Rec #:  [id number]           Height:       67.0 in Accession #:    [id number]          Weight:       169.8 lb Date of Birth:  05/31/1946           BSA:          1.886 m Patient Age:    76 years            BP:           114/63 mmHg Patient Gender: M                   HR:           52 bpm. Exam Location:  Inpatient Procedure: 2D Echo, Color Doppler, Cardiac Doppler and Intracardiac            Opacification Agent Indications:    R55 Syncope  History:        Patient has prior history of Echocardiogram examinations, most                 recent 12/02/2017. Prior  CABG; Risk Factors:Hypertension,                 Diabetes, Dyslipidemia and Sleep Apnea.  Sonographer:    Irving Burton Senior RDCS Referring Phys: Jonah Blue  Sonographer Comments: Definity used to assess for apical thrombus IMPRESSIONS  1. Left ventricular ejection fraction, by estimation, is 65 to 70%. The left ventricle has normal function. The left ventricle has no regional wall motion abnormalities. Left ventricular diastolic parameters are consistent with Grade I diastolic dysfunction (impaired relaxation).  2. Right ventricular systolic function is low normal. The right ventricular size is normal. Tricuspid regurgitation signal is inadequate for assessing PA pressure.  3. The mitral valve is degenerative. Trivial  mitral valve regurgitation. Moderate mitral annular calcification.  4. The aortic valve is tricuspid. There is moderate calcification of the aortic valve. There is moderate thickening of the aortic valve. Aortic valve regurgitation is mild. Mild aortic valve stenosis. Aortic valve mean gradient measures 11.0 mmHg. Aortic valve Vmax measures 2.30 m/s.  5. The inferior vena cava is normal in size with greater than 50% respiratory variability, suggesting right atrial pressure of 3 mmHg.  6. PFO present at time of CABG by intraoperative TEE report. Evidence of atrial level shunting detected by color flow Doppler. Comparison(s): Compared to prior echo report in 2019, there is no significant change. FINDINGS  Left Ventricle: Left ventricular ejection fraction, by estimation, is 65 to 70%. The left ventricle has normal function. The left ventricle has no regional wall motion abnormalities. Definity contrast agent was given IV to delineate the left ventricular  endocardial borders. The left ventricular internal cavity size was normal in size. There is borderline left ventricular hypertrophy. Left ventricular diastolic parameters are consistent with Grade I diastolic dysfunction (impaired relaxation). Right  Ventricle: The right ventricular size is normal. No increase in right ventricular wall thickness. Right ventricular systolic function is low normal. Tricuspid regurgitation signal is inadequate for assessing PA pressure. Left Atrium: Left atrial size was normal in size. Right Atrium: Right atrial size was normal in size. Pericardium: Trivial pericardial effusion is present. Presence of epicardial fat layer. Mitral Valve: The mitral valve is degenerative in appearance. There is moderate thickening of the mitral valve leaflet(s). There is moderate calcification of the mitral valve leaflet(s). Moderate mitral annular calcification. Trivial mitral valve regurgitation. Tricuspid Valve: The tricuspid valve is grossly normal. Tricuspid valve regurgitation is trivial. Aortic Valve: The aortic valve is tricuspid. There is moderate calcification of the aortic valve. There is moderate thickening of the aortic valve. Aortic valve regurgitation is mild. Mild aortic stenosis is present. Aortic valve mean gradient measures 11.0 mmHg. Aortic valve peak gradient measures 21.2 mmHg. Aortic valve area, by VTI measures 1.13 cm. Pulmonic Valve: The pulmonic valve was grossly normal. Pulmonic valve regurgitation is trivial. Aorta: The aortic root and ascending aorta are structurally normal, with no evidence of dilitation. Venous: The inferior vena cava is normal in size with greater than 50% respiratory variability, suggesting right atrial pressure of 3 mmHg. IAS/Shunts: Evidence of atrial level shunting detected by color flow Doppler.  LEFT VENTRICLE PLAX 2D LVIDd:         4.10 cm   Diastology LVIDs:         2.60 cm   LV e' medial:    7.29 cm/s LV PW:         1.00 cm   LV E/e' medial:  10.1 LV IVS:        1.10 cm   LV e' lateral:   8.92 cm/s LVOT diam:     1.80 cm   LV E/e' lateral: 8.3 LV SV:         53 LV SV Index:   28 LVOT Area:     2.54 cm  RIGHT VENTRICLE RV S prime:     11.30 cm/s TAPSE (M-mode): 1.4 cm LEFT ATRIUM              Index        RIGHT ATRIUM           Index LA diam:        3.60 cm 1.91 cm/m   RA Area:     13.70 cm  LA Vol (A2C):   32.5 ml 17.23 ml/m  RA Volume:   28.60 ml  15.16 ml/m LA Vol (A4C):   36.5 ml 19.35 ml/m LA Biplane Vol: 37.2 ml 19.72 ml/m  AORTIC VALVE AV Area (Vmax):    1.01 cm AV Area (Vmean):   1.15 cm AV Area (VTI):     1.13 cm AV Vmax:           230.00 cm/s AV Vmean:          140.000 cm/s AV VTI:            0.465 m AV Peak Grad:      21.2 mmHg AV Mean Grad:      11.0 mmHg LVOT Vmax:         91.40 cm/s LVOT Vmean:        63.000 cm/s LVOT VTI:          0.207 m LVOT/AV VTI ratio: 0.45  AORTA Ao Root diam: 3.00 cm Ao Asc diam:  3.50 cm MITRAL VALVE MV Area (PHT): 3.34 cm    SHUNTS MV Decel Time: 227 msec    Systemic VTI:  0.21 m MV E velocity: 73.90 cm/s  Systemic Diam: 1.80 cm MV A velocity: 95.70 cm/s MV E/A ratio:  0.77 Laurance Flatten MD Electronically signed by Laurance Flatten MD Signature Date/Time: 02/13/2023/2:19:34 PM    Final    EEG adult  Result Date: 02/12/2023 Charlsie Quest, MD     02/12/2023  6:54 PM Patient Name: JULIANNA CUARTAS MRN: [id number] Epilepsy Attending: Charlsie Quest Referring Physician/Provider: Jonah Blue, MD Date: 02/12/2023 Duration: 21.27 mins Patient history: 77yo M with syncope getting eeg to evaluate for seizure Level of alertness: Awake AEDs during EEG study: None Technical aspects: This EEG study was done with scalp electrodes positioned according to the 10-20 International system of electrode placement. Electrical activity was reviewed with band pass filter of 1-70Hz , sensitivity of 7 uV/mm, display speed of 98mm/sec with a 60Hz  notched filter applied as appropriate. EEG data were recorded continuously and digitally stored.  Video monitoring was available and reviewed as appropriate. Description: The posterior dominant rhythm consists of 8 Hz activity of moderate voltage (25-35 uV) seen predominantly in posterior head regions, symmetric and reactive to  eye opening and eye closing. EEG showed intermittent 2-3hz  Hz theta-delta slowing in left and right temporal region. Physiologic photic driving was not seen during photic stimulation.  Hyperventilation was not performed.   ABNORMALITY - Intermittent slow, left and right temporal region IMPRESSION: This study is suggestive of non specific cortical dysfunction arising from left and right temporal region. No seizures or epileptiform discharges were seen throughout the recording. Charlsie Quest   DG Chest Port 1 View  Result Date: 02/12/2023 CLINICAL DATA:  Syncope. EXAM: PORTABLE CHEST 1 VIEW COMPARISON:  Chest radiographs dated Feb 25, 2022 FINDINGS: The heart is enlarged with evidence of prior coronary artery bypass grafting. Low lung volumes with left basilar opacity suggesting atelectasis and/or small effusion. Thoracic spondylosis. IMPRESSION: Low lung volumes with left basilar opacity suggesting atelectasis and/or small effusion. Stable cardiomegaly. Electronically Signed   By: Larose Hires D.O.   On: 02/12/2023 11:45   CT Head Wo Contrast  Result Date: 02/12/2023 CLINICAL DATA:  77 year old male with altered mental status. EXAM: CT HEAD WITHOUT CONTRAST TECHNIQUE: Contiguous axial images were obtained from the base of the skull through the vertex without intravenous contrast. RADIATION DOSE REDUCTION: This exam was performed according to the  departmental dose-optimization program which includes automated exposure control, adjustment of the mA and/or kV according to patient size and/or use of iterative reconstruction technique. COMPARISON:  None Available. FINDINGS: Brain: No midline shift, ventriculomegaly, mass effect, evidence of mass lesion, intracranial hemorrhage or evidence of cortically based acute infarction. Confluent and widespread bilateral cerebral white matter hypodensity. Small area of chronic appearing cortical encephalomalacia right middle frontal gyrus (series 5, image 26). Deep gray  matter nuclei, brainstem and cerebellum relatively spared. Vascular: Calcified atherosclerosis at the skull base. No suspicious intracranial vascular hyperdensity. Skull: No acute osseous abnormality identified. Sinuses/Orbits: Scattered mild paranasal sinus mucosal thickening or small mucous retention cysts. Tympanic cavities and mastoids are clear. Other: Calcified scalp vessel atherosclerosis. No acute orbit or scalp soft tissue finding. IMPRESSION: 1. No acute intracranial abnormality identified. 2. 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 3. Advanced calcified atherosclerosis, including scalp vessel involvement frequently seen with End stage renal disease. Electronically Signed   By: Odessa Fleming M.D.   On: 02/12/2023 11:29    Microbiology: Results for orders placed or performed during the hospital encounter of 02/20/22  Blood Culture (routine x 2)     Status: None   Collection Time: 02/20/22 10:27 AM   Specimen: BLOOD  Result Value Ref Range Status   Specimen Description   Final    BLOOD RIGHT ANTECUBITAL Performed at [facility], [address], [place], [place] [address]    Special Requests   Final    BOTTLES DRAWN AEROBIC AND ANAEROBIC Blood Culture results may not be optimal due to an excessive volume of blood received in culture bottles Performed at [facility], [address] [place], [place] [address]    Culture   Final    NO GROWTH 5 DAYS Performed at [facility], [address] [place], [place] [address]    Report Status 02/25/2022 FINAL  Final  Resp Panel by RT-PCR (Flu A&B, Covid) Nasopharyngeal Swab     Status: None   Collection Time: 02/20/22 10:31 AM   Specimen: Nasopharyngeal Swab; Nasopharyngeal(NP) swabs in vial transport medium  Result Value Ref Range Status   SARS Coronavirus 2 by RT PCR NEGATIVE NEGATIVE Final    Comment: (NOTE) SARS-CoV-2 target nucleic  acids are NOT DETECTED.  The SARS-CoV-2 RNA is generally detectable in upper respiratory specimens during the acute phase of infection. The lowest concentration of SARS-CoV-2 viral copies this assay can detect is 138 copies/mL. A negative result does not preclude SARS-Cov-2 infection and should not be used as the sole basis for treatment or other patient management decisions. A negative result may occur with  improper specimen collection/handling, submission of specimen other than nasopharyngeal swab, presence of viral mutation(s) within the areas targeted by this assay, and inadequate number of viral copies(<138 copies/mL). A negative result must be combined with clinical observations, patient history, and epidemiological information. The expected result is Negative.  Fact Sheet for Patients:  BloggerCourse.com  Fact Sheet for Healthcare Providers:  SeriousBroker.it  This test is no t yet approved or cleared by the [place] FDA and  has been authorized for detection and/or diagnosis of SARS-CoV-2 by FDA under an Emergency Use Authorization (EUA). This EUA will remain  in effect (meaning this test can be used) for the duration of the COVID-19 declaration under Section 564(b)(1) of the Act, 21 U.S.C.section 360bbb-3(b)(1), unless the authorization is terminated  or revoked sooner.  Influenza A by PCR NEGATIVE NEGATIVE Final   Influenza B by PCR NEGATIVE NEGATIVE Final    Comment: (NOTE) The Xpert Xpress SARS-CoV-2/FLU/RSV plus assay is intended as an aid in the diagnosis of influenza from Nasopharyngeal swab specimens and should not be used as a sole basis for treatment. Nasal washings and aspirates are unacceptable for Xpert Xpress SARS-CoV-2/FLU/RSV testing.  Fact Sheet for Patients: BloggerCourse.com  Fact Sheet for Healthcare Providers: SeriousBroker.it  This  test is not yet approved or cleared by the [place] FDA and has been authorized for detection and/or diagnosis of SARS-CoV-2 by FDA under an Emergency Use Authorization (EUA). This EUA will remain in effect (meaning this test can be used) for the duration of the COVID-19 declaration under Section 564(b)(1) of the Act, 21 U.S.C. section 360bbb-3(b)(1), unless the authorization is terminated or revoked.  Performed at [facility], [address], [place], [place] [address]   Blood Culture (routine x 2)     Status: None   Collection Time: 02/20/22 10:40 AM   Specimen: BLOOD  Result Value Ref Range Status   Specimen Description   Final    BLOOD LEFT ANTECUBITAL Performed at [facility], [address], [place], [place] [address]    Special Requests   Final    BOTTLES DRAWN AEROBIC AND ANAEROBIC Blood Culture adequate volume Performed at [facility], [address] [place], [place] [address]    Culture   Final    NO GROWTH 5 DAYS Performed at [facility], [address], [place], [place] [address]    Report Status 02/25/2022 FINAL  Final  MRSA Next Gen by PCR, Nasal     Status: None   Collection Time: 02/20/22  1:09 PM   Specimen: Nasal Mucosa; Nasal Swab  Result Value Ref Range Status   MRSA by PCR Next Gen NOT DETECTED NOT DETECTED Final    Comment: (NOTE) The GeneXpert MRSA Assay (FDA approved for NASAL specimens only), is one component of a comprehensive MRSA colonization surveillance program. It is not intended to diagnose MRSA infection nor to guide or monitor treatment for MRSA infections. Test performance is not FDA approved in patients less than 46 years old. Performed at [facility], [address], [place], [place] [address]   Urine Culture     Status: None   Collection Time: 02/20/22  2:46 PM   Specimen: In/Out Cath Urine  Result Value Ref Range Status   Specimen  Description   Final    IN/OUT CATH URINE Performed at [facility], [address] [place], [place] [address]    Special Requests   Final    NONE Performed at [facility], [address] [place], [place] [address]    Culture   Final    NO GROWTH Performed at [facility], [address], [place], [place] [address]    Report Status 02/22/2022 FINAL  Final    Labs: CBC: Recent Labs  Lab 02/21/23 1850 02/22/23 0048  WBC 8.9 9.9  HGB 14.7 14.2  HCT 43.5 41.7  MCV 103.3* 101.7*  PLT 292 247   Basic Metabolic Panel: Recent Labs  Lab 02/21/23 1850 02/22/23 0048  NA 131* 132*  K 3.9 4.1  CL 98 101  CO2 21* 21*  GLUCOSE 162* 155*  BUN 18 17  CREATININE 1.64* 1.49*  CALCIUM 9.2 9.1   Liver Function Tests: Recent Labs  Lab 02/21/23 1850  AST 46*  ALT 30  ALKPHOS 66  BILITOT 0.9  PROT 8.1  ALBUMIN 3.5   CBG: Recent Labs  Lab 02/21/23 1901  GLUCAP 152*    Discharge time spent: 35 minutes.  Signed: Jacquelin Hawking, MD [facility] 02/22/2023

## 2023-02-22 NOTE — Discharge Instructions (Signed)
Derek [name],  You were in the hospital with dehydration and kidney impairment. This improved with IV fluids. Please follow-up with your PCP

## 2023-02-22 NOTE — Evaluation (Signed)
Physical Therapy Evaluation  Patient Details Name: Derek [name] MRN: [id number] DOB: 06-23-1946 Today's Date: 02/22/2023  History of Present Illness  Pt is a 77 y/o male who presents 02/21/2023 with generalized weakness and decreased PO intake. Pt admitted with an AKI. Pt with recent admission 02/12/2023-02/13/2023 for syncope evaluation and wife reports that he hasn't been himself since discharge home. PMH significant for CAD s/p CABG 2019, T2DM, HTN, RA, gout, history of GI bleed due to gastric ulcer, PTSD, mild vascular dementia.   Clinical Impression  Pt admitted with above diagnosis. Pt currently with functional limitations due to the deficits listed below (see PT Problem List). At the time of PT eval pt was able to perform transfers and ambulation with gross min guard assist to supervision for safety and RW for support. Pt reports he utilizes a rollator at home. He states at beginning of session he feels back to normal, however later in the session states he is not at his normal, reporting he is "tiring out fast". Given recent admission, will keep on acute PT caseload to monitor for functional decline until d/c, and recommend continued OOB mobility with mobility specialists and nursing staff as able. Acutely, pt will benefit from acute skilled PT to increase their independence and safety with mobility to allow discharge.          Recommendations for follow up therapy are one component of a multi-disciplinary discharge planning process, led by the attending physician.  Recommendations may be updated based on patient status, additional functional criteria and insurance authorization.  Follow Up Recommendations       Assistance Recommended at Discharge Frequent or constant Supervision/Assistance  Patient can return home with the following  A little help with bathing/dressing/bathroom;Assistance with cooking/housework;Assist for transportation;Direct supervision/assist for medications  management;Direct supervision/assist for financial management;Help with stairs or ramp for entrance    Equipment Recommendations None recommended by PT  Recommendations for Other Services       Functional Status Assessment Patient has had a recent decline in their functional status and demonstrates the ability to make significant improvements in function in a reasonable and predictable amount of time.     Precautions / Restrictions Precautions Precautions: Fall Restrictions Weight Bearing Restrictions: No      Mobility  Bed Mobility               General bed mobility comments: Pt received sitting up in the recliner    Transfers Overall transfer level: Needs assistance Equipment used: Rolling walker (2 wheels) Transfers: Sit to/from Stand Sit to Stand: Supervision           General transfer comment: Pt demonstrating good hand placement on seated surface for safety. No assist required and no LOB noted.    Ambulation/Gait Ambulation/Gait assistance: Supervision Gait Distance (Feet): 175 Feet Assistive device: Rolling walker (2 wheels) Gait Pattern/deviations: Step-through pattern, Decreased stride length Gait velocity: Decreased Gait velocity interpretation: 1.31 - 2.62 ft/sec, indicative of limited community ambulator   General Gait Details: Slow but generally steady without overt LOB noted. VC's throughout for improved posture, closer walker proximity and forward gaze. Able to make corrective changes for short bouts and unable to maintain.  Stairs            Wheelchair Mobility    Modified Rankin (Stroke Patients Only)       Balance Overall balance assessment: No apparent balance deficits (not formally assessed) Sitting-balance support: Feet supported, No upper extremity supported Sitting balance-Leahy Scale: Fair Sitting balance -  Comments: sitting EOB. No posterior lean noted or LOB Postural control: Posterior lean Standing balance support:  Bilateral upper extremity supported, Reliant on assistive device for balance, During functional activity Standing balance-Leahy Scale: Fair Standing balance comment: light grasp on RW during functional transfer.                             Pertinent Vitals/Pain Pain Assessment Pain Assessment: No/denies pain    Home Living Family/patient expects to be discharged to:: Private residence Living Arrangements: Spouse/significant other Available Help at Discharge: Family;Personal care attendant;Available 24 hours/day Type of Home: House Home Access: Stairs to enter Entrance Stairs-Rails: Left Entrance Stairs-Number of Steps: 3 Alternate Level Stairs-Number of Steps: flight, stair lift Home Layout: Two level;Bed/bath upstairs Home Equipment: Rolling Walker (2 wheels);Shower seat;Rollator (4 wheels) Additional Comments: has aide 3 days/week for ~6 hrs/day    Prior Function Prior Level of Function : Needs assist  Cognitive Assist : ADLs (cognitive);Mobility (cognitive)           Mobility Comments: Pt reports using the rollator at home and taking seated rest breaks for longer distances. ADLs Comments: spouse assists with all  BADL tasks; spouse is present 24/7 and does not leave pt home alone     Hand Dominance   Dominant Hand: Right    Extremity/Trunk Assessment   Upper Extremity Assessment Upper Extremity Assessment: Defer to OT evaluation RUE Deficits / Details: A/ROM WFL in all ranges. Shoulder flexion/abduction: 4-/5, IR/er: 5/5, elbow flexion/extension: 5/5. decreased gross grasp. LUE Deficits / Details: limited shoulder ROM. Able to achieve shoulder flexion and abduction to just under 90 degrees. All other ranges for shoulder, elbow. wrist and hand are [facility]. MMT: shoulder flexion/abduction: 3-/5, IR/er: 4+/5, elbow flexion/extension: 5/5. Decreased gross grasp.    Lower Extremity Assessment Lower Extremity Assessment: Generalized weakness (Bilaterally: 4+/5  strength in quads, 4-/5 in hip flexors, 4/5 in hamstrings)    Cervical / Trunk Assessment Cervical / Trunk Assessment: Other exceptions Cervical / Trunk Exceptions: Forward head posture with rounded shoulders  Communication   Communication: No difficulties  Cognition Arousal/Alertness: Awake/alert Behavior During Therapy: Flat affect Overall Cognitive Status: History of cognitive impairments - at baseline                                          General Comments General comments (skin integrity, edema, etc.): No family present in room during evaluation.    Exercises     Assessment/Plan    PT Assessment Patient needs continued PT services  PT Problem List Decreased activity tolerance;Decreased balance;Decreased mobility;Cardiopulmonary status limiting activity       PT Treatment Interventions DME instruction;Gait training;Functional mobility training;Therapeutic activities;Therapeutic exercise;Balance training;Patient/family education    PT Goals (Current goals can be found in the Care Plan section)  Acute Rehab PT Goals PT Goal Formulation: With patient Time For Goal Achievement: 03/01/23 Potential to Achieve Goals: Good    Frequency Min 3X/week     Co-evaluation               AM-PAC PT "6 Clicks" Mobility  Outcome Measure Help needed turning from your back to your side while in a flat bed without using bedrails?: A Little Help needed moving from lying on your back to sitting on the side of a flat bed without using bedrails?: A Little Help needed moving  to and from a bed to a chair (including a wheelchair)?: A Little Help needed standing up from a chair using your arms (e.g., wheelchair or bedside chair)?: A Little Help needed to walk in hospital room?: A Little Help needed climbing 3-5 steps with a railing? : A Little 6 Click Score: 18    End of Session Equipment Utilized During Treatment: Gait belt Activity Tolerance: Patient tolerated  treatment well Patient left: in bed;with call bell/phone within reach;with family/visitor present Nurse Communication: Mobility status PT Visit Diagnosis: Other abnormalities of gait and mobility (R26.89)    Time: 1610-9604 PT Time Calculation (min) (ACUTE ONLY): 16 min   Charges:   PT Evaluation $PT Eval Low Complexity: 1 Low          Conni Slipper, PT, DPT Acute Rehabilitation Services Secure Chat Preferred Office: [phone]   Marylynn Pearson 02/22/2023, 2:11 PM

## 2023-02-22 NOTE — Evaluation (Signed)
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   H/O: upper GI bleed 01/2001    related to nonsteroidal use, CLO test +(Med Tx- Prilosec, Amoxicillin, & Biaxin)   Hypertension    Insomnia    Mental disorder    PTSD   Peptic ulcer disease    history of gastric ulcer   PTSD (post-traumatic stress disorder)    [facility]   Sarcoidosis    late 1970s   Scleritis    Sleep apnea    cpap with O2 4L for 4 yrs   Tubular adenoma 2004   Past Surgical History:  Past Surgical History:  Procedure Laterality Date   CARDIAC CATHETERIZATION     CORONARY ARTERY BYPASS GRAFT  12/02/2017   at [facility] Dr Ty Hilts Surgeon   EYE SURGERY  05   growth lft   HIP PINNING,CANNULATED Left 02/12/2022   Procedure: CANNULATED HIP PINNING;  Surgeon: Terance Hart, MD;  Location: [facility] ORS;  Service: Orthopedics;  Laterality: Left;   TOTAL HIP ARTHROPLASTY  06/13/2012   TOTAL HIP ARTHROPLASTY  06/13/2012   Procedure: TOTAL HIP ARTHROPLASTY ANTERIOR APPROACH;  Surgeon: Velna Ochs, MD;  Location: [facility] OR;  Service: Orthopedics;  Laterality: Right;  right anterior approach total hip arthroplasty   HPI:  Derek [name] is a 77 y.o. male  who presented to the ED for evaluation of generalized weakness. Since a recent discharge he has had poor oral intake with low appetite. Discussed with wife who reports he sometimes gags on solids; she has to cut up food and slow him down sometimes. He tolerates drinking liquids well. Has decreased appetite. with medical history significant for CAD s/p CABG 2019, T2DM, HTN, HLD, RA,  gout, history of GI bleed due to gastric ulcer, PTSD, mild vascular dementia. MBS in 2023 showing Patient presents with moderate oral and mild pharyngeal dysphagia c/b decreased oral control with premature spillage of all boluses into pharynx. Penetration of thin liquids in large quantity only. Needed puree bolus for oral bolus cohesion with solids    Assessment / Plan / Recommendation  Clinical Impression  Pt did not exhibit any signs of oropharyngeal dysphaing under observation today. He is not a descriptive historian. Wife does reports some struggle with solids at times at meals in particular. She describes 'gagging.' Reinforced basic precuations - soft textures, following bites with sips, having puree available to assist with transit. Recommend f/u with home health SLP for therapy in normal setting to monitor for appropriate textures and signs of esophageal dysphagia. SLP Visit Diagnosis: Dysphagia, unspecified (R13.10)    Aspiration Risk  Risk for inadequate nutrition/hydration    Diet Recommendation Dysphagia 3 (Mech soft);Thin liquid   Liquid Administration via: Cup;Straw Medication Administration: Whole meds with liquid Supervision: Patient able to self feed Compensations: Slow rate;Small sips/bites;Follow solids with liquid Postural Changes: Seated upright at 90 degrees    Other  Recommendations Recommended Consults: Consider esophageal assessment    Recommendations for follow up therapy are one component of a multi-disciplinary discharge planning process, led by the attending physician.  Recommendations may be updated based on patient status, additional functional criteria and insurance authorization.  Follow up Recommendations Home health SLP      Assistance Recommended at Discharge    Functional Status Assessment Patient has had a recent decline in their functional status and demonstrates the ability to make significant improvements in function in a reasonable and predictable  amount of time.  Frequency and Duration            Prognosis        Swallow Study   General HPI: Derek [name] is a 77 y.o. male  who presented to the ED for evaluation of generalized weakness. Since a recent discharge he has had poor oral intake with low appetite. Discussed with wife who reports he sometimes gags on solids; she has to cut up food and slow him down sometimes. He tolerates drinking liquids well. Has decreased appetite. with medical history significant for CAD s/p CABG 2019, T2DM, HTN, HLD, RA, gout, history of GI bleed due to gastric ulcer, PTSD, mild vascular dementia. MBS in 2023 showing Patient presents with moderate oral and mild pharyngeal dysphagia c/b decreased oral control with premature spillage of all boluses into pharynx. Penetration of thin liquids in large quantity only. Needed puree bolus for oral bolus cohesion with solids Type of Study: MBS-Modified Barium Swallow Study Previous Swallow Assessment: see HPI Diet Prior to this Study: Regular;Thin liquids (Level 0) Temperature Spikes Noted: No Respiratory Status: Room air History of Recent Intubation: No Behavior/Cognition: Alert;Cooperative;Pleasant mood Oral Cavity Assessment: Within Functional Limits Oral Care Completed by SLP: No Oral Cavity - Dentition: Adequate natural dentition Vision: Functional for self-feeding Self-Feeding Abilities: Able to feed self Patient Positioning: Upright in chair Baseline Vocal Quality: Normal Volitional Cough: Strong Volitional Swallow: Able to elicit    Oral/Motor/Sensory Function Overall Oral Motor/Sensory Function: Within functional limits   Ice Chips Ice chips: Not tested   Thin Liquid Thin Liquid: Within functional limits Presentation: Straw    Nectar Thick Nectar Thick Liquid: Not tested   Honey Thick Honey Thick Liquid: Not tested   Puree Puree: Within functional limits Presentation: Self Fed   Solid     Solid: Within functional  limits Presentation: Self Fed      Derek [name], Derek [name] 02/22/2023,2:29 PM

## 2023-02-22 NOTE — TOC Transition Note (Signed)
Transition of Care [facility]) - CM/SW Discharge Note   Patient Details  Name: Derek [name] MRN: [id number] Date of Birth: 1946/05/23  [facility] CM/SW Contact:  Tom-Johnson, Hershal Coria, RN Phone Number: 02/22/2023, 3:36 PM   Clinical Narrative:     Patient is scheduled for discharge today.  Home health referral called in to [facility] for resumption of care and info on AVS. Outpatient f/u, hospital f/u and discharge instructions on AVS. Wife, Derek [name] to transport at discharge.  No further TOC needs noted.        Final next level of care: Home w Home Health Services Barriers to Discharge: Barriers Resolved   Patient Goals and CMS Choice CMS Medicare.gov Compare Post Acute Care list provided to:: Patient Choice offered to / list presented to : Patient, Spouse Derek [name])  Discharge Placement                  Patient to be transferred to facility by: Wife Name of family member notified: Derek [name]    Discharge Plan and Services Additional resources added to the After Visit Summary for                  DME Arranged: N/A DME Agency: NA       HH Arranged: PT, OT, Speech Therapy HH Agency: [facility] Date [facility] Agency Contacted: 02/22/23 Time HH Agency Contacted: 1530 Representative spoke with at [facility] Agency: Derek [name]  Social Determinants of Health (SDOH) Interventions SDOH Screenings   Food Insecurity: No Food Insecurity (02/12/2023)  Housing: Low Risk  (02/12/2023)  Transportation Needs: No Transportation Needs (02/12/2023)  Utilities: Not At Risk (02/12/2023)  Physical Activity: Inactive (03/07/2018)  Stress: No Stress Concern Present (03/07/2018)  Tobacco Use: Low Risk  (02/21/2023)     Readmission Risk Interventions     No data to display

## 2023-02-22 NOTE — ED Notes (Signed)
ED TO INPATIENT HANDOFF REPORT  ED Nurse Name and Phone #: Alycia Rossetti [phone]  S Name/Age/Gender Derek [name] 77 y.o. male Room/Bed: 038C/038C  Code Status   Code Status: Full Code  Home/SNF/Other Home Patient oriented to: self, place, time, and situation Is this baseline? Yes   Triage Complete: Triage complete  Chief Complaint AKI (acute kidney injury) [facility]) [N17.9]  Triage Note Wife reports he was just discharged and has not been right since leaving the hospital.  Reports he had another seizure like episode today.  Reports had a low grade fever  Reports no appetitie.  Reports he has not been himself since being discharged.    Allergies No Known Allergies  Level of Care/Admitting Diagnosis ED Disposition     ED Disposition  Admit   Condition  --   Comment  Hospital Area: [facility] [[id number]]  Level of Care: Med-Surg [16]  May place patient in observation at [facility] or [facility] if equivalent level of care is available:: No  Covid Evaluation: Asymptomatic - no recent exposure (last 10 days) testing not required  Diagnosis: AKI (acute kidney injury) [facility]) [[id number]]  Admitting Physician: Charlsie Quest [[id number]]  Attending Physician: Charlsie Quest [[id number]]          B Medical/Surgery History 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   H/O: upper GI bleed 01/2001    related to nonsteroidal use, CLO test +(Med Tx- Prilosec, Amoxicillin, & Biaxin)   Hypertension    Insomnia    Mental disorder    PTSD   Peptic ulcer disease    history of gastric ulcer   PTSD (post-traumatic stress disorder)    [facility]   Sarcoidosis    late 1970s   Scleritis    Sleep apnea    cpap with O2 4L for 4 yrs   Tubular adenoma 2004   Past Surgical History:  Procedure Laterality Date   CARDIAC CATHETERIZATION     CORONARY ARTERY BYPASS  GRAFT  12/02/2017   at [facility] Dr Ty Hilts Surgeon   EYE SURGERY  05   growth lft   HIP PINNING,CANNULATED Left 02/12/2022   Procedure: CANNULATED HIP PINNING;  Surgeon: Terance Hart, MD;  Location: [facility] ORS;  Service: Orthopedics;  Laterality: Left;   TOTAL HIP ARTHROPLASTY  06/13/2012   TOTAL HIP ARTHROPLASTY  06/13/2012   Procedure: TOTAL HIP ARTHROPLASTY ANTERIOR APPROACH;  Surgeon: Velna Ochs, MD;  Location: [facility] OR;  Service: Orthopedics;  Laterality: Right;  right anterior approach total hip arthroplasty     A IV Location/Drains/Wounds Patient Lines/Drains/Airways Status     Active Line/Drains/Airways     Name Placement date Placement time Site Days   Peripheral IV 02/21/23 20 G 1" Anterior;Left Forearm 02/21/23  2230  Forearm  1   External Urinary Catheter 02/20/22  2213  --  367   Pressure Injury 02/25/22 Buttocks Left;Mid Stage 2 -  Partial thickness loss of dermis presenting as a shallow open injury with a red, pink wound bed without slough. Small, shallow, open, red area on left buttocks towards midline 02/25/22  0541  -- 362   Pressure Injury 02/28/22 Thigh Posterior;Proximal;Right;Lower Stage 2 -  Partial thickness loss of dermis presenting as a shallow open injury with a red, pink wound bed without slough. 02/28/22  1400  -- 359   Pressure Injury 03/04/22 Sacrum Mid Stage 2 -  Partial thickness loss of dermis presenting as a shallow open injury with a red, pink wound bed without slough. 03/04/22  0006  -- 355            Intake/Output Last 24 hours No intake or output data in the 24 hours ending 02/22/23 0028  Labs/Imaging Results for orders placed or performed during the hospital encounter of 02/21/23 (from the past 48 hour(s))  Basic metabolic panel     Status: Abnormal   Collection Time: 02/21/23  6:50 PM  Result Value Ref Range   Sodium 131 (L) 135 - 145 mmol/L   Potassium 3.9 3.5 - 5.1 mmol/L   Chloride 98 98 - 111 mmol/L   CO2 21 (L) 22 - 32 mmol/L    Glucose, Bld 162 (H) 70 - 99 mg/dL    Comment: Glucose reference range applies only to samples taken after fasting for at least 8 hours.   BUN 18 8 - 23 mg/dL   Creatinine, Ser 4.09 (H) 0.61 - 1.24 mg/dL   Calcium 9.2 8.9 - 81.1 mg/dL   GFR, Estimated 44 (L) >60 mL/min    Comment: (NOTE) Calculated using the CKD-EPI Creatinine Equation (2021)    Anion gap 12 5 - 15    Comment: Performed at [facility], [address] [place], [place] [address]  CBC     Status: Abnormal   Collection Time: 02/21/23  6:50 PM  Result Value Ref Range   WBC 8.9 4.0 - 10.5 K/uL   RBC 4.21 (L) 4.22 - 5.81 MIL/uL   Hemoglobin 14.7 13.0 - 17.0 g/dL   HCT 29.5 62.1 - 30.8 %   MCV 103.3 (H) 80.0 - 100.0 fL   MCH 34.9 (H) 26.0 - 34.0 pg   MCHC 33.8 30.0 - 36.0 g/dL   RDW 65.7 84.6 - 96.2 %   Platelets 292 150 - 400 K/uL   nRBC 0.0 0.0 - 0.2 %    Comment: Performed at [facility], [address] [place], [place] [address]  TSH     Status: None   Collection Time: 02/21/23  6:50 PM  Result Value Ref Range   TSH 1.619 0.350 - 4.500 uIU/mL    Comment: Performed by a 3rd Generation assay with a functional sensitivity of <=0.01 uIU/mL. Performed at [facility], [address], [place], [place] [address]   Hepatic function panel     Status: Abnormal   Collection Time: 02/21/23  6:50 PM  Result Value Ref Range   Total Protein 8.1 6.5 - 8.1 g/dL   Albumin 3.5 3.5 - 5.0 g/dL   AST 46 (H) 15 - 41 U/L   ALT 30 0 - 44 U/L   Alkaline Phosphatase 66 38 - 126 U/L   Total Bilirubin 0.9 0.3 - 1.2 mg/dL   Bilirubin, Direct 0.2 0.0 - 0.2 mg/dL   Indirect Bilirubin 0.7 0.3 - 0.9 mg/dL    Comment: Performed at [facility], [address] [place], [place] [address]  CBG monitoring, ED     Status: Abnormal   Collection Time: 02/21/23  7:01 PM  Result Value Ref Range   Glucose-Capillary 152 (H) 70 - 99 mg/dL    Comment: Glucose reference range applies only to samples taken after fasting for at  least 8 hours.  Urinalysis, Routine w reflex microscopic -Urine, Clean Catch  Status: Abnormal   Collection Time: 02/21/23  8:57 PM  Result Value Ref Range   Color, Urine YELLOW YELLOW   APPearance HAZY (A) CLEAR   Specific Gravity, Urine 1.025 1.005 - 1.030   pH 5.0 5.0 - 8.0   Glucose, UA NEGATIVE NEGATIVE mg/dL   Hgb urine dipstick SMALL (A) NEGATIVE   Bilirubin Urine NEGATIVE NEGATIVE   Ketones, ur NEGATIVE NEGATIVE mg/dL   Protein, ur 161 (A) NEGATIVE mg/dL   Nitrite NEGATIVE NEGATIVE   Leukocytes,Ua NEGATIVE NEGATIVE   RBC / HPF 0-5 0 - 5 RBC/hpf   WBC, UA 0-5 0 - 5 WBC/hpf   Bacteria, UA RARE (A) NONE SEEN   Squamous Epithelial / HPF 0-5 0 - 5 /HPF   Mucus PRESENT    Crystals PRESENT (A) NEGATIVE    Comment: Performed at [facility], [address] [place], [place] [address]   DG Chest 2 View  Result Date: 02/21/2023 CLINICAL DATA:  Dyspnea. EXAM: CHEST - 2 VIEW COMPARISON:  Chest radiographs 02/12/2023 and 02/25/2022 and 02/17/2022 FINDINGS: Status post median sternotomy. Cardiac silhouette is at the upper limits of normal size. Moderate atherosclerotic calcifications within the aortic arch. Mildly decreased lung volumes. Mild bibasilar subsegmental atelectasis. No pleural effusion pneumothorax. Mild multilevel degenerative disc changes of the thoracic spine. IMPRESSION: Mildly decreased lung volumes with mild bibasilar subsegmental atelectasis. Electronically Signed   By: Neita Garnet M.D.   On: 02/21/2023 23:21    Pending Labs Unresulted Labs (From admission, onward)     Start     Ordered   02/22/23 0500  CBC  Tomorrow morning,   R        02/22/23 0025   02/22/23 0500  Basic metabolic panel  Tomorrow morning,   R        02/22/23 0025   02/22/23 0021  Lipase, blood  Once,   AD        02/22/23 0020            Vitals/Pain Today's Vitals   02/21/23 1822 02/21/23 1826 02/21/23 2150  BP: (!) 140/86  (!) 164/89  Pulse: 80  78  Resp: 16  18  Temp: 99.7  F (37.6 C)  98.3 F (36.8 C)  TempSrc: Oral    SpO2: 97%  96%  Weight:  76.7 kg   Height:  5\' 7"  (1.702 m)   PainSc:  0-No pain     Isolation Precautions No active isolations  Medications Medications  ondansetron (ZOFRAN) injection 4 mg (has no administration in time range)  lactated ringers infusion (has no administration in time range)  enoxaparin (LOVENOX) injection 40 mg (has no administration in time range)  acetaminophen (TYLENOL) tablet 650 mg (has no administration in time range)    Or  acetaminophen (TYLENOL) suppository 650 mg (has no administration in time range)  senna-docusate (Senokot-S) tablet 1 tablet (has no administration in time range)  allopurinol (ZYLOPRIM) tablet 300 mg (has no administration in time range)  aspirin EC tablet 81 mg (has no administration in time range)  FLUoxetine (PROZAC) capsule 20 mg (has no administration in time range)  metoprolol succinate (TOPROL-XL) 24 hr tablet 12.5 mg (has no administration in time range)  rosuvastatin (CRESTOR) tablet 20 mg (has no administration in time range)  lactated ringers bolus 1,000 mL (1,000 mLs Intravenous New Bag/Given 02/21/23 2333)    Mobility Walks with device     Focused Assessments    R Recommendations: See Admitting Provider Note  Report given to:   Additional Notes: Pt is pleasant gentleman here for increased weakness and tiredness. Was here last week for the same thing. Likely another observation visit.

## 2023-02-22 NOTE — Hospital Course (Addendum)
Derek [name] is a 76 y.o. male with medical history significant for CAD s/p CABG 2019, T2DM, HTN, HLD, RA, gout, history of GI bleed due to gastric ulcer, PTSD, mild vascular dementia who is admitted with acute kidney injury secondary to nausea/vomiting. Patient started on IV fluids with improvement of creatinine. Nausea/vomiting resolved.

## 2023-02-22 NOTE — Evaluation (Signed)
Occupational Therapy Evaluation Patient Details Name: Derek [name] MRN: [id number] DOB: Jan 07, 1946 Today's Date: 02/22/2023   History of Present Illness 77 y.o. male with medical history significant for CAD s/p CABG 2019, T2DM, HTN, HLD, RA, gout, history of GI bleed due to gastric ulcer, PTSD, mild vascular dementia who presented to the ED for evaluation of generalized weakness. Patient recently admitted 02/12/2023-02/13/2023 for syncope evaluation. Wife reports that he hasn't been himself since discharge home.   Clinical Impression   Patient evaluated by Occupational Therapy with no further acute OT needs identified. Pt has 24/7 supervision/assist from wife and/or personal aide when completing BADL tasks. Pt has all necessary DME. All education has been completed and the patient has no further questions. Recommend returning home with wife when medically cleared and continue to receive assistance with BADL tasks. No follow-up Occupational Therapy needs identified. OT is signing off. Thank you for this referral.       Recommendations for follow up therapy are one component of a multi-disciplinary discharge planning process, led by the attending physician.  Recommendations may be updated based on patient status, additional functional criteria and insurance authorization.   Assistance Recommended at Discharge Frequent or constant Supervision/Assistance  Patient can return home with the following A little help with walking and/or transfers;A lot of help with bathing/dressing/bathroom;Assistance with feeding;Help with stairs or ramp for entrance;Direct supervision/assist for financial management;Direct supervision/assist for medications management    Functional Status Assessment  Patient has not had a recent decline in their functional status  Equipment Recommendations  None recommended by OT       Precautions / Restrictions Precautions Precautions: Fall Restrictions Weight Bearing  Restrictions: No      Mobility Bed Mobility Overal bed mobility: Modified Independent Bed Mobility: Supine to Sit     Supine to sit: Modified independent (Device/Increase time)     General bed mobility comments: no VC for technique while transitioning from supine to sitting EOB. Increased time required. Patient Response: Cooperative  Transfers Overall transfer level: Needs assistance Equipment used: Rolling walker (2 wheels) Transfers: Sit to/from Stand, Bed to chair/wheelchair/BSC Sit to Stand: Supervision     Step pivot transfers: Supervision     General transfer comment: Utilized RW. VC provided for hand placement when performing sit<>stand transition. Able to manage RW safely during transfer.      Balance Overall balance assessment: No apparent balance deficits (not formally assessed) Sitting-balance support: Feet supported, No upper extremity supported Sitting balance-Leahy Scale: Fair Sitting balance - Comments: sitting EOB. No posterior lean noted or LOB   Standing balance support: Bilateral upper extremity supported, Reliant on assistive device for balance, During functional activity Standing balance-Leahy Scale: Fair Standing balance comment: light grasp on RW during functional transfer.        ADL either performed or assessed with clinical judgement   ADL Overall ADL's : At baseline;Needs assistance/impaired      General ADL Comments: Pt receives assist with all BADL at baseline from wife and/or aide.     Vision Baseline Vision/History: 1 Wears glasses Ability to See in Adequate Light: 0 Adequate Patient Visual Report: No change from baseline Vision Assessment?: No apparent visual deficits Additional Comments: reports he is supposed to wear his glasses all the time although he does not. Glasses not with patient in room.            Pertinent Vitals/Pain Pain Assessment Pain Assessment: No/denies pain     Hand Dominance Right   Extremity/Trunk  Assessment Upper  Extremity Assessment Upper Extremity Assessment: Generalized weakness;LUE deficits/detail;RUE deficits/detail RUE Deficits / Details: A/ROM WFL in all ranges. Shoulder flexion/abduction: 4-/5, IR/er: 5/5, elbow flexion/extension: 5/5. decreased gross grasp. LUE Deficits / Details: limited shoulder ROM. Able to achieve shoulder flexion and abduction to just under 90 degrees. All other ranges for shoulder, elbow. wrist and hand are [facility]. MMT: shoulder flexion/abduction: 3-/5, IR/er: 4+/5, elbow flexion/extension: 5/5. Decreased gross grasp.   Lower Extremity Assessment Lower Extremity Assessment: Defer to PT evaluation   Cervical / Trunk Assessment Cervical / Trunk Assessment: Normal   Communication Communication Communication: No difficulties   Cognition Arousal/Alertness: Awake/alert Behavior During Therapy: WFL for tasks assessed/performed Overall Cognitive Status: History of cognitive impairments - at baseline  General Comments: oriented to place, person, and year. Pleasant and able to follow simple commands during evaluation.     General Comments  No family present in room during evaluation.            Home Living Family/patient expects to be discharged to:: Private residence Living Arrangements: Spouse/significant other Available Help at Discharge: Family;Personal care attendant;Available 24 hours/day Type of Home: House Home Access: Stairs to enter [employer] of Steps: 3 Entrance Stairs-Rails: Left Home Layout: Two level;Bed/bath upstairs Alternate Level Stairs-Number of Steps: flight, stair lift   Bathroom Shower/Tub: Producer, television/film/video: Handicapped height Bathroom Accessibility: Yes   Home Equipment: Agricultural consultant (2 wheels);Shower seat;Rollator (4 wheels)   Additional Comments: has aide 3 days/week for ~6 hrs/day      Prior Functioning/Environment Prior Level of Function : Needs assist  Cognitive Assist : ADLs  (cognitive);Mobility (cognitive)           Mobility Comments: per chart review: able to ambulate with RW at home without physical aslsistance. Wife and/or aide provides SBA. Primarily sendentary. ADLs Comments: spouse assists with all  BADL tasks; spouse is present 24/7 and does not leave pt home alone        OT Problem List: Decreased strength;Decreased activity tolerance;Impaired balance (sitting and/or standing)      OT Treatment/Interventions:   Self care/home management   OT Goals(Current goals can be found in the care plan section) Acute Rehab OT Goals Patient Stated Goal: none stated  OT Frequency:  1X visit       AM-PAC OT "6 Clicks" Daily Activity     Outcome Measure Help from another person eating meals?: A Little Help from another person taking care of personal grooming?: A Little Help from another person toileting, which includes using toliet, bedpan, or urinal?: A Little Help from another person bathing (including washing, rinsing, drying)?: A Lot Help from another person to put on and taking off regular upper body clothing?: A Little Help from another person to put on and taking off regular lower body clothing?: A Lot 6 Click Score: 16   End of Session Equipment Utilized During Treatment: Gait belt;Rolling walker (2 wheels)  Activity Tolerance: Patient tolerated treatment well Patient left: in chair;with call bell/phone within reach;with chair alarm set  OT Visit Diagnosis: Unsteadiness on feet (R26.81);Other abnormalities of gait and mobility (R26.89);Muscle weakness (generalized) (M62.81)                Time: 4098-1191 OT Time Calculation (min): 23 min Charges:  OT General Charges $OT Visit: 1 Visit OT Evaluation $OT Eval Moderate Complexity: 1 Mod OT Treatments $Self Care/Home Management : 8-22 mins  Limmie Patricia, OTR/L,CBIS  Supplemental OT - [facility] and [facility] Secure Chat Preferred  Ardella Chhim, Charisse March 02/22/2023, 12:40 PM

## 2023-02-23 ENCOUNTER — Emergency Department (HOSPITAL_COMMUNITY): Payer: No Typology Code available for payment source

## 2023-02-23 ENCOUNTER — Inpatient Hospital Stay (HOSPITAL_COMMUNITY)
Admission: EM | Admit: 2023-02-23 | Discharge: 2023-02-25 | DRG: 694 | Disposition: A | Discharge status: Home / Self Care | Payer: No Typology Code available for payment source | Attending: Internal Medicine | Admitting: Internal Medicine

## 2023-02-23 ENCOUNTER — Encounter (HOSPITAL_COMMUNITY): Payer: Self-pay | Admitting: Family Medicine

## 2023-02-23 DIAGNOSIS — Z951 Presence of aortocoronary bypass graft: Secondary | ICD-10-CM

## 2023-02-23 DIAGNOSIS — F0390 Unspecified dementia without behavioral disturbance: Secondary | ICD-10-CM | POA: Diagnosis present

## 2023-02-23 DIAGNOSIS — Z7982 Long term (current) use of aspirin: Secondary | ICD-10-CM

## 2023-02-23 DIAGNOSIS — E1169 Type 2 diabetes mellitus with other specified complication: Secondary | ICD-10-CM | POA: Diagnosis present

## 2023-02-23 DIAGNOSIS — E119 Type 2 diabetes mellitus without complications: Secondary | ICD-10-CM | POA: Diagnosis present

## 2023-02-23 DIAGNOSIS — M069 Rheumatoid arthritis, unspecified: Secondary | ICD-10-CM | POA: Diagnosis present

## 2023-02-23 DIAGNOSIS — K8020 Calculus of gallbladder without cholecystitis without obstruction: Secondary | ICD-10-CM | POA: Diagnosis present

## 2023-02-23 DIAGNOSIS — Z7984 Long term (current) use of oral hypoglycemic drugs: Secondary | ICD-10-CM | POA: Diagnosis not present

## 2023-02-23 DIAGNOSIS — I10 Essential (primary) hypertension: Secondary | ICD-10-CM | POA: Diagnosis present

## 2023-02-23 DIAGNOSIS — Z96641 Presence of right artificial hip joint: Secondary | ICD-10-CM | POA: Diagnosis present

## 2023-02-23 DIAGNOSIS — F4310 Post-traumatic stress disorder, unspecified: Secondary | ICD-10-CM | POA: Diagnosis present

## 2023-02-23 DIAGNOSIS — R9431 Abnormal electrocardiogram [ECG] [EKG]: Secondary | ICD-10-CM | POA: Diagnosis present

## 2023-02-23 DIAGNOSIS — Z79899 Other long term (current) drug therapy: Secondary | ICD-10-CM

## 2023-02-23 DIAGNOSIS — G4733 Obstructive sleep apnea (adult) (pediatric): Secondary | ICD-10-CM | POA: Diagnosis present

## 2023-02-23 DIAGNOSIS — I2510 Atherosclerotic heart disease of native coronary artery without angina pectoris: Secondary | ICD-10-CM | POA: Diagnosis not present

## 2023-02-23 DIAGNOSIS — R1084 Generalized abdominal pain: Principal | ICD-10-CM

## 2023-02-23 DIAGNOSIS — Z8711 Personal history of peptic ulcer disease: Secondary | ICD-10-CM | POA: Diagnosis not present

## 2023-02-23 DIAGNOSIS — G4700 Insomnia, unspecified: Secondary | ICD-10-CM | POA: Diagnosis present

## 2023-02-23 DIAGNOSIS — Z8249 Family history of ischemic heart disease and other diseases of the circulatory system: Secondary | ICD-10-CM

## 2023-02-23 DIAGNOSIS — N135 Crossing vessel and stricture of ureter without hydronephrosis: Secondary | ICD-10-CM | POA: Diagnosis present

## 2023-02-23 DIAGNOSIS — N179 Acute kidney failure, unspecified: Secondary | ICD-10-CM | POA: Diagnosis not present

## 2023-02-23 DIAGNOSIS — M1990 Unspecified osteoarthritis, unspecified site: Secondary | ICD-10-CM | POA: Diagnosis present

## 2023-02-23 DIAGNOSIS — Z823 Family history of stroke: Secondary | ICD-10-CM | POA: Diagnosis not present

## 2023-02-23 DIAGNOSIS — Z8673 Personal history of transient ischemic attack (TIA), and cerebral infarction without residual deficits: Secondary | ICD-10-CM

## 2023-02-23 DIAGNOSIS — E785 Hyperlipidemia, unspecified: Secondary | ICD-10-CM | POA: Diagnosis present

## 2023-02-23 DIAGNOSIS — N201 Calculus of ureter: Secondary | ICD-10-CM

## 2023-02-23 DIAGNOSIS — N131 Hydronephrosis with ureteral stricture, not elsewhere classified: Principal | ICD-10-CM | POA: Diagnosis present

## 2023-02-23 DIAGNOSIS — K5909 Other constipation: Secondary | ICD-10-CM | POA: Diagnosis present

## 2023-02-23 DIAGNOSIS — N1330 Unspecified hydronephrosis: Secondary | ICD-10-CM

## 2023-02-23 DIAGNOSIS — R112 Nausea with vomiting, unspecified: Secondary | ICD-10-CM

## 2023-02-23 LAB — COMPREHENSIVE METABOLIC PANEL
ALT: 36 U/L (ref 0–44)
AST: 60 U/L — ABNORMAL HIGH (ref 15–41)
Albumin: 3.3 g/dL — ABNORMAL LOW (ref 3.5–5.0)
Alkaline Phosphatase: 74 U/L (ref 38–126)
Anion gap: 12 (ref 5–15)
BUN: 21 mg/dL (ref 8–23)
CO2: 23 mmol/L (ref 22–32)
Calcium: 9.5 mg/dL (ref 8.9–10.3)
Chloride: 97 mmol/L — ABNORMAL LOW (ref 98–111)
Creatinine, Ser: 1.52 mg/dL — ABNORMAL HIGH (ref 0.61–1.24)
GFR, Estimated: 48 mL/min — ABNORMAL LOW (ref >60)
Glucose, Bld: 187 mg/dL — ABNORMAL HIGH (ref 70–99)
Potassium: 4.8 mmol/L (ref 3.5–5.1)
Sodium: 132 mmol/L — ABNORMAL LOW (ref 135–145)
Total Bilirubin: 1.7 mg/dL — ABNORMAL HIGH (ref 0.3–1.2)
Total Protein: 8 g/dL (ref 6.5–8.1)

## 2023-02-23 LAB — URINALYSIS, ROUTINE W REFLEX MICROSCOPIC
Bacteria, UA: NONE SEEN
Bilirubin Urine: NEGATIVE
Glucose, UA: NEGATIVE mg/dL
Ketones, ur: NEGATIVE mg/dL
Leukocytes,Ua: NEGATIVE
Nitrite: NEGATIVE
Protein, ur: 100 mg/dL — AB
Specific Gravity, Urine: 1.036 — ABNORMAL HIGH (ref 1.005–1.030)
pH: 6 (ref 5.0–8.0)

## 2023-02-23 LAB — CBC
HCT: 44.2 % (ref 39.0–52.0)
Hemoglobin: 15.2 g/dL (ref 13.0–17.0)
MCH: 35.3 pg — ABNORMAL HIGH (ref 26.0–34.0)
MCHC: 34.4 g/dL (ref 30.0–36.0)
MCV: 102.8 fL — ABNORMAL HIGH (ref 80.0–100.0)
Platelets: 288 10*3/uL (ref 150–400)
RBC: 4.3 MIL/uL (ref 4.22–5.81)
RDW: 13.8 % (ref 11.5–15.5)
WBC: 9.4 10*3/uL (ref 4.0–10.5)
nRBC: 0 % (ref 0.0–0.2)

## 2023-02-23 LAB — TROPONIN I (HIGH SENSITIVITY): Troponin I (High Sensitivity): 8 ng/L (ref <18)

## 2023-02-23 LAB — LIPASE, BLOOD: Lipase: 35 U/L (ref 11–51)

## 2023-02-23 MED ORDER — HYDRALAZINE HCL 25 MG PO TABS: 25.0000 mg | ORAL_TABLET | Freq: Four times a day (QID) | ORAL | Status: DC | PRN

## 2023-02-23 MED ORDER — HYDROMORPHONE HCL 1 MG/ML IJ SOLN: 0.5000 mg | Freq: Once | INTRAMUSCULAR | Status: DC

## 2023-02-23 MED ORDER — ACETAMINOPHEN 325 MG PO TABS: 650.0000 mg | ORAL_TABLET | Freq: Four times a day (QID) | ORAL | Status: DC | PRN

## 2023-02-23 MED ORDER — ROSUVASTATIN CALCIUM 20 MG PO TABS
20.0000 mg | ORAL_TABLET | Freq: Every day | ORAL | Status: DC
  Administered 2023-02-24 – 2023-02-25 (×2): 20 mg via ORAL
  Filled 2023-02-23 (×2): qty 1

## 2023-02-23 MED ORDER — ASPIRIN 81 MG PO TBEC
81.0000 mg | DELAYED_RELEASE_TABLET | Freq: Every day | ORAL | Status: DC
  Administered 2023-02-24 – 2023-02-25 (×2): 81 mg via ORAL
  Filled 2023-02-23 (×2): qty 1

## 2023-02-23 MED ORDER — FENTANYL CITRATE PF 50 MCG/ML IJ SOSY: 25.0000 ug | PREFILLED_SYRINGE | INTRAMUSCULAR | Status: DC | PRN

## 2023-02-23 MED ORDER — SODIUM CHLORIDE 0.9 % IV SOLN: INTRAVENOUS | Status: AC

## 2023-02-23 MED ORDER — PROCHLORPERAZINE EDISYLATE 10 MG/2ML IJ SOLN: 5.0000 mg | INTRAMUSCULAR | Status: DC | PRN

## 2023-02-23 MED ORDER — LACTATED RINGERS IV BOLUS
1000.0000 mL | Freq: Once | INTRAVENOUS | Status: AC
  Administered 2023-02-23: 1000 mL via INTRAVENOUS

## 2023-02-23 MED ORDER — INSULIN ASPART 100 UNIT/ML IJ SOLN: 0.0000 [IU] | INTRAMUSCULAR | Status: DC

## 2023-02-23 MED ORDER — HEPARIN SODIUM (PORCINE) 5000 UNIT/ML IJ SOLN
5000.0000 [IU] | Freq: Three times a day (TID) | INTRAMUSCULAR | Status: DC
  Administered 2023-02-24 – 2023-02-25 (×4): 5000 [IU] via SUBCUTANEOUS
  Filled 2023-02-23 (×4): qty 1

## 2023-02-23 MED ORDER — ONDANSETRON HCL 4 MG/2ML IJ SOLN: 4.0000 mg | Freq: Once | INTRAMUSCULAR | Status: DC

## 2023-02-23 MED ORDER — FLUOXETINE HCL 20 MG PO CAPS
20.0000 mg | ORAL_CAPSULE | Freq: Every day | ORAL | Status: DC
  Administered 2023-02-24 – 2023-02-25 (×2): 20 mg via ORAL
  Filled 2023-02-23 (×2): qty 1

## 2023-02-23 MED ORDER — ACETAMINOPHEN 650 MG RE SUPP: 650.0000 mg | Freq: Four times a day (QID) | RECTAL | Status: DC | PRN

## 2023-02-23 MED ORDER — METOPROLOL SUCCINATE ER 25 MG PO TB24
12.5000 mg | ORAL_TABLET | Freq: Every morning | ORAL | Status: DC
  Administered 2023-02-24 – 2023-02-25 (×2): 12.5 mg via ORAL
  Filled 2023-02-23 (×2): qty 1

## 2023-02-23 MED ORDER — IOHEXOL 350 MG/ML SOLN
60.0000 mL | Freq: Once | INTRAVENOUS | Status: AC | PRN
  Administered 2023-02-23: 60 mL via INTRAVENOUS

## 2023-02-23 NOTE — ED Triage Notes (Signed)
Pt arrives via [facility] from home for lower abdominal pain with N/V. Pt reports three episodes of vomiting. Pt reports last BM one week ago.

## 2023-02-23 NOTE — H&P (Signed)
History and Physical    DESHONE FRIEDER ZOX:[id number] DOB: 1946-01-23 DOA: 02/23/2023  PCP: [facility]   Patient coming from: Home   Chief Complaint: Flank pain, abdominal pain, N/V, loss of appetite  HPI: Derek [name] is a 77 y.o. male with medical history significant for type 2 diabetes mellitus, OSA no longer using CPAP, CAD status post CABG, TIA, dementia, and recent admission for AKI who presents to the emergency department with flank pain, lower abdominal pain, nausea, vomiting, and loss of appetite.  He is accompanied by his wife who assists with the history.  Patient returned from the hospital on 02/22/2023 and improved and stable condition but developed left flank pain overnight which kept him from sleeping.  Today, he was reporting lower abdominal pain and appeared to be in severe discomfort.  He had nausea with 3 episodes of nonbloody vomiting associated with this.  He denied dysuria, fever, or chills.  ED Course: Upon arrival to the ED, patient is found to be afebrile and saturating well on room air with stable blood pressure.  EKG demonstrates sinus rhythm with QTc 530 ms.  CT of the abdomen and pelvis is notable for 9 mm calculus in the proximal left ureter with severe left hydronephrosis and perinephric stranding.  Labs are most notable for creatinine 1.52.  Urology (Dr. Delanna Ahmadi) was consulted by the ED physician and the patient was treated with 1 L of LR and Dilaudid.  Review of Systems:  All other systems reviewed and apart from HPI, are negative.  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   H/O: upper GI bleed 01/2001    related to nonsteroidal use, CLO test +(Med Tx- Prilosec, Amoxicillin, & Biaxin)   Hypertension    Insomnia    Mental disorder    PTSD   Peptic ulcer disease    history of gastric ulcer   PTSD (post-traumatic  stress disorder)    [facility]   Sarcoidosis    late 1970s   Scleritis    Sleep apnea    cpap with O2 4L for 4 yrs   Tubular adenoma 2004    Past Surgical History:  Procedure Laterality Date   CARDIAC CATHETERIZATION     CORONARY ARTERY BYPASS GRAFT  12/02/2017   at [facility] Dr Ty Hilts Surgeon   EYE SURGERY  05   growth lft   HIP PINNING,CANNULATED Left 02/12/2022   Procedure: CANNULATED HIP PINNING;  Surgeon: Terance Hart, MD;  Location: [facility];  Service: Orthopedics;  Laterality: Left;   TOTAL HIP ARTHROPLASTY  06/13/2012   TOTAL HIP ARTHROPLASTY  06/13/2012   Procedure: TOTAL HIP ARTHROPLASTY ANTERIOR APPROACH;  Surgeon: Velna Ochs, MD;  Location: [facility] OR;  Service: Orthopedics;  Laterality: Right;  right anterior approach total hip arthroplasty    Social History:   reports that he has never smoked. He has never used smokeless tobacco. He reports current alcohol use. He reports that he does not use drugs.  No Known Allergies  Family History  Problem Relation Age of Onset   Stroke Mother    Hypertension Mother    Hypertension Father    Hypertension Brother    Gout Brother    Deep vein thrombosis Brother      Prior to Admission medications   Medication Sig Start Date End  Date Taking? Authorizing Provider  allopurinol (ZYLOPRIM) 300 MG tablet Take 300 mg by mouth daily.    [provider]  aspirin EC 81 MG tablet Take 81 mg by mouth daily. Swallow whole.    [provider]  cholecalciferol (VITAMIN D3) 25 MCG (1000 UNIT) tablet Take 1,000 Units by mouth daily.    [provider]  feeding supplement (BOOST HIGH PROTEIN) LIQD Take 1 Container by mouth daily.    [provider]  FLUoxetine (PROZAC) 20 MG capsule Take 20 mg by mouth daily.    [provider]  furosemide (LASIX) 20 MG tablet Take 0.5 tablets (10 mg total) by mouth daily as needed for fluid or edema. Patient taking differently: Take 20 mg by mouth daily. 03/05/22    Leatha Gilding, MD  metFORMIN (GLUCOPHAGE-XR) 500 MG 24 hr tablet Take 1,000 mg by mouth daily with breakfast. 08/03/21   [provider]  methotrexate (RHEUMATREX) 2.5 MG tablet Take 10 mg by mouth 2 (two) times a week. Friday & Saturday    [provider]  metoprolol succinate (TOPROL-XL) 25 MG 24 hr tablet Take 12.5 mg by mouth every morning. 02/08/20   [provider]  rosuvastatin (CRESTOR) 20 MG tablet Take 20 mg by mouth daily. 12/23/21   [provider]    Physical Exam: Vitals:   02/23/23 2215 02/23/23 2227 02/23/23 2230 02/23/23 2250  BP: 134/72  (!) 150/91 (!) 154/84  Pulse: 81  88 81  Resp: 19  (!) 21 (!) 23  Temp:  99 F (37.2 C)    TempSrc:  Oral    SpO2: 96%  97% 93%    Constitutional: NAD, no pallor or diaphoresis   Eyes: PERTLA, lids and conjunctivae normal ENMT: Mucous membranes are moist. Posterior pharynx clear of any exudate or lesions.   Neck: supple, no masses  Respiratory: no wheezing, no crackles. No accessory muscle use.  Cardiovascular: S1 & S2 heard, regular rate and rhythm. No extremity edema.   Abdomen: Soft, no distension, no rebound pain or guarding. Bowel sounds active.  Musculoskeletal: no clubbing / cyanosis. No joint deformity upper and lower extremities.   Skin: no significant rashes, lesions, ulcers. Warm, dry, well-perfused. Neurologic: CN 2-12 grossly intact. Moving all extremities. Alert and oriented to person and place.  Psychiatric: Calm. Cooperative.    Labs and Imaging on Admission: I have personally reviewed following labs and imaging studies  CBC: Recent Labs  Lab 02/21/23 1850 02/22/23 0048 02/23/23 1642  WBC 8.9 9.9 9.4  HGB 14.7 14.2 15.2  HCT 43.5 41.7 44.2  MCV 103.3* 101.7* 102.8*  PLT 292 247 288   Basic Metabolic Panel: Recent Labs  Lab 02/21/23 1850 02/22/23 0048 02/23/23 1642  NA 131* 132* 132*  K 3.9 4.1 4.8  CL 98 101 97*  CO2 21* 21* 23  GLUCOSE 162* 155* 187*  BUN  18 17 21   CREATININE 1.64* 1.49* 1.52*  CALCIUM 9.2 9.1 9.5   GFR: Estimated Creatinine Clearance: 41.7 mL/min (A) (by C-G formula based on SCr of 1.52 mg/dL (H)). Liver Function Tests: Recent Labs  Lab 02/21/23 1850 02/23/23 1642  AST 46* 60*  ALT 30 36  ALKPHOS 66 74  BILITOT 0.9 1.7*  PROT 8.1 8.0  ALBUMIN 3.5 3.3*   Recent Labs  Lab 02/21/23 2325 02/23/23 1642  LIPASE 32 35   No results for input(s): "AMMONIA" in the last 168 hours. Coagulation Profile: No results for input(s): "INR", "PROTIME" in the  last 168 hours. Cardiac Enzymes: No results for input(s): "CKTOTAL", "CKMB", "CKMBINDEX", "TROPONINI" in the last 168 hours. BNP (last 3 results) No results for input(s): "PROBNP" in the last 8760 hours. HbA1C: No results for input(s): "HGBA1C" in the last 72 hours. CBG: Recent Labs  Lab 02/21/23 1901  GLUCAP 152*   Lipid Profile: No results for input(s): "CHOL", "HDL", "LDLCALC", "TRIG", "CHOLHDL", "LDLDIRECT" in the last 72 hours. Thyroid Function Tests: Recent Labs    02/21/23 1850  TSH 1.619   Anemia Panel: No results for input(s): "VITAMINB12", "FOLATE", "FERRITIN", "TIBC", "IRON", "RETICCTPCT" in the last 72 hours. Urine analysis:    Component Value Date/Time   COLORURINE YELLOW 02/23/2023 2235   APPEARANCEUR CLEAR 02/23/2023 2235   LABSPEC 1.036 (H) 02/23/2023 2235   PHURINE 6.0 02/23/2023 2235   GLUCOSEU NEGATIVE 02/23/2023 2235   HGBUR SMALL (A) 02/23/2023 2235   BILIRUBINUR NEGATIVE 02/23/2023 2235   KETONESUR NEGATIVE 02/23/2023 2235   PROTEINUR 100 (A) 02/23/2023 2235   UROBILINOGEN 1.0 06/06/2012 1037   NITRITE NEGATIVE 02/23/2023 2235   LEUKOCYTESUR NEGATIVE 02/23/2023 2235   Sepsis Labs: @LABRCNTIP (procalcitonin:4,lacticidven:4) )No results found for this or any previous visit (from the past 240 hour(s)).   Radiological Exams on Admission: CT ABDOMEN PELVIS W CONTRAST  Result Date: 02/23/2023 CLINICAL DATA:  Abdominal pain EXAM:  CT ABDOMEN AND PELVIS WITH CONTRAST TECHNIQUE: Multidetector CT imaging of the abdomen and pelvis was performed using the standard protocol following bolus administration of intravenous contrast. RADIATION DOSE REDUCTION: This exam was performed according to the departmental dose-optimization program which includes automated exposure control, adjustment of the mA and/or kV according to patient size and/or use of iterative reconstruction technique. CONTRAST:  60mL OMNIPAQUE IOHEXOL 350 MG/ML SOLN COMPARISON:  09/19/2008 FINDINGS: Lower chest: Small linear patchy infiltrates are seen in lower lung fields. Subpleural blebs are seen in the anterior lower lung fields, more so on the right side. Coronary artery calcifications are seen. Metallic sutures are seen in sternum, possibly suggesting previous coronary bypass surgery. Hepatobiliary: Numerous small gallbladder stones are seen. There is no dilation of bile ducts. Pancreas: No focal abnormalities are seen. Spleen: Unremarkable. Adrenals/Urinary Tract: Adrenals are unremarkable. There is contrast in the pelvocaliceal system and proximal right ureter in the right kidney limiting evaluation for small nonobstructing stone. There is marked left hydronephrosis. There is marked left perinephric stranding. There is a 9 mm calculus in the proximal left ureter slightly below the level of ureteropelvic junction. There is a fluid around the left renal pelvis and proximal left ureter. There is 16 mm calculus in the upper pole of left kidney. Distal ureters are not dilated. Evaluation of urinary bladder is limited by beam hardening artifacts. Stomach/Bowel: Small hiatal hernia is seen. Stomach is not distended. Small bowel loops are not dilated. The appendix is not dilated. Moderate to large amount of stool is seen in ascending and transverse colon. Small amount of stool is seen in the rectosigmoid. Evaluation rectosigmoid is limited by artifacts. Vascular/Lymphatic: Coarse  calcifications are seen in aorta and its major branches. Reproductive: Prostate appears to be enlarged in size. Possible calcifications are seen in seminal vesicles. Beam hardening artifacts limit evaluation of pelvic structures. Other: There is no ascites or pneumoperitoneum. Musculoskeletal: There is previous right hip arthroplasty. There is previous internal fixation in left femur with 3 surgical screws in the head and neck. Degenerative changes are noted in lower lumbar spine. Last lumbar vertebra is transitional. IMPRESSION: There is a 9 mm calculus in the  proximal left ureter causing high-grade obstruction with severe left hydronephrosis and marked left perinephric stranding. Perinephric stranding may be due to high-grade ureteric obstruction or superimposed pyelonephritis. There is 16 mm calculus in the upper pole of left kidney. Small linear patchy infiltrates in the lower lung fields may suggest scarring or subsegmental atelectasis. There is no evidence of intestinal obstruction or pneumoperitoneum. Small hiatal hernia. Gallbladder stones. Arteriosclerosis. Coronary artery disease. Lumbar spondylosis. Other findings as described in the body of the report. Electronically Signed   By: Ernie Avena M.D.   On: 02/23/2023 20:56   DG Abd 1 View  Result Date: 02/22/2023 CLINICAL DATA:  Nausea and vomiting EXAM: ABDOMEN - 1 VIEW COMPARISON:  None Available. FINDINGS: Scattered large and small bowel gas is noted. No obstructive changes are seen. Postsurgical changes are noted in the hips bilaterally. No acute bony abnormality is seen. IMPRESSION: No acute abnormality noted. Electronically Signed   By: Alcide Clever M.D.   On: 02/22/2023 00:41    EKG: Independently reviewed. Sinus rhythm, QTc 538 ms.   Assessment/Plan   1. Left ureteral obstruction  - Continue pain-control, check UA, follow-up urology recommendations   2. AKI  - SCr is 1.52 on admission, up from apparent baseline of ~1 - He has  unilateral ureteral obstruction, poor appetite for weeks, and recent N/V - Renally-dose medications, continue IVF hydration, repeat chem panel in am   3. CAD  - No anginal complaints  - Continue ASA, Crestor, and metoprolol   4. Type II DM  - A1c was 5.4% in May 2023  - Hold metformin, check CBGs, and use low-intensity SSI for now    5. Dementia  - Use delirium precautions     6. Prolonged QT interval  - Check magnesium level, avoid QT-prolonging medications    DVT prophylaxis: sq heparin  Code Status: Full  Level of Care: Level of care: Med-Surg Family Communication: Wife updated at bedside  Disposition Plan:  Patient is from: home  Anticipated d/c is to: Home  Anticipated d/c date is: 5/17 or 02/26/23  Patient currently: Pending urology consultation, UA, pain-control  Consults called: Urology  Admission status: Inpatient     Briscoe Deutscher, MD [facility]  02/23/2023, 11:23 PM

## 2023-02-23 NOTE — ED Notes (Signed)
Patient hygiene completed.  Patient incontinent of urine.  Was able to provide urine sample using urinal.  External catheter placed per wife's request

## 2023-02-23 NOTE — Discharge Instructions (Addendum)
It was our pleasure to provide your ER care today - we hope that you feel better.  Your ct scan was read as showing: There is a 9 mm calculus in the proximal left ureter causing high-grade obstruction with severe left hydronephrosis and marked left perinephric stranding. Incidental note was also made of gallstones and constipation.   Regarding the kidney stone, follow up closely with urologist in the next few days - call office tomorrow AM to arrange appointment.  Drink plenty of fluids/stay well hydrated. Strain urine. Take acetaminophen as need for pain. You may also take ultram as need for pain - no driving when taking.   Also follow up closely with primary care doctor in the coming week regarding the other issues. Have your blood pressure rechecked then as it is high today.  Return to ER right away if worse, new symptoms, fevers, new/severe or intractable pain, weak/faint, chest pain, trouble breathing, or other concern.

## 2023-02-23 NOTE — ED Notes (Signed)
ED TO INPATIENT HANDOFF REPORT  ED Nurse Name and Phone #: Marquetta Weiskopf [phone]  S Name/Age/Gender Derek [name] 77 y.o. male Room/Bed: 031C/031C  Code Status   Code Status: Prior  Home/SNF/Other Home Patient oriented to: self Is this baseline? Yes   Triage Complete: Triage complete  Chief Complaint Ureteral obstruction, left [N13.5]  Triage Note Pt arrives via [facility] from home for lower abdominal pain with N/V. Pt reports three episodes of vomiting. Pt reports last BM one week ago.    Allergies No Known Allergies  Level of Care/Admitting Diagnosis ED Disposition     ED Disposition  Admit   Condition  --   Comment  Hospital Area: [facility] [[id number]]  Level of Care: Med-Surg [16]  May admit patient to [facility] or [facility] if equivalent level of care is available:: No  Covid Evaluation: Asymptomatic - no recent exposure (last 10 days) testing not required  Diagnosis: Ureteral obstruction, left [454098]  Admitting Physician: Briscoe Deutscher [[id number]]  Attending Physician: Briscoe Deutscher [[id number]]  Certification:: I certify this patient will need inpatient services for at least 2 midnights  Estimated Length of Stay: 3          B Medical/Surgery History 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   H/O: upper GI bleed 01/2001    related to nonsteroidal use, CLO test +(Med Tx- Prilosec, Amoxicillin, & Biaxin)   Hypertension    Insomnia    Mental disorder    PTSD   Peptic ulcer disease    history of gastric ulcer   PTSD (post-traumatic stress disorder)    [facility]   Sarcoidosis    late 1970s   Scleritis    Sleep apnea    cpap with O2 4L for 4 yrs   Tubular adenoma 2004   Past Surgical History:  Procedure Laterality Date   CARDIAC CATHETERIZATION     CORONARY ARTERY BYPASS GRAFT  12/02/2017   at [facility] Dr Ty Hilts  Surgeon   EYE SURGERY  05   growth lft   HIP PINNING,CANNULATED Left 02/12/2022   Procedure: CANNULATED HIP PINNING;  Surgeon: Terance Hart, MD;  Location: [facility];  Service: Orthopedics;  Laterality: Left;   TOTAL HIP ARTHROPLASTY  06/13/2012   TOTAL HIP ARTHROPLASTY  06/13/2012   Procedure: TOTAL HIP ARTHROPLASTY ANTERIOR APPROACH;  Surgeon: Velna Ochs, MD;  Location: [facility] OR;  Service: Orthopedics;  Laterality: Right;  right anterior approach total hip arthroplasty     A IV Location/Drains/Wounds Patient Lines/Drains/Airways Status     Active Line/Drains/Airways     Name Placement date Placement time Site Days   Peripheral IV 02/23/23 22 G Posterior;Right Hand 02/23/23  2254  Hand  less than 1   External Urinary Catheter 02/23/23  2242  --  less than 1   Pressure Injury 02/25/22 Buttocks Left;Mid Stage 2 -  Partial thickness loss of dermis presenting as a shallow open injury with a red, pink wound bed without slough. Small, shallow, open, red area on left buttocks towards midline 02/25/22  0541  -- 363   Pressure Injury 02/28/22 Thigh Posterior;Proximal;Right;Lower Stage 2 -  Partial thickness loss of dermis presenting as a shallow open injury with a red, pink wound bed without slough. 02/28/22  1400  -- 360   Pressure  Injury 03/04/22 Sacrum Mid Stage 2 -  Partial thickness loss of dermis presenting as a shallow open injury with a red, pink wound bed without slough. 03/04/22  0006  -- 356            Intake/Output Last 24 hours No intake or output data in the 24 hours ending 02/23/23 2255  Labs/Imaging Results for orders placed or performed during the hospital encounter of 02/23/23 (from the past 48 hour(s))  Comprehensive metabolic panel     Status: Abnormal   Collection Time: 02/23/23  4:42 PM  Result Value Ref Range   Sodium 132 (L) 135 - 145 mmol/L   Potassium 4.8 3.5 - 5.1 mmol/L   Chloride 97 (L) 98 - 111 mmol/L   CO2 23 22 - 32 mmol/L   Glucose, Bld 187 (H) 70  - 99 mg/dL    Comment: Glucose reference range applies only to samples taken after fasting for at least 8 hours.   BUN 21 8 - 23 mg/dL   Creatinine, Ser 1.61 (H) 0.61 - 1.24 mg/dL    Comment: DELTA CHECK NOTED   Calcium 9.5 8.9 - 10.3 mg/dL   Total Protein 8.0 6.5 - 8.1 g/dL   Albumin 3.3 (L) 3.5 - 5.0 g/dL   AST 60 (H) 15 - 41 U/L   ALT 36 0 - 44 U/L   Alkaline Phosphatase 74 38 - 126 U/L   Total Bilirubin 1.7 (H) 0.3 - 1.2 mg/dL   GFR, Estimated 48 (L) >60 mL/min    Comment: (NOTE) Calculated using the CKD-EPI Creatinine Equation (2021)    Anion gap 12 5 - 15    Comment: Performed at [facility], [address], [place], [place] [address]  CBC     Status: Abnormal   Collection Time: 02/23/23  4:42 PM  Result Value Ref Range   WBC 9.4 4.0 - 10.5 K/uL   RBC 4.30 4.22 - 5.81 MIL/uL   Hemoglobin 15.2 13.0 - 17.0 g/dL   HCT 54.0 98.1 - 19.1 %   MCV 102.8 (H) 80.0 - 100.0 fL   MCH 35.3 (H) 26.0 - 34.0 pg   MCHC 34.4 30.0 - 36.0 g/dL   RDW 47.8 29.5 - 62.1 %   Platelets 288 150 - 400 K/uL   nRBC 0.0 0.0 - 0.2 %    Comment: Performed at [facility], [address] [place], [place] [address]  Lipase, blood     Status: None   Collection Time: 02/23/23  4:42 PM  Result Value Ref Range   Lipase 35 11 - 51 U/L    Comment: HEMOLYSIS AT THIS LEVEL MAY AFFECT RESULT Performed at [facility], [address] [place], [place] [address]   Troponin I (High Sensitivity)     Status: None   Collection Time: 02/23/23  4:42 PM  Result Value Ref Range   Troponin I (High Sensitivity) 8 <18 ng/L    Comment: (NOTE) Elevated high sensitivity troponin I (hsTnI) values and significant  changes across serial measurements may suggest ACS but many other  chronic and acute conditions are known to elevate hsTnI results.  Refer to the "Links" section for chest pain algorithms and additional  guidance. Performed at [facility], [address], [place], [place] [address]     CT ABDOMEN PELVIS W CONTRAST  Result Date: 02/23/2023 CLINICAL DATA:  Abdominal pain EXAM: CT ABDOMEN AND PELVIS WITH CONTRAST TECHNIQUE: Multidetector CT imaging of  the abdomen and pelvis was performed using the standard protocol following bolus administration of intravenous contrast. RADIATION DOSE REDUCTION: This exam was performed according to the departmental dose-optimization program which includes automated exposure control, adjustment of the mA and/or kV according to patient size and/or use of iterative reconstruction technique. CONTRAST:  60mL OMNIPAQUE IOHEXOL 350 MG/ML SOLN COMPARISON:  09/19/2008 FINDINGS: Lower chest: Small linear patchy infiltrates are seen in lower lung fields. Subpleural blebs are seen in the anterior lower lung fields, more so on the right side. Coronary artery calcifications are seen. Metallic sutures are seen in sternum, possibly suggesting previous coronary bypass surgery. Hepatobiliary: Numerous small gallbladder stones are seen. There is no dilation of bile ducts. Pancreas: No focal abnormalities are seen. Spleen: Unremarkable. Adrenals/Urinary Tract: Adrenals are unremarkable. There is contrast in the pelvocaliceal system and proximal right ureter in the right kidney limiting evaluation for small nonobstructing stone. There is marked left hydronephrosis. There is marked left perinephric stranding. There is a 9 mm calculus in the proximal left ureter slightly below the level of ureteropelvic junction. There is a fluid around the left renal pelvis and proximal left ureter. There is 16 mm calculus in the upper pole of left kidney. Distal ureters are not dilated. Evaluation of urinary bladder is limited by beam hardening artifacts. Stomach/Bowel: Small hiatal hernia is seen. Stomach is not distended. Small bowel loops are not dilated. The appendix is not dilated. Moderate to large amount of stool is seen in ascending and transverse colon. Small amount of stool is seen in the  rectosigmoid. Evaluation rectosigmoid is limited by artifacts. Vascular/Lymphatic: Coarse calcifications are seen in aorta and its major branches. Reproductive: Prostate appears to be enlarged in size. Possible calcifications are seen in seminal vesicles. Beam hardening artifacts limit evaluation of pelvic structures. Other: There is no ascites or pneumoperitoneum. Musculoskeletal: There is previous right hip arthroplasty. There is previous internal fixation in left femur with 3 surgical screws in the head and neck. Degenerative changes are noted in lower lumbar spine. Last lumbar vertebra is transitional. IMPRESSION: There is a 9 mm calculus in the proximal left ureter causing high-grade obstruction with severe left hydronephrosis and marked left perinephric stranding. Perinephric stranding may be due to high-grade ureteric obstruction or superimposed pyelonephritis. There is 16 mm calculus in the upper pole of left kidney. Small linear patchy infiltrates in the lower lung fields may suggest scarring or subsegmental atelectasis. There is no evidence of intestinal obstruction or pneumoperitoneum. Small hiatal hernia. Gallbladder stones. Arteriosclerosis. Coronary artery disease. Lumbar spondylosis. Other findings as described in the body of the report. Electronically Signed   By: Ernie Avena M.D.   On: 02/23/2023 20:56   DG Abd 1 View  Result Date: 02/22/2023 CLINICAL DATA:  Nausea and vomiting EXAM: ABDOMEN - 1 VIEW COMPARISON:  None Available. FINDINGS: Scattered large and small bowel gas is noted. No obstructive changes are seen. Postsurgical changes are noted in the hips bilaterally. No acute bony abnormality is seen. IMPRESSION: No acute abnormality noted. Electronically Signed   By: Alcide Clever M.D.   On: 02/22/2023 00:41   DG Chest 2 View  Result Date: 02/21/2023 CLINICAL DATA:  Dyspnea. EXAM: CHEST - 2 VIEW COMPARISON:  Chest radiographs 02/12/2023 and 02/25/2022 and 02/17/2022 FINDINGS:  Status post median sternotomy. Cardiac silhouette is at the upper limits of normal size. Moderate atherosclerotic calcifications within the aortic arch. Mildly decreased lung volumes. Mild bibasilar subsegmental atelectasis. No pleural effusion pneumothorax. Mild multilevel degenerative disc changes of  the thoracic spine. IMPRESSION: Mildly decreased lung volumes with mild bibasilar subsegmental atelectasis. Electronically Signed   By: Neita Garnet M.D.   On: 02/21/2023 23:21    Pending Labs Unresulted Labs (From admission, onward)     Start     Ordered   02/23/23 1629  Urinalysis, Routine w reflex microscopic -Urine, Clean Catch  ONCE - STAT,   URGENT       Question:  Specimen Source  Answer:  Urine, Clean Catch   02/23/23 1628            Vitals/Pain Today's Vitals   02/23/23 2215 02/23/23 2227 02/23/23 2230 02/23/23 2250  BP: 134/72  (!) 150/91 (!) 154/84  Pulse: 81  88 81  Resp: 19  (!) 21 (!) 23  Temp:  99 F (37.2 C)    TempSrc:  Oral    SpO2: 96%  97% 93%  PainSc:        Isolation Precautions No active isolations  Medications Medications  HYDROmorphone (DILAUDID) injection 0.5 mg (0 mg Intravenous Hold 02/23/23 2253)  ondansetron (ZOFRAN) injection 4 mg (0 mg Intravenous Hold 02/23/23 2253)  lactated ringers bolus 1,000 mL (1,000 mLs Intravenous New Bag/Given 02/23/23 1931)  iohexol (OMNIPAQUE) 350 MG/ML injection 60 mL (60 mLs Intravenous Contrast Given 02/23/23 2039)    Mobility walks with device     Focused Assessments Patient at baseline mental status per wife.  Patient alert and oriented to self.  Will follow commands.   R Recommendations: See Admitting Provider Note  Report given to:   Additional Notes: Patient has no current complaints.  No pain at this time.  Wife is primary care giver at home and states he had "a bad night last night."  Was concerned for abdominal pain and constipation.  Wife notes that patient will not let anyone know if he is in pain  unless it is severe.  Patient and wife report that he is comfortable at this time.

## 2023-02-23 NOTE — ED Provider Notes (Addendum)
[place] EMERGENCY DEPARTMENT AT [facility] Provider Note   CSN: [id number] Arrival date & time: 02/23/23  1616     History  Chief Complaint  Patient presents with   Abdominal Pain    Kainon L Dewar is a 77 y.o. male.  Pt with c/o nausea in past several days with decreased appetite, decreased po intake. Indicates vomiting a couple times today, not bloody or bilious. C/o mid to lower abd pain, dull, mild-mod, non radiating. No abd distension. Normal bm yesterday. Denies scrotal or testicular pain or swelling. No dysuria. Is urinating normal amount. Denies back/flank pain. Denies chest pain or discomfort. No sob. No cough or uri symptoms. No fever or chills.   The history is provided by the patient, medical records and the EMS personnel. The history is limited by the condition of the patient.  Abdominal Pain Associated symptoms: nausea and vomiting   Associated symptoms: no chest pain, no chills, no constipation, no cough, no diarrhea, no dysuria, no fever, no shortness of breath and no sore throat        Home Medications Prior to Admission medications   Medication Sig Start Date End Date Taking? Authorizing Provider  allopurinol (ZYLOPRIM) 300 MG tablet Take 300 mg by mouth daily.    [provider]  aspirin EC 81 MG tablet Take 81 mg by mouth daily. Swallow whole.    [provider]  cholecalciferol (VITAMIN D3) 25 MCG (1000 UNIT) tablet Take 1,000 Units by mouth daily.    [provider]  feeding supplement (BOOST HIGH PROTEIN) LIQD Take 1 Container by mouth daily.    [provider]  FLUoxetine (PROZAC) 20 MG capsule Take 20 mg by mouth daily.    [provider]  furosemide (LASIX) 20 MG tablet Take 0.5 tablets (10 mg total) by mouth daily as needed for fluid or edema. Patient taking differently: Take 20 mg by mouth daily. 03/05/22   Leatha Gilding, MD  metFORMIN (GLUCOPHAGE-XR) 500 MG 24 hr tablet Take 1,000 mg  by mouth daily with breakfast. 08/03/21   [provider]  methotrexate (RHEUMATREX) 2.5 MG tablet Take 10 mg by mouth 2 (two) times a week. Friday & Saturday    [provider]  metoprolol succinate (TOPROL-XL) 25 MG 24 hr tablet Take 12.5 mg by mouth every morning. 02/08/20   [provider]  rosuvastatin (CRESTOR) 20 MG tablet Take 20 mg by mouth daily. 12/23/21   [provider]      Allergies    Patient has no known allergies.    Review of Systems   Review of Systems  Constitutional:  Negative for chills and fever.  HENT:  Negative for sore throat.   Eyes:  Negative for redness.  Respiratory:  Negative for cough and shortness of breath.   Cardiovascular:  Negative for chest pain.  Gastrointestinal:  Positive for abdominal pain, nausea and vomiting. Negative for constipation and diarrhea.  Genitourinary:  Negative for dysuria, flank pain, scrotal swelling and testicular pain.  Musculoskeletal:  Negative for back pain and neck pain.  Skin:  Negative for rash.  Neurological:  Negative for headaches.  Hematological:  Does not bruise/bleed easily.    Physical Exam Updated Vital Signs BP (!) 169/83   Pulse 81   Temp 98.2 F (36.8 C) (Oral)   Resp (!) 30   SpO2 100%  Physical Exam Vitals and nursing note reviewed.  Constitutional:      Appearance: Normal appearance. He is  well-developed.  HENT:     Head: Atraumatic.     Nose: Nose normal.     Mouth/Throat:     Mouth: Mucous membranes are moist.     Pharynx: Oropharynx is clear.  Eyes:     General: No scleral icterus.    Conjunctiva/sclera: Conjunctivae normal.     Pupils: Pupils are equal, round, and reactive to light.  Neck:     Trachea: No tracheal deviation.  Cardiovascular:     Rate and Rhythm: Normal rate and regular rhythm.     Pulses: Normal pulses.     Heart sounds: Normal heart sounds. No murmur heard.    No friction rub. No gallop.  Pulmonary:     Effort: Pulmonary effort  is normal. No accessory muscle usage or respiratory distress.     Breath sounds: Normal breath sounds.  Abdominal:     General: Bowel sounds are normal. There is no distension.     Palpations: Abdomen is soft. There is no mass.     Tenderness: There is abdominal tenderness. There is no guarding.     Comments: Mid abd tenderness. Small, soft, non tender, reducible umbilical hernia.   Genitourinary:    Comments: No cva tenderness. Normal external gu exam - no scrotal or testicular pain or tenderness.  Musculoskeletal:        General: No swelling.     Cervical back: Normal range of motion and neck supple. No rigidity.  Skin:    General: Skin is warm and dry.     Findings: No rash.  Neurological:     Mental Status: He is alert.     Comments: Alert, speech clear.   Psychiatric:        Mood and Affect: Mood normal.     ED Results / Procedures / Treatments   Labs (all labs ordered are listed, but only abnormal results are displayed) Results for orders placed or performed during the hospital encounter of 02/23/23  Comprehensive metabolic panel  Result Value Ref Range   Sodium 132 (L) 135 - 145 mmol/L   Potassium 4.8 3.5 - 5.1 mmol/L   Chloride 97 (L) 98 - 111 mmol/L   CO2 23 22 - 32 mmol/L   Glucose, Bld 187 (H) 70 - 99 mg/dL   BUN 21 8 - 23 mg/dL   Creatinine, Ser 1.61 (H) 0.61 - 1.24 mg/dL   Calcium 9.5 8.9 - 09.6 mg/dL   Total Protein 8.0 6.5 - 8.1 g/dL   Albumin 3.3 (L) 3.5 - 5.0 g/dL   AST 60 (H) 15 - 41 U/L   ALT 36 0 - 44 U/L   Alkaline Phosphatase 74 38 - 126 U/L   Total Bilirubin 1.7 (H) 0.3 - 1.2 mg/dL   GFR, Estimated 48 (L) >60 mL/min   Anion gap 12 5 - 15  CBC  Result Value Ref Range   WBC 9.4 4.0 - 10.5 K/uL   RBC 4.30 4.22 - 5.81 MIL/uL   Hemoglobin 15.2 13.0 - 17.0 g/dL   HCT 04.5 40.9 - 81.1 %   MCV 102.8 (H) 80.0 - 100.0 fL   MCH 35.3 (H) 26.0 - 34.0 pg   MCHC 34.4 30.0 - 36.0 g/dL   RDW 91.4 78.2 - 95.6 %   Platelets 288 150 - 400 K/uL   nRBC 0.0 0.0  - 0.2 %  Lipase, blood  Result Value Ref Range   Lipase 35 11 - 51 U/L  Troponin I (High Sensitivity)  Result  Value Ref Range   Troponin I (High Sensitivity) 8 <18 ng/L   CT ABDOMEN PELVIS W CONTRAST  Result Date: 02/23/2023 CLINICAL DATA:  Abdominal pain EXAM: CT ABDOMEN AND PELVIS WITH CONTRAST TECHNIQUE: Multidetector CT imaging of the abdomen and pelvis was performed using the standard protocol following bolus administration of intravenous contrast. RADIATION DOSE REDUCTION: This exam was performed according to the departmental dose-optimization program which includes automated exposure control, adjustment of the mA and/or kV according to patient size and/or use of iterative reconstruction technique. CONTRAST:  60mL OMNIPAQUE IOHEXOL 350 MG/ML SOLN COMPARISON:  09/19/2008 FINDINGS: Lower chest: Small linear patchy infiltrates are seen in lower lung fields. Subpleural blebs are seen in the anterior lower lung fields, more so on the right side. Coronary artery calcifications are seen. Metallic sutures are seen in sternum, possibly suggesting previous coronary bypass surgery. Hepatobiliary: Numerous small gallbladder stones are seen. There is no dilation of bile ducts. Pancreas: No focal abnormalities are seen. Spleen: Unremarkable. Adrenals/Urinary Tract: Adrenals are unremarkable. There is contrast in the pelvocaliceal system and proximal right ureter in the right kidney limiting evaluation for small nonobstructing stone. There is marked left hydronephrosis. There is marked left perinephric stranding. There is a 9 mm calculus in the proximal left ureter slightly below the level of ureteropelvic junction. There is a fluid around the left renal pelvis and proximal left ureter. There is 16 mm calculus in the upper pole of left kidney. Distal ureters are not dilated. Evaluation of urinary bladder is limited by beam hardening artifacts. Stomach/Bowel: Small hiatal hernia is seen. Stomach is not distended.  Small bowel loops are not dilated. The appendix is not dilated. Moderate to large amount of stool is seen in ascending and transverse colon. Small amount of stool is seen in the rectosigmoid. Evaluation rectosigmoid is limited by artifacts. Vascular/Lymphatic: Coarse calcifications are seen in aorta and its major branches. Reproductive: Prostate appears to be enlarged in size. Possible calcifications are seen in seminal vesicles. Beam hardening artifacts limit evaluation of pelvic structures. Other: There is no ascites or pneumoperitoneum. Musculoskeletal: There is previous right hip arthroplasty. There is previous internal fixation in left femur with 3 surgical screws in the head and neck. Degenerative changes are noted in lower lumbar spine. Last lumbar vertebra is transitional. IMPRESSION: There is a 9 mm calculus in the proximal left ureter causing high-grade obstruction with severe left hydronephrosis and marked left perinephric stranding. Perinephric stranding may be due to high-grade ureteric obstruction or superimposed pyelonephritis. There is 16 mm calculus in the upper pole of left kidney. Small linear patchy infiltrates in the lower lung fields may suggest scarring or subsegmental atelectasis. There is no evidence of intestinal obstruction or pneumoperitoneum. Small hiatal hernia. Gallbladder stones. Arteriosclerosis. Coronary artery disease. Lumbar spondylosis. Other findings as described in the body of the report. Electronically Signed   By: Ernie Avena M.D.   On: 02/23/2023 20:56   DG Abd 1 View  Result Date: 02/22/2023 CLINICAL DATA:  Nausea and vomiting EXAM: ABDOMEN - 1 VIEW COMPARISON:  None Available. FINDINGS: Scattered large and small bowel gas is noted. No obstructive changes are seen. Postsurgical changes are noted in the hips bilaterally. No acute bony abnormality is seen. IMPRESSION: No acute abnormality noted. Electronically Signed   By: Alcide Clever M.D.   On: 02/22/2023 00:41    DG Chest 2 View  Result Date: 02/21/2023 CLINICAL DATA:  Dyspnea. EXAM: CHEST - 2 VIEW COMPARISON:  Chest radiographs 02/12/2023 and 02/25/2022 and 02/17/2022  FINDINGS: Status post median sternotomy. Cardiac silhouette is at the upper limits of normal size. Moderate atherosclerotic calcifications within the aortic arch. Mildly decreased lung volumes. Mild bibasilar subsegmental atelectasis. No pleural effusion pneumothorax. Mild multilevel degenerative disc changes of the thoracic spine. IMPRESSION: Mildly decreased lung volumes with mild bibasilar subsegmental atelectasis. Electronically Signed   By: Neita Garnet M.D.   On: 02/21/2023 23:21   ECHOCARDIOGRAM COMPLETE  Result Date: 02/13/2023    ECHOCARDIOGRAM REPORT   Patient Name:   HERRY DASH Date of Exam: 02/13/2023 Medical Rec #:  [id number]           Height:       67.0 in Accession #:    [id number]          Weight:       169.8 lb Date of Birth:  January 06, 1946           BSA:          1.886 m Patient Age:    76 years            BP:           114/63 mmHg Patient Gender: M                   HR:           52 bpm. Exam Location:  Inpatient Procedure: 2D Echo, Color Doppler, Cardiac Doppler and Intracardiac            Opacification Agent Indications:    R55 Syncope  History:        Patient has prior history of Echocardiogram examinations, most                 recent 12/02/2017. Prior CABG; Risk Factors:Hypertension,                 Diabetes, Dyslipidemia and Sleep Apnea.  Sonographer:    Irving Burton Senior RDCS Referring Phys: Jonah Blue  Sonographer Comments: Definity used to assess for apical thrombus IMPRESSIONS  1. Left ventricular ejection fraction, by estimation, is 65 to 70%. The left ventricle has normal function. The left ventricle has no regional wall motion abnormalities. Left ventricular diastolic parameters are consistent with Grade I diastolic dysfunction (impaired relaxation).  2. Right ventricular systolic function is low normal. The right  ventricular size is normal. Tricuspid regurgitation signal is inadequate for assessing PA pressure.  3. The mitral valve is degenerative. Trivial mitral valve regurgitation. Moderate mitral annular calcification.  4. The aortic valve is tricuspid. There is moderate calcification of the aortic valve. There is moderate thickening of the aortic valve. Aortic valve regurgitation is mild. Mild aortic valve stenosis. Aortic valve mean gradient measures 11.0 mmHg. Aortic valve Vmax measures 2.30 m/s.  5. The inferior vena cava is normal in size with greater than 50% respiratory variability, suggesting right atrial pressure of 3 mmHg.  6. PFO present at time of CABG by intraoperative TEE report. Evidence of atrial level shunting detected by color flow Doppler. Comparison(s): Compared to prior echo report in 2019, there is no significant change. FINDINGS  Left Ventricle: Left ventricular ejection fraction, by estimation, is 65 to 70%. The left ventricle has normal function. The left ventricle has no regional wall motion abnormalities. Definity contrast agent was given IV to delineate the left ventricular  endocardial borders. The left ventricular internal cavity size was normal in size. There is borderline left ventricular hypertrophy. Left ventricular diastolic parameters are consistent with Grade I diastolic  dysfunction (impaired relaxation). Right Ventricle: The right ventricular size is normal. No increase in right ventricular wall thickness. Right ventricular systolic function is low normal. Tricuspid regurgitation signal is inadequate for assessing PA pressure. Left Atrium: Left atrial size was normal in size. Right Atrium: Right atrial size was normal in size. Pericardium: Trivial pericardial effusion is present. Presence of epicardial fat layer. Mitral Valve: The mitral valve is degenerative in appearance. There is moderate thickening of the mitral valve leaflet(s). There is moderate calcification of the mitral valve  leaflet(s). Moderate mitral annular calcification. Trivial mitral valve regurgitation. Tricuspid Valve: The tricuspid valve is grossly normal. Tricuspid valve regurgitation is trivial. Aortic Valve: The aortic valve is tricuspid. There is moderate calcification of the aortic valve. There is moderate thickening of the aortic valve. Aortic valve regurgitation is mild. Mild aortic stenosis is present. Aortic valve mean gradient measures 11.0 mmHg. Aortic valve peak gradient measures 21.2 mmHg. Aortic valve area, by VTI measures 1.13 cm. Pulmonic Valve: The pulmonic valve was grossly normal. Pulmonic valve regurgitation is trivial. Aorta: The aortic root and ascending aorta are structurally normal, with no evidence of dilitation. Venous: The inferior vena cava is normal in size with greater than 50% respiratory variability, suggesting right atrial pressure of 3 mmHg. IAS/Shunts: Evidence of atrial level shunting detected by color flow Doppler.  LEFT VENTRICLE PLAX 2D LVIDd:         4.10 cm   Diastology LVIDs:         2.60 cm   LV e' medial:    7.29 cm/s LV PW:         1.00 cm   LV E/e' medial:  10.1 LV IVS:        1.10 cm   LV e' lateral:   8.92 cm/s LVOT diam:     1.80 cm   LV E/e' lateral: 8.3 LV SV:         53 LV SV Index:   28 LVOT Area:     2.54 cm  RIGHT VENTRICLE RV S prime:     11.30 cm/s TAPSE (M-mode): 1.4 cm LEFT ATRIUM             Index        RIGHT ATRIUM           Index LA diam:        3.60 cm 1.91 cm/m   RA Area:     13.70 cm LA Vol (A2C):   32.5 ml 17.23 ml/m  RA Volume:   28.60 ml  15.16 ml/m LA Vol (A4C):   36.5 ml 19.35 ml/m LA Biplane Vol: 37.2 ml 19.72 ml/m  AORTIC VALVE AV Area (Vmax):    1.01 cm AV Area (Vmean):   1.15 cm AV Area (VTI):     1.13 cm AV Vmax:           230.00 cm/s AV Vmean:          140.000 cm/s AV VTI:            0.465 m AV Peak Grad:      21.2 mmHg AV Mean Grad:      11.0 mmHg LVOT Vmax:         91.40 cm/s LVOT Vmean:        63.000 cm/s LVOT VTI:          0.207 m  LVOT/AV VTI ratio: 0.45  AORTA Ao Root diam: 3.00 cm Ao Asc diam:  3.50 cm MITRAL VALVE  MV Area (PHT): 3.34 cm    SHUNTS MV Decel Time: 227 msec    Systemic VTI:  0.21 m MV E velocity: 73.90 cm/s  Systemic Diam: 1.80 cm MV A velocity: 95.70 cm/s MV E/A ratio:  0.77 Laurance Flatten MD Electronically signed by Laurance Flatten MD Signature Date/Time: 02/13/2023/2:19:34 PM    Final    EEG adult  Result Date: 02/12/2023 Charlsie Quest, MD     02/12/2023  6:54 PM Patient Name: RIGSBY PEREGRINO MRN: [id number] Epilepsy Attending: Charlsie Quest Referring Physician/Provider: Jonah Blue, MD Date: 02/12/2023 Duration: 21.27 mins Patient history: 77yo M with syncope getting eeg to evaluate for seizure Level of alertness: Awake AEDs during EEG study: None Technical aspects: This EEG study was done with scalp electrodes positioned according to the 10-20 International system of electrode placement. Electrical activity was reviewed with band pass filter of 1-70Hz , sensitivity of 7 uV/mm, display speed of 36mm/sec with a 60Hz  notched filter applied as appropriate. EEG data were recorded continuously and digitally stored.  Video monitoring was available and reviewed as appropriate. Description: The posterior dominant rhythm consists of 8 Hz activity of moderate voltage (25-35 uV) seen predominantly in posterior head regions, symmetric and reactive to eye opening and eye closing. EEG showed intermittent 2-3hz  Hz theta-delta slowing in left and right temporal region. Physiologic photic driving was not seen during photic stimulation.  Hyperventilation was not performed.   ABNORMALITY - Intermittent slow, left and right temporal region IMPRESSION: This study is suggestive of non specific cortical dysfunction arising from left and right temporal region. No seizures or epileptiform discharges were seen throughout the recording. Charlsie Quest   DG Chest Port 1 View  Result Date: 02/12/2023 CLINICAL DATA:  Syncope. EXAM:  PORTABLE CHEST 1 VIEW COMPARISON:  Chest radiographs dated Feb 25, 2022 FINDINGS: The heart is enlarged with evidence of prior coronary artery bypass grafting. Low lung volumes with left basilar opacity suggesting atelectasis and/or small effusion. Thoracic spondylosis. IMPRESSION: Low lung volumes with left basilar opacity suggesting atelectasis and/or small effusion. Stable cardiomegaly. Electronically Signed   By: Larose Hires D.O.   On: 02/12/2023 11:45   CT Head Wo Contrast  Result Date: 02/12/2023 CLINICAL DATA:  77 year old male with altered mental status. EXAM: CT HEAD WITHOUT CONTRAST TECHNIQUE: Contiguous axial images were obtained from the base of the skull through the vertex without intravenous contrast. RADIATION DOSE REDUCTION: This exam was performed according to the departmental dose-optimization program which includes automated exposure control, adjustment of the mA and/or kV according to patient size and/or use of iterative reconstruction technique. COMPARISON:  None Available. FINDINGS: Brain: No midline shift, ventriculomegaly, mass effect, evidence of mass lesion, intracranial hemorrhage or evidence of cortically based acute infarction. Confluent and widespread bilateral cerebral white matter hypodensity. Small area of chronic appearing cortical encephalomalacia right middle frontal gyrus (series 5, image 26). Deep gray matter nuclei, brainstem and cerebellum relatively spared. Vascular: Calcified atherosclerosis at the skull base. No suspicious intracranial vascular hyperdensity. Skull: No acute osseous abnormality identified. Sinuses/Orbits: Scattered mild paranasal sinus mucosal thickening or small mucous retention cysts. Tympanic cavities and mastoids are clear. Other: Calcified scalp vessel atherosclerosis. No acute orbit or scalp soft tissue finding. IMPRESSION: 1. No acute intracranial abnormality identified. 2. 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 3. Advanced calcified atherosclerosis, including scalp vessel involvement frequently seen with End stage renal disease. Electronically Signed   By: Althea Grimmer.D.  On: 02/12/2023 11:29     EKG EKG Interpretation  Date/Time:  Wednesday Feb 23 2023 16:37:34 EDT Ventricular Rate:  83 PR Interval:  172 QRS Duration: 78 QT Interval:  458 QTC Calculation: 538 R Axis:   242 Text Interpretation: Normal sinus rhythm Right superior axis deviation  Nonspecific T wave abnormality  Confirmed by Cathren Laine ([id number]) on 02/23/2023 5:10:44 PM  Radiology CT ABDOMEN PELVIS W CONTRAST  Result Date: 02/23/2023 CLINICAL DATA:  Abdominal pain EXAM: CT ABDOMEN AND PELVIS WITH CONTRAST TECHNIQUE: Multidetector CT imaging of the abdomen and pelvis was performed using the standard protocol following bolus administration of intravenous contrast. RADIATION DOSE REDUCTION: This exam was performed according to the departmental dose-optimization program which includes automated exposure control, adjustment of the mA and/or kV according to patient size and/or use of iterative reconstruction technique. CONTRAST:  60mL OMNIPAQUE IOHEXOL 350 MG/ML SOLN COMPARISON:  09/19/2008 FINDINGS: Lower chest: Small linear patchy infiltrates are seen in lower lung fields. Subpleural blebs are seen in the anterior lower lung fields, more so on the right side. Coronary artery calcifications are seen. Metallic sutures are seen in sternum, possibly suggesting previous coronary bypass surgery. Hepatobiliary: Numerous small gallbladder stones are seen. There is no dilation of bile ducts. Pancreas: No focal abnormalities are seen. Spleen: Unremarkable. Adrenals/Urinary Tract: Adrenals are unremarkable. There is contrast in the pelvocaliceal system and proximal right ureter in the right kidney limiting evaluation for small nonobstructing stone. There is marked left hydronephrosis. There is marked left  perinephric stranding. There is a 9 mm calculus in the proximal left ureter slightly below the level of ureteropelvic junction. There is a fluid around the left renal pelvis and proximal left ureter. There is 16 mm calculus in the upper pole of left kidney. Distal ureters are not dilated. Evaluation of urinary bladder is limited by beam hardening artifacts. Stomach/Bowel: Small hiatal hernia is seen. Stomach is not distended. Small bowel loops are not dilated. The appendix is not dilated. Moderate to large amount of stool is seen in ascending and transverse colon. Small amount of stool is seen in the rectosigmoid. Evaluation rectosigmoid is limited by artifacts. Vascular/Lymphatic: Coarse calcifications are seen in aorta and its major branches. Reproductive: Prostate appears to be enlarged in size. Possible calcifications are seen in seminal vesicles. Beam hardening artifacts limit evaluation of pelvic structures. Other: There is no ascites or pneumoperitoneum. Musculoskeletal: There is previous right hip arthroplasty. There is previous internal fixation in left femur with 3 surgical screws in the head and neck. Degenerative changes are noted in lower lumbar spine. Last lumbar vertebra is transitional. IMPRESSION: There is a 9 mm calculus in the proximal left ureter causing high-grade obstruction with severe left hydronephrosis and marked left perinephric stranding. Perinephric stranding may be due to high-grade ureteric obstruction or superimposed pyelonephritis. There is 16 mm calculus in the upper pole of left kidney. Small linear patchy infiltrates in the lower lung fields may suggest scarring or subsegmental atelectasis. There is no evidence of intestinal obstruction or pneumoperitoneum. Small hiatal hernia. Gallbladder stones. Arteriosclerosis. Coronary artery disease. Lumbar spondylosis. Other findings as described in the body of the report. Electronically Signed   By: Ernie Avena M.D.   On:  02/23/2023 20:56   DG Abd 1 View  Result Date: 02/22/2023 CLINICAL DATA:  Nausea and vomiting EXAM: ABDOMEN - 1 VIEW COMPARISON:  None Available. FINDINGS: Scattered large and small bowel gas is noted. No obstructive changes are seen. Postsurgical changes are noted in the hips  bilaterally. No acute bony abnormality is seen. IMPRESSION: No acute abnormality noted. Electronically Signed   By: Alcide Clever M.D.   On: 02/22/2023 00:41   DG Chest 2 View  Result Date: 02/21/2023 CLINICAL DATA:  Dyspnea. EXAM: CHEST - 2 VIEW COMPARISON:  Chest radiographs 02/12/2023 and 02/25/2022 and 02/17/2022 FINDINGS: Status post median sternotomy. Cardiac silhouette is at the upper limits of normal size. Moderate atherosclerotic calcifications within the aortic arch. Mildly decreased lung volumes. Mild bibasilar subsegmental atelectasis. No pleural effusion pneumothorax. Mild multilevel degenerative disc changes of the thoracic spine. IMPRESSION: Mildly decreased lung volumes with mild bibasilar subsegmental atelectasis. Electronically Signed   By: Neita Garnet M.D.   On: 02/21/2023 23:21    Procedures Procedures    Medications Ordered in ED Medications  HYDROmorphone (DILAUDID) injection 0.5 mg (has no administration in time range)  ondansetron (ZOFRAN) injection 4 mg (has no administration in time range)  lactated ringers bolus 1,000 mL (1,000 mLs Intravenous New Bag/Given 02/23/23 1931)  iohexol (OMNIPAQUE) 350 MG/ML injection 60 mL (60 mLs Intravenous Contrast Given 02/23/23 2039)    ED Course/ Medical Decision Making/ A&P                             Medical Decision Making Problems Addressed: Abdominal pain, generalized: acute illness or injury with systemic symptoms that poses a threat to life or bodily functions Gallstones: chronic illness or injury Hydronephrosis of left kidney: acute illness or injury with systemic symptoms Left ureteral stone: acute illness or injury with systemic symptoms that  poses a threat to life or bodily functions Other constipation: chronic illness or injury  Amount and/or Complexity of Data Reviewed Independent Historian: EMS    Details: EMS/FAMILY.  External Data Reviewed: notes. Labs: ordered. Decision-making details documented in ED Course. Radiology: ordered and independent interpretation performed. Decision-making details documented in ED Course. ECG/medicine tests: ordered and independent interpretation performed. Decision-making details documented in ED Course. Discussion of management or test interpretation with external provider(s): Urology, hospitalists.   Risk Prescription drug management. Parenteral controlled substances. Decision regarding hospitalization.   Iv ns. Continuous pulse ox and cardiac monitoring. Labs ordered/sent. Imaging ordered.   Differential diagnosis includes diverticulitis, gastroenteritis, dehydration, etc.. Dispo decision including potential need for admission considered - will get labs and imaging and reassess.   Reviewed nursing notes and prior charts for additional history. External reports reviewed. Additional history from: EMS.  Cardiac monitor: sinus rhythm, rate 78.  Labs reviewed/interpreted by me - wbc and hgb normal. K normal. Ua pending.   CT reviewed/interpreted by me - left ureteral stone w hydro. Moderate stool.   Dilaudid iv. Zofran iv.   Urology consulted re large prox ureteral stone/pain. Discussed pt with Dr Delanna Ahmadi who reviewed ct - he indicates if UA grossly positive for uti, can call back tonight, otherwise if pain control/aki issue, can admit to hospitalists.   In looking at pt record, two recent prior admission re nv, ?dehydration ?aki  -  question whether ureteral stone present then (as even today, pt not able to localize pain/discomfort).  Given persistent elevated cr compared to prior, obstructing stone/hydro, recurrent nausea/vomiting - will consult hospitalists for admission/obs. Ua  remains pending.   Ua remains pending, rn to send.   Hospitalist consulted for admission. Discussed pt with Dr Antionette Char - will admit.  Plan being if UA returns grossly positive for infection tonight - call urology back tonight, otherwise consult in AM.  Final Clinical Impression(s) / ED Diagnoses Final diagnoses:  None    Rx / DC Orders ED Discharge Orders     None          Cathren Laine, MD 02/23/23 2244

## 2023-02-24 ENCOUNTER — Inpatient Hospital Stay (HOSPITAL_COMMUNITY): Payer: No Typology Code available for payment source

## 2023-02-24 ENCOUNTER — Other Ambulatory Visit: Payer: Self-pay

## 2023-02-24 DIAGNOSIS — N135 Crossing vessel and stricture of ureter without hydronephrosis: Secondary | ICD-10-CM | POA: Diagnosis not present

## 2023-02-24 LAB — BASIC METABOLIC PANEL
Anion gap: 11 (ref 5–15)
BUN: 21 mg/dL (ref 8–23)
CO2: 22 mmol/L (ref 22–32)
Calcium: 8.6 mg/dL — ABNORMAL LOW (ref 8.9–10.3)
Chloride: 100 mmol/L (ref 98–111)
Creatinine, Ser: 1.3 mg/dL — ABNORMAL HIGH (ref 0.61–1.24)
GFR, Estimated: 58 mL/min — ABNORMAL LOW (ref >60)
Glucose, Bld: 135 mg/dL — ABNORMAL HIGH (ref 70–99)
Potassium: 3.7 mmol/L (ref 3.5–5.1)
Sodium: 133 mmol/L — ABNORMAL LOW (ref 135–145)

## 2023-02-24 LAB — GLUCOSE, CAPILLARY
Glucose-Capillary: 143 mg/dL — ABNORMAL HIGH (ref 70–99)
Glucose-Capillary: 111 mg/dL — ABNORMAL HIGH (ref 70–99)
Glucose-Capillary: 116 mg/dL — ABNORMAL HIGH (ref 70–99)
Glucose-Capillary: 96 mg/dL (ref 70–99)
Glucose-Capillary: 129 mg/dL — ABNORMAL HIGH (ref 70–99)
Glucose-Capillary: 132 mg/dL — ABNORMAL HIGH (ref 70–99)

## 2023-02-24 LAB — CBC
HCT: 39.4 % (ref 39.0–52.0)
Hemoglobin: 13.4 g/dL (ref 13.0–17.0)
MCH: 35 pg — ABNORMAL HIGH (ref 26.0–34.0)
MCHC: 34 g/dL (ref 30.0–36.0)
MCV: 102.9 fL — ABNORMAL HIGH (ref 80.0–100.0)
Platelets: 246 10*3/uL (ref 150–400)
RBC: 3.83 MIL/uL — ABNORMAL LOW (ref 4.22–5.81)
RDW: 13.3 % (ref 11.5–15.5)
WBC: 9.5 10*3/uL (ref 4.0–10.5)
nRBC: 0 % (ref 0.0–0.2)

## 2023-02-24 LAB — HEPATIC FUNCTION PANEL
ALT: 28 U/L (ref 0–44)
AST: 34 U/L (ref 15–41)
Albumin: 3 g/dL — ABNORMAL LOW (ref 3.5–5.0)
Alkaline Phosphatase: 57 U/L (ref 38–126)
Bilirubin, Direct: 0.3 mg/dL — ABNORMAL HIGH (ref 0.0–0.2)
Indirect Bilirubin: 0.8 mg/dL (ref 0.3–0.9)
Total Bilirubin: 1.1 mg/dL (ref 0.3–1.2)
Total Protein: 7.2 g/dL (ref 6.5–8.1)

## 2023-02-24 LAB — MAGNESIUM: Magnesium: 1.7 mg/dL (ref 1.7–2.4)

## 2023-02-24 LAB — HEMOGLOBIN A1C
Hgb A1c MFr Bld: 5.7 % — ABNORMAL HIGH (ref 4.8–5.6)
Mean Plasma Glucose: 116.89 mg/dL

## 2023-02-24 MED ORDER — OXYCODONE HCL 5 MG PO TABS: 5.0000 mg | ORAL_TABLET | ORAL | Status: DC | PRN

## 2023-02-24 MED ORDER — FENTANYL CITRATE PF 50 MCG/ML IJ SOSY: 25.0000 ug | PREFILLED_SYRINGE | INTRAMUSCULAR | Status: DC | PRN

## 2023-02-24 NOTE — ED Notes (Signed)
Patient transported to  via [facility] at this time °

## 2023-02-24 NOTE — H&P (View-Only) (Signed)
Urology Consult   Physician requesting consult: William C Lancaster, MD  Reason for consult: Nephrolithiasis  History of Present Illness: Derek [name] is a 77 y.o. male with a PMH of T2DM, CAD s/p CABG, DJD, HTN, PTSD, sarcoidosis, and recent admission for nausea/vomiting and AKI who presented to [facility] ED with left abdominal and flank pain. CT scan demonstrating a left 9mm proximal ureteral stone and 16mm renal stone. Urology was consulted for recommendations on management.  Patient was admitted 02/21/23 for management of nausea/vomiting and AKI. A KUB was obtained at that time that was unremarkable; patient did not have cross-sectional imaging. The etiology for his ailment was undetermined and patient was discharged on 02/22/23. Patient then re-presented to the ED with abdominal and left flank pain and recurrent n/v. He denies fevers. A CT A/P was obtained that demonstrates a 9mm proximal left ureteral stone and a 16mm upper pole renal stone. He is afebrile and HDS. Cr 1.30 (b/l ~0.7-1; 1.49 on discharge 02/22/23). WBC 9.5. UA without infection. He is voiding spontaneously.  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   H/O: upper GI bleed 01/2001    related to nonsteroidal use, CLO test +(Med Tx- Prilosec, Amoxicillin, & Biaxin)   Hypertension    Insomnia    Mental disorder    PTSD   Peptic ulcer disease    history of gastric ulcer   PTSD (post-traumatic stress disorder)    [facility]   Sarcoidosis    late 1970s   Scleritis    Sleep apnea    cpap with O2 4L for 4 yrs   Tubular adenoma 2004    Past Surgical History:  Procedure Laterality Date   CARDIAC CATHETERIZATION     CORONARY ARTERY BYPASS GRAFT  12/02/2017   at [facility] Dr Kincaid Surgeon   EYE SURGERY  05   growth lft   HIP PINNING,CANNULATED Left 02/12/2022   Procedure: CANNULATED HIP PINNING;  Surgeon: Adair,  Christopher R, MD;  Location: [facility];  Service: Orthopedics;  Laterality: Left;   TOTAL HIP ARTHROPLASTY  06/13/2012   TOTAL HIP ARTHROPLASTY  06/13/2012   Procedure: TOTAL HIP ARTHROPLASTY ANTERIOR APPROACH;  Surgeon: Peter G Dalldorf, MD;  Location: [facility] OR;  Service: Orthopedics;  Laterality: Right;  right anterior approach total hip arthroplasty    Current Hospital Medications:  Home Meds:  No current facility-administered medications on file prior to encounter.   Current Outpatient Medications on File Prior to Encounter  Medication Sig Dispense Refill   allopurinol (ZYLOPRIM) 300 MG tablet Take 300 mg by mouth daily.     aspirin EC 81 MG tablet Take 81 mg by mouth daily. Swallow whole.     cholecalciferol (VITAMIN D3) 25 MCG (1000 UNIT) tablet Take 1,000 Units by mouth daily.     feeding supplement (BOOST HIGH PROTEIN) LIQD Take 1 Container by mouth daily.     FLUoxetine (PROZAC) 20 MG capsule Take 20 mg by mouth daily.     furosemide (LASIX) 20 MG tablet Take 0.5 tablets (10 mg total) by mouth daily as needed for fluid or edema. (Patient taking differently: Take 20 mg by mouth daily.) 30 tablet    metFORMIN (GLUCOPHAGE-XR) 500 MG 24 hr tablet Take 1,000 mg by mouth daily with breakfast.     methotrexate (RHEUMATREX) 2.5 MG tablet Take 10   mg by mouth 2 (two) times a week. Friday & Saturday     metoprolol succinate (TOPROL-XL) 25 MG 24 hr tablet Take 12.5 mg by mouth every morning.     rosuvastatin (CRESTOR) 20 MG tablet Take 20 mg by mouth daily.       Scheduled Meds:  aspirin EC  81 mg Oral Daily   FLUoxetine  20 mg Oral Daily   heparin  5,000 Units Subcutaneous Q8H    HYDROmorphone (DILAUDID) injection  0.5 mg Intravenous Once   insulin aspart  0-6 Units Subcutaneous Q4H   metoprolol succinate  12.5 mg Oral q morning   rosuvastatin  20 mg Oral Daily   Continuous Infusions:  sodium chloride 125 mL/hr at 02/24/23 0243   PRN Meds:.acetaminophen **OR** acetaminophen, fentaNYL  (SUBLIMAZE) injection, hydrALAZINE, prochlorperazine  Allergies: No Known Allergies  Family History  Problem Relation Age of Onset   Stroke Mother    Hypertension Mother    Hypertension Father    Hypertension Brother    Gout Brother    Deep vein thrombosis Brother     Social History:  reports that he has never smoked. He has never used smokeless tobacco. He reports current alcohol use. He reports that he does not use drugs.  ROS: A complete review of systems was performed.  All systems are negative except for pertinent findings as noted.  Physical Exam:  Vital signs in last 24 hours: Temp:  [98.2 F (36.8 C)-99.6 F (37.6 C)] 98.6 F (37 C) (05/16 0700) Pulse Rate:  [71-88] 77 (05/16 0700) Resp:  [16-30] 19 (05/16 0700) BP: (124-180)/(64-91) 165/80 (05/16 0700) SpO2:  [92 %-100 %] 98 % (05/16 0700) Weight:  [76.3 kg] 76.3 kg (05/16 0253) Constitutional:  Alert and oriented, No acute distress Cardiovascular: Regular rate and rhythm, No JVD Respiratory: Normal respiratory effort, Lungs clear bilaterally GI: Abdomen is soft, nontender, nondistended, no abdominal masses GU: No CVA tenderness. Voiding spontaneously. Lymphatic: No lymphadenopathy Neurologic: Grossly intact, no focal deficits Psychiatric: Normal mood and affect  Laboratory Data:  Recent Labs    02/21/23 1850 02/22/23 0048 02/23/23 1642 02/24/23 0449  WBC 8.9 9.9 9.4 9.5  HGB 14.7 14.2 15.2 13.4  HCT 43.5 41.7 44.2 39.4  PLT 292 247 288 246    Recent Labs    02/21/23 1850 02/22/23 0048 02/23/23 1642 02/24/23 0449  NA 131* 132* 132* 133*  K 3.9 4.1 4.8 3.7  CL 98 101 97* 100  GLUCOSE 162* 155* 187* 135*  BUN 18 17 21 21  CALCIUM 9.2 9.1 9.5 8.6*  CREATININE 1.64* 1.49* 1.52* 1.30*     Results for orders placed or performed during the hospital encounter of 02/23/23 (from the past 24 hour(s))  Comprehensive metabolic panel     Status: Abnormal   Collection Time: 02/23/23  4:42 PM  Result  Value Ref Range   Sodium 132 (L) 135 - 145 mmol/L   Potassium 4.8 3.5 - 5.1 mmol/L   Chloride 97 (L) 98 - 111 mmol/L   CO2 23 22 - 32 mmol/L   Glucose, Bld 187 (H) 70 - 99 mg/dL   BUN 21 8 - 23 mg/dL   Creatinine, Ser 1.52 (H) 0.61 - 1.24 mg/dL   Calcium 9.5 8.9 - 10.3 mg/dL   Total Protein 8.0 6.5 - 8.1 g/dL   Albumin 3.3 (L) 3.5 - 5.0 g/dL   AST 60 (H) 15 - 41 U/L   ALT 36 0 - 44 U/L   Alkaline Phosphatase   74 38 - 126 U/L   Total Bilirubin 1.7 (H) 0.3 - 1.2 mg/dL   GFR, Estimated 48 (L) >60 mL/min   Anion gap 12 5 - 15  CBC     Status: Abnormal   Collection Time: 02/23/23  4:42 PM  Result Value Ref Range   WBC 9.4 4.0 - 10.5 K/uL   RBC 4.30 4.22 - 5.81 MIL/uL   Hemoglobin 15.2 13.0 - 17.0 g/dL   HCT 44.2 39.0 - 52.0 %   MCV 102.8 (H) 80.0 - 100.0 fL   MCH 35.3 (H) 26.0 - 34.0 pg   MCHC 34.4 30.0 - 36.0 g/dL   RDW 13.8 11.5 - 15.5 %   Platelets 288 150 - 400 K/uL   nRBC 0.0 0.0 - 0.2 %  Lipase, blood     Status: None   Collection Time: 02/23/23  4:42 PM  Result Value Ref Range   Lipase 35 11 - 51 U/L  Troponin I (High Sensitivity)     Status: None   Collection Time: 02/23/23  4:42 PM  Result Value Ref Range   Troponin I (High Sensitivity) 8 <18 ng/L  Urinalysis, Routine w reflex microscopic -Urine, Clean Catch     Status: Abnormal   Collection Time: 02/23/23 10:35 PM  Result Value Ref Range   Color, Urine YELLOW YELLOW   APPearance CLEAR CLEAR   Specific Gravity, Urine 1.036 (H) 1.005 - 1.030   pH 6.0 5.0 - 8.0   Glucose, UA NEGATIVE NEGATIVE mg/dL   Hgb urine dipstick SMALL (A) NEGATIVE   Bilirubin Urine NEGATIVE NEGATIVE   Ketones, ur NEGATIVE NEGATIVE mg/dL   Protein, ur 100 (A) NEGATIVE mg/dL   Nitrite NEGATIVE NEGATIVE   Leukocytes,Ua NEGATIVE NEGATIVE   RBC / HPF 0-5 0 - 5 RBC/hpf   WBC, UA 0-5 0 - 5 WBC/hpf   Bacteria, UA NONE SEEN NONE SEEN   Squamous Epithelial / HPF 0-5 0 - 5 /HPF   Mucus PRESENT   Glucose, capillary     Status: Abnormal    Collection Time: 02/24/23  4:07 AM  Result Value Ref Range   Glucose-Capillary 143 (H) 70 - 99 mg/dL  Hemoglobin A1c     Status: Abnormal   Collection Time: 02/24/23  4:49 AM  Result Value Ref Range   Hgb A1c MFr Bld 5.7 (H) 4.8 - 5.6 %   Mean Plasma Glucose 116.89 mg/dL  Basic metabolic panel     Status: Abnormal   Collection Time: 02/24/23  4:49 AM  Result Value Ref Range   Sodium 133 (L) 135 - 145 mmol/L   Potassium 3.7 3.5 - 5.1 mmol/L   Chloride 100 98 - 111 mmol/L   CO2 22 22 - 32 mmol/L   Glucose, Bld 135 (H) 70 - 99 mg/dL   BUN 21 8 - 23 mg/dL   Creatinine, Ser 1.30 (H) 0.61 - 1.24 mg/dL   Calcium 8.6 (L) 8.9 - 10.3 mg/dL   GFR, Estimated 58 (L) >60 mL/min   Anion gap 11 5 - 15  Magnesium     Status: None   Collection Time: 02/24/23  4:49 AM  Result Value Ref Range   Magnesium 1.7 1.7 - 2.4 mg/dL  Hepatic function panel     Status: Abnormal   Collection Time: 02/24/23  4:49 AM  Result Value Ref Range   Total Protein 7.2 6.5 - 8.1 g/dL   Albumin 3.0 (L) 3.5 - 5.0 g/dL   AST 34 15 - 41   U/L   ALT 28 0 - 44 U/L   Alkaline Phosphatase 57 38 - 126 U/L   Total Bilirubin 1.1 0.3 - 1.2 mg/dL   Bilirubin, Direct 0.3 (H) 0.0 - 0.2 mg/dL   Indirect Bilirubin 0.8 0.3 - 0.9 mg/dL  CBC     Status: Abnormal   Collection Time: 02/24/23  4:49 AM  Result Value Ref Range   WBC 9.5 4.0 - 10.5 K/uL   RBC 3.83 (L) 4.22 - 5.81 MIL/uL   Hemoglobin 13.4 13.0 - 17.0 g/dL   HCT 39.4 39.0 - 52.0 %   MCV 102.9 (H) 80.0 - 100.0 fL   MCH 35.0 (H) 26.0 - 34.0 pg   MCHC 34.0 30.0 - 36.0 g/dL   RDW 13.3 11.5 - 15.5 %   Platelets 246 150 - 400 K/uL   nRBC 0.0 0.0 - 0.2 %   No results found for this or any previous visit (from the past 240 hour(s)).  Renal Function: Recent Labs    02/21/23 1850 02/22/23 0048 02/23/23 1642 02/24/23 0449  CREATININE 1.64* 1.49* 1.52* 1.30*   Estimated Creatinine Clearance: 46.6 mL/min (A) (by C-G formula based on SCr of 1.3 mg/dL (H)).  Radiologic  Imaging: CT ABDOMEN PELVIS W CONTRAST  Result Date: 02/23/2023 CLINICAL DATA:  Abdominal pain EXAM: CT ABDOMEN AND PELVIS WITH CONTRAST TECHNIQUE: Multidetector CT imaging of the abdomen and pelvis was performed using the standard protocol following bolus administration of intravenous contrast. RADIATION DOSE REDUCTION: This exam was performed according to the departmental dose-optimization program which includes automated exposure control, adjustment of the mA and/or kV according to patient size and/or use of iterative reconstruction technique. CONTRAST:  60mL OMNIPAQUE IOHEXOL 350 MG/ML SOLN COMPARISON:  09/19/2008 FINDINGS: Lower chest: Small linear patchy infiltrates are seen in lower lung fields. Subpleural blebs are seen in the anterior lower lung fields, more so on the right side. Coronary artery calcifications are seen. Metallic sutures are seen in sternum, possibly suggesting previous coronary bypass surgery. Hepatobiliary: Numerous small gallbladder stones are seen. There is no dilation of bile ducts. Pancreas: No focal abnormalities are seen. Spleen: Unremarkable. Adrenals/Urinary Tract: Adrenals are unremarkable. There is contrast in the pelvocaliceal system and proximal right ureter in the right kidney limiting evaluation for small nonobstructing stone. There is marked left hydronephrosis. There is marked left perinephric stranding. There is a 9 mm calculus in the proximal left ureter slightly below the level of ureteropelvic junction. There is a fluid around the left renal pelvis and proximal left ureter. There is 16 mm calculus in the upper pole of left kidney. Distal ureters are not dilated. Evaluation of urinary bladder is limited by beam hardening artifacts. Stomach/Bowel: Small hiatal hernia is seen. Stomach is not distended. Small bowel loops are not dilated. The appendix is not dilated. Moderate to large amount of stool is seen in ascending and transverse colon. Small amount of stool is seen  in the rectosigmoid. Evaluation rectosigmoid is limited by artifacts. Vascular/Lymphatic: Coarse calcifications are seen in aorta and its major branches. Reproductive: Prostate appears to be enlarged in size. Possible calcifications are seen in seminal vesicles. Beam hardening artifacts limit evaluation of pelvic structures. Other: There is no ascites or pneumoperitoneum. Musculoskeletal: There is previous right hip arthroplasty. There is previous internal fixation in left femur with 3 surgical screws in the head and neck. Degenerative changes are noted in lower lumbar spine. Last lumbar vertebra is transitional. IMPRESSION: There is a 9 mm calculus in the proximal left ureter   causing high-grade obstruction with severe left hydronephrosis and marked left perinephric stranding. Perinephric stranding may be due to high-grade ureteric obstruction or superimposed pyelonephritis. There is 16 mm calculus in the upper pole of left kidney. Small linear patchy infiltrates in the lower lung fields may suggest scarring or subsegmental atelectasis. There is no evidence of intestinal obstruction or pneumoperitoneum. Small hiatal hernia. Gallbladder stones. Arteriosclerosis. Coronary artery disease. Lumbar spondylosis. Other findings as described in the body of the report. Electronically Signed   By: Palani  Rathinasamy M.D.   On: 02/23/2023 20:56    I independently reviewed the above imaging studies.  Impression/Recommendation #Left nephrolithiasis - Cumulative stone burden >2cm, 9mm proximal ureter and 16mm renal - Pain well-controlled, nausea/vomiting resolved - AKI improving - Non-infected - Discussed MET, L ureteral stent for pain, L PCN in anticipation of likely PCNL for cumulative stone burden. Will discuss further with patient this AM to determine best course of action - Please keep patient NPO at this time  Omaria Plunk 02/24/2023, 7:03 AM    

## 2023-02-24 NOTE — Progress Notes (Signed)
PROGRESS NOTE    Derek [name]  ZOX:[id number] DOB: 02-26-1946 DOA: 02/23/2023 PCP: [facility]   Brief Narrative:  Derek [name] is a 77 y.o. male with medical history significant for type 2 diabetes mellitus, OSA no longer using CPAP, CAD status post CABG, TIA, dementia, and recent admission for AKI who presents to the emergency department with intractable flank pain, nausea vomiting and poor p.o. intake.  Imaging consistent with nephrolithiasis that appear to be obstructing with severe hydronephrosis and perinephric stranding.  Hospitalist called for admission, urology called in consult.  Assessment & Plan:   Principal Problem:   Ureteral obstruction, left Active Problems:   Dementia without behavioral disturbance (HCC)   AKI (acute kidney injury) (HCC)   History of TIA (transient ischemic attack)   Controlled NIDDM-2 with hyperlipidemia   CAD s/p CABG in 2019 at [facility]   Rheumatoid arthritis (HCC)   Prolonged QT interval   Left ureteral obstruction  Severe left hydronephrosis/perinephric stranding Intractable nausea, vomiting and abdominal pain - Continue pain-control -currently well controlled on fentanyl, tylenol - Urology consulted - plan for intervention next week, okay with discharge if symptoms are controlled well    AKI, improving - SCr is 1.52 on admission, up from baseline of ~1 - He has unilateral ureteral obstruction, poor appetite for weeks, and recent N/V - Continue IVF -improving appropriately, UOP appropriate   CAD  - No anginal complaints  - Continue ASA, Crestor, and metoprolol    Type II DM, well controlled - A1c was 5.4% in May 2023, 5.7 currently - Hold metformin, check CBGs, and use low-intensity SSI for now     Dementia  - Use delirium precautions      Prolonged QT interval  - Check magnesium level, avoid QT-prolonging medications      DVT prophylaxis: sq heparin  Code Status: Full  Family Communication: wife at  bedside  Status is: Inpt  Dispo: The patient is from: Home              Anticipated d/c is to: Home              Anticipated d/c date is: 24-48h              Patient currently not medically stable for discharge  Consultants:  Urology  Procedures:  None planned  Antimicrobials:  None indicated  Subjective: No acute issues or events overnight or this morning, pain appears to be well-controlled now.  Patient denies any other symptoms denies nausea vomiting diarrhea constipation headache fevers chills chest pain or shortness of breath.  Objective: Vitals:   02/24/23 0145 02/24/23 0241 02/24/23 0253 02/24/23 0700  BP: 139/71 (!) 163/87  (!) 165/80  Pulse: 76 76  77  Resp: (!) 22 18  19   Temp: 98.9 F (37.2 C) 99.6 F (37.6 C)  98.6 F (37 C)  TempSrc: Oral Oral  Oral  SpO2: 94% 99%  98%  Weight:   76.3 kg   Height:   5\' 7"  (1.702 m)     Intake/Output Summary (Last 24 hours) at 02/24/2023 0713 Last data filed at 02/24/2023 0331 Gross per 24 hour  Intake 1099.01 ml  Output 400 ml  Net 699.01 ml   Filed Weights   02/24/23 0253  Weight: 76.3 kg    Examination:  General:  Pleasantly resting in bed, No acute distress. HEENT:  Normocephalic atraumatic.  Sclerae nonicteric, noninjected.  Extraocular movements intact bilaterally. Neck:  Without mass or deformity.  Trachea is midline. Lungs:  Clear to auscultate bilaterally without rhonchi, wheeze, or rales. Heart:  Regular rate and rhythm.  Without murmurs, rubs, or gallops. Abdomen:  Soft, nontender, nondistended.  Extremities: Without cyanosis, clubbing, edema, or obvious deformity. Vascular: Distal pulses palpable bilaterally.   Data Reviewed: I have personally reviewed following labs and imaging studies  CBC: Recent Labs  Lab 02/21/23 1850 02/22/23 0048 02/23/23 1642 02/24/23 0449  WBC 8.9 9.9 9.4 9.5  HGB 14.7 14.2 15.2 13.4  HCT 43.5 41.7 44.2 39.4  MCV 103.3* 101.7* 102.8* 102.9*  PLT 292 247 288 246    Basic Metabolic Panel: Recent Labs  Lab 02/21/23 1850 02/22/23 0048 02/23/23 1642 02/24/23 0449  NA 131* 132* 132* 133*  K 3.9 4.1 4.8 3.7  CL 98 101 97* 100  CO2 21* 21* 23 22  GLUCOSE 162* 155* 187* 135*  BUN 18 17 21 21   CREATININE 1.64* 1.49* 1.52* 1.30*  CALCIUM 9.2 9.1 9.5 8.6*  MG  --   --   --  1.7   GFR: Estimated Creatinine Clearance: 46.6 mL/min (A) (by C-G formula based on SCr of 1.3 mg/dL (H)). Liver Function Tests: Recent Labs  Lab 02/21/23 1850 02/23/23 1642 02/24/23 0449  AST 46* 60* 34  ALT 30 36 28  ALKPHOS 66 74 57  BILITOT 0.9 1.7* 1.1  PROT 8.1 8.0 7.2  ALBUMIN 3.5 3.3* 3.0*   Recent Labs  Lab 02/21/23 2325 02/23/23 1642  LIPASE 32 35   No results for input(s): "AMMONIA" in the last 168 hours. Coagulation Profile: No results for input(s): "INR", "PROTIME" in the last 168 hours. Cardiac Enzymes: No results for input(s): "CKTOTAL", "CKMB", "CKMBINDEX", "TROPONINI" in the last 168 hours. BNP (last 3 results) No results for input(s): "PROBNP" in the last 8760 hours. HbA1C: Recent Labs    02/24/23 0449  HGBA1C 5.7*   CBG: Recent Labs  Lab 02/21/23 1901 02/24/23 0407  GLUCAP 152* 143*   Lipid Profile: No results for input(s): "CHOL", "HDL", "LDLCALC", "TRIG", "CHOLHDL", "LDLDIRECT" in the last 72 hours. Thyroid Function Tests: Recent Labs    02/21/23 1850  TSH 1.619   Anemia Panel: No results for input(s): "VITAMINB12", "FOLATE", "FERRITIN", "TIBC", "IRON", "RETICCTPCT" in the last 72 hours. Sepsis Labs: No results for input(s): "PROCALCITON", "LATICACIDVEN" in the last 168 hours.  No results found for this or any previous visit (from the past 240 hour(s)).       Radiology Studies: CT ABDOMEN PELVIS W CONTRAST  Result Date: 02/23/2023 CLINICAL DATA:  Abdominal pain EXAM: CT ABDOMEN AND PELVIS WITH CONTRAST TECHNIQUE: Multidetector CT imaging of the abdomen and pelvis was performed using the standard protocol following  bolus administration of intravenous contrast. RADIATION DOSE REDUCTION: This exam was performed according to the departmental dose-optimization program which includes automated exposure control, adjustment of the mA and/or kV according to patient size and/or use of iterative reconstruction technique. CONTRAST:  60mL OMNIPAQUE IOHEXOL 350 MG/ML SOLN COMPARISON:  09/19/2008 FINDINGS: Lower chest: Small linear patchy infiltrates are seen in lower lung fields. Subpleural blebs are seen in the anterior lower lung fields, more so on the right side. Coronary artery calcifications are seen. Metallic sutures are seen in sternum, possibly suggesting previous coronary bypass surgery. Hepatobiliary: Numerous small gallbladder stones are seen. There is no dilation of bile ducts. Pancreas: No focal abnormalities are seen. Spleen: Unremarkable. Adrenals/Urinary Tract: Adrenals are unremarkable. There is contrast in the pelvocaliceal system and proximal right ureter in the right kidney limiting  evaluation for small nonobstructing stone. There is marked left hydronephrosis. There is marked left perinephric stranding. There is a 9 mm calculus in the proximal left ureter slightly below the level of ureteropelvic junction. There is a fluid around the left renal pelvis and proximal left ureter. There is 16 mm calculus in the upper pole of left kidney. Distal ureters are not dilated. Evaluation of urinary bladder is limited by beam hardening artifacts. Stomach/Bowel: Small hiatal hernia is seen. Stomach is not distended. Small bowel loops are not dilated. The appendix is not dilated. Moderate to large amount of stool is seen in ascending and transverse colon. Small amount of stool is seen in the rectosigmoid. Evaluation rectosigmoid is limited by artifacts. Vascular/Lymphatic: Coarse calcifications are seen in aorta and its major branches. Reproductive: Prostate appears to be enlarged in size. Possible calcifications are seen in seminal  vesicles. Beam hardening artifacts limit evaluation of pelvic structures. Other: There is no ascites or pneumoperitoneum. Musculoskeletal: There is previous right hip arthroplasty. There is previous internal fixation in left femur with 3 surgical screws in the head and neck. Degenerative changes are noted in lower lumbar spine. Last lumbar vertebra is transitional. IMPRESSION: There is a 9 mm calculus in the proximal left ureter causing high-grade obstruction with severe left hydronephrosis and marked left perinephric stranding. Perinephric stranding may be due to high-grade ureteric obstruction or superimposed pyelonephritis. There is 16 mm calculus in the upper pole of left kidney. Small linear patchy infiltrates in the lower lung fields may suggest scarring or subsegmental atelectasis. There is no evidence of intestinal obstruction or pneumoperitoneum. Small hiatal hernia. Gallbladder stones. Arteriosclerosis. Coronary artery disease. Lumbar spondylosis. Other findings as described in the body of the report. Electronically Signed   By: Ernie Avena M.D.   On: 02/23/2023 20:56    Scheduled Meds:  aspirin EC  81 mg Oral Daily   FLUoxetine  20 mg Oral Daily   heparin  5,000 Units Subcutaneous Q8H    HYDROmorphone (DILAUDID) injection  0.5 mg Intravenous Once   insulin aspart  0-6 Units Subcutaneous Q4H   metoprolol succinate  12.5 mg Oral q morning   rosuvastatin  20 mg Oral Daily   Continuous Infusions:  sodium chloride 125 mL/hr at 02/24/23 0243     LOS: 1 day   Time spent:  Azucena Fallen, DO [facility]  If 7PM-7AM, please contact night-coverage [email or web address]  02/24/2023, 7:13 AM

## 2023-02-24 NOTE — Plan of Care (Signed)
  Problem: Education: Goal: Knowledge of General Education information will improve Description: Including pain rating scale, medication(s)/side effects and non-pharmacologic comfort measures Outcome: Progressing   Problem: Health Behavior/Discharge Planning: Goal: Ability to manage health-related needs will improve Outcome: Progressing   Problem: Clinical Measurements: Goal: Ability to maintain clinical measurements within normal limits will improve Outcome: Progressing Goal: Respiratory complications will improve Outcome: Progressing Goal: Cardiovascular complication will be avoided Outcome: Progressing   Problem: Coping: Goal: Level of anxiety will decrease Outcome: Progressing   Problem: Elimination: Goal: Will not experience complications related to urinary retention Outcome: Progressing   Problem: Safety: Goal: Ability to remain free from injury will improve Outcome: Progressing   Problem: Skin Integrity: Goal: Risk for impaired skin integrity will decrease Outcome: Progressing   

## 2023-02-24 NOTE — TOC CM/SW Note (Signed)
  Transition of Care [facility]) Screening Note   Patient Details  Name: Derek [name] Date of Birth: [birth year]   Transition of Care [facility]) CM/SW Contact:    Howell Rucks, RN Phone Number: 02/24/2023, 8:53 AM    Transition of Care Department [facility]) has reviewed patient and no TOC needs have been identified at this time. We will continue to monitor patient advancement through interdisciplinary progression rounds. If new patient transition needs arise, please place a TOC consult.

## 2023-02-24 NOTE — ED Notes (Signed)
[facility] called, no eta 

## 2023-02-24 NOTE — Consult Note (Signed)
Urology Consult   Physician requesting consult: Azucena Fallen, MD  Reason for consult: Nephrolithiasis  History of Present Illness: Derek [name] is a 77 y.o. male with a PMH of T2DM, CAD s/p CABG, DJD, HTN, PTSD, sarcoidosis, and recent admission for nausea/vomiting and AKI who presented to [facility] ED with left abdominal and flank pain. CT scan demonstrating a left 9mm proximal ureteral stone and 16mm renal stone. Urology was consulted for recommendations on management.  Patient was admitted 02/21/23 for management of nausea/vomiting and AKI. A KUB was obtained at that time that was unremarkable; patient did not have cross-sectional imaging. The etiology for his ailment was undetermined and patient was discharged on 02/22/23. Patient then re-presented to the ED with abdominal and left flank pain and recurrent n/v. He denies fevers. A CT A/P was obtained that demonstrates a 9mm proximal left ureteral stone and a 16mm upper pole renal stone. He is afebrile and HDS. Cr 1.30 (b/l ~0.7-1; 1.49 on discharge 02/22/23). WBC 9.5. UA without infection. He is voiding spontaneously.  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   H/O: upper GI bleed 01/2001    related to nonsteroidal use, CLO test +(Med Tx- Prilosec, Amoxicillin, & Biaxin)   Hypertension    Insomnia    Mental disorder    PTSD   Peptic ulcer disease    history of gastric ulcer   PTSD (post-traumatic stress disorder)    [facility]   Sarcoidosis    late 1970s   Scleritis    Sleep apnea    cpap with O2 4L for 4 yrs   Tubular adenoma 2004    Past Surgical History:  Procedure Laterality Date   CARDIAC CATHETERIZATION     CORONARY ARTERY BYPASS GRAFT  12/02/2017   at [facility] Dr Ty Hilts Surgeon   EYE SURGERY  05   growth lft   HIP PINNING,CANNULATED Left 02/12/2022   Procedure: CANNULATED HIP PINNING;  Surgeon: Terance Hart, MD;  Location: [facility] ORS;  Service: Orthopedics;  Laterality: Left;   TOTAL HIP ARTHROPLASTY  06/13/2012   TOTAL HIP ARTHROPLASTY  06/13/2012   Procedure: TOTAL HIP ARTHROPLASTY ANTERIOR APPROACH;  Surgeon: Velna Ochs, MD;  Location: [facility] OR;  Service: Orthopedics;  Laterality: Right;  right anterior approach total hip arthroplasty    Current Hospital Medications:  Home Meds:  No current facility-administered medications on file prior to encounter.   Current Outpatient Medications on File Prior to Encounter  Medication Sig Dispense Refill   allopurinol (ZYLOPRIM) 300 MG tablet Take 300 mg by mouth daily.     aspirin EC 81 MG tablet Take 81 mg by mouth daily. Swallow whole.     cholecalciferol (VITAMIN D3) 25 MCG (1000 UNIT) tablet Take 1,000 Units by mouth daily.     feeding supplement (BOOST HIGH PROTEIN) LIQD Take 1 Container by mouth daily.     FLUoxetine (PROZAC) 20 MG capsule Take 20 mg by mouth daily.     furosemide (LASIX) 20 MG tablet Take 0.5 tablets (10 mg total) by mouth daily as needed for fluid or edema. (Patient taking differently: Take 20 mg by mouth daily.) 30 tablet    metFORMIN (GLUCOPHAGE-XR) 500 MG 24 hr tablet Take 1,000 mg by mouth daily with breakfast.     methotrexate (RHEUMATREX) 2.5 MG tablet Take 10  mg by mouth 2 (two) times a week. Friday & Saturday     metoprolol succinate (TOPROL-XL) 25 MG 24 hr tablet Take 12.5 mg by mouth every morning.     rosuvastatin (CRESTOR) 20 MG tablet Take 20 mg by mouth daily.       Scheduled Meds:  aspirin EC  81 mg Oral Daily   FLUoxetine  20 mg Oral Daily   heparin  5,000 Units Subcutaneous Q8H    HYDROmorphone (DILAUDID) injection  0.5 mg Intravenous Once   insulin aspart  0-6 Units Subcutaneous Q4H   metoprolol succinate  12.5 mg Oral q morning   rosuvastatin  20 mg Oral Daily   Continuous Infusions:  sodium chloride 125 mL/hr at 02/24/23 0243   PRN Meds:.acetaminophen **OR** acetaminophen, fentaNYL  (SUBLIMAZE) injection, hydrALAZINE, prochlorperazine  Allergies: No Known Allergies  Family History  Problem Relation Age of Onset   Stroke Mother    Hypertension Mother    Hypertension Father    Hypertension Brother    Gout Brother    Deep vein thrombosis Brother     Social History:  reports that he has never smoked. He has never used smokeless tobacco. He reports current alcohol use. He reports that he does not use drugs.  ROS: A complete review of systems was performed.  All systems are negative except for pertinent findings as noted.  Physical Exam:  Vital signs in last 24 hours: Temp:  [98.2 F (36.8 C)-99.6 F (37.6 C)] 98.6 F (37 C) (05/16 0700) Pulse Rate:  [71-88] 77 (05/16 0700) Resp:  [16-30] 19 (05/16 0700) BP: (124-180)/(64-91) 165/80 (05/16 0700) SpO2:  [92 %-100 %] 98 % (05/16 0700) Weight:  [76.3 kg] 76.3 kg (05/16 0253) Constitutional:  Alert and oriented, No acute distress Cardiovascular: Regular rate and rhythm, No JVD Respiratory: Normal respiratory effort, Lungs clear bilaterally GI: Abdomen is soft, nontender, nondistended, no abdominal masses GU: No CVA tenderness. Voiding spontaneously. Lymphatic: No lymphadenopathy Neurologic: Grossly intact, no focal deficits Psychiatric: Normal mood and affect  Laboratory Data:  Recent Labs    02/21/23 1850 02/22/23 0048 02/23/23 1642 02/24/23 0449  WBC 8.9 9.9 9.4 9.5  HGB 14.7 14.2 15.2 13.4  HCT 43.5 41.7 44.2 39.4  PLT 292 247 288 246    Recent Labs    02/21/23 1850 02/22/23 0048 02/23/23 1642 02/24/23 0449  NA 131* 132* 132* 133*  K 3.9 4.1 4.8 3.7  CL 98 101 97* 100  GLUCOSE 162* 155* 187* 135*  BUN 18 17 21 21   CALCIUM 9.2 9.1 9.5 8.6*  CREATININE 1.64* 1.49* 1.52* 1.30*     Results for orders placed or performed during the hospital encounter of 02/23/23 (from the past 24 hour(s))  Comprehensive metabolic panel     Status: Abnormal   Collection Time: 02/23/23  4:42 PM  Result  Value Ref Range   Sodium 132 (L) 135 - 145 mmol/L   Potassium 4.8 3.5 - 5.1 mmol/L   Chloride 97 (L) 98 - 111 mmol/L   CO2 23 22 - 32 mmol/L   Glucose, Bld 187 (H) 70 - 99 mg/dL   BUN 21 8 - 23 mg/dL   Creatinine, Ser 1.61 (H) 0.61 - 1.24 mg/dL   Calcium 9.5 8.9 - 09.6 mg/dL   Total Protein 8.0 6.5 - 8.1 g/dL   Albumin 3.3 (L) 3.5 - 5.0 g/dL   AST 60 (H) 15 - 41 U/L   ALT 36 0 - 44 U/L   Alkaline Phosphatase  74 38 - 126 U/L   Total Bilirubin 1.7 (H) 0.3 - 1.2 mg/dL   GFR, Estimated 48 (L) >60 mL/min   Anion gap 12 5 - 15  CBC     Status: Abnormal   Collection Time: 02/23/23  4:42 PM  Result Value Ref Range   WBC 9.4 4.0 - 10.5 K/uL   RBC 4.30 4.22 - 5.81 MIL/uL   Hemoglobin 15.2 13.0 - 17.0 g/dL   HCT 91.4 78.2 - 95.6 %   MCV 102.8 (H) 80.0 - 100.0 fL   MCH 35.3 (H) 26.0 - 34.0 pg   MCHC 34.4 30.0 - 36.0 g/dL   RDW 21.3 08.6 - 57.8 %   Platelets 288 150 - 400 K/uL   nRBC 0.0 0.0 - 0.2 %  Lipase, blood     Status: None   Collection Time: 02/23/23  4:42 PM  Result Value Ref Range   Lipase 35 11 - 51 U/L  Troponin I (High Sensitivity)     Status: None   Collection Time: 02/23/23  4:42 PM  Result Value Ref Range   Troponin I (High Sensitivity) 8 <18 ng/L  Urinalysis, Routine w reflex microscopic -Urine, Clean Catch     Status: Abnormal   Collection Time: 02/23/23 10:35 PM  Result Value Ref Range   Color, Urine YELLOW YELLOW   APPearance CLEAR CLEAR   Specific Gravity, Urine 1.036 (H) 1.005 - 1.030   pH 6.0 5.0 - 8.0   Glucose, UA NEGATIVE NEGATIVE mg/dL   Hgb urine dipstick SMALL (A) NEGATIVE   Bilirubin Urine NEGATIVE NEGATIVE   Ketones, ur NEGATIVE NEGATIVE mg/dL   Protein, ur 469 (A) NEGATIVE mg/dL   Nitrite NEGATIVE NEGATIVE   Leukocytes,Ua NEGATIVE NEGATIVE   RBC / HPF 0-5 0 - 5 RBC/hpf   WBC, UA 0-5 0 - 5 WBC/hpf   Bacteria, UA NONE SEEN NONE SEEN   Squamous Epithelial / HPF 0-5 0 - 5 /HPF   Mucus PRESENT   Glucose, capillary     Status: Abnormal    Collection Time: 02/24/23  4:07 AM  Result Value Ref Range   Glucose-Capillary 143 (H) 70 - 99 mg/dL  Hemoglobin G2X     Status: Abnormal   Collection Time: 02/24/23  4:49 AM  Result Value Ref Range   Hgb A1c MFr Bld 5.7 (H) 4.8 - 5.6 %   Mean Plasma Glucose 116.89 mg/dL  Basic metabolic panel     Status: Abnormal   Collection Time: 02/24/23  4:49 AM  Result Value Ref Range   Sodium 133 (L) 135 - 145 mmol/L   Potassium 3.7 3.5 - 5.1 mmol/L   Chloride 100 98 - 111 mmol/L   CO2 22 22 - 32 mmol/L   Glucose, Bld 135 (H) 70 - 99 mg/dL   BUN 21 8 - 23 mg/dL   Creatinine, Ser 5.28 (H) 0.61 - 1.24 mg/dL   Calcium 8.6 (L) 8.9 - 10.3 mg/dL   GFR, Estimated 58 (L) >60 mL/min   Anion gap 11 5 - 15  Magnesium     Status: None   Collection Time: 02/24/23  4:49 AM  Result Value Ref Range   Magnesium 1.7 1.7 - 2.4 mg/dL  Hepatic function panel     Status: Abnormal   Collection Time: 02/24/23  4:49 AM  Result Value Ref Range   Total Protein 7.2 6.5 - 8.1 g/dL   Albumin 3.0 (L) 3.5 - 5.0 g/dL   AST 34 15 - 41  U/L   ALT 28 0 - 44 U/L   Alkaline Phosphatase 57 38 - 126 U/L   Total Bilirubin 1.1 0.3 - 1.2 mg/dL   Bilirubin, Direct 0.3 (H) 0.0 - 0.2 mg/dL   Indirect Bilirubin 0.8 0.3 - 0.9 mg/dL  CBC     Status: Abnormal   Collection Time: 02/24/23  4:49 AM  Result Value Ref Range   WBC 9.5 4.0 - 10.5 K/uL   RBC 3.83 (L) 4.22 - 5.81 MIL/uL   Hemoglobin 13.4 13.0 - 17.0 g/dL   HCT 16.1 09.6 - 04.5 %   MCV 102.9 (H) 80.0 - 100.0 fL   MCH 35.0 (H) 26.0 - 34.0 pg   MCHC 34.0 30.0 - 36.0 g/dL   RDW 40.9 81.1 - 91.4 %   Platelets 246 150 - 400 K/uL   nRBC 0.0 0.0 - 0.2 %   No results found for this or any previous visit (from the past 240 hour(s)).  Renal Function: Recent Labs    02/21/23 1850 02/22/23 0048 02/23/23 1642 02/24/23 0449  CREATININE 1.64* 1.49* 1.52* 1.30*   Estimated Creatinine Clearance: 46.6 mL/min (A) (by C-G formula based on SCr of 1.3 mg/dL (H)).  Radiologic  Imaging: CT ABDOMEN PELVIS W CONTRAST  Result Date: 02/23/2023 CLINICAL DATA:  Abdominal pain EXAM: CT ABDOMEN AND PELVIS WITH CONTRAST TECHNIQUE: Multidetector CT imaging of the abdomen and pelvis was performed using the standard protocol following bolus administration of intravenous contrast. RADIATION DOSE REDUCTION: This exam was performed according to the departmental dose-optimization program which includes automated exposure control, adjustment of the mA and/or kV according to patient size and/or use of iterative reconstruction technique. CONTRAST:  60mL OMNIPAQUE IOHEXOL 350 MG/ML SOLN COMPARISON:  09/19/2008 FINDINGS: Lower chest: Small linear patchy infiltrates are seen in lower lung fields. Subpleural blebs are seen in the anterior lower lung fields, more so on the right side. Coronary artery calcifications are seen. Metallic sutures are seen in sternum, possibly suggesting previous coronary bypass surgery. Hepatobiliary: Numerous small gallbladder stones are seen. There is no dilation of bile ducts. Pancreas: No focal abnormalities are seen. Spleen: Unremarkable. Adrenals/Urinary Tract: Adrenals are unremarkable. There is contrast in the pelvocaliceal system and proximal right ureter in the right kidney limiting evaluation for small nonobstructing stone. There is marked left hydronephrosis. There is marked left perinephric stranding. There is a 9 mm calculus in the proximal left ureter slightly below the level of ureteropelvic junction. There is a fluid around the left renal pelvis and proximal left ureter. There is 16 mm calculus in the upper pole of left kidney. Distal ureters are not dilated. Evaluation of urinary bladder is limited by beam hardening artifacts. Stomach/Bowel: Small hiatal hernia is seen. Stomach is not distended. Small bowel loops are not dilated. The appendix is not dilated. Moderate to large amount of stool is seen in ascending and transverse colon. Small amount of stool is seen  in the rectosigmoid. Evaluation rectosigmoid is limited by artifacts. Vascular/Lymphatic: Coarse calcifications are seen in aorta and its major branches. Reproductive: Prostate appears to be enlarged in size. Possible calcifications are seen in seminal vesicles. Beam hardening artifacts limit evaluation of pelvic structures. Other: There is no ascites or pneumoperitoneum. Musculoskeletal: There is previous right hip arthroplasty. There is previous internal fixation in left femur with 3 surgical screws in the head and neck. Degenerative changes are noted in lower lumbar spine. Last lumbar vertebra is transitional. IMPRESSION: There is a 9 mm calculus in the proximal left ureter  causing high-grade obstruction with severe left hydronephrosis and marked left perinephric stranding. Perinephric stranding may be due to high-grade ureteric obstruction or superimposed pyelonephritis. There is 16 mm calculus in the upper pole of left kidney. Small linear patchy infiltrates in the lower lung fields may suggest scarring or subsegmental atelectasis. There is no evidence of intestinal obstruction or pneumoperitoneum. Small hiatal hernia. Gallbladder stones. Arteriosclerosis. Coronary artery disease. Lumbar spondylosis. Other findings as described in the body of the report. Electronically Signed   By: Ernie Avena M.D.   On: 02/23/2023 20:56    I independently reviewed the above imaging studies.  Impression/Recommendation #Left nephrolithiasis - Cumulative stone burden >2cm, 9mm proximal ureter and 16mm renal - Pain well-controlled, nausea/vomiting resolved - AKI improving - Non-infected - Discussed MET, L ureteral stent for pain, L PCN in anticipation of likely PCNL for cumulative stone burden. Will discuss further with patient this AM to determine best course of action - Please keep patient NPO at this time  Carlus Pavlov 02/24/2023, 7:03 AM

## 2023-02-25 ENCOUNTER — Other Ambulatory Visit: Payer: Self-pay | Admitting: Urology

## 2023-02-25 DIAGNOSIS — N135 Crossing vessel and stricture of ureter without hydronephrosis: Secondary | ICD-10-CM | POA: Diagnosis not present

## 2023-02-25 LAB — CBC
HCT: 36.8 % — ABNORMAL LOW (ref 39.0–52.0)
Hemoglobin: 12.4 g/dL — ABNORMAL LOW (ref 13.0–17.0)
MCH: 34.9 pg — ABNORMAL HIGH (ref 26.0–34.0)
MCHC: 33.7 g/dL (ref 30.0–36.0)
MCV: 103.7 fL — ABNORMAL HIGH (ref 80.0–100.0)
Platelets: 214 10*3/uL (ref 150–400)
RBC: 3.55 MIL/uL — ABNORMAL LOW (ref 4.22–5.81)
RDW: 13.7 % (ref 11.5–15.5)
WBC: 8.2 10*3/uL (ref 4.0–10.5)
nRBC: 0 % (ref 0.0–0.2)

## 2023-02-25 LAB — BASIC METABOLIC PANEL
Anion gap: 7 (ref 5–15)
BUN: 21 mg/dL (ref 8–23)
CO2: 25 mmol/L (ref 22–32)
Calcium: 8.6 mg/dL — ABNORMAL LOW (ref 8.9–10.3)
Chloride: 105 mmol/L (ref 98–111)
Creatinine, Ser: 1.09 mg/dL (ref 0.61–1.24)
GFR, Estimated: >60 mL/min (ref >60)
Glucose, Bld: 91 mg/dL (ref 70–99)
Potassium: 3.5 mmol/L (ref 3.5–5.1)
Sodium: 137 mmol/L (ref 135–145)

## 2023-02-25 LAB — GLUCOSE, CAPILLARY
Glucose-Capillary: 82 mg/dL (ref 70–99)
Glucose-Capillary: 86 mg/dL (ref 70–99)
Glucose-Capillary: 96 mg/dL (ref 70–99)

## 2023-02-25 MED ORDER — OXYCODONE HCL 5 MG PO TABS: 5.0000 mg | ORAL_TABLET | ORAL | 0 refills | Status: AC | PRN

## 2023-02-25 NOTE — Plan of Care (Signed)
  Problem: Education: Goal: Knowledge of condition and prescribed therapy will improve Outcome: Adequate for Discharge   Problem: Cardiac: Goal: Will achieve and/or maintain adequate cardiac output Outcome: Adequate for Discharge   Problem: Physical Regulation: Goal: Complications related to the disease process, condition or treatment will be avoided or minimized Outcome: Adequate for Discharge   Problem: Education: Goal: Ability to describe self-care measures that may prevent or decrease complications (Diabetes Survival Skills Education) will improve Outcome: Adequate for Discharge Goal: Individualized Educational Video(s) Outcome: Adequate for Discharge   Problem: Coping: Goal: Ability to adjust to condition or change in health will improve Outcome: Adequate for Discharge   Problem: Fluid Volume: Goal: Ability to maintain a balanced intake and output will improve Outcome: Adequate for Discharge   Problem: Health Behavior/Discharge Planning: Goal: Ability to identify and utilize available resources and services will improve Outcome: Adequate for Discharge Goal: Ability to manage health-related needs will improve Outcome: Adequate for Discharge   Problem: Metabolic: Goal: Ability to maintain appropriate glucose levels will improve Outcome: Adequate for Discharge   Problem: Nutritional: Goal: Maintenance of adequate nutrition will improve Outcome: Adequate for Discharge Goal: Progress toward achieving an optimal weight will improve Outcome: Adequate for Discharge   Problem: Skin Integrity: Goal: Risk for impaired skin integrity will decrease Outcome: Adequate for Discharge   Problem: Tissue Perfusion: Goal: Adequacy of tissue perfusion will improve Outcome: Adequate for Discharge   Problem: Education: Goal: Knowledge of General Education information will improve Description: Including pain rating scale, medication(s)/side effects and non-pharmacologic comfort  measures Outcome: Adequate for Discharge   Problem: Health Behavior/Discharge Planning: Goal: Ability to manage health-related needs will improve Outcome: Adequate for Discharge   Problem: Clinical Measurements: Goal: Ability to maintain clinical measurements within normal limits will improve Outcome: Adequate for Discharge Goal: Will remain free from infection Outcome: Adequate for Discharge Goal: Diagnostic test results will improve Outcome: Adequate for Discharge Goal: Respiratory complications will improve Outcome: Adequate for Discharge Goal: Cardiovascular complication will be avoided Outcome: Adequate for Discharge   Problem: Activity: Goal: Risk for activity intolerance will decrease Outcome: Adequate for Discharge   Problem: Nutrition: Goal: Adequate nutrition will be maintained Outcome: Adequate for Discharge   Problem: Coping: Goal: Level of anxiety will decrease Outcome: Adequate for Discharge   Problem: Elimination: Goal: Will not experience complications related to bowel motility Outcome: Adequate for Discharge Goal: Will not experience complications related to urinary retention Outcome: Adequate for Discharge   Problem: Pain Managment: Goal: General experience of comfort will improve Outcome: Adequate for Discharge   Problem: Safety: Goal: Ability to remain free from injury will improve Outcome: Adequate for Discharge   Problem: Skin Integrity: Goal: Risk for impaired skin integrity will decrease Outcome: Adequate for Discharge   

## 2023-02-25 NOTE — Discharge Summary (Signed)
Physician Discharge Summary  Derek [name]:[id number] DOB: 12/07/1945 DOA: 02/23/2023  PCP: [facility]  Admit date: 02/23/2023 Discharge date: 02/25/2023  Admitted From: Home Disposition: Home  Recommendations for Outpatient Follow-up:  Follow up with PCP in 1-2 weeks Follow-up with urology in the next few days as a schedule your outpatient procedure.  Home Health: None Equipment/Devices: None indicated  Discharge Condition: Stable CODE STATUS: Full Diet recommendation: Low-salt low-fat low-carb diet  Brief/Interim Summary: Derek [name] is a 77 y.o. male with medical history significant for type 2 diabetes mellitus, OSA no longer using CPAP, CAD status post CABG, TIA, dementia, and recent admission for AKI who presents to the emergency department with intractable flank pain, nausea vomiting and poor p.o. intake.  Imaging consistent with nephrolithiasis that appear to be obstructing with severe hydronephrosis and perinephric stranding.  Hospitalist called for admission, urology called in consult.   Patient admitted as above with left ureteral obstructive nephrolithiasis with severe left hydronephrosis perinephric stranding intractable nausea vomiting and abdominal pain.  Patient symptoms drastically resolved over the initial few hours with need for IV fentanyl.  Remarkably patient has required no narcotics over the past 24 hours.  He appears to be tolerating p.o. well without any complaints.  Voiding bowel and bladder without issue.  Given patient's lack of use of narcotics it remains difficult to transition him to p.o. analgesics for discharge.  Will discharge with low-dose oxycodone, discussed at length with wife need for close monitoring. Follow up with urology as discussed.  Discharge Diagnoses:  Principal Problem:   Ureteral obstruction, left Active Problems:   Dementia without behavioral disturbance (HCC)   AKI (acute kidney injury) (HCC)   History of  TIA (transient ischemic attack)   Controlled NIDDM-2 with hyperlipidemia   CAD s/p CABG in 2019 at [facility]   Rheumatoid arthritis (HCC)   Prolonged QT interval  Left ureteral obstruction  Severe left hydronephrosis/perinephric stranding Intractable nausea, vomiting and abdominal pain - Urology - plan for intervention next week    AKI, improving CAD  Type II DM, well controlled Dementia  Prolonged QT interval   Discharge Instructions  Discharge Instructions     Call MD for:  severe uncontrolled pain   Complete by: As directed    Diet - low sodium heart healthy   Complete by: As directed    Increase activity slowly   Complete by: As directed       Allergies as of 02/25/2023   No Known Allergies      Medication List     TAKE these medications    allopurinol 300 MG tablet Commonly known as: ZYLOPRIM Take 300 mg by mouth daily.   aspirin EC 81 MG tablet Take 81 mg by mouth daily. Swallow whole.   cholecalciferol 25 MCG (1000 UNIT) tablet Commonly known as: VITAMIN D3 Take 1,000 Units by mouth daily.   feeding supplement Liqd Take 1 Container by mouth daily.   FLUoxetine 20 MG capsule Commonly known as: PROZAC Take 20 mg by mouth daily.   furosemide 20 MG tablet Commonly known as: LASIX Take 0.5 tablets (10 mg total) by mouth daily as needed for fluid or edema. What changed:  how much to take when to take this   metFORMIN 500 MG 24 hr tablet Commonly known as: GLUCOPHAGE-XR Take 1,000 mg by mouth daily with breakfast.   methotrexate 2.5 MG tablet Commonly known as: RHEUMATREX Take 10 mg by mouth 2 (two) times a week. Friday & Saturday  metoprolol succinate 25 MG 24 hr tablet Commonly known as: TOPROL-XL Take 12.5 mg by mouth every morning.   oxyCODONE 5 MG immediate release tablet Commonly known as: Oxy IR/ROXICODONE Take 1 tablet (5 mg total) by mouth every 4 (four) hours as needed for up to 7 days for severe pain or breakthrough pain.    rosuvastatin 20 MG tablet Commonly known as: CRESTOR Take 20 mg by mouth daily.        Follow-up Information     [facility] In 1 day.   Contact information: [address] [place] [address] [phone]               No Known Allergies  Consultations: Urology  Procedures/Studies: DG Abd 1 View  Result Date: 02/24/2023 CLINICAL DATA:  Ureteral calculus. EXAM: ABDOMEN - 1 VIEW COMPARISON:  CT of the abdomen and pelvis on 02/22/2023 FINDINGS: Bowel gas pattern is nonobstructive. There is significant stool burden throughout nondilated loops of colon. Intrarenal calculus projects over LEFT kidney, measuring 1.4 centimeters. LEFT ureteral stone projects LATERAL to the LEFT transverse process at L4 and measures 0.9 centimeters, consistent with known LEFT ureteral calculus. Remote RIGHT hip arthroplasty. Remote LEFT hip pinning. There is residual contrast in the urinary bladder. IMPRESSION: 1. LEFT intrarenal calculus. 2. LEFT ureteral calculus at L4. 3. Significant stool burden. Electronically Signed   By: Norva Pavlov M.D.   On: 02/24/2023 08:50   CT ABDOMEN PELVIS W CONTRAST  Result Date: 02/23/2023 CLINICAL DATA:  Abdominal pain EXAM: CT ABDOMEN AND PELVIS WITH CONTRAST TECHNIQUE: Multidetector CT imaging of the abdomen and pelvis was performed using the standard protocol following bolus administration of intravenous contrast. RADIATION DOSE REDUCTION: This exam was performed according to the departmental dose-optimization program which includes automated exposure control, adjustment of the mA and/or kV according to patient size and/or use of iterative reconstruction technique. CONTRAST:  60mL OMNIPAQUE IOHEXOL 350 MG/ML SOLN COMPARISON:  09/19/2008 FINDINGS: Lower chest: Small linear patchy infiltrates are seen in lower lung fields. Subpleural blebs are seen in the anterior lower lung fields, more so on the right side. Coronary artery  calcifications are seen. Metallic sutures are seen in sternum, possibly suggesting previous coronary bypass surgery. Hepatobiliary: Numerous small gallbladder stones are seen. There is no dilation of bile ducts. Pancreas: No focal abnormalities are seen. Spleen: Unremarkable. Adrenals/Urinary Tract: Adrenals are unremarkable. There is contrast in the pelvocaliceal system and proximal right ureter in the right kidney limiting evaluation for small nonobstructing stone. There is marked left hydronephrosis. There is marked left perinephric stranding. There is a 9 mm calculus in the proximal left ureter slightly below the level of ureteropelvic junction. There is a fluid around the left renal pelvis and proximal left ureter. There is 16 mm calculus in the upper pole of left kidney. Distal ureters are not dilated. Evaluation of urinary bladder is limited by beam hardening artifacts. Stomach/Bowel: Small hiatal hernia is seen. Stomach is not distended. Small bowel loops are not dilated. The appendix is not dilated. Moderate to large amount of stool is seen in ascending and transverse colon. Small amount of stool is seen in the rectosigmoid. Evaluation rectosigmoid is limited by artifacts. Vascular/Lymphatic: Coarse calcifications are seen in aorta and its major branches. Reproductive: Prostate appears to be enlarged in size. Possible calcifications are seen in seminal vesicles. Beam hardening artifacts limit evaluation of pelvic structures. Other: There is no ascites or pneumoperitoneum. Musculoskeletal: There is previous right hip arthroplasty. There  is previous internal fixation in left femur with 3 surgical screws in the head and neck. Degenerative changes are noted in lower lumbar spine. Last lumbar vertebra is transitional. IMPRESSION: There is a 9 mm calculus in the proximal left ureter causing high-grade obstruction with severe left hydronephrosis and marked left perinephric stranding. Perinephric stranding may be  due to high-grade ureteric obstruction or superimposed pyelonephritis. There is 16 mm calculus in the upper pole of left kidney. Small linear patchy infiltrates in the lower lung fields may suggest scarring or subsegmental atelectasis. There is no evidence of intestinal obstruction or pneumoperitoneum. Small hiatal hernia. Gallbladder stones. Arteriosclerosis. Coronary artery disease. Lumbar spondylosis. Other findings as described in the body of the report. Electronically Signed   By: Ernie Avena M.D.   On: 02/23/2023 20:56   DG Abd 1 View  Result Date: 02/22/2023 CLINICAL DATA:  Nausea and vomiting EXAM: ABDOMEN - 1 VIEW COMPARISON:  None Available. FINDINGS: Scattered large and small bowel gas is noted. No obstructive changes are seen. Postsurgical changes are noted in the hips bilaterally. No acute bony abnormality is seen. IMPRESSION: No acute abnormality noted. Electronically Signed   By: Alcide Clever M.D.   On: 02/22/2023 00:41   DG Chest 2 View  Result Date: 02/21/2023 CLINICAL DATA:  Dyspnea. EXAM: CHEST - 2 VIEW COMPARISON:  Chest radiographs 02/12/2023 and 02/25/2022 and 02/17/2022 FINDINGS: Status post median sternotomy. Cardiac silhouette is at the upper limits of normal size. Moderate atherosclerotic calcifications within the aortic arch. Mildly decreased lung volumes. Mild bibasilar subsegmental atelectasis. No pleural effusion pneumothorax. Mild multilevel degenerative disc changes of the thoracic spine. IMPRESSION: Mildly decreased lung volumes with mild bibasilar subsegmental atelectasis. Electronically Signed   By: Neita Garnet M.D.   On: 02/21/2023 23:21   ECHOCARDIOGRAM COMPLETE  Result Date: 02/13/2023    ECHOCARDIOGRAM REPORT   Patient Name:   Derek [name] Date of Exam: 02/13/2023 Medical Rec #:  [id number]           Height:       67.0 in Accession #:    [id number]          Weight:       169.8 lb Date of Birth:  Sep 22, 1946           BSA:          1.886 m Patient Age:     76 years            BP:           114/63 mmHg Patient Gender: M                   HR:           52 bpm. Exam Location:  Inpatient Procedure: 2D Echo, Color Doppler, Cardiac Doppler and Intracardiac            Opacification Agent Indications:    R55 Syncope  History:        Patient has prior history of Echocardiogram examinations, most                 recent 12/02/2017. Prior CABG; Risk Factors:Hypertension,                 Diabetes, Dyslipidemia and Sleep Apnea.  Sonographer:    Irving Burton Senior RDCS Referring Phys: Jonah Blue  Sonographer Comments: Definity used to assess for apical thrombus IMPRESSIONS  1. Left ventricular ejection fraction, by estimation, is 65 to 70%. The left  ventricle has normal function. The left ventricle has no regional wall motion abnormalities. Left ventricular diastolic parameters are consistent with Grade I diastolic dysfunction (impaired relaxation).  2. Right ventricular systolic function is low normal. The right ventricular size is normal. Tricuspid regurgitation signal is inadequate for assessing PA pressure.  3. The mitral valve is degenerative. Trivial mitral valve regurgitation. Moderate mitral annular calcification.  4. The aortic valve is tricuspid. There is moderate calcification of the aortic valve. There is moderate thickening of the aortic valve. Aortic valve regurgitation is mild. Mild aortic valve stenosis. Aortic valve mean gradient measures 11.0 mmHg. Aortic valve Vmax measures 2.30 m/s.  5. The inferior vena cava is normal in size with greater than 50% respiratory variability, suggesting right atrial pressure of 3 mmHg.  6. PFO present at time of CABG by intraoperative TEE report. Evidence of atrial level shunting detected by color flow Doppler. Comparison(s): Compared to prior echo report in 2019, there is no significant change. FINDINGS  Left Ventricle: Left ventricular ejection fraction, by estimation, is 65 to 70%. The left ventricle has normal function. The left  ventricle has no regional wall motion abnormalities. Definity contrast agent was given IV to delineate the left ventricular  endocardial borders. The left ventricular internal cavity size was normal in size. There is borderline left ventricular hypertrophy. Left ventricular diastolic parameters are consistent with Grade I diastolic dysfunction (impaired relaxation). Right Ventricle: The right ventricular size is normal. No increase in right ventricular wall thickness. Right ventricular systolic function is low normal. Tricuspid regurgitation signal is inadequate for assessing PA pressure. Left Atrium: Left atrial size was normal in size. Right Atrium: Right atrial size was normal in size. Pericardium: Trivial pericardial effusion is present. Presence of epicardial fat layer. Mitral Valve: The mitral valve is degenerative in appearance. There is moderate thickening of the mitral valve leaflet(s). There is moderate calcification of the mitral valve leaflet(s). Moderate mitral annular calcification. Trivial mitral valve regurgitation. Tricuspid Valve: The tricuspid valve is grossly normal. Tricuspid valve regurgitation is trivial. Aortic Valve: The aortic valve is tricuspid. There is moderate calcification of the aortic valve. There is moderate thickening of the aortic valve. Aortic valve regurgitation is mild. Mild aortic stenosis is present. Aortic valve mean gradient measures 11.0 mmHg. Aortic valve peak gradient measures 21.2 mmHg. Aortic valve area, by VTI measures 1.13 cm. Pulmonic Valve: The pulmonic valve was grossly normal. Pulmonic valve regurgitation is trivial. Aorta: The aortic root and ascending aorta are structurally normal, with no evidence of dilitation. Venous: The inferior vena cava is normal in size with greater than 50% respiratory variability, suggesting right atrial pressure of 3 mmHg. IAS/Shunts: Evidence of atrial level shunting detected by color flow Doppler.  LEFT VENTRICLE PLAX 2D LVIDd:          4.10 cm   Diastology LVIDs:         2.60 cm   LV e' medial:    7.29 cm/s LV PW:         1.00 cm   LV E/e' medial:  10.1 LV IVS:        1.10 cm   LV e' lateral:   8.92 cm/s LVOT diam:     1.80 cm   LV E/e' lateral: 8.3 LV SV:         53 LV SV Index:   28 LVOT Area:     2.54 cm  RIGHT VENTRICLE RV S prime:     11.30 cm/s TAPSE (M-mode): 1.4 cm  LEFT ATRIUM             Index        RIGHT ATRIUM           Index LA diam:        3.60 cm 1.91 cm/m   RA Area:     13.70 cm LA Vol (A2C):   32.5 ml 17.23 ml/m  RA Volume:   28.60 ml  15.16 ml/m LA Vol (A4C):   36.5 ml 19.35 ml/m LA Biplane Vol: 37.2 ml 19.72 ml/m  AORTIC VALVE AV Area (Vmax):    1.01 cm AV Area (Vmean):   1.15 cm AV Area (VTI):     1.13 cm AV Vmax:           230.00 cm/s AV Vmean:          140.000 cm/s AV VTI:            0.465 m AV Peak Grad:      21.2 mmHg AV Mean Grad:      11.0 mmHg LVOT Vmax:         91.40 cm/s LVOT Vmean:        63.000 cm/s LVOT VTI:          0.207 m LVOT/AV VTI ratio: 0.45  AORTA Ao Root diam: 3.00 cm Ao Asc diam:  3.50 cm MITRAL VALVE MV Area (PHT): 3.34 cm    SHUNTS MV Decel Time: 227 msec    Systemic VTI:  0.21 m MV E velocity: 73.90 cm/s  Systemic Diam: 1.80 cm MV A velocity: 95.70 cm/s MV E/A ratio:  0.77 Laurance Flatten MD Electronically signed by Laurance Flatten MD Signature Date/Time: 02/13/2023/2:19:34 PM    Final    EEG adult  Result Date: 02/12/2023 Charlsie Quest, MD     02/12/2023  6:54 PM Patient Name: Derek [name] MRN: [id number] Epilepsy Attending: Charlsie Quest Referring Physician/Provider: Jonah Blue, MD Date: 02/12/2023 Duration: 21.27 mins Patient history: 77yo M with syncope getting eeg to evaluate for seizure Level of alertness: Awake AEDs during EEG study: None Technical aspects: This EEG study was done with scalp electrodes positioned according to the 10-20 International system of electrode placement. Electrical activity was reviewed with band pass filter of 1-70Hz , sensitivity of 7  uV/mm, display speed of 57mm/sec with a 60Hz  notched filter applied as appropriate. EEG data were recorded continuously and digitally stored.  Video monitoring was available and reviewed as appropriate. Description: The posterior dominant rhythm consists of 8 Hz activity of moderate voltage (25-35 uV) seen predominantly in posterior head regions, symmetric and reactive to eye opening and eye closing. EEG showed intermittent 2-3hz  Hz theta-delta slowing in left and right temporal region. Physiologic photic driving was not seen during photic stimulation.  Hyperventilation was not performed.   ABNORMALITY - Intermittent slow, left and right temporal region IMPRESSION: This study is suggestive of non specific cortical dysfunction arising from left and right temporal region. No seizures or epileptiform discharges were seen throughout the recording. Charlsie Quest   DG Chest Port 1 View  Result Date: 02/12/2023 CLINICAL DATA:  Syncope. EXAM: PORTABLE CHEST 1 VIEW COMPARISON:  Chest radiographs dated Feb 25, 2022 FINDINGS: The heart is enlarged with evidence of prior coronary artery bypass grafting. Low lung volumes with left basilar opacity suggesting atelectasis and/or small effusion. Thoracic spondylosis. IMPRESSION: Low lung volumes with left basilar opacity suggesting atelectasis and/or small effusion. Stable cardiomegaly. Electronically Signed   By: Larose Hires  D.O.   On: 02/12/2023 11:45   CT Head Wo Contrast  Result Date: 02/12/2023 CLINICAL DATA:  77 year old male with altered mental status. EXAM: CT HEAD WITHOUT CONTRAST TECHNIQUE: Contiguous axial images were obtained from the base of the skull through the vertex without intravenous contrast. RADIATION DOSE REDUCTION: This exam was performed according to the departmental dose-optimization program which includes automated exposure control, adjustment of the mA and/or kV according to patient size and/or use of iterative reconstruction technique.  COMPARISON:  None Available. FINDINGS: Brain: No midline shift, ventriculomegaly, mass effect, evidence of mass lesion, intracranial hemorrhage or evidence of cortically based acute infarction. Confluent and widespread bilateral cerebral white matter hypodensity. Small area of chronic appearing cortical encephalomalacia right middle frontal gyrus (series 5, image 26). Deep gray matter nuclei, brainstem and cerebellum relatively spared. Vascular: Calcified atherosclerosis at the skull base. No suspicious intracranial vascular hyperdensity. Skull: No acute osseous abnormality identified. Sinuses/Orbits: Scattered mild paranasal sinus mucosal thickening or small mucous retention cysts. Tympanic cavities and mastoids are clear. Other: Calcified scalp vessel atherosclerosis. No acute orbit or scalp soft tissue finding. IMPRESSION: 1. No acute intracranial abnormality identified. 2. 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 3. Advanced calcified atherosclerosis, including scalp vessel involvement frequently seen with End stage renal disease. Electronically Signed   By: Odessa Fleming M.D.   On: 02/12/2023 11:29     Subjective: No acute issues or events overnight denies nausea vomiting diarrhea constipation headache fevers chills or chest pain.  Notable hiccups this morning but otherwise asymptomatic   Discharge Exam: Vitals:   02/24/23 2133 02/25/23 0516  BP: 118/66 122/63  Pulse: 65 70  Resp:    Temp: 98.6 F (37 C) 98 F (36.7 C)  SpO2: 98% 96%   Vitals:   02/24/23 1133 02/24/23 1532 02/24/23 2133 02/25/23 0516  BP: 120/68 (!) 141/87 118/66 122/63  Pulse: 64 65 65 70  Resp: 18 18    Temp: 98.4 F (36.9 C) 98.7 F (37.1 C) 98.6 F (37 C) 98 F (36.7 C)  TempSrc: Oral Oral Oral Oral  SpO2: 97% 100% 98% 96%  Weight:      Height:        General: Pt is alert, awake, not in acute distress Cardiovascular: RRR, S1/S2 +, no rubs, no  gallops Respiratory: CTA bilaterally, no wheezing, no rhonchi Abdominal: Soft, NT, ND, bowel sounds + Extremities: no edema, no cyanosis    The results of significant diagnostics from this hospitalization (including imaging, microbiology, ancillary and laboratory) are listed below for reference.       Labs: Basic Metabolic Panel: Recent Labs  Lab 02/21/23 1850 02/22/23 0048 02/23/23 1642 02/24/23 0449 02/25/23 0502  NA 131* 132* 132* 133* 137  K 3.9 4.1 4.8 3.7 3.5  CL 98 101 97* 100 105  CO2 21* 21* 23 22 25   GLUCOSE 162* 155* 187* 135* 91  BUN 18 17 21 21 21   CREATININE 1.64* 1.49* 1.52* 1.30* 1.09  CALCIUM 9.2 9.1 9.5 8.6* 8.6*  MG  --   --   --  1.7  --    Liver Function Tests: Recent Labs  Lab 02/21/23 1850 02/23/23 1642 02/24/23 0449  AST 46* 60* 34  ALT 30 36 28  ALKPHOS 66 74 57  BILITOT 0.9 1.7* 1.1  PROT 8.1 8.0 7.2  ALBUMIN 3.5 3.3* 3.0*   Recent Labs  Lab 02/21/23 2325 02/23/23 1642  LIPASE 32 35   No  results for input(s): "AMMONIA" in the last 168 hours. CBC: Recent Labs  Lab 02/21/23 1850 02/22/23 0048 02/23/23 1642 02/24/23 0449 02/25/23 0502  WBC 8.9 9.9 9.4 9.5 8.2  HGB 14.7 14.2 15.2 13.4 12.4*  HCT 43.5 41.7 44.2 39.4 36.8*  MCV 103.3* 101.7* 102.8* 102.9* 103.7*  PLT 292 247 288 246 214   CBG: Recent Labs  Lab 02/24/23 2046 02/24/23 2136 02/25/23 0013 02/25/23 0517 02/25/23 0756  GLUCAP 129* 132* 82 86 96   Hgb A1c Recent Labs    02/24/23 0449  HGBA1C 5.7*   Urinalysis    Component Value Date/Time   COLORURINE YELLOW 02/23/2023 2235   APPEARANCEUR CLEAR 02/23/2023 2235   LABSPEC 1.036 (H) 02/23/2023 2235   PHURINE 6.0 02/23/2023 2235   GLUCOSEU NEGATIVE 02/23/2023 2235   HGBUR SMALL (A) 02/23/2023 2235   BILIRUBINUR NEGATIVE 02/23/2023 2235   KETONESUR NEGATIVE 02/23/2023 2235   PROTEINUR 100 (A) 02/23/2023 2235   UROBILINOGEN 1.0 06/06/2012 1037   NITRITE NEGATIVE 02/23/2023 2235   LEUKOCYTESUR  NEGATIVE 02/23/2023 2235   Sepsis Labs Recent Labs  Lab 02/22/23 0048 02/23/23 1642 02/24/23 0449 02/25/23 0502  WBC 9.9 9.4 9.5 8.2    Time coordinating discharge: Over 30 minutes  SIGNED:   Azucena Fallen, DO [facility] 02/25/2023, 1:54 PM Pager   If 7PM-7AM, please contact night-coverage [email or web address]

## 2023-03-07 NOTE — Progress Notes (Addendum)
COVID Vaccine received:  []  No [x]  Yes Date of any COVID positive Test in last 90 days:  None  PCP - [facility] Cardi[facility] -  sees [place] [facility],  Rheumatology-   Dr. Azucena Fallen  Chest x-ray - 02-21-2023  2v  Epic EKG -  02-24-2023  Epic Stress Test -  ECHO - 02-13-2023  Epic Cardiac Cath -   PCR screen: []  Ordered & Completed           []   No Order but Needs PROFEND           [x]   N/A for this surgery  Surgery Plan:  [x]  Ambulatory                            []  Outpatient in bed                            []  Admit  Anesthesia:    [x]  General  []  Spinal                           []   Choice []   MAC  Bowel Prep - [x]  No  []   Yes ______  Pacemaker / ICD device [x]  No []  Yes   Spinal Cord Stimulator:[x]  No []  Yes       History of Sleep Apnea? []  No [x]  Yes   CPAP used?- [x]  No []  Yes    Does the patient monitor blood sugar?          [x]  No []  Yes  []  N/A  Patient has: []  NO Hx DM   []  Pre-DM                 []  DM1  [x]   DM2 Last A1c while hospitalized 02-24-2023  was 5.7 Does patient have a [employer] or Dexacom? [x]  No []  Yes   Fasting Blood Sugar Ranges-  Checks Blood Sugar _0_ times a day Metformin 1000mg  q am,  hold DOS  Per Dr. Laverle Patter, Patient will not take Methotrexate on June 1 or March 13, 2023.   Blood Thinner / Instructions:none Aspirin Instructions:  ASA 81 mg  Patient to stop today per wife  ERAS Protocol Ordered: [x]  No  []  Yes Patient is to be NPO after: midnight prior  Comments: Patient's wife, Dewayne Hatch, is his POA and she signed his consent (he requested that she sign). All questions were asked and answered; both the patient and his wife voiced understanding of the contents of the Surgery Consent.   Activity level: Patient is unable to climb a flight of stairs without difficulty;  Patient can not perform ADLs without assistance.   Anesthesia review: DM2, HTN, CAD-CABG 2019 at [facility], OSA- no CPAP, TIA, Dementia, RA, glaucoma, Remote hx  sarcoidosis, PTSD  Patient denies shortness of breath, fever, cough and chest pain at PAT appointment.  Patient verbalized understanding and agreement to the Pre-Surgical Instructions that were given to them at this PAT appointment. Patient was also educated of the need to review these PAT instructions again prior to his surgery.I reviewed the appropriate phone numbers to call if they have any and questions or concerns.

## 2023-03-07 NOTE — Patient Instructions (Addendum)
SURGICAL WAITING ROOM VISITATION Patients having surgery or a procedure may have no more than 2 support people in the waiting area - these visitors may rotate in the visitor waiting room.   Due to an increase in RSV and influenza rates and associated hospitalizations, children ages 31 and under may not visit patients in [facility] hospitals. If the patient needs to stay at the hospital during part of their recovery, the visitor guidelines for inpatient rooms apply.  PRE-OP VISITATION  Pre-op nurse will coordinate an appropriate time for 1 support person to accompany the patient in pre-op.  This support person may not rotate.  This visitor will be contacted when the time is appropriate for the visitor to come back in the pre-op area.  Please refer to the [facility] website for the visitor guidelines for Inpatients (after your surgery is over and you are in a regular room).  You are not required to quarantine at this time prior to your surgery. However, you must do this: Hand Hygiene often Do NOT share personal items Notify your provider if you are in close contact with someone who has COVID or you develop fever 100.4 or greater, new onset of sneezing, cough, sore throat, shortness of breath or body aches.  If you test positive for Covid or have been in contact with anyone that has tested positive in the last 10 days please notify you surgeon.    Your procedure is scheduled on:  Monday  March 14, 2023  Report to [facility] Main Entrance: Leota Jacobsen entrance where the [employer] is available.   Report to admitting at:  1:00   PM  +++++Call this number if you have any questions or problems the morning of surgery [phone]  DO NOT EAT OR DRINK ANYTHING AFTER MIDNIGHT THE NIGHT PRIOR TO YOUR SURGERY / PROCEDURE.   FOLLOW BOWEL PREP AND ANY ADDITIONAL PRE OP INSTRUCTIONS YOU RECEIVED FROM YOUR SURGEON'S OFFICE!!!   Oral Hygiene is also important to reduce your risk of infection.         Remember - BRUSH YOUR TEETH THE MORNING OF SURGERY WITH YOUR REGULAR TOOTHPASTE  Do NOT smoke after Midnight the night before surgery.  Methotrexate- Do not take 03-12-23 or 03-13-2023  Take ONLY these medicines the morning of surgery with A SIP OF WATER: Fluoxetine (Prozac), metoprolol, Allopurinol.                      You may not have any metal on your body including  jewelry, and body piercing  Do not wear  lotions, powders, cologne, or deodorant  Men may shave face and neck.  Contacts, Hearing Aids, dentures or bridgework may not be worn into surgery. DENTURES WILL BE REMOVED PRIOR TO SURGERY PLEASE DO NOT APPLY "Poly grip" OR ADHESIVES!!!  You may bring a small overnight bag with you on the day of surgery, only pack items that are not valuable. [facility] IS NOT RESPONSIBLE   FOR VALUABLES THAT ARE LOST OR STOLEN.   Do not bring your home medications to the hospital. The Pharmacy will dispense medications listed on your medication list to you during your admission in the Hospital.  Special Instructions: Bring a copy of your healthcare power of attorney and living will documents the day of surgery, if you wish to have them scanned into your [facility] Records- EPIC  Please read over the following fact sheets you were given: IF YOU HAVE QUESTIONS ABOUT YOUR  PRE-OP INSTRUCTIONS, PLEASE CALL [phone].   [facility] - Preparing for Surgery Before surgery, you can play an important role.  Because skin is not sterile, your skin needs to be as free of germs as possible.  You can reduce the number of germs on your skin by washing with CHG (chlorahexidine gluconate) soap before surgery.  CHG is an antiseptic cleaner which kills germs and bonds with the skin to continue killing germs even after washing. Please DO NOT use if you have an allergy to CHG or antibacterial soaps.  If your skin becomes reddened/irritated stop using the CHG and inform your nurse when you arrive at  Short Stay. Do not shave (including legs and underarms) for at least 48 hours prior to the first CHG shower.  You may shave your face/neck.  Please follow these instructions carefully:  1.  Shower with CHG Soap the night before surgery and the  morning of surgery.  2.  If you choose to wash your hair, wash your hair first as usual with your normal  shampoo.  3.  After you shampoo, rinse your hair and body thoroughly to remove the shampoo.                             4.  Use CHG as you would any other liquid soap.  You can apply chg directly to the skin and wash.  Gently with a scrungie or clean washcloth.  5.  Apply the CHG Soap to your body ONLY FROM THE NECK DOWN.   Do not use on face/ open                           Wound or open sores. Avoid contact with eyes, ears mouth and genitals (private parts).                       Wash face,  Genitals (private parts) with your normal soap.             6.  Wash thoroughly, paying special attention to the area where your  surgery  will be performed.  7.  Thoroughly rinse your body with warm water from the neck down.  8.  DO NOT shower/wash with your normal soap after using and rinsing off the CHG Soap.            9.  Pat yourself dry with a clean towel.            10.  Wear clean pajamas.            11.  Place clean sheets on your bed the night of your first shower and do not  sleep with pets.  ON THE DAY OF SURGERY : Do not apply any lotions/deodorants the morning of surgery.  Please wear clean clothes to the hospital/surgery center.    FAILURE TO FOLLOW THESE INSTRUCTIONS MAY RESULT IN THE CANCELLATION OF YOUR SURGERY  PATIENT SIGNATURE_________________________________  NURSE SIGNATURE__________________________________  ________________________________________________________________________

## 2023-03-09 ENCOUNTER — Encounter (HOSPITAL_COMMUNITY)
Admission: RE | Admit: 2023-03-09 | Discharge: 2023-03-09 | Disposition: A | Discharge status: Home / Self Care | Payer: Medicare PPO | Source: Ambulatory Visit | Attending: Urology | Admitting: Urology

## 2023-03-09 ENCOUNTER — Other Ambulatory Visit: Payer: Self-pay

## 2023-03-09 ENCOUNTER — Encounter (HOSPITAL_COMMUNITY): Payer: Self-pay

## 2023-03-09 VITALS — BP 115/75 | HR 58 | Temp 98.0°F | Resp 18 | Ht 67.0 in | Wt 162.0 lb

## 2023-03-09 DIAGNOSIS — Z951 Presence of aortocoronary bypass graft: Secondary | ICD-10-CM | POA: Diagnosis not present

## 2023-03-09 DIAGNOSIS — F0390 Unspecified dementia without behavioral disturbance: Secondary | ICD-10-CM | POA: Diagnosis not present

## 2023-03-09 DIAGNOSIS — I10 Essential (primary) hypertension: Secondary | ICD-10-CM | POA: Insufficient documentation

## 2023-03-09 DIAGNOSIS — R7303 Prediabetes: Secondary | ICD-10-CM | POA: Diagnosis not present

## 2023-03-09 DIAGNOSIS — I2510 Atherosclerotic heart disease of native coronary artery without angina pectoris: Secondary | ICD-10-CM | POA: Diagnosis not present

## 2023-03-09 DIAGNOSIS — G4730 Sleep apnea, unspecified: Secondary | ICD-10-CM | POA: Insufficient documentation

## 2023-03-09 DIAGNOSIS — N200 Calculus of kidney: Secondary | ICD-10-CM | POA: Diagnosis not present

## 2023-03-09 DIAGNOSIS — Z01812 Encounter for preprocedural laboratory examination: Secondary | ICD-10-CM | POA: Insufficient documentation

## 2023-03-09 DIAGNOSIS — Z8673 Personal history of transient ischemic attack (TIA), and cerebral infarction without residual deficits: Secondary | ICD-10-CM | POA: Insufficient documentation

## 2023-03-09 HISTORY — DX: Unspecified glaucoma: H40.9

## 2023-03-09 HISTORY — DX: Pneumonia, unspecified organism: J18.9

## 2023-03-09 HISTORY — DX: Cerebral infarction, unspecified: I63.9

## 2023-03-09 HISTORY — DX: Prediabetes: R73.03

## 2023-03-09 LAB — BASIC METABOLIC PANEL
Anion gap: 6 (ref 5–15)
BUN: 12 mg/dL (ref 8–23)
CO2: 28 mmol/L (ref 22–32)
Calcium: 9.1 mg/dL (ref 8.9–10.3)
Chloride: 102 mmol/L (ref 98–111)
Creatinine, Ser: 1.1 mg/dL (ref 0.61–1.24)
GFR, Estimated: >60 mL/min (ref >60)
Glucose, Bld: 117 mg/dL — ABNORMAL HIGH (ref 70–99)
Potassium: 3.8 mmol/L (ref 3.5–5.1)
Sodium: 136 mmol/L (ref 135–145)

## 2023-03-09 LAB — CBC
HCT: 41.4 % (ref 39.0–52.0)
Hemoglobin: 13.7 g/dL (ref 13.0–17.0)
MCH: 34.6 pg — ABNORMAL HIGH (ref 26.0–34.0)
MCHC: 33.1 g/dL (ref 30.0–36.0)
MCV: 104.5 fL — ABNORMAL HIGH (ref 80.0–100.0)
Platelets: 344 10*3/uL (ref 150–400)
RBC: 3.96 MIL/uL — ABNORMAL LOW (ref 4.22–5.81)
RDW: 13.2 % (ref 11.5–15.5)
WBC: 7.1 10*3/uL (ref 4.0–10.5)
nRBC: 0 % (ref 0.0–0.2)

## 2023-03-09 LAB — GLUCOSE, CAPILLARY: Glucose-Capillary: 110 mg/dL — ABNORMAL HIGH (ref 70–99)

## 2023-03-10 NOTE — Progress Notes (Addendum)
Anesthesia Chart Review   Case: [id number] Date/Time: 03/14/23 1500   Procedure: CYSTOSCOPY/LEFT RETROGRADE PYELOGRAM/ LEFT URETEROSCOPY/HOLMIUM LASER/LEFT URETERAL STENT PLACEMENT (Left) - 60 MINUTES NEEDED FOR CASE   Anesthesia type: General   Pre-op diagnosis: LEFT URETERAL AND RENAL CALCULI   Location: [facility] ROOM 03 / [facility]   Surgeons: Heloise Purpura, MD       DISCUSSION:77 y.o. never smoker with h/o HTN, sleep apnea, CAD (CABG), TIA, dementia, left ureteral and renal calculi scheduled for above procedure 03/14/2023 with Dr. Heloise Purpura.   Pt with several recent admissions due to AKI.   Admission 5/4-02/13/2023 due to syncope, seizure vs. Vasovagal. EEG with no seizures noted, Echo normal, CT head normal. Troponins negative during admission, no chest pain, bp well controlled.   Pt follows with cardiology at the [facility] 03/02/2023.  Letter received from cardiology which states, "this letter is regarding mutual patient Derek [name] (DOB [birth year]).  He is under the care of the cardiology clinic at the [facility] in [place], [place], primarily for history of coronary disease with previous multiple vessel bypass 11/2017. He has been a bit lost to follow-up, last visit with [place] was 07/30/2021.  I contacted him by phone today for routine follow-up.  He states he is generally doing well, no recent cardiac issues.  No recent hospitalizations.  He has no complaints of chest pain or shortness of breath. While at modestly increased risk of perioperative complication due to his cardiac history, he can proceed with urologic procedure without further preoperative testing.  I would prefer he continue his aspirin throughout the perioperative period, however if required he can hold it for 5 days prior to procedure." VS: BP 115/75 Comment: right arm sitting  Pulse (!) 58 Comment: none bradycardia  Temp 36.7 C (Oral)   Resp 18   Ht 5\' 7"  (1.702 m)   Wt 73.5 kg   SpO2 100%   BMI 25.37 kg/m    PROVIDERS: Clinic, [facility]   LABS: Labs reviewed: Acceptable for surgery. (all labs ordered are listed, but only abnormal results are displayed)  Labs Reviewed  BASIC METABOLIC PANEL - Abnormal; Notable for the following components:      Result Value   Glucose, Bld 117 (*)    All other components within normal limits  CBC - Abnormal; Notable for the following components:   RBC 3.96 (*)    MCV 104.5 (*)    MCH 34.6 (*)    All other components within normal limits  GLUCOSE, CAPILLARY - Abnormal; Notable for the following components:   Glucose-Capillary 110 (*)    All other components within normal limits     IMAGES:   EKG:   CV: Echo 02/13/2023 1. Left ventricular ejection fraction, by estimation, is 65 to 70%. The  left ventricle has normal function. The left ventricle has no regional  wall motion abnormalities. Left ventricular diastolic parameters are  consistent with Grade I diastolic  dysfunction (impaired relaxation).   2. Right ventricular systolic function is low normal. The right  ventricular size is normal. Tricuspid regurgitation signal is inadequate  for assessing PA pressure.   3. The mitral valve is degenerative. Trivial mitral valve regurgitation.  Moderate mitral annular calcification.   4. The aortic valve is tricuspid. There is moderate calcification of the  aortic valve. There is moderate thickening of the aortic valve. Aortic  valve regurgitation is mild. Mild aortic valve stenosis. Aortic valve mean  gradient measures 11.0 mmHg.  Aortic valve Vmax  measures 2.30 m/s.   5. The inferior vena cava is normal in size with greater than 50%  respiratory variability, suggesting right atrial pressure of 3 mmHg.   6. PFO present at time of CABG by intraoperative TEE report. Evidence of  atrial level shunting detected by color flow Doppler.  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   Glaucoma    H/O: upper GI bleed 01/2001   related to nonsteroidal use, CLO test +(Med Tx- Prilosec, Amoxicillin, & Biaxin)   Hypertension    Insomnia    Mental disorder    PTSD   Peptic ulcer disease    history of gastric ulcer   Pneumonia    Pre-diabetes    PTSD (post-traumatic stress disorder)    [place]   Sarcoidosis    late 1970s   Scleritis    Sleep apnea    cpap with O2 4L for 4 yrs   Stroke [facility])    numerous TIAs   Tubular adenoma 2004    Past Surgical History:  Procedure Laterality Date   CARDIAC CATHETERIZATION     CORONARY ARTERY BYPASS GRAFT  12/02/2017   at [facility] Dr Ty Hilts Surgeon   EYE SURGERY  2005   growth lft   HIP PINNING,CANNULATED Left 02/12/2022   Procedure: CANNULATED HIP PINNING;  Surgeon: Terance Hart, MD;  Location: [facility];  Service: Orthopedics;  Laterality: Left;   TOTAL HIP ARTHROPLASTY  06/13/2012   Procedure: TOTAL HIP ARTHROPLASTY ANTERIOR APPROACH;  Surgeon: Velna Ochs, MD;  Location: [facility];  Service: Orthopedics;  Laterality: Right;  right anterior approach total hip arthroplasty    MEDICATIONS:  allopurinol (ZYLOPRIM) 300 MG tablet   aspirin EC 81 MG tablet   cholecalciferol (VITAMIN D3) 25 MCG (1000 UNIT) tablet   feeding supplement (BOOST HIGH PROTEIN) LIQD   FLUoxetine (PROZAC) 20 MG capsule   furosemide (LASIX) 20 MG tablet   metFORMIN (GLUCOPHAGE-XR) 500 MG 24 hr tablet   methotrexate (RHEUMATREX) 2.5 MG tablet   metoprolol succinate (TOPROL-XL) 25 MG 24 hr tablet   rosuvastatin (CRESTOR) 20 MG tablet   No current facility-administered medications for this encounter.     [name], PA-C [facility]-Surgical [facility] [phone]

## 2023-03-11 NOTE — Anesthesia Preprocedure Evaluation (Signed)
Anesthesia Evaluation  Patient identified by MRN, date of birth, ID band Patient awake    Reviewed: Allergy & Precautions, NPO status , Patient's Chart, lab work & pertinent test results  Airway Mallampati: II  TM Distance: >3 FB Neck ROM: Full    Dental no notable dental hx. (+) Teeth Intact, Dental Advisory Given   Pulmonary sleep apnea and Continuous Positive Airway Pressure Ventilation    Pulmonary exam normal breath sounds clear to auscultation       Cardiovascular hypertension, + CAD and + CABG (CABG x 2    2021)  Normal cardiovascular exam+ dysrhythmias Atrial Fibrillation  Rhythm:Regular Rate:Normal  Echo 06/01/2019 1. The left ventricle has mild-moderately reduced systolic function, with  an ejection fraction of 40-45%.    Neuro/Psych negative neurological ROS     GI/Hepatic negative GI ROS, Neg liver ROS,,,  Endo/Other  negative endocrine ROS    Renal/GU Lab Results      Component                Value               Date               4                HGB                      12.5 (L)            03/09/2023                HCT                      38.6 (L)            03/09/2023                     PLT                      111 (L)             03/09/2023             negative genitourinary   Musculoskeletal  (+) Arthritis , Osteoarthritis,    Abdominal  (+) + obese  Peds  Hematology Lab Results      Component                Value               Date                            HGB                      12.5 (L)            03/09/2023                HCT                      38.6 (L)            03/09/2023                 PLT                      111 (L)               03/09/2023              Anesthesia Other Findings All: Meloxicam  Reproductive/Obstetrics                             Anesthesia Physical Anesthesia Plan  ASA: 3  Anesthesia Plan: Spinal and Regional   Post-op Pain  Management: Regional block* and Minimal or no pain anticipated   Induction:   PONV Risk Score and Plan: Treatment may vary due to age or medical condition, Propofol infusion and Midazolam  Airway Management Planned: Simple Face Mask and Nasal Cannula  Additional Equipment: None  Intra-op Plan:   Post-operative Plan:   Informed Consent: I have reviewed the patients History and Physical, chart, labs and discussed the procedure including the risks, benefits and alternatives for the proposed anesthesia with the patient or authorized representative who has indicated his/her understanding and acceptance.     Dental advisory given  Plan Discussed with: CRNA  Anesthesia Plan Comments: (See PAT note 03/09/2023 Sp w L adductor )       Anesthesia Quick Evaluation  

## 2023-03-14 ENCOUNTER — Ambulatory Visit (HOSPITAL_COMMUNITY)
Admission: RE | Admit: 2023-03-14 | Discharge: 2023-03-14 | Disposition: A | Discharge status: Home / Self Care | Payer: Medicare PPO | Attending: Urology | Admitting: Urology

## 2023-03-14 ENCOUNTER — Ambulatory Visit (HOSPITAL_BASED_OUTPATIENT_CLINIC_OR_DEPARTMENT_OTHER): Payer: Medicare PPO | Admitting: Certified Registered"

## 2023-03-14 ENCOUNTER — Encounter (HOSPITAL_COMMUNITY): Payer: Self-pay | Admitting: Urology

## 2023-03-14 ENCOUNTER — Ambulatory Visit (HOSPITAL_COMMUNITY): Payer: Medicare PPO | Admitting: Physician Assistant

## 2023-03-14 ENCOUNTER — Ambulatory Visit (HOSPITAL_COMMUNITY): Payer: Medicare PPO

## 2023-03-14 ENCOUNTER — Encounter (HOSPITAL_COMMUNITY)
Admission: RE | Disposition: A | Discharge status: Home / Self Care | Payer: Self-pay | Source: Home / Self Care | Attending: Urology

## 2023-03-14 ENCOUNTER — Other Ambulatory Visit: Payer: Self-pay

## 2023-03-14 DIAGNOSIS — N200 Calculus of kidney: Secondary | ICD-10-CM | POA: Diagnosis present

## 2023-03-14 DIAGNOSIS — Z8711 Personal history of peptic ulcer disease: Secondary | ICD-10-CM | POA: Insufficient documentation

## 2023-03-14 DIAGNOSIS — Z7984 Long term (current) use of oral hypoglycemic drugs: Secondary | ICD-10-CM | POA: Insufficient documentation

## 2023-03-14 DIAGNOSIS — D869 Sarcoidosis, unspecified: Secondary | ICD-10-CM | POA: Insufficient documentation

## 2023-03-14 DIAGNOSIS — I10 Essential (primary) hypertension: Secondary | ICD-10-CM | POA: Diagnosis not present

## 2023-03-14 DIAGNOSIS — E119 Type 2 diabetes mellitus without complications: Secondary | ICD-10-CM | POA: Diagnosis not present

## 2023-03-14 DIAGNOSIS — F4310 Post-traumatic stress disorder, unspecified: Secondary | ICD-10-CM | POA: Diagnosis not present

## 2023-03-14 DIAGNOSIS — F0390 Unspecified dementia without behavioral disturbance: Secondary | ICD-10-CM | POA: Diagnosis not present

## 2023-03-14 DIAGNOSIS — F418 Other specified anxiety disorders: Secondary | ICD-10-CM | POA: Insufficient documentation

## 2023-03-14 DIAGNOSIS — I2510 Atherosclerotic heart disease of native coronary artery without angina pectoris: Secondary | ICD-10-CM | POA: Diagnosis not present

## 2023-03-14 DIAGNOSIS — M069 Rheumatoid arthritis, unspecified: Secondary | ICD-10-CM | POA: Insufficient documentation

## 2023-03-14 DIAGNOSIS — R7303 Prediabetes: Secondary | ICD-10-CM

## 2023-03-14 HISTORY — PX: CYSTOSCOPY/URETEROSCOPY/HOLMIUM LASER/STENT PLACEMENT: SHX6546

## 2023-03-14 LAB — GLUCOSE, CAPILLARY: Glucose-Capillary: 90 mg/dL (ref 70–99)

## 2023-03-14 SURGERY — CYSTOSCOPY/URETEROSCOPY/HOLMIUM LASER/STENT PLACEMENT
Anesthesia: General | Site: Bladder | Laterality: Left

## 2023-03-14 MED ORDER — ACETAMINOPHEN 500 MG PO TABS
1000.0000 mg | ORAL_TABLET | Freq: Once | ORAL | Status: AC
  Administered 2023-03-14: 1000 mg via ORAL

## 2023-03-14 MED ORDER — FENTANYL CITRATE PF 50 MCG/ML IJ SOSY: 25.0000 ug | PREFILLED_SYRINGE | INTRAMUSCULAR | Status: DC | PRN

## 2023-03-14 MED ORDER — AMISULPRIDE (ANTIEMETIC) 5 MG/2ML IV SOLN: 10.0000 mg | Freq: Once | INTRAVENOUS | Status: DC | PRN

## 2023-03-14 MED ORDER — LIDOCAINE HCL URETHRAL/MUCOSAL 2 % EX GEL
CUTANEOUS | Status: AC
  Filled 2023-03-14: qty 5

## 2023-03-14 MED ORDER — CEFAZOLIN SODIUM-DEXTROSE 2-4 GM/100ML-% IV SOLN
INTRAVENOUS | Status: AC
  Filled 2023-03-14: qty 100

## 2023-03-14 MED ORDER — EPHEDRINE 5 MG/ML INJ
INTRAVENOUS | Status: AC
  Filled 2023-03-14: qty 5

## 2023-03-14 MED ORDER — ACETAMINOPHEN 500 MG PO TABS
ORAL_TABLET | ORAL | Status: AC
  Filled 2023-03-14: qty 2

## 2023-03-14 MED ORDER — PROPOFOL 10 MG/ML IV BOLUS
INTRAVENOUS | Status: AC
  Filled 2023-03-14: qty 20

## 2023-03-14 MED ORDER — DEXAMETHASONE SODIUM PHOSPHATE 10 MG/ML IJ SOLN
INTRAMUSCULAR | Status: DC | PRN
  Administered 2023-03-14: 4 mg via INTRAVENOUS

## 2023-03-14 MED ORDER — CHLORHEXIDINE GLUCONATE 0.12 % MT SOLN
15.0000 mL | Freq: Once | OROMUCOSAL | Status: AC
  Administered 2023-03-14: 15 mL via OROMUCOSAL

## 2023-03-14 MED ORDER — OXYCODONE HCL 5 MG/5ML PO SOLN: 5.0000 mg | Freq: Once | ORAL | Status: DC | PRN

## 2023-03-14 MED ORDER — PROPOFOL 10 MG/ML IV BOLUS
INTRAVENOUS | Status: DC | PRN
  Administered 2023-03-14: 150 mg via INTRAVENOUS
  Administered 2023-03-14: 40 mg via INTRAVENOUS

## 2023-03-14 MED ORDER — OXYCODONE HCL 5 MG PO TABS: 5.0000 mg | ORAL_TABLET | Freq: Once | ORAL | Status: DC | PRN

## 2023-03-14 MED ORDER — DEXAMETHASONE SODIUM PHOSPHATE 10 MG/ML IJ SOLN
INTRAMUSCULAR | Status: AC
  Filled 2023-03-14: qty 1

## 2023-03-14 MED ORDER — FENTANYL CITRATE (PF) 100 MCG/2ML IJ SOLN
INTRAMUSCULAR | Status: AC
  Filled 2023-03-14: qty 2

## 2023-03-14 MED ORDER — LIDOCAINE HCL (PF) 2 % IJ SOLN
INTRAMUSCULAR | Status: AC
  Filled 2023-03-14: qty 5

## 2023-03-14 MED ORDER — CEFAZOLIN SODIUM-DEXTROSE 2-4 GM/100ML-% IV SOLN
2.0000 g | INTRAVENOUS | Status: AC
  Administered 2023-03-14: 2 g via INTRAVENOUS

## 2023-03-14 MED ORDER — LIDOCAINE 2% (20 MG/ML) 5 ML SYRINGE
INTRAMUSCULAR | Status: DC | PRN
  Administered 2023-03-14: 80 mg via INTRAVENOUS

## 2023-03-14 MED ORDER — FENTANYL CITRATE (PF) 100 MCG/2ML IJ SOLN
INTRAMUSCULAR | Status: DC | PRN
  Administered 2023-03-14 (×3): 25 ug via INTRAVENOUS

## 2023-03-14 MED ORDER — SODIUM CHLORIDE 0.9 % IR SOLN
Status: DC | PRN
  Administered 2023-03-14 (×2): 3000 mL via INTRAVESICAL

## 2023-03-14 MED ORDER — ORAL CARE MOUTH RINSE: 15.0000 mL | Freq: Once | OROMUCOSAL | Status: AC

## 2023-03-14 MED ORDER — ROCURONIUM BROMIDE 10 MG/ML (PF) SYRINGE
PREFILLED_SYRINGE | INTRAVENOUS | Status: AC
  Filled 2023-03-14: qty 10

## 2023-03-14 MED ORDER — LACTATED RINGERS IV SOLN: INTRAVENOUS | Status: DC

## 2023-03-14 MED ORDER — ONDANSETRON HCL 4 MG/2ML IJ SOLN
INTRAMUSCULAR | Status: DC | PRN
  Administered 2023-03-14: 4 mg via INTRAVENOUS

## 2023-03-14 MED ORDER — ONDANSETRON HCL 4 MG/2ML IJ SOLN
INTRAMUSCULAR | Status: AC
  Filled 2023-03-14: qty 2

## 2023-03-14 MED ORDER — EPHEDRINE SULFATE-NACL 50-0.9 MG/10ML-% IV SOSY
PREFILLED_SYRINGE | INTRAVENOUS | Status: DC | PRN
  Administered 2023-03-14 (×2): 5 mg via INTRAVENOUS

## 2023-03-14 MED ORDER — IOHEXOL 300 MG/ML  SOLN
INTRAMUSCULAR | Status: DC | PRN
  Administered 2023-03-14: 6 mL

## 2023-03-14 SURGICAL SUPPLY — 25 items
BAG COUNTER SPONGE SURGICOUNT (BAG) IMPLANT
BAG SPNG CNTER NS LX DISP (BAG)
BAG URO CATCHER STRL LF (MISCELLANEOUS) ×1 IMPLANT
BASKET ZERO TIP NITINOL 2.4FR (BASKET) IMPLANT
BSKT STON RTRVL ZERO TP 2.4FR (BASKET) ×1
CATH URETL OPEN END 6FR 70 (CATHETERS) IMPLANT
CLOTH BEACON ORANGE TIMEOUT ST (SAFETY) ×1 IMPLANT
GLOVE SURG LX STRL 7.5 STRW (GLOVE) ×1 IMPLANT
GOWN STRL REUS W/ TWL XL LVL3 (GOWN DISPOSABLE) ×1 IMPLANT
GOWN STRL REUS W/TWL XL LVL3 (GOWN DISPOSABLE) ×2
GUIDEWIRE STR DUAL SENSOR (WIRE) ×1 IMPLANT
GUIDEWIRE ZIPWRE .038 STRAIGHT (WIRE) IMPLANT
IV NS 1000ML (IV SOLUTION) ×1
IV NS 1000ML BAXH (IV SOLUTION) ×1 IMPLANT
IV NS IRRIG 3000ML ARTHROMATIC (IV SOLUTION) IMPLANT
KIT TURNOVER KIT A (KITS) IMPLANT
LASER FIB FLEXIVA PULSE ID 365 (Laser) IMPLANT
MANIFOLD NEPTUNE II (INSTRUMENTS) ×1 IMPLANT
PACK CYSTO (CUSTOM PROCEDURE TRAY) ×1 IMPLANT
SHEATH NAVIGATOR HD 12/14X36 (SHEATH) IMPLANT
STENT URET 6FRX26 CONTOUR (STENTS) IMPLANT
TRACTIP FLEXIVA PULS ID 200XHI (Laser) IMPLANT
TRACTIP FLEXIVA PULSE ID 200 (Laser) ×1
TUBING CONNECTING 10 (TUBING) ×1 IMPLANT
TUBING UROLOGY SET (TUBING) ×1 IMPLANT

## 2023-03-14 NOTE — Op Note (Signed)
Preoperative diagnosis: Left renal and ureteral calculi  Postoperative diagnosis: Left renal calculi  Procedure:  Cystoscopy Left ureteroscopy and stone removal Ureteroscopic laser lithotripsy Left ureteral stent placement (6 x 26 - string) Left retrograde pyelography with interpretation  Surgeon: Moody Bruins. M.D.  Anesthesia: General  Complications: None  Intraoperative findings: Left retrograde pyelography demonstrated a filling defect within the upper pole and lower pole calyx consistent with the patient's known calculus without other abnormalities noted.  EBL: Minimal  Specimens: Left renal calculi  Disposition of specimens: [facility] for stone analysis  Indication: Derek [name]  is a 77 y.o. patient with urolithiasis. He recently presented with a 16 mm proximal ureteral and 9 mm renal stone on the left. After reviewing the management options for treatment, they elected to proceed with the above surgical procedure(s). We have discussed the potential benefits and risks of the procedure, side effects of the proposed treatment, the likelihood of the patient achieving the goals of the procedure, and any potential problems that might occur during the procedure or recuperation. Informed consent has been obtained.  Description of procedure:  The patient was taken to the operating room and general anesthesia was induced.  The patient was placed in the dorsal lithotomy position, prepped and draped in the usual sterile fashion, and preoperative antibiotics were administered. A preoperative time-out was performed.   Cystourethroscopy was performed.  The patient's urethra was examined and was normal. The bladder was then systematically examined in its entirety. There was no evidence for any bladder tumors, stones, or other mucosal pathology.    Attention then turned to the left ureteral orifice and a ureteral catheter was used to intubate the ureteral  orifice.  Omnipaque contrast was injected through the ureteral catheter and a retrograde pyelogram was performed with findings as dictated above.  A 0.38 sensor guidewire was then advanced up the left ureter into the renal pelvis under fluoroscopic guidance.  A 12/14 Fr ureteral access sheath was then advanced over the guide wire. The digital flexible ureteroscope was then advanced through the access sheath into the ureter next to the guidewire and the calculi were identified and was located in the upper pole and lower pole calyx respectively.   The stones were then each fragmented with the 200 micron holmium laser fiber on a setting of 0.2 J and frequency of 70 Hz.   All sizable stones were then removed with a zero tip nitinol basket.  Reinspection of the ureter/renal pelvis revealed no remaining visible stones or fragments of significant size.   The safety wire was then replaced and the access sheath removed.  The guidewire was backloaded through the cystoscope and a ureteral stent was advance over the wire using Seldinger technique.  The stent was positioned appropriately under fluoroscopic and cystoscopic guidance.  The wire was then removed with an adequate stent curl noted in the renal pelvis as well as in the bladder.  A string tether was secured to the penis.  The bladder was then emptied and the procedure ended.  The patient appeared to tolerate the procedure well and without complications.  The patient was able to be awakened and transferred to the recovery unit in satisfactory condition.   Moody Bruins MD

## 2023-03-14 NOTE — Anesthesia Procedure Notes (Signed)
Procedure Name: LMA Insertion Date/Time: 03/14/2023 3:12 PM  Performed by: Sindy Guadeloupe, CRNAPre-anesthesia Checklist: Patient identified, Emergency Drugs available, Suction available, Patient being monitored and Timeout performed Patient Re-evaluated:Patient Re-evaluated prior to induction Oxygen Delivery Method: Circle system utilized Preoxygenation: Pre-oxygenation with 100% oxygen Induction Type: IV induction Ventilation: Mask ventilation without difficulty LMA: LMA inserted LMA Size: 4.0 Number of attempts: 1 Tube secured with: Tape Dental Injury: Teeth and Oropharynx as per pre-operative assessment

## 2023-03-14 NOTE — Discharge Instructions (Addendum)
[facility] [phone] Post Ureteroscopy With or Without Stent Instructions  Definitions:  Ureter: The duct that transports urine from the kidney to the bladder. Stent:   A plastic hollow tube that is placed into the ureter, from the kidney to the bladder to prevent the ureter from swelling shut.  GENERAL INSTRUCTIONS:  Despite the fact that no skin incisions were used, the area around the ureter and bladder is raw and irritated. The stent is a foreign body which will further irritate the bladder wall. This irritation is manifested by increased frequency of urination, both day and night, and by an increase in the urge to urinate. In some, the urge to urinate is present almost always. Sometimes the urge is strong enough that you may not be able to stop yourself from urinating. The only real cure is to remove the stent and then give time for the bladder wall to heal which can't be done until the danger of the ureter swelling shut has passed, which varies.  You may see some blood in your urine while the stent is in place and a few days afterwards. Do not be alarmed, even if the urine was clear for a while. Get off your feet and drink lots of fluids until clearing occurs. If you start to pass clots or don't improve, call us.  You can remove your ureteral stent on Friday, 03/18/23, in the morning. Do so by gently pulling on the black string coming out of the urethra. The stent is completely removed when you see two curls, one at either end, of the blue plastic tube.  DIET: You may return to your normal diet immediately. Because of the raw surface of your bladder, alcohol, spicy foods, acid type foods and drinks with caffeine may cause irritation or frequency and should be used in moderation. To keep your urine flowing freely and to avoid constipation, drink plenty of fluids during the day ( 8-10 glasses ). Tip: Avoid cranberry juice because it is very acidic.  ACTIVITY: Your  physical activity doesn't need to be restricted. However, if you are very active, you may see some blood in your urine. We suggest that you reduce your activity under these circumstances until the bleeding has stopped.  BOWELS: It is important to keep your bowels regular during the postoperative period. Straining with bowel movements can cause bleeding. A bowel movement every other day is reasonable. Use a mild laxative if needed, such as Milk of Magnesia 2-3 tablespoons, or 2 Dulcolax tablets. Call if you continue to have problems. If you have been taking narcotics for pain, before, during or after your surgery, you may be constipated. Take a laxative if necessary.   MEDICATION: You should resume your pre-surgery medications unless told not to. In addition you will often be given an antibiotic to prevent infection. These should be taken as prescribed until the bottles are finished unless you are having an unusual reaction to one of the drugs.  PROBLEMS YOU SHOULD REPORT TO [place]: Fevers over 100.5 Fahrenheit. Heavy bleeding, or clots ( See above notes about blood in urine ). Inability to urinate. Drug reactions ( hives, rash, nausea, vomiting, diarrhea ). Severe burning or pain with urination that is not improving.  FOLLOW-UP: You will need a follow-up appointment to monitor your progress. Call for this appointment at the number listed above. Usually the first appointment will be about three to fourteen days after your surgery.

## 2023-03-14 NOTE — Transfer of Care (Signed)
Immediate Anesthesia Transfer of Care Note  Patient: Derek [name]  Procedure(s) Performed: CYSTOSCOPY/LEFT RETROGRADE PYELOGRAM/ LEFT URETEROSCOPY/HOLMIUM LASER/LEFT URETERAL STENT PLACEMENT (Left: Bladder)  Patient Location: PACU  Anesthesia Type:General  Level of Consciousness: drowsy and patient cooperative  Airway & Oxygen Therapy: Patient Spontanous Breathing and Patient connected to face mask oxygen  Post-op Assessment: Report given to RN and Post -op Vital signs reviewed and stable  Post vital signs: Reviewed and stable  Last Vitals:  Vitals Value Taken Time  BP 147/80 03/14/23 1647  Temp    Pulse 61 03/14/23 1649  Resp 13 03/14/23 1649  SpO2 100 % 03/14/23 1649  Vitals shown include unvalidated device data.  Last Pain:  Vitals:   03/14/23 1317  TempSrc: Oral         Complications: No notable events documented.

## 2023-03-14 NOTE — Anesthesia Postprocedure Evaluation (Signed)
Anesthesia Post Note  Patient: Derek [name]  Procedure(s) Performed: CYSTOSCOPY/LEFT RETROGRADE PYELOGRAM/ LEFT URETEROSCOPY/HOLMIUM LASER/LEFT URETERAL STENT PLACEMENT (Left: Bladder)     Patient location during evaluation: PACU Anesthesia Type: General Level of consciousness: awake Pain management: pain level controlled Vital Signs Assessment: post-procedure vital signs reviewed and stable Respiratory status: spontaneous breathing, nonlabored ventilation and respiratory function stable Cardiovascular status: blood pressure returned to baseline and stable Postop Assessment: no apparent nausea or vomiting Anesthetic complications: no  No notable events documented.  Last Vitals:  Vitals:   03/14/23 1726 03/14/23 1738  BP:  (!) 159/89  Pulse: 61 65  Resp: 18   Temp: 37 C 37 C  SpO2: 99% 98%    Last Pain:  Vitals:   03/14/23 1738  TempSrc:   PainSc: 0-No pain                 Linton Rump

## 2023-03-14 NOTE — Interval H&P Note (Signed)
History and Physical Interval Note:  03/14/2023 2:18 PM  Derek [name]  has presented today for surgery, with the diagnosis of LEFT URETERAL AND RENAL CALCULI.  The various methods of treatment have been discussed with the patient and family. After consideration of risks, benefits and other options for treatment, the patient has consented to  Procedure(s) with comments: CYSTOSCOPY/LEFT RETROGRADE PYELOGRAM/ LEFT URETEROSCOPY/HOLMIUM LASER/LEFT URETERAL STENT PLACEMENT (Left) - 60 MINUTES NEEDED FOR CASE as a surgical intervention.  The patient's history has been reviewed, patient examined, no change in status, stable for surgery.  I have reviewed the patient's chart and labs.  Questions were answered to the patient's satisfaction.     [employer]

## 2023-03-15 ENCOUNTER — Encounter (HOSPITAL_COMMUNITY): Payer: Self-pay | Admitting: Urology

## 2023-07-28 ENCOUNTER — Emergency Department (HOSPITAL_COMMUNITY)
Admission: EM | Admit: 2023-07-28 | Discharge: 2023-07-28 | Disposition: A | Discharge status: Home / Self Care | Payer: No Typology Code available for payment source | Attending: Emergency Medicine | Admitting: Emergency Medicine

## 2023-07-28 ENCOUNTER — Other Ambulatory Visit: Payer: Self-pay

## 2023-07-28 DIAGNOSIS — Z7984 Long term (current) use of oral hypoglycemic drugs: Secondary | ICD-10-CM | POA: Insufficient documentation

## 2023-07-28 DIAGNOSIS — Z79899 Other long term (current) drug therapy: Secondary | ICD-10-CM | POA: Diagnosis not present

## 2023-07-28 DIAGNOSIS — R001 Bradycardia, unspecified: Secondary | ICD-10-CM | POA: Insufficient documentation

## 2023-07-28 DIAGNOSIS — I10 Essential (primary) hypertension: Secondary | ICD-10-CM | POA: Diagnosis not present

## 2023-07-28 DIAGNOSIS — Z7982 Long term (current) use of aspirin: Secondary | ICD-10-CM | POA: Insufficient documentation

## 2023-07-28 DIAGNOSIS — I2510 Atherosclerotic heart disease of native coronary artery without angina pectoris: Secondary | ICD-10-CM | POA: Diagnosis not present

## 2023-07-28 DIAGNOSIS — R531 Weakness: Secondary | ICD-10-CM | POA: Insufficient documentation

## 2023-07-28 DIAGNOSIS — E119 Type 2 diabetes mellitus without complications: Secondary | ICD-10-CM | POA: Diagnosis not present

## 2023-07-28 DIAGNOSIS — I959 Hypotension, unspecified: Secondary | ICD-10-CM | POA: Diagnosis not present

## 2023-07-28 LAB — BASIC METABOLIC PANEL
Anion gap: 7 (ref 5–15)
Anion gap: 7 (ref 5–15)
BUN: 12 mg/dL (ref 8–23)
BUN: 11 mg/dL (ref 8–23)
CO2: 25 mmol/L (ref 22–32)
CO2: 25 mmol/L (ref 22–32)
Calcium: 8.7 mg/dL — ABNORMAL LOW (ref 8.9–10.3)
Calcium: 9 mg/dL (ref 8.9–10.3)
Chloride: 104 mmol/L (ref 98–111)
Chloride: 105 mmol/L (ref 98–111)
Creatinine, Ser: 1.05 mg/dL (ref 0.61–1.24)
Creatinine, Ser: 0.95 mg/dL (ref 0.61–1.24)
GFR, Estimated: >60 mL/min (ref >60)
GFR, Estimated: >60 mL/min (ref >60)
Glucose, Bld: 110 mg/dL — ABNORMAL HIGH (ref 70–99)
Glucose, Bld: 90 mg/dL (ref 70–99)
Potassium: 5.6 mmol/L — ABNORMAL HIGH (ref 3.5–5.1)
Potassium: 4.2 mmol/L (ref 3.5–5.1)
Sodium: 136 mmol/L (ref 135–145)
Sodium: 137 mmol/L (ref 135–145)

## 2023-07-28 LAB — CBC
HCT: 42.2 % (ref 39.0–52.0)
Hemoglobin: 14.3 g/dL (ref 13.0–17.0)
MCH: 36.1 pg — ABNORMAL HIGH (ref 26.0–34.0)
MCHC: 33.9 g/dL (ref 30.0–36.0)
MCV: 106.6 fL — ABNORMAL HIGH (ref 80.0–100.0)
Platelets: 214 10*3/uL (ref 150–400)
RBC: 3.96 MIL/uL — ABNORMAL LOW (ref 4.22–5.81)
RDW: 15.2 % (ref 11.5–15.5)
WBC: 6.9 10*3/uL (ref 4.0–10.5)
nRBC: 0.4 % — ABNORMAL HIGH (ref 0.0–0.2)

## 2023-07-28 LAB — CBG MONITORING, ED: Glucose-Capillary: 101 mg/dL — ABNORMAL HIGH (ref 70–99)

## 2023-07-28 NOTE — ED Provider Notes (Signed)
Accepted handoff at shift change from [facility]. Please see prior provider note for more detail.   Briefly: Patient is 77 y.o.   DDX: concern for Had covid and flu shot 4 days ago -- feeling blase, body aches. Mildly bradycardic. Initial potassium elevated, suspect hemolysis, recheck BMP and then can go home if potassium improved.   Plan: Repeat BMP is unremarkable, patient remains feeling okay despite some bodyaches.  He has been persistently bradycardic in the emergency department, given he is on metoprolol and discussed that he should speak with his cardiologist about potentially decreasing his dosage or switching to a different blood pressure medication, no additional treatment needed in the emergency department at this time.  Patient discharged in stable condition.      RISR  EDTHIS    Olene Floss, PA-C 07/28/23 1808    Franne Forts, DO 07/29/23 0202

## 2023-07-28 NOTE — ED Triage Notes (Signed)
Pt arrives via ems from home for c/o gen weakness, lethargic. Oriented x 4 per ems. Flu and covid shot a couple days ago. Hr 40-50. Other vss. Pt does have home health services.

## 2023-07-28 NOTE — ED Provider Notes (Signed)
[facility] EMERGENCY DEPARTMENT AT [facility] Provider Note   CSN: [id number] Arrival date & time: 07/28/23  1313     History  Chief Complaint  Patient presents with   Weakness   Bradycardia    Derek [name] is a 77 y.o. male.  The history is provided by the patient, the spouse and medical records. No language interpreter was used.  Weakness    77 year old male with multiple comorbidities which include hypertension, CAD, prior stroke, diabetes, malnutrition's, recurrent syncope brought here via EMS from home for evaluation of generalized weakness.  History obtained through patient and through wife who is at bedside.  Patient has a home health nurse that come and visit him on a regular basis.  4 days ago she came out to check on him and check his vital sign and wife report everything was fine.  Patient did receive both flu shot and a COVID shot on the same date.  On subsequent days when she came out to check on him she noticed that patient is weaker than usual with generalized weakness and less active.  This morning patient was complaining of generalized bodyaches and eating less therefore wife was concerned and called EMS.  At this time patient is without any specific complaint.  He does not endorse any headache runny nose sneezing or coughing no chest pain or trouble breathing.  He did endorse some mild abdominal discomfort and some diarrhea several days ago but that has since resolved.  No urinary complaint.  He is overall without complaint.  EMS did note that patient was bradycardic.  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  Home Medications Prior to Admission medications   Medication Sig Start Date End Date Taking? Authorizing Provider  allopurinol (ZYLOPRIM) 300 MG tablet Take 300 mg by mouth daily.    [provider]  aspirin EC 81 MG tablet Take 81 mg by mouth daily. Swallow whole.    [provider]  cholecalciferol (VITAMIN D3) 25 MCG (1000 UNIT) tablet Take 1,000 Units by mouth daily.    [provider]  feeding supplement (BOOST HIGH PROTEIN) LIQD Take 1 Container by mouth daily.    [provider]  FLUoxetine (PROZAC) 20 MG capsule Take 20 mg by mouth daily.    [provider]  furosemide (LASIX) 20 MG tablet Take 0.5 tablets (10 mg total) by mouth daily as needed for fluid or edema. Patient taking differently: Take 20 mg by mouth daily. 03/05/22   Leatha Gilding, MD  metFORMIN (GLUCOPHAGE-XR) 500 MG 24 hr tablet Take 1,000 mg by mouth daily with breakfast. 08/03/21   [provider]  methotrexate (RHEUMATREX) 2.5 MG tablet Take 10 mg by mouth 2 (two) times a week. Friday & Saturday    [provider]  metoprolol succinate (TOPROL-XL) 25 MG 24 hr tablet Take 12.5 mg by mouth every morning. 02/08/20   [provider]  rosuvastatin (CRESTOR) 20 MG tablet Take 20 mg by mouth daily. 12/23/21   [provider]      Allergies    Patient has no known allergies.    Review of Systems   Review of Systems  Neurological:  Positive for weakness.  All other systems reviewed and are negative.   Physical Exam Updated Vital Signs BP 122/76   Pulse (!) 50   Temp 97.6 F (36.4 C) (Oral)   Resp 14   Ht  5\' 7"  (1.702 m)   Wt 74.8 kg   SpO2 96%   BMI 25.84 kg/m  Physical Exam Vitals and nursing note reviewed.  Constitutional:      General: He is not in acute distress.    Appearance: He is well-developed.  HENT:     Head: Atraumatic.  Eyes:     Conjunctiva/sclera: Conjunctivae normal.  Cardiovascular:     Rate and Rhythm: Regular rhythm. Bradycardia present.     Pulses: Normal pulses.     Heart sounds: Normal heart sounds.  Pulmonary:     Effort: Pulmonary effort is normal.     Breath sounds: Normal breath sounds.  Abdominal:     Palpations: Abdomen is soft.     Tenderness: There is no abdominal  tenderness.  Musculoskeletal:     Cervical back: Neck supple.     Comments: Equal strength throughout all 4 extremities with poor effort.  Skin:    Findings: No rash.  Neurological:     Mental Status: He is alert.     Comments: Alert and oriented x 3.  Psychiatric:        Mood and Affect: Mood normal.     ED Results / Procedures / Treatments   Labs (all labs ordered are listed, but only abnormal results are displayed) Labs Reviewed  BASIC METABOLIC PANEL - Abnormal; Notable for the following components:      Result Value   Potassium 5.6 (*)    Glucose, Bld 110 (*)    Calcium 8.7 (*)    All other components within normal limits  CBC - Abnormal; Notable for the following components:   RBC 3.96 (*)    MCV 106.6 (*)    MCH 36.1 (*)    nRBC 0.4 (*)    All other components within normal limits  CBG MONITORING, ED - Abnormal; Notable for the following components:   Glucose-Capillary 101 (*)    All other components within normal limits  URINALYSIS, ROUTINE W REFLEX MICROSCOPIC  BASIC METABOLIC PANEL    EKG EKG Interpretation Date/Time:  Thursday July 28 2023 13:45:04 EDT Ventricular Rate:  52 PR Interval:  205 QRS Duration:  93 QT Interval:  472 QTC Calculation: 439 R Axis:   -63  Text Interpretation: Sinus rhythm Inferior infarct, old Anterior infarct, old rate slower than prior 5/24 Confirmed by Meridee Score [phone]) on 07/28/2023 1:56:06 PM  Radiology No results found.  Procedures Procedures    Medications Ordered in ED Medications - No data to display  ED Course/ Medical Decision Making/ A&P Clinical Course as of 07/28/23 1508  Thu Jul 28, 2023  3562 77 year old male here with general weakness.  Just had a COVID shot.  Getting lab work EKG urinalysis.  Disposition per results of testing. [MB]  1502 Had covid and flu shot 4 days ago -- feeling blase, body aches. Mildly bradycardic. Initial potassium elevated, suspect hemolysis, recheck BMP and then can  go home if potassium improved.  [CP]    Clinical Course User Index [CP] Olene Floss, PA-C [MB] Terrilee Files, MD                                 Medical Decision Making Amount and/or Complexity of Data Reviewed Labs: ordered.   BP 122/76   Pulse (!) 50   Temp 97.6 F (36.4 C) (Oral)   Resp 14   Ht 5\' 7"  (1.702 m)  Wt 74.8 kg   SpO2 96%   BMI 25.84 kg/m   76:60 PM  77 year old male with multiple comorbidities which include hypertension, CAD, prior stroke, diabetes, malnutrition's, recurrent syncope brought here via EMS from home for evaluation of generalized weakness.  History obtained through patient and through wife who is at bedside.  Patient has a home health nurse that come and visit him on a regular basis.  4 days ago she came out to check on him and check his vital sign and wife report everything was fine.  Patient did receive both flu shot and a COVID shot on the same date.  On subsequent days when she came out to check on him she noticed that patient is weaker than usual with generalized weakness and less active.  This morning patient was complaining of generalized bodyaches and eating less therefore wife was concerned and called EMS.  At this time patient is without any specific complaint.  He does not endorse any headache runny nose sneezing or coughing no chest pain or trouble breathing.  He did endorse some mild abdominal discomfort and some diarrhea several days ago but that has since resolved.  No urinary complaint.  He is overall without complaint.  EMS did note that patient was bradycardic.  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  On exam patient is resting comfortably in bed appears to be in no acute discomfort.  Head is normocephalic, atraumatic, heart with mild bradycardia, lungs clear abdomen soft nontender patient has equal strength throughout all 4 extremities with poor effort.  He is alert and  oriented x 3.  Vital signs notable for heart rate of 50, EKG shows sinus bradycardia without concerning ischemic changes.  -Labs ordered, independently viewed and interpreted by me.  Labs remarkable for K+ 5.6  however hemolysis can cause abnormal value.  Will recheck BMP -The patient was maintained on a cardiac monitor.  I personally viewed and interpreted the cardiac monitored which showed an underlying rhythm of: sinus bradycardia -Imaging including head CT considered but felt low yield -This patient presents to the ED for concern of weakness, this involves an extensive number of treatment options, and is a complaint that carries with it a high risk of complications and morbidity.  The differential diagnosis includes vaccination side effect, covid, flu, rsv, anemia, electrolytes imbalance -Co morbidities that complicate the patient evaluation includes HTN, DJD, prediabetes, stroke -Treatment includes monitoring -Reevaluation of the patient after these medicines showed that the patient improved -PCP office notes or outside notes reviewed -Discussion with oncoming provider who will f/u on labs and reassess -Escalation to admission/observation considered: dispo pending         Final Clinical Impression(s) / ED Diagnoses Final diagnoses:  None    Rx / DC Orders ED Discharge Orders     None         Fayrene Helper, PA-C 07/28/23 1512    Terrilee Files, MD 07/29/23 1034

## 2023-07-28 NOTE — Discharge Instructions (Signed)
As we discussed your workup today was reassuring, with your low heart rate and may be worthwhile to talk to your cardiologist about decreasing your metoprolol medication as this can cause your heart rate to be slow.  They may consider putting you on a different blood pressure medication that will not lower your heart rate so significantly.  Otherwise no additional treatment needed based on your results in the emergency department today.

## 2023-07-31 NOTE — Plan of Care (Signed)
[facility] Tonsillectomy/Adenoidectomy, Postoperative PEDS care plan entered in error.

## 2023-12-06 DIAGNOSIS — M1A09X0 Idiopathic chronic gout, multiple sites, without tophus (tophi): Secondary | ICD-10-CM | POA: Diagnosis not present

## 2023-12-06 DIAGNOSIS — M0579 Rheumatoid arthritis with rheumatoid factor of multiple sites without organ or systems involvement: Secondary | ICD-10-CM | POA: Diagnosis not present

## 2023-12-06 DIAGNOSIS — E663 Overweight: Secondary | ICD-10-CM | POA: Diagnosis not present

## 2023-12-06 DIAGNOSIS — Z6827 Body mass index (BMI) 27.0-27.9, adult: Secondary | ICD-10-CM | POA: Diagnosis not present

## 2024-02-02 IMAGING — DX DG HIP (WITH OR WITHOUT PELVIS) 2-3V*L*
3 series · 3 of 3 positions shown · non-contrast
Comparison: Left hip radiographs 02/11/2022

CLINICAL DATA: Hip fracture.

EXAM:
DG HIP (WITH OR WITHOUT PELVIS) 2-3V LEFT

[pelvis ap]
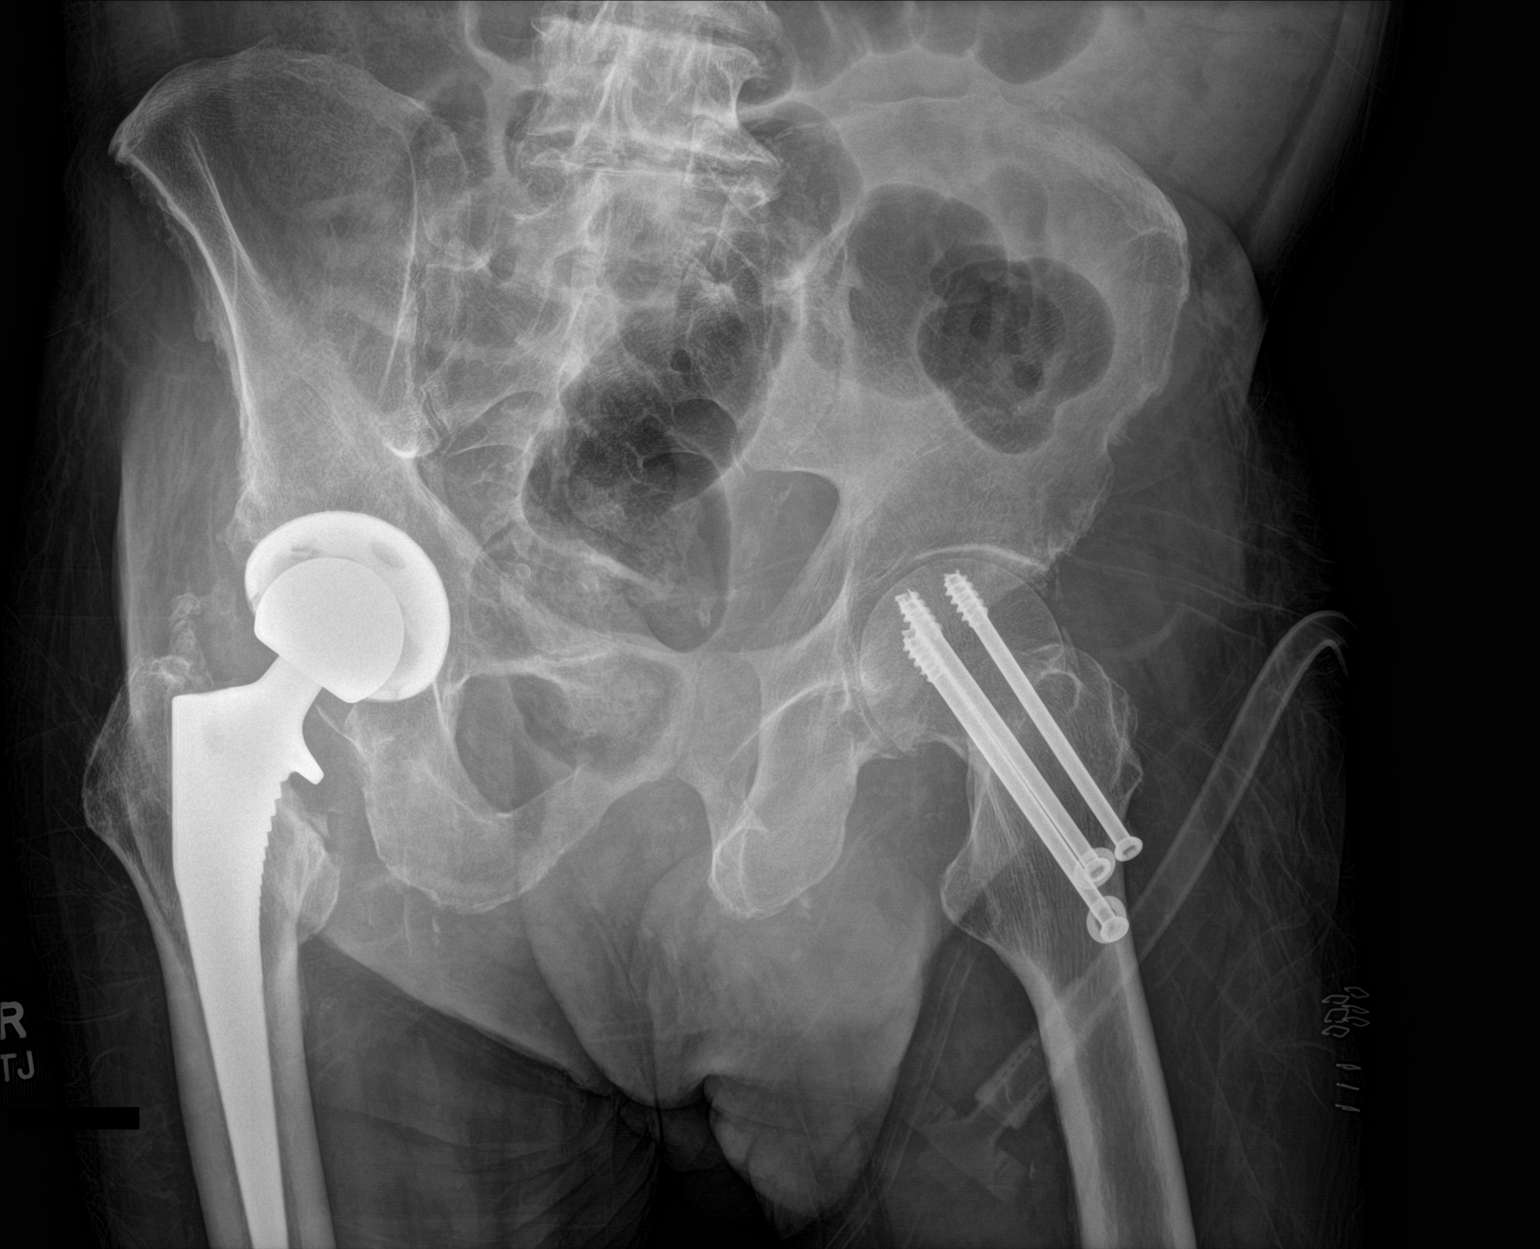

[hip ap]
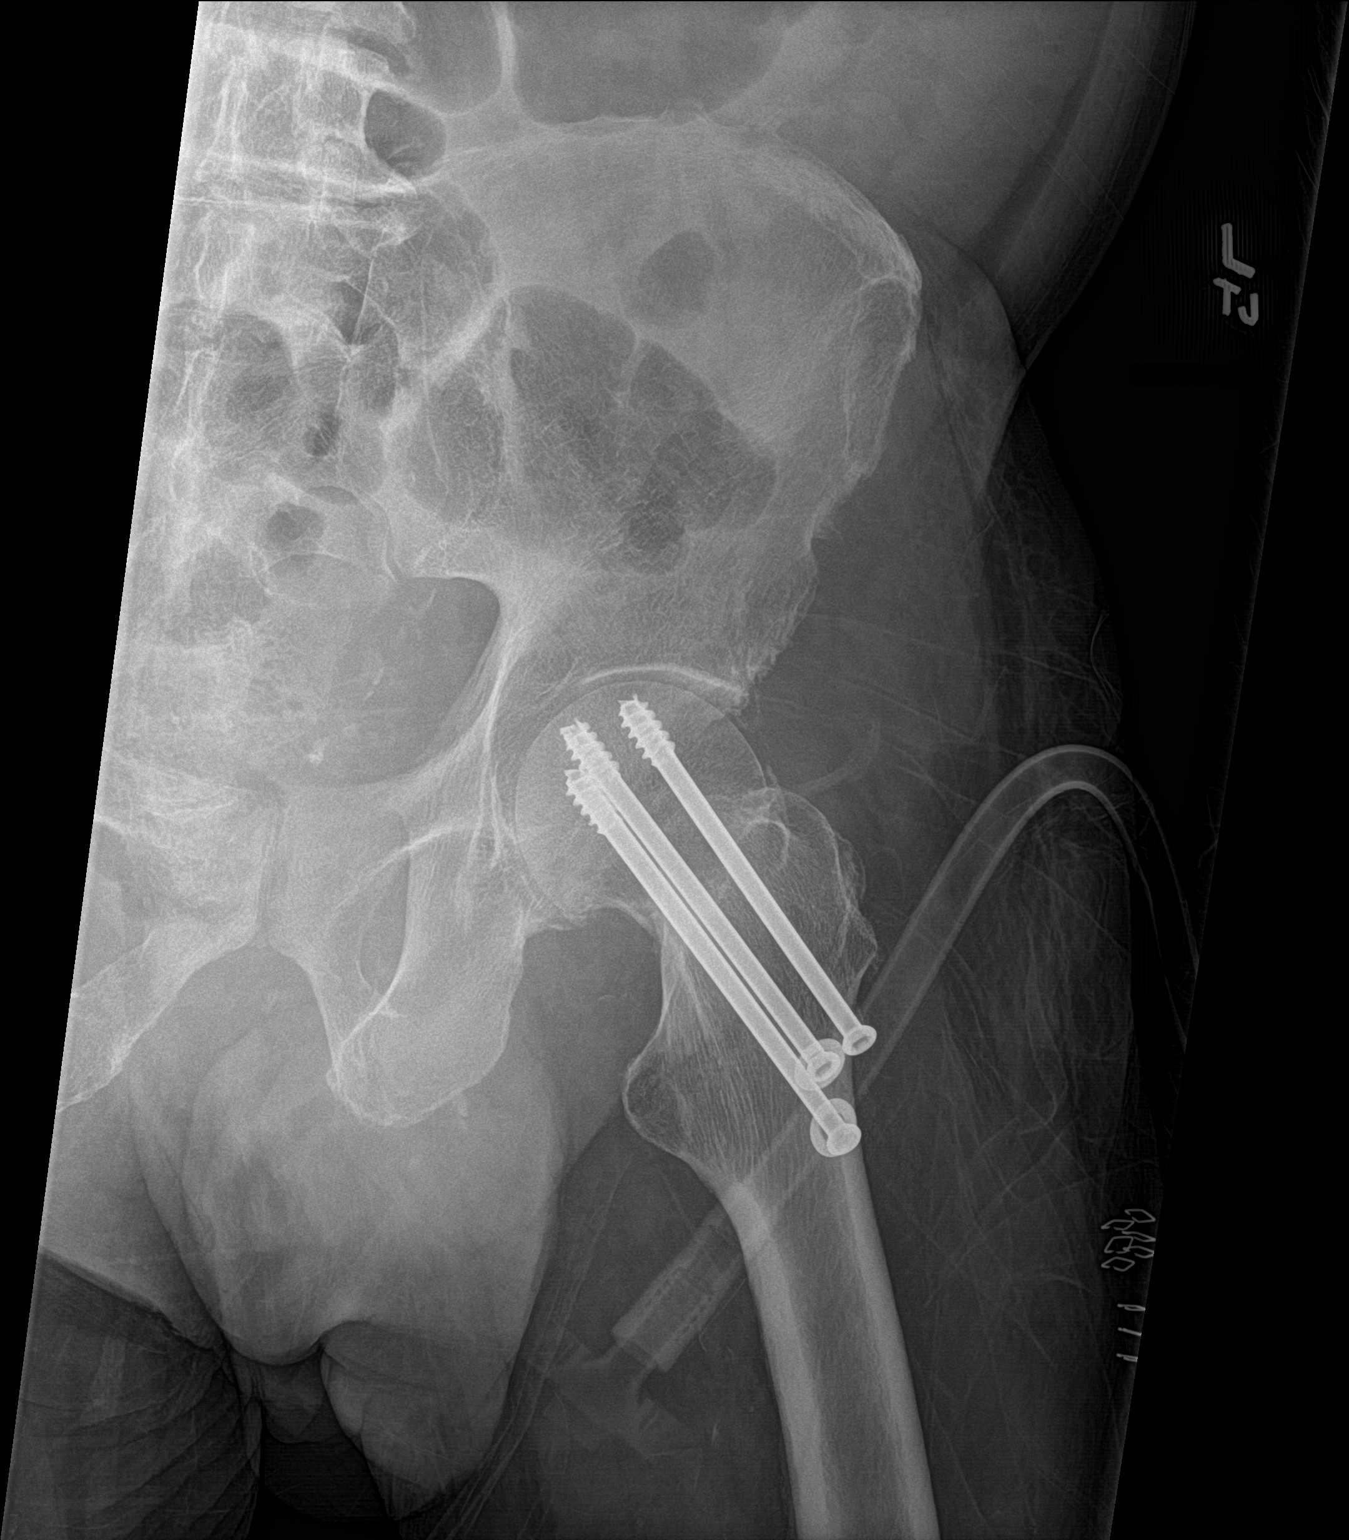

[hip lat]
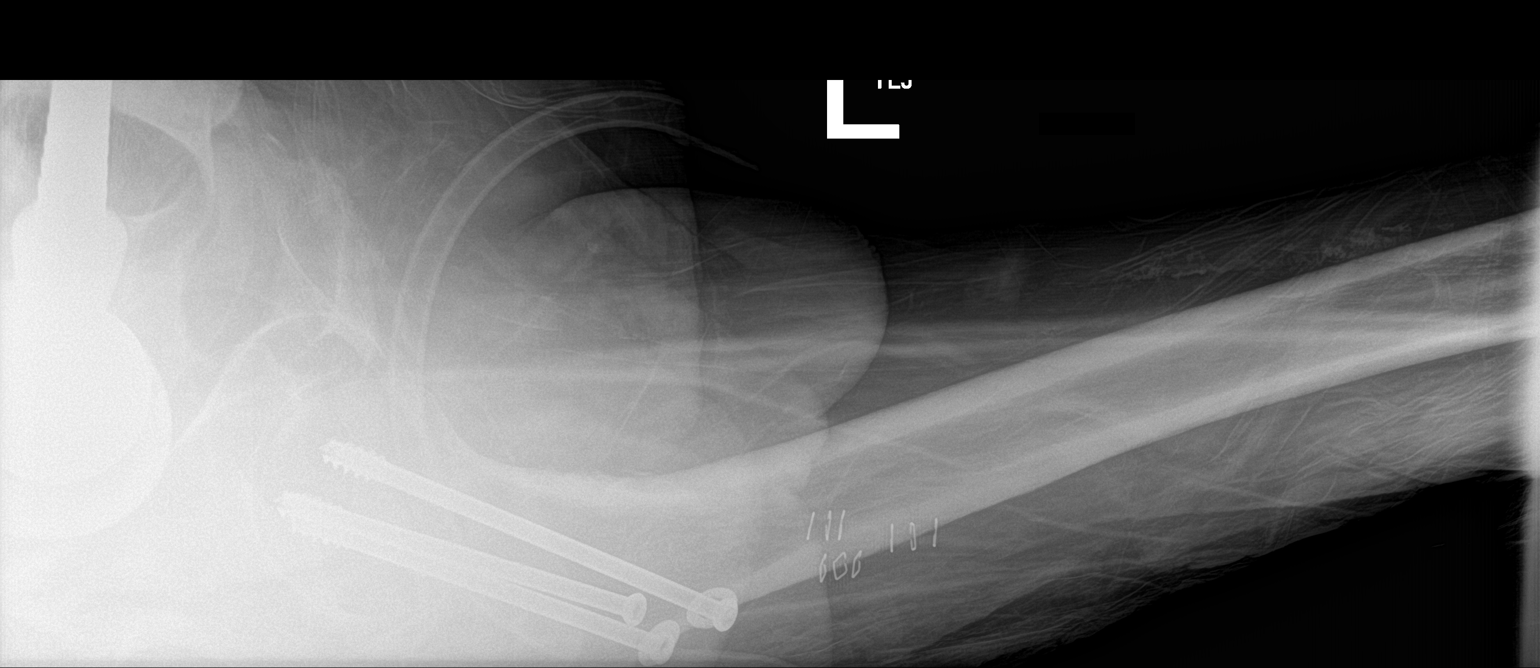

[3 of 3 positions shown; findings below may reference images not displayed]

FINDINGS: Interval ORIF of the previously seen left proximal femoral
subcapital fracture with 3 longitudinal screws. Normal alignment.
Moderate left femoroacetabular joint space narrowing. Status post
total right hip arthroplasty. No perihardware lucency is seen to
indicate hardware failure or loosening. Diffuse decreased bone
mineralization. Left hip lateral surgical skin staples. Left hip
surgical drain.
IMPRESSION: Interval ORIF of the proximal left femur with normal alignment.
